# Patient Record
Sex: Male | Born: 1950 | Race: Black or African American | Hispanic: No | Marital: Single | Smoking: Current every day smoker
Health system: Southern US, Community
[De-identification: ages and names within clinical notes are randomized; demographics above are authoritative.]

## PROBLEM LIST (undated history)

## (undated) DIAGNOSIS — I10 Essential (primary) hypertension: Secondary | ICD-10-CM

## (undated) DIAGNOSIS — R195 Other fecal abnormalities: Secondary | ICD-10-CM

## (undated) DIAGNOSIS — F1090 Alcohol use, unspecified, uncomplicated: Secondary | ICD-10-CM

## (undated) DIAGNOSIS — R001 Bradycardia, unspecified: Secondary | ICD-10-CM

## (undated) DIAGNOSIS — K6389 Other specified diseases of intestine: Secondary | ICD-10-CM

## (undated) DIAGNOSIS — I351 Nonrheumatic aortic (valve) insufficiency: Secondary | ICD-10-CM

## (undated) DIAGNOSIS — A159 Respiratory tuberculosis unspecified: Secondary | ICD-10-CM

## (undated) DIAGNOSIS — N189 Chronic kidney disease, unspecified: Secondary | ICD-10-CM

## (undated) DIAGNOSIS — I714 Abdominal aortic aneurysm, without rupture, unspecified: Secondary | ICD-10-CM

## (undated) DIAGNOSIS — Z789 Other specified health status: Secondary | ICD-10-CM

## (undated) HISTORY — DX: Bradycardia, unspecified: R00.1

## (undated) HISTORY — PX: HERNIA REPAIR: SHX51

---

## 2002-03-05 ENCOUNTER — Emergency Department (HOSPITAL_COMMUNITY): Admission: EM | Admit: 2002-03-05 | Discharge: 2002-03-05 | Payer: Self-pay | Admitting: Emergency Medicine

## 2004-02-13 ENCOUNTER — Emergency Department (HOSPITAL_COMMUNITY): Admission: EM | Admit: 2004-02-13 | Discharge: 2004-02-14 | Payer: Self-pay | Admitting: Emergency Medicine

## 2005-11-06 ENCOUNTER — Emergency Department (HOSPITAL_COMMUNITY): Admission: EM | Admit: 2005-11-06 | Discharge: 2005-11-06 | Payer: Self-pay | Admitting: Emergency Medicine

## 2006-10-23 ENCOUNTER — Emergency Department (HOSPITAL_COMMUNITY): Admission: EM | Admit: 2006-10-23 | Discharge: 2006-10-23 | Payer: Self-pay | Admitting: Emergency Medicine

## 2006-10-25 ENCOUNTER — Observation Stay (HOSPITAL_COMMUNITY): Admission: RE | Admit: 2006-10-25 | Discharge: 2006-10-26 | Payer: Self-pay | Admitting: General Surgery

## 2006-10-25 ENCOUNTER — Encounter (INDEPENDENT_AMBULATORY_CARE_PROVIDER_SITE_OTHER): Payer: Self-pay | Admitting: General Surgery

## 2006-10-25 HISTORY — PX: HERNIA REPAIR: SHX51

## 2007-09-11 ENCOUNTER — Emergency Department (HOSPITAL_COMMUNITY): Admission: EM | Admit: 2007-09-11 | Discharge: 2007-09-12 | Payer: Self-pay | Admitting: Emergency Medicine

## 2007-09-17 ENCOUNTER — Emergency Department (HOSPITAL_COMMUNITY): Admission: EM | Admit: 2007-09-17 | Discharge: 2007-09-17 | Payer: Self-pay | Admitting: Emergency Medicine

## 2007-09-20 ENCOUNTER — Ambulatory Visit (HOSPITAL_COMMUNITY): Admission: RE | Admit: 2007-09-20 | Discharge: 2007-09-20 | Payer: Self-pay | Admitting: Family Medicine

## 2007-09-27 ENCOUNTER — Ambulatory Visit (HOSPITAL_COMMUNITY): Admission: RE | Admit: 2007-09-27 | Discharge: 2007-09-27 | Payer: Self-pay | Admitting: Family Medicine

## 2008-05-26 ENCOUNTER — Emergency Department (HOSPITAL_COMMUNITY): Admission: EM | Admit: 2008-05-26 | Discharge: 2008-05-26 | Payer: Self-pay | Admitting: Emergency Medicine

## 2010-06-02 LAB — POCT CARDIAC MARKERS
CKMB, poc: 2.4 ng/mL (ref 1.0–8.0)
Myoglobin, poc: 121 ng/mL (ref 12–200)
Troponin i, poc: <0.05 ng/mL (ref 0.00–0.09)

## 2010-06-02 LAB — HEMOGLOBIN AND HEMATOCRIT, BLOOD
HCT: 44.2 % (ref 39.0–52.0)
Hemoglobin: 15.1 g/dL (ref 13.0–17.0)

## 2010-06-02 LAB — ETHANOL: Alcohol, Ethyl (B): 5 mg/dL (ref 0–10)

## 2010-07-06 NOTE — Op Note (Signed)
NAME:  [name], Fernando Dean NO.:  [id number]   MEDICAL RECORD NO.:  [id number]          PATIENT TYPE:  OBV   LOCATION:  A331                          FACILITY:  [facility]   PHYSICIAN:  Barbaraann Barthel, M.D. DATE OF BIRTH:  January 07, 1951   DATE OF PROCEDURE:  10/25/2006  DATE OF DISCHARGE:                               OPERATIVE REPORT   PREOPERATIVE DIAGNOSIS:  Left inguinal hernia.   POSTOPERATIVE DIAGNOSIS:  Left inguinal hernia.   PROCEDURE:  Left inguinal herniorrhaphy (modified McVay repair).   SPECIMEN:  Left inguinal hernia sac.   NOTE:  This is a 60 year old black male whom I had seen over a year ago  and noted that he had a left inguinal hernia.  I advised surgery.  He  refused at that time and appeared two days ago in the emergency room  with an incarcerated hernia that was reduced by the emergency room  physician.  We then saw him the following day and scheduled surgery for  the following day after that.  We discussed surgery with him in detail.  discussing complications not limited to but including bleeding,  infection, and recurrence, and the possible use of mesh. Informed  consent was obtained.   GROSS OPERATIVE FINDINGS:  The patient had a large left inguinal hernia  sac which was very scarred to the cord structures.  The small bowel was  returned to the abdominal cavity, as he had a previous incarceration.  We noted that this easily slid in and out of his hernia sac.   TECHNIQUE:  The patient was placed in the supine position after the  adequate administration of spinal anesthesia.  Prior to this, a Foley  catheter was aseptically inserted.  Incision was carried out between the  anterior-superior iliac spine and the pubic tubercle, through skin,  subcutaneous tissue, and Scarpa's layer down to the external oblique,  which was opened through the external ring.  The cord structures were  then dissected free through tedious dissection from a very  thickened,  scarred-up hernia sac.  This hernia sac was then doubly ligated with 2-0  Bralon, the redundant portion of which was sent as a specimen.  We then  sutured transversus abdominis and transversalis fascia to Cooper's  ligament and Poupart's ligament with interrupted 2-0 Bralon sutures,  tightening these after relaxing incision was carried out.  We then used  0.5% Sensorcaine to help with postoperative comfort.  We returned the  cord structures to their anatomic position and closed the external  oblique over them with a running 3-0 Polysorb suture.  The subcu was irrigated, and the skin was approximated with a stapling  device.  Prior to closure, all sponge, needle, and instrument counts  were found to be correct.  Estimated blood loss was minimal.  The  patient tolerated the procedure well and was taken to the recovery room  in satisfactory condition.      Barbaraann Barthel, M.D.  Electronically Signed     WB/MEDQ  D:  10/25/2006  T:  10/25/2006  Job:  [id number]

## 2010-12-03 LAB — CBC
HCT: 47.3
Hemoglobin: 15.9
MCHC: 33.6
MCV: 90.7
Platelets: 351
RBC: 5.22
RDW: 13.2
WBC: 8.7

## 2010-12-03 LAB — DIFFERENTIAL
Basophils Absolute: 0
Basophils Relative: 0
Eosinophils Absolute: 0.1
Eosinophils Relative: 1
Lymphocytes Relative: 19
Lymphs Abs: 1.7
Monocytes Absolute: 0.4
Monocytes Relative: 5
Neutro Abs: 6.5
Neutrophils Relative %: 75

## 2010-12-03 LAB — BASIC METABOLIC PANEL
BUN: 12
CO2: 27
Calcium: 10.1
Chloride: 103
Creatinine, Ser: 1.19
GFR calc Af Amer: >60
GFR calc non Af Amer: >60
Glucose, Bld: 130 — ABNORMAL HIGH
Potassium: 4.5
Sodium: 138

## 2011-07-20 ENCOUNTER — Other Ambulatory Visit: Payer: Self-pay | Admitting: Family Medicine

## 2011-07-20 DIAGNOSIS — N200 Calculus of kidney: Secondary | ICD-10-CM

## 2011-07-21 ENCOUNTER — Ambulatory Visit (HOSPITAL_COMMUNITY)
Admission: RE | Admit: 2011-07-21 | Discharge: 2011-07-21 | Disposition: A | Discharge status: Home / Self Care | Payer: Medicare Other | Source: Ambulatory Visit | Attending: Family Medicine | Admitting: Family Medicine

## 2011-07-21 DIAGNOSIS — N200 Calculus of kidney: Secondary | ICD-10-CM

## 2011-07-21 DIAGNOSIS — R1032 Left lower quadrant pain: Secondary | ICD-10-CM | POA: Insufficient documentation

## 2011-07-21 DIAGNOSIS — R3916 Straining to void: Secondary | ICD-10-CM | POA: Insufficient documentation

## 2011-10-11 ENCOUNTER — Ambulatory Visit: Payer: Medicare Other | Admitting: Urology

## 2020-10-14 ENCOUNTER — Ambulatory Visit: Payer: Self-pay | Admitting: Family Medicine

## 2020-12-24 DIAGNOSIS — R001 Bradycardia, unspecified: Secondary | ICD-10-CM | POA: Diagnosis not present

## 2020-12-24 DIAGNOSIS — Z0189 Encounter for other specified special examinations: Secondary | ICD-10-CM | POA: Diagnosis not present

## 2020-12-24 DIAGNOSIS — Z1211 Encounter for screening for malignant neoplasm of colon: Secondary | ICD-10-CM | POA: Diagnosis not present

## 2020-12-28 ENCOUNTER — Encounter (INDEPENDENT_AMBULATORY_CARE_PROVIDER_SITE_OTHER): Payer: Self-pay | Admitting: *Deleted

## 2021-01-11 ENCOUNTER — Encounter (INDEPENDENT_AMBULATORY_CARE_PROVIDER_SITE_OTHER): Payer: Self-pay | Admitting: *Deleted

## 2021-02-01 ENCOUNTER — Ambulatory Visit (INDEPENDENT_AMBULATORY_CARE_PROVIDER_SITE_OTHER): Payer: Medicare Other | Admitting: Gastroenterology

## 2021-02-01 ENCOUNTER — Other Ambulatory Visit: Payer: Self-pay

## 2021-02-01 ENCOUNTER — Encounter (INDEPENDENT_AMBULATORY_CARE_PROVIDER_SITE_OTHER): Payer: Self-pay | Admitting: Gastroenterology

## 2021-02-01 DIAGNOSIS — R195 Other fecal abnormalities: Secondary | ICD-10-CM

## 2021-02-01 DIAGNOSIS — Z136 Encounter for screening for cardiovascular disorders: Secondary | ICD-10-CM | POA: Insufficient documentation

## 2021-02-01 DIAGNOSIS — I10 Essential (primary) hypertension: Secondary | ICD-10-CM | POA: Insufficient documentation

## 2021-02-01 DIAGNOSIS — R001 Bradycardia, unspecified: Secondary | ICD-10-CM

## 2021-02-01 NOTE — Patient Instructions (Signed)
Cardiology referral for colonoscopy clearance given bradycardia, SOB and hypertension Patient will need to reach PCP due to uncontrolled hypertension - may need to be seen in the ED if presenting persistent headache, chest pain, changes in vision, worsening shortness of breath Schedule aortic US Perform blood workup

## 2021-02-01 NOTE — Progress Notes (Signed)
Fernando Dean, M.D. Gastroenterology & Hepatology [facility] [address] [place], [place] [address] Primary Care Physician: [name], FNP [place] [address]  Referring MD: PCP  Chief Complaint:  Positive FOBT  History of Present Illness: Fernando Dean is a 70 y.o. male with past medical history of chronic tobacco and alcohol abuse, who presents for evaluation of positive fecal occult blood test.  Patient reports that around June 2022 he had positive FOBT at his PCP office and was referred for further evaluation.  He denies having any complaints and states feeling well.  Denies having any episodes of melena, hematochezia, abdominal pain, nausea, vomiting, fever, chills, abdominal distention or changes in his weight.   Notably, he has been told his HR is low recently.  Reports that occasionally he has shortness of breath when he is lifting objects. Has presented some intermittent episodes of dizziness.  No chest pain or syncope.  Currently not taking any medications for HTN.  Last EGD: never Last Colonoscopy:never  FHx: neg for any gastrointestinal/liver disease, motherhad gallbladder cancer Social: smokes 1 pack a day, drinks 3-4 shots of vodka every day, smokes marijuana 2-3 times a week Surgical: hernia repair  Past Medical History: Past Medical History:  Diagnosis Date   Bradycardia     Past Surgical History: Past Surgical History:  Procedure Laterality Date   HERNIA REPAIR     2012    Family History: Family History  Problem Relation Age of Onset   Bladder Cancer Mother    Heart disease Father     Social History: Social History   Tobacco Use  Smoking Status Every Day   Packs/day: 1.00   Types: Cigarettes  Smokeless Tobacco Never   Social History   Substance and Sexual Activity  Alcohol Use Yes   Comment: daily liquor moderatly   Social History   Substance and Sexual  Activity  Drug Use Not on file    Allergies: Allergies  Allergen Reactions   Advil [Ibuprofen] Shortness Of Breath and Palpitations   Aleve [Naproxen] Shortness Of Breath   Nsaids Shortness Of Breath    Medications: Current Outpatient Medications  Medication Sig Dispense Refill   Multiple Vitamins-Minerals (MULTIVITAMIN WITH MINERALS) tablet Take 1 tablet by mouth daily.     No current facility-administered medications for this visit.    Review of Systems: GENERAL: negative for malaise, night sweats HEENT: No changes in hearing or vision, no nose bleeds or other nasal problems. NECK: Negative for lumps, goiter, pain and significant neck swelling RESPIRATORY: Negative for cough, wheezing CARDIOVASCULAR: Negative for chest pain, leg swelling, palpitations, orthopnea GI: SEE HPI MUSCULOSKELETAL: Negative for joint pain or swelling, back pain, and muscle pain. SKIN: Negative for lesions, rash PSYCH: Negative for sleep disturbance, mood disorder and recent psychosocial stressors. HEMATOLOGY Negative for prolonged bleeding, bruising easily, and swollen nodes. ENDOCRINE: Negative for cold or heat intolerance, polyuria, polydipsia and goiter. NEURO: negative for tremor, gait imbalance, syncope and seizures. The remainder of the review of systems is noncontributory.   Physical Exam: BP (!) 193/77 (BP Location: Right Arm, Patient Position: Sitting, Cuff Size: Small)   Pulse (!) 37   Temp 97.6 F (36.4 C) (Oral)   Ht 5\' 11"  (1.803 m)   Wt 167 lb 9.6 oz (76 kg)   BMI 23.38 kg/m  GENERAL: The patient is AO x3, in no acute distress. HEENT: Head is normocephalic and atraumatic. EOMI are intact. Mouth is  well hydrated and without lesions. NECK: Supple. No masses LUNGS: Clear to auscultation. No presence of rhonchi/wheezing/rales. Adequate chest expansion HEART: RRR, normal s1 and s2. ABDOMEN: Soft, nontender, no guarding, no peritoneal signs, and nondistended. BS +. Visible  pulsating area in mid abdomen, likely aorta. EXTREMITIES: Without any cyanosis, clubbing, rash, lesions or edema. NEUROLOGIC: AOx3, no focal motor deficit. SKIN: no jaundice, no rashes   Imaging/Labs: as above  I personally reviewed and interpreted the available labs, imaging and endoscopic files.  Impression and Plan: Fernando Dean is a 70 y.o. male with past medical history of chronic tobacco and alcohol abuse, who presents for evaluation of positive fecal occult blood test.  The patient has not presented any gastrointestinal symptoms but was found to have positive fecal occult blood test recently and has never had a colonoscopy in the past.  Even though he has several risk factors for colorectal cancer he has never had a colonoscopy, given his significant bradycardia and uncontrolled hypertension, along with episode of shortness of breath upon exertion, I would like him to be evaluated by a cardiologist first to obtain clearance prior to scheduling a colonoscopy.  He will also need to follow-up with his PCP to control his hypertension but if he presents any alarm symptoms, he should go to the ER to have acute treatment for this.  For now, we will check CBC.  He was incidentally found to have a pulsating mass in his abdomen which is likely his aorta.  Given his history of chronic smoking we will schedule an abdominal ultrasound to evaluate for possible AAA.  -Cardiology referral for colonoscopy clearance given bradycardia, SOB and hypertension -Patient will need to reach PCP due to uncontrolled hypertension - may need to be seen in the ED if presenting persistent headache, chest pain, changes in vision, worsening shortness of breath -Schedule aortic US -Check CBC  All questions were answered.      Fernando Peppers, MD Gastroenterology and Hepatology [facility]

## 2021-02-04 ENCOUNTER — Inpatient Hospital Stay (HOSPITAL_COMMUNITY)
Admission: EM | Admit: 2021-02-04 | Discharge: 2021-02-09 | DRG: 244 | Disposition: A | Discharge status: Home / Self Care | Payer: Medicare Other | Attending: Internal Medicine | Admitting: Internal Medicine

## 2021-02-04 ENCOUNTER — Encounter (HOSPITAL_COMMUNITY): Payer: Self-pay

## 2021-02-04 ENCOUNTER — Emergency Department (HOSPITAL_COMMUNITY): Payer: Medicare Other

## 2021-02-04 ENCOUNTER — Other Ambulatory Visit: Payer: Self-pay

## 2021-02-04 DIAGNOSIS — Z959 Presence of cardiac and vascular implant and graft, unspecified: Secondary | ICD-10-CM

## 2021-02-04 DIAGNOSIS — I7142 Juxtarenal abdominal aortic aneurysm, without rupture: Secondary | ICD-10-CM | POA: Diagnosis not present

## 2021-02-04 DIAGNOSIS — D6489 Other specified anemias: Secondary | ICD-10-CM

## 2021-02-04 DIAGNOSIS — J449 Chronic obstructive pulmonary disease, unspecified: Secondary | ICD-10-CM | POA: Diagnosis not present

## 2021-02-04 DIAGNOSIS — F17220 Nicotine dependence, chewing tobacco, uncomplicated: Secondary | ICD-10-CM

## 2021-02-04 DIAGNOSIS — Z8249 Family history of ischemic heart disease and other diseases of the circulatory system: Secondary | ICD-10-CM | POA: Diagnosis not present

## 2021-02-04 DIAGNOSIS — F17210 Nicotine dependence, cigarettes, uncomplicated: Secondary | ICD-10-CM | POA: Diagnosis not present

## 2021-02-04 DIAGNOSIS — N183 Chronic kidney disease, stage 3 unspecified: Secondary | ICD-10-CM | POA: Diagnosis present

## 2021-02-04 DIAGNOSIS — R195 Other fecal abnormalities: Secondary | ICD-10-CM | POA: Diagnosis not present

## 2021-02-04 DIAGNOSIS — Z886 Allergy status to analgesic agent status: Secondary | ICD-10-CM | POA: Diagnosis not present

## 2021-02-04 DIAGNOSIS — I452 Bifascicular block: Secondary | ICD-10-CM | POA: Diagnosis present

## 2021-02-04 DIAGNOSIS — I7143 Infrarenal abdominal aortic aneurysm, without rupture: Secondary | ICD-10-CM | POA: Diagnosis present

## 2021-02-04 DIAGNOSIS — I442 Atrioventricular block, complete: Principal | ICD-10-CM | POA: Diagnosis present

## 2021-02-04 DIAGNOSIS — Z20822 Contact with and (suspected) exposure to covid-19: Secondary | ICD-10-CM | POA: Diagnosis not present

## 2021-02-04 DIAGNOSIS — Z950 Presence of cardiac pacemaker: Secondary | ICD-10-CM | POA: Diagnosis not present

## 2021-02-04 DIAGNOSIS — R001 Bradycardia, unspecified: Secondary | ICD-10-CM | POA: Diagnosis present

## 2021-02-04 DIAGNOSIS — R531 Weakness: Secondary | ICD-10-CM | POA: Diagnosis not present

## 2021-02-04 DIAGNOSIS — I272 Pulmonary hypertension, unspecified: Secondary | ICD-10-CM | POA: Diagnosis present

## 2021-02-04 DIAGNOSIS — N261 Atrophy of kidney (terminal): Secondary | ICD-10-CM | POA: Diagnosis not present

## 2021-02-04 DIAGNOSIS — N1832 Chronic kidney disease, stage 3b: Secondary | ICD-10-CM | POA: Diagnosis present

## 2021-02-04 DIAGNOSIS — N281 Cyst of kidney, acquired: Secondary | ICD-10-CM | POA: Diagnosis not present

## 2021-02-04 DIAGNOSIS — I129 Hypertensive chronic kidney disease with stage 1 through stage 4 chronic kidney disease, or unspecified chronic kidney disease: Secondary | ICD-10-CM | POA: Diagnosis present

## 2021-02-04 DIAGNOSIS — Z720 Tobacco use: Secondary | ICD-10-CM | POA: Diagnosis present

## 2021-02-04 DIAGNOSIS — F1010 Alcohol abuse, uncomplicated: Secondary | ICD-10-CM | POA: Diagnosis present

## 2021-02-04 DIAGNOSIS — I10 Essential (primary) hypertension: Secondary | ICD-10-CM | POA: Diagnosis not present

## 2021-02-04 DIAGNOSIS — D631 Anemia in chronic kidney disease: Secondary | ICD-10-CM | POA: Diagnosis not present

## 2021-02-04 DIAGNOSIS — I459 Conduction disorder, unspecified: Secondary | ICD-10-CM | POA: Diagnosis present

## 2021-02-04 DIAGNOSIS — R0609 Other forms of dyspnea: Secondary | ICD-10-CM | POA: Diagnosis not present

## 2021-02-04 DIAGNOSIS — I700 Atherosclerosis of aorta: Secondary | ICD-10-CM | POA: Diagnosis not present

## 2021-02-04 HISTORY — DX: Essential (primary) hypertension: I10

## 2021-02-04 HISTORY — DX: Other fecal abnormalities: R19.5

## 2021-02-04 LAB — COMPREHENSIVE METABOLIC PANEL
ALT: 12 U/L (ref 0–44)
AST: 22 U/L (ref 15–41)
Albumin: 3.5 g/dL (ref 3.5–5.0)
Alkaline Phosphatase: 133 U/L — ABNORMAL HIGH (ref 38–126)
Anion gap: 11 (ref 5–15)
BUN: 25 mg/dL — ABNORMAL HIGH (ref 8–23)
CO2: 19 mmol/L — ABNORMAL LOW (ref 22–32)
Calcium: 9.2 mg/dL (ref 8.9–10.3)
Chloride: 107 mmol/L (ref 98–111)
Creatinine, Ser: 1.86 mg/dL — ABNORMAL HIGH (ref 0.61–1.24)
GFR, Estimated: 38 mL/min — ABNORMAL LOW (ref >60)
Glucose, Bld: 106 mg/dL — ABNORMAL HIGH (ref 70–99)
Potassium: 4.6 mmol/L (ref 3.5–5.1)
Sodium: 137 mmol/L (ref 135–145)
Total Bilirubin: 1 mg/dL (ref 0.3–1.2)
Total Protein: 8.4 g/dL — ABNORMAL HIGH (ref 6.5–8.1)

## 2021-02-04 LAB — CBC WITH DIFFERENTIAL/PLATELET
Abs Immature Granulocytes: 0.03 10*3/uL (ref 0.00–0.07)
Basophils Absolute: 0.1 10*3/uL (ref 0.0–0.1)
Basophils Relative: 1 %
Eosinophils Absolute: 0.5 10*3/uL (ref 0.0–0.5)
Eosinophils Relative: 4 %
HCT: 38.4 % — ABNORMAL LOW (ref 39.0–52.0)
Hemoglobin: 11.7 g/dL — ABNORMAL LOW (ref 13.0–17.0)
Immature Granulocytes: 0 %
Lymphocytes Relative: 26 %
Lymphs Abs: 2.9 10*3/uL (ref 0.7–4.0)
MCH: 26.5 pg (ref 26.0–34.0)
MCHC: 30.5 g/dL (ref 30.0–36.0)
MCV: 87.1 fL (ref 80.0–100.0)
Monocytes Absolute: 1.5 10*3/uL — ABNORMAL HIGH (ref 0.1–1.0)
Monocytes Relative: 13 %
Neutro Abs: 6.2 10*3/uL (ref 1.7–7.7)
Neutrophils Relative %: 56 %
Platelets: 228 10*3/uL (ref 150–400)
RBC: 4.41 MIL/uL (ref 4.22–5.81)
RDW: 16.3 % — ABNORMAL HIGH (ref 11.5–15.5)
WBC: 11.2 10*3/uL — ABNORMAL HIGH (ref 4.0–10.5)
nRBC: 0 % (ref 0.0–0.2)

## 2021-02-04 LAB — RESP PANEL BY RT-PCR (FLU A&B, COVID) ARPGX2
Influenza A by PCR: NEGATIVE
Influenza B by PCR: NEGATIVE
SARS Coronavirus 2 by RT PCR: NEGATIVE

## 2021-02-04 LAB — TROPONIN I (HIGH SENSITIVITY): Troponin I (High Sensitivity): 36 ng/L — ABNORMAL HIGH (ref <18)

## 2021-02-04 LAB — MAGNESIUM: Magnesium: 1.8 mg/dL (ref 1.7–2.4)

## 2021-02-04 LAB — TSH: TSH: 1.006 u[IU]/mL (ref 0.350–4.500)

## 2021-02-04 MED ORDER — DIAZEPAM 5 MG PO TABS
2.5000 mg | ORAL_TABLET | Freq: Three times a day (TID) | ORAL | Status: AC
  Administered 2021-02-04 – 2021-02-06 (×5): 2.5 mg via ORAL
  Filled 2021-02-04 (×5): qty 1

## 2021-02-04 MED ORDER — ACETAMINOPHEN 325 MG PO TABS: 650.0000 mg | ORAL_TABLET | Freq: Four times a day (QID) | ORAL | Status: DC | PRN

## 2021-02-04 MED ORDER — SODIUM CHLORIDE 0.9% FLUSH
3.0000 mL | Freq: Two times a day (BID) | INTRAVENOUS | Status: DC
  Administered 2021-02-05 – 2021-02-09 (×3): 3 mL via INTRAVENOUS

## 2021-02-04 MED ORDER — TRAZODONE HCL 50 MG PO TABS
50.0000 mg | ORAL_TABLET | Freq: Every evening | ORAL | Status: DC | PRN
  Administered 2021-02-06 – 2021-02-07 (×2): 50 mg via ORAL
  Filled 2021-02-04 (×2): qty 1

## 2021-02-04 MED ORDER — SODIUM CHLORIDE 0.9 % IV SOLN: 250.0000 mL | INTRAVENOUS | Status: DC | PRN

## 2021-02-04 MED ORDER — ONDANSETRON HCL 4 MG/2ML IJ SOLN: 4.0000 mg | Freq: Four times a day (QID) | INTRAMUSCULAR | Status: DC | PRN

## 2021-02-04 MED ORDER — ADULT MULTIVITAMIN W/MINERALS CH
1.0000 | ORAL_TABLET | Freq: Every day | ORAL | Status: DC
  Administered 2021-02-04 – 2021-02-09 (×6): 1 via ORAL
  Filled 2021-02-04 (×6): qty 1

## 2021-02-04 MED ORDER — LORAZEPAM 2 MG/ML IJ SOLN: 1.0000 mg | INTRAMUSCULAR | Status: AC | PRN

## 2021-02-04 MED ORDER — THIAMINE HCL 100 MG/ML IJ SOLN
100.0000 mg | Freq: Every day | INTRAMUSCULAR | Status: DC
  Administered 2021-02-04: 100 mg via INTRAVENOUS
  Filled 2021-02-04 (×2): qty 2

## 2021-02-04 MED ORDER — ALBUTEROL SULFATE (2.5 MG/3ML) 0.083% IN NEBU: 2.5000 mg | INHALATION_SOLUTION | RESPIRATORY_TRACT | Status: DC | PRN

## 2021-02-04 MED ORDER — ONDANSETRON HCL 4 MG PO TABS: 4.0000 mg | ORAL_TABLET | Freq: Four times a day (QID) | ORAL | Status: DC | PRN

## 2021-02-04 MED ORDER — THIAMINE HCL 100 MG PO TABS
100.0000 mg | ORAL_TABLET | Freq: Every day | ORAL | Status: DC
  Administered 2021-02-05 – 2021-02-09 (×5): 100 mg via ORAL
  Filled 2021-02-04 (×6): qty 1

## 2021-02-04 MED ORDER — HEPARIN SODIUM (PORCINE) 5000 UNIT/ML IJ SOLN
5000.0000 [IU] | Freq: Three times a day (TID) | INTRAMUSCULAR | Status: DC
  Administered 2021-02-04 – 2021-02-07 (×10): 5000 [IU] via SUBCUTANEOUS
  Filled 2021-02-04 (×11): qty 1

## 2021-02-04 MED ORDER — SODIUM CHLORIDE 0.9% FLUSH
3.0000 mL | Freq: Two times a day (BID) | INTRAVENOUS | Status: DC
  Administered 2021-02-05 – 2021-02-09 (×8): 3 mL via INTRAVENOUS

## 2021-02-04 MED ORDER — POLYETHYLENE GLYCOL 3350 17 G PO PACK: 17.0000 g | PACK | Freq: Every day | ORAL | Status: DC | PRN

## 2021-02-04 MED ORDER — SODIUM CHLORIDE 0.9% FLUSH: 3.0000 mL | INTRAVENOUS | Status: DC | PRN

## 2021-02-04 MED ORDER — NICOTINE 21 MG/24HR TD PT24
21.0000 mg | MEDICATED_PATCH | Freq: Every day | TRANSDERMAL | Status: DC
  Administered 2021-02-04 – 2021-02-09 (×6): 21 mg via TRANSDERMAL
  Filled 2021-02-04 (×6): qty 1

## 2021-02-04 MED ORDER — AMLODIPINE BESYLATE 10 MG PO TABS
10.0000 mg | ORAL_TABLET | Freq: Every day | ORAL | Status: DC
  Administered 2021-02-04 – 2021-02-09 (×6): 10 mg via ORAL
  Filled 2021-02-04: qty 2
  Filled 2021-02-04 (×5): qty 1

## 2021-02-04 MED ORDER — ACETAMINOPHEN 650 MG RE SUPP: 650.0000 mg | Freq: Four times a day (QID) | RECTAL | Status: DC | PRN

## 2021-02-04 MED ORDER — IOHEXOL 350 MG/ML SOLN
80.0000 mL | Freq: Once | INTRAVENOUS | Status: AC | PRN
  Administered 2021-02-04: 80 mL via INTRAVENOUS

## 2021-02-04 MED ORDER — LORAZEPAM 1 MG PO TABS: 1.0000 mg | ORAL_TABLET | ORAL | Status: AC | PRN

## 2021-02-04 MED ORDER — HYDRALAZINE HCL 20 MG/ML IJ SOLN
5.0000 mg | Freq: Once | INTRAMUSCULAR | Status: AC
  Administered 2021-02-04: 5 mg via INTRAVENOUS
  Filled 2021-02-04: qty 1

## 2021-02-04 MED ORDER — UMECLIDINIUM-VILANTEROL 62.5-25 MCG/ACT IN AEPB
1.0000 | INHALATION_SPRAY | Freq: Every day | RESPIRATORY_TRACT | Status: DC
  Administered 2021-02-05 – 2021-02-09 (×4): 1 via RESPIRATORY_TRACT
  Filled 2021-02-04: qty 14

## 2021-02-04 MED ORDER — HYDRALAZINE HCL 20 MG/ML IJ SOLN: 10.0000 mg | Freq: Four times a day (QID) | INTRAMUSCULAR | Status: DC | PRN

## 2021-02-04 MED ORDER — BISACODYL 10 MG RE SUPP: 10.0000 mg | Freq: Every day | RECTAL | Status: DC | PRN

## 2021-02-04 MED ORDER — LORAZEPAM 2 MG/ML IJ SOLN
1.0000 mg | Freq: Once | INTRAMUSCULAR | Status: AC
  Administered 2021-02-04: 1 mg via INTRAVENOUS
  Filled 2021-02-04: qty 1

## 2021-02-04 MED ORDER — FOLIC ACID 1 MG PO TABS
1.0000 mg | ORAL_TABLET | Freq: Every day | ORAL | Status: DC
  Administered 2021-02-04 – 2021-02-09 (×6): 1 mg via ORAL
  Filled 2021-02-04 (×6): qty 1

## 2021-02-04 NOTE — H&P (Addendum)
Patient Demographics:    Fernando Dean, is a 70 y.o. male  MRN: [id number]   DOB - Sep 07, 1950  Admit Date - 02/04/2021  Outpatient Primary MD for the patient is [name], FNP   Assessment & Plan:    Principal Problem:   Bradycardia/Complete Heart Block Active Problems:   Alcohol abuse   Tobacco abuse   CKD (chronic kidney disease), stage IIIB (HCC)   Essential hypertension   Heart block    1)Bradycardia/Complete Heart Block -General cardiology consult appreciated HR  high 30s and low 40s --EKG shows complete heart block as well as RBBB and block and LAFB TSH 1.0, potassium 4.6, magnesium pending -Troponin pending -patient needs EP consultation at [facility] Provider to consult EP cardiology when patient arrives at [facility] on 02/05/2021 -Obviously avoid negative chronotropic agents -May use atropine or glucagon if becomes more symptomatic -Patient denies syncope, admits to some dizziness, no chest pains ,has some dyspnea on exertion but no dyspnea at rest -Echo pending  2) alcohol abuse----patient drinks at least 1 bottle of vodka each day along with beer -High risk for DTs -Scheduled Valium as prescribed -Lorazepam per CIWA protocol along with multivitamin thiamine and folic acid  3) tobacco abuse--smoking cessation advised nicotine patch as prescribed  4) CKD stage -3B -No recent creatinine or renal labs available -Suspect patient probably have stage IIIb CKD at baseline given underlying longstanding hypertension - renally adjust medications, avoid nephrotoxic agents / dehydration  / hypotension  5)HTN--stage II, okay to start amlodipine 10 mg daily  may use IV Hydralazine 10 mg  Every 4 hours Prn for systolic blood pressure over 160 mmhg -Obviously avoid negative  chronotropic agents avoid ACEI/ARB/ARNI due to renal concerns   6)Heme +ve Stool--with mild anemia-  hemoglobin 11.7, currently evaluated by Dr. Jenetta Downer from GI service plans for GI evaluation once bradycardia has been addressed  7) COPD with pulmonary hypertension suspect some degree of cor pulmonale-- -- Bronchodilators as advised hold off on steroid  8)AAA-CT abdomen and pelvis showed  infrarenal abdominal aortic aneurysm with significant mural thrombus measuring 5.8 x 5.4 cm, no evidence of rupture or dissection-- --EDP spoke with on-call vascular surgeon who advised outpatient vascular surgery follow-up   Disposition/Need for in-Hospital Stay- patient unable to be discharged at this time due to persistent severe bradycardia requiring further EP evaluation,   Status is: Inpatient  Remains inpatient appropriate because: See disposition above  Dispo: The patient is from: Home              Anticipated d/c is to: Home              Anticipated d/c date is: 2 days              Patient currently is not medically stable to d/c. Barriers: Not Clinically Stable-    With History of - Reviewed by me  Past Medical History:  Diagnosis Date  Heme positive stool    Hypertension       Past Surgical History:  Procedure Laterality Date   HERNIA REPAIR     2012      Chief Complaint  Patient presents with   Hypertension      HPI:    Fernando Dean  is a 70 y.o. male   with a history of hypertension, tobacco abuse, and aortic atherosclerosis with borderline aneurysmal dilatation of the infrarenal abdominal aorta at 3.2 cm as of 2013 , ongoing alcohol abuse, recent heme positive stool sent over from GI clinic  to the ED for further evaluation of elevated BP and bradycardia  -In ED patient is found to have heart rate in the high 30s and low 40s -EKG shows complete heart block as well as RBBB and block and LAFB - Additional history obtained from patient's sister at  bedside----Patient denies syncope, admits to some dizziness, no chest pains ,has some dyspnea on exertion but no dyspnea at rest   CTA of the chest abdomen and pelvis obtained per ER staff shows infrarenal abdominal aortic aneurysm with significant mural thrombus measuring 5.8 x 5.4 cm, no evidence of rupture or dissection.  Thoracic aorta described as normal in caliber and without dissection.  Pulmonary artery is enlarged suggestive of pulmonary hypertension and he also has emphysematous changes with bronchial thickening, cardiomegaly, and coronary artery calcifications. -Creatinine is 1.86, no recent baseline -WBCs 11.2 , hemoglobin 11.7, platelets 228 -TSH 1.0, potassium 4.6, magnesium pending   Review of systems:    In addition to the HPI above,   A full Review of  Systems was done, all other systems reviewed are negative except as noted above in HPI , .    Social History:  Reviewed by me    Social History   Tobacco Use   Smoking status: Every Day    Packs/day: 1.00    Types: Cigarettes   Smokeless tobacco: Never  Substance Use Topics   Alcohol use: Yes    Comment: daily liquor moderatly       Family History :  Reviewed by me    Family History  Problem Relation Age of Onset   Bladder Cancer Mother    Heart disease Father     Home Medications:   Prior to Admission medications   Medication Sig Start Date End Date Taking? Authorizing Provider  Multiple Vitamins-Minerals (MULTIVITAMIN WITH MINERALS) tablet Take 1 tablet by mouth daily.   Yes [provider]     Allergies:     Allergies  Allergen Reactions   Advil [Ibuprofen] Shortness Of Breath and Palpitations   Aleve [Naproxen] Shortness Of Breath   Nsaids Shortness Of Breath     Physical Exam:   Vitals  Blood pressure (!) 174/72, pulse 64, temperature 97.7 F (36.5 C), temperature source Oral, resp. rate 20, height 5\' 11"  (1.803 m), weight 75.8 kg, SpO2 92 %.  Physical Examination: General  appearance - alert,  and in no distress  Mental status - alert, oriented to person, place, and time,  Eyes - sclera anicteric Neck - supple, no JVD elevation , Chest - clear  to auscultation bilaterally, symmetrical air movement,  Heart - S1 and S2 normal, regular, HR around 40 Abdomen - soft, nontender, nondistended, no masses or organomegaly Neurological - screening mental status exam normal, neck supple without rigidity, cranial nerves II through XII intact, DTR's normal and symmetric Extremities - no pedal edema noted, intact peripheral pulses  Skin - warm,  dry   Data Review:    CBC Recent Labs  Lab 02/04/21 1228  WBC 11.2*  HGB 11.7*  HCT 38.4*  PLT 228  MCV 87.1  MCH 26.5  MCHC 30.5  RDW 16.3*  LYMPHSABS 2.9  MONOABS 1.5*  EOSABS 0.5  BASOSABS 0.1   ------------------------------------------------------------------------------------------------------------------  Chemistries  Recent Labs  Lab 02/04/21 1228  NA 137  K 4.6  CL 107  CO2 19*  GLUCOSE 106*  BUN 25*  CREATININE 1.86*  CALCIUM 9.2  AST 22  ALT 12  ALKPHOS 133*  BILITOT 1.0   ------------------------------------------------------------------------------------------------------------------ estimated creatinine clearance is 39.4 mL/min (A) (by C-G formula based on SCr of 1.86 mg/dL (H)). ------------------------------------------------------------------------------------------------------------------ Recent Labs    02/04/21 1228  TSH 1.006     Coagulation profile No results for input(s): INR, PROTIME in the last 168 hours. ------------------------------------------------------------------------------------------------------------------- No results for input(s): DDIMER in the last 72 hours. -------------------------------------------------------------------------------------------------------------------  Cardiac Enzymes No results for input(s): CKMB, TROPONINI, MYOGLOBIN in the last 168  hours.  Invalid input(s): CK ------------------------------------------------------------------------------------------------------------------ No results found for: BNP   Urinalysis No results found for: COLORURINE, APPEARANCEUR, LABSPEC, PHURINE, GLUCOSEU, HGBUR, BILIRUBINUR, KETONESUR, PROTEINUR, UROBILINOGEN, NITRITE, LEUKOCYTESUR  ----------------------------------------------------------------------------------------------------------------   Imaging Results:    DG Chest Port 1 View  Result Date: 02/04/2021 CLINICAL DATA:  Weak EXAM: PORTABLE CHEST 1 VIEW COMPARISON:  02/14/2004 FINDINGS: The heart size and mediastinal contours are within normal limits. Both lungs are clear. The visualized skeletal structures are unremarkable. IMPRESSION: No acute abnormality of the lungs in AP portable projection. Electronically Signed   By: Delanna Ahmadi M.D.   On: 02/04/2021 13:18   CT Angio Chest/Abd/Pel for Dissection W and/or Wo Contrast  Result Date: 02/04/2021 CLINICAL DATA:  Aortic aneurysm EXAM: CT ANGIOGRAPHY CHEST, ABDOMEN AND PELVIS TECHNIQUE: Non-contrast CT of the chest was initially obtained. Multidetector CT imaging through the chest, abdomen and pelvis was performed using the standard protocol during bolus administration of intravenous contrast. Multiplanar reconstructed images and MIPs were obtained and reviewed to evaluate the vascular anatomy. CONTRAST:  94mL OMNIPAQUE IOHEXOL 350 MG/ML SOLN COMPARISON:  07/21/2011 FINDINGS: CTA CHEST FINDINGS Cardiovascular: Preferential opacification of the thoracic aorta. Normal contour and caliber of the thoracic aorta without evidence of aneurysm, dissection, or other acute aortic pathology. Mild cardiomegaly. Left and right coronary artery calcifications. No pericardial effusion. Enlargement of the main pulmonary artery measuring up to 4.2 cm. Moderate mixed aortic atherosclerosis. Mediastinum/Nodes: No enlarged mediastinal, hilar, or axillary  lymph nodes. Thyroid gland, trachea, and esophagus demonstrate no significant findings. Lungs/Pleura: Moderate to severe centrilobular emphysema. Diffuse bilateral bronchial wall thickening. No pleural effusion or pneumothorax. Musculoskeletal: No chest wall abnormality. No acute or significant osseous findings. Review of the MIP images confirms the above findings. 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 No CT evidence of acute rupture or other acute aortic pathology. Standard branching pattern of the abdominal aorta with solitary bilateral renal arteries. The branch vessel origins are patent and opacified. Review of the MIP images confirms the above findings. NON-VASCULAR Hepatobiliary: No solid liver abnormality is seen. No gallstones, gallbladder wall thickening, or biliary dilatation. Pancreas: Unremarkable. No pancreatic ductal dilatation or surrounding inflammatory changes. Spleen: Normal in size without significant abnormality. Adrenals/Urinary Tract: Adrenal glands are unremarkable. Severely atrophic left kidney with diminished nephrogram. Multifocal cortical scarring of the right kidney. Multiple  small right renal cysts. No hydronephrosis. Bladder is unremarkable. Stomach/Bowel: Stomach is within normal limits. Appendix appears normal. No evidence of bowel wall thickening, distention, or inflammatory changes. Colonic diverticula. Lymphatic: No enlarged abdominal or pelvic lymph nodes. Reproductive: No mass or other significant abnormality. Other: No abdominal wall hernia or abnormality. No abdominopelvic ascites. Musculoskeletal: No acute or significant osseous findings. Review of the MIP images confirms the above findings. IMPRESSION: 1. There is a fusiform aneurysm of the infrarenal abdominal aorta with a large burden of mural thrombus,  largest component measuring 5.8 x 5.4 cm and substantially enlarged in comparison to remote prior examination dated 07/21/2011. No CT evidence of acute rupture or other acute aortic pathology. Recommend referral to a vascular specialist. This recommendation follows ACR consensus guidelines: White Paper of the ACR Incidental Findings Committee II on Vascular Findings. J Am Coll Radiol 2013; 10:789-794. 2. Normal contour and caliber of the thoracic aorta. No evidence of thoracic aortic aneurysm, dissection, or other acute aortic pathology. 3. Enlargement of the main pulmonary artery, as can be seen in pulmonary hypertension. 4. Emphysema and diffuse bilateral bronchial wall thickening. 5. Cardiomegaly and coronary artery disease. Aortic Atherosclerosis (ICD10-I70.0) and Emphysema (ICD10-J43.9). Electronically Signed   By: Delanna Ahmadi M.D.   On: 02/04/2021 14:29    Radiological Exams on Admission: DG Chest Port 1 View  Result Date: 02/04/2021 CLINICAL DATA:  Weak EXAM: PORTABLE CHEST 1 VIEW COMPARISON:  02/14/2004 FINDINGS: The heart size and mediastinal contours are within normal limits. Both lungs are clear. The visualized skeletal structures are unremarkable. IMPRESSION: No acute abnormality of the lungs in AP portable projection. Electronically Signed   By: Delanna Ahmadi M.D.   On: 02/04/2021 13:18   CT Angio Chest/Abd/Pel for Dissection W and/or Wo Contrast  Result Date: 02/04/2021 CLINICAL DATA:  Aortic aneurysm EXAM: CT ANGIOGRAPHY CHEST, ABDOMEN AND PELVIS TECHNIQUE: Non-contrast CT of the chest was initially obtained. Multidetector CT imaging through the chest, abdomen and pelvis was performed using the standard protocol during bolus administration of intravenous contrast. Multiplanar reconstructed images and MIPs were obtained and reviewed to evaluate the vascular anatomy. CONTRAST:  84mL OMNIPAQUE IOHEXOL 350 MG/ML SOLN COMPARISON:  07/21/2011 FINDINGS: CTA CHEST FINDINGS Cardiovascular:  Preferential opacification of the thoracic aorta. Normal contour and caliber of the thoracic aorta without evidence of aneurysm, dissection, or other acute aortic pathology. Mild cardiomegaly. Left and right coronary artery calcifications. No pericardial effusion. Enlargement of the main pulmonary artery measuring up to 4.2 cm. Moderate mixed aortic atherosclerosis. Mediastinum/Nodes: No enlarged mediastinal, hilar, or axillary lymph nodes. Thyroid gland, trachea, and esophagus demonstrate no significant findings. Lungs/Pleura: Moderate to severe centrilobular emphysema. Diffuse bilateral bronchial wall thickening. No pleural effusion or pneumothorax. Musculoskeletal: No chest wall abnormality. No acute or significant osseous findings. Review of the MIP images confirms the above findings. 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 No CT evidence of acute rupture or other acute aortic pathology. Standard branching pattern of the abdominal aorta with solitary bilateral renal arteries. The branch vessel origins are patent and opacified. Review of the MIP images confirms the above findings. NON-VASCULAR Hepatobiliary: No solid liver abnormality is seen. No gallstones, gallbladder wall thickening, or biliary dilatation. Pancreas: Unremarkable. No pancreatic ductal dilatation or surrounding inflammatory changes. Spleen: Normal in size without significant abnormality. Adrenals/Urinary Tract: Adrenal glands are unremarkable. Severely atrophic  left kidney with diminished nephrogram. Multifocal cortical scarring of the right kidney. Multiple small right renal cysts. No hydronephrosis. Bladder is unremarkable. Stomach/Bowel: Stomach is within normal limits. Appendix appears normal. No evidence of bowel wall thickening, distention, or inflammatory changes. Colonic  diverticula. Lymphatic: No enlarged abdominal or pelvic lymph nodes. Reproductive: No mass or other significant abnormality. Other: No abdominal wall hernia or abnormality. No abdominopelvic ascites. Musculoskeletal: No acute or significant osseous findings. Review of the MIP images confirms the above findings. IMPRESSION: 1. 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 No CT evidence of acute rupture or other acute aortic pathology. Recommend referral to a vascular specialist. This recommendation follows ACR consensus guidelines: White Paper of the ACR Incidental Findings Committee II on Vascular Findings. J Am Coll Radiol 2013; 10:789-794. 2. Normal contour and caliber of the thoracic aorta. No evidence of thoracic aortic aneurysm, dissection, or other acute aortic pathology. 3. Enlargement of the main pulmonary artery, as can be seen in pulmonary hypertension. 4. Emphysema and diffuse bilateral bronchial wall thickening. 5. Cardiomegaly and coronary artery disease. Aortic Atherosclerosis (ICD10-I70.0) and Emphysema (ICD10-J43.9). Electronically Signed   By: Delanna Ahmadi M.D.   On: 02/04/2021 14:29    DVT Prophylaxis -SCD/Heparin AM Labs Ordered, also please review Full Orders  Family Communication: Admission, patients condition and plan of care including tests being ordered have been discussed with the patient and sister at bedside who indicate understanding and agree with the plan   Code Status - Full Code  Likely DC to home  Condition  stable  Roxan Hockey M.D on 02/04/2021 at 7:57 PM Go to [email or web address] -  for contact info  [facility] - Office  [phone]

## 2021-02-04 NOTE — ED Provider Notes (Signed)
[facility] EMERGENCY DEPARTMENT Provider Note   CSN: [id number] Arrival date & time: 02/04/21  1210     History Chief Complaint  Patient presents with   Hypertension    Fernando Dean is a 70 y.o. male.  Patient presents the emergency department with bradycardia.  He saw his family doctor last week and had some abdominal discomfort and a heme positive stool and was sent over to the gastroenterologist  The history is provided by the patient and medical records. No language interpreter was used.  Hypertension This is a new problem. The current episode started more than 1 week ago. The problem occurs constantly. The problem has not changed since onset.Pertinent negatives include no chest pain, no abdominal pain and no headaches. Nothing aggravates the symptoms. Nothing relieves the symptoms. The treatment provided no relief.      Past Medical History:  Diagnosis Date   Heme positive stool    Hypertension     Patient Active Problem List   Diagnosis Date Noted   Heart block 02/04/2021   Positive fecal occult blood test 02/01/2021   Bradycardia 02/01/2021   Essential hypertension 02/01/2021   Encounter for abdominal aortic aneurysm (AAA) screening 02/01/2021    Past Surgical History:  Procedure Laterality Date   HERNIA REPAIR     2012       Family History  Problem Relation Age of Onset   Bladder Cancer Mother    Heart disease Father     Social History   Tobacco Use   Smoking status: Every Day    Packs/day: 1.00    Types: Cigarettes   Smokeless tobacco: Never  Vaping Use   Vaping Use: Never used  Substance Use Topics   Alcohol use: Yes    Comment: daily liquor moderatly    Home Medications Prior to Admission medications   Medication Sig Start Date End Date Taking? Authorizing Provider  Multiple Vitamins-Minerals (MULTIVITAMIN WITH MINERALS) tablet Take 1 tablet by mouth daily.   Yes [provider]    Allergies    Advil [ibuprofen],  Aleve [naproxen], and Nsaids  Review of Systems   Review of Systems  Constitutional:  Negative for appetite change and fatigue.  HENT:  Negative for congestion, ear discharge and sinus pressure.   Eyes:  Negative for discharge.  Respiratory:  Negative for cough.   Cardiovascular:  Negative for chest pain.  Gastrointestinal:  Negative for abdominal pain and diarrhea.  Genitourinary:  Negative for frequency and hematuria.  Musculoskeletal:  Negative for back pain.  Skin:  Negative for rash.  Neurological:  Negative for seizures and headaches.  Psychiatric/Behavioral:  Negative for hallucinations.    Physical Exam Updated Vital Signs BP (!) 194/71    Pulse (!) 39    Temp 97.7 F (36.5 C) (Oral)    Resp (!) 22    Ht 5\' 11"  (1.803 m)    Wt 75.8 kg    SpO2 100%    BMI 23.29 kg/m   Physical Exam Vitals and nursing note reviewed.  Constitutional:      Appearance: He is well-developed.  HENT:     Head: Normocephalic.     Nose: Nose normal.  Eyes:     General: No scleral icterus.    Conjunctiva/sclera: Conjunctivae normal.  Neck:     Thyroid: No thyromegaly.  Cardiovascular:     Rate and Rhythm: Normal rate and regular rhythm.     Heart sounds: No murmur heard.   No friction rub. No  gallop.  Pulmonary:     Breath sounds: No stridor. No wheezing or rales.  Chest:     Chest wall: No tenderness.  Abdominal:     General: There is no distension.     Tenderness: There is no abdominal tenderness. There is no rebound.  Musculoskeletal:        General: Normal range of motion.     Cervical back: Neck supple.  Lymphadenopathy:     Cervical: No cervical adenopathy.  Skin:    Findings: No erythema or rash.  Neurological:     Mental Status: He is alert and oriented to person, place, and time.     Motor: No abnormal muscle tone.     Coordination: Coordination normal.  Psychiatric:        Behavior: Behavior normal.    ED Results / Procedures / Treatments   Labs (all labs ordered  are listed, but only abnormal results are displayed) Labs Reviewed  CBC WITH DIFFERENTIAL/PLATELET - Abnormal; Notable for the following components:      Result Value   WBC 11.2 (*)    Hemoglobin 11.7 (*)    HCT 38.4 (*)    RDW 16.3 (*)    Monocytes Absolute 1.5 (*)    All other components within normal limits  COMPREHENSIVE METABOLIC PANEL - Abnormal; Notable for the following components:   CO2 19 (*)    Glucose, Bld 106 (*)    BUN 25 (*)    Creatinine, Ser 1.86 (*)    Total Protein 8.4 (*)    Alkaline Phosphatase 133 (*)    GFR, Estimated 38 (*)    All other components within normal limits  RESP PANEL BY RT-PCR (FLU A&B, COVID) [id number]  TSH    EKG EKG Interpretation  Date/Time:  Thursday February 04 2021 12:27:34 EST Ventricular Rate:  41 PR Interval:    QRS Duration: 145 QT Interval:  491 QTC Calculation: 406 R Axis:   -77 Text Interpretation: Complete AV block with wide QRS complex RBBB and LAFB Confirmed by Milton Ferguson [phone]) on 02/04/2021 12:38:47 PM  Radiology DG Chest Port 1 View  Result Date: 02/04/2021 CLINICAL DATA:  Weak EXAM: PORTABLE CHEST 1 VIEW COMPARISON:  02/14/2004 FINDINGS: The heart size and mediastinal contours are within normal limits. Both lungs are clear. The visualized skeletal structures are unremarkable. IMPRESSION: No acute abnormality of the lungs in AP portable projection. Electronically Signed   By: Delanna Ahmadi M.D.   On: 02/04/2021 13:18   CT Angio Chest/Abd/Pel for Dissection W and/or Wo Contrast  Result Date: 02/04/2021 CLINICAL DATA:  Aortic aneurysm EXAM: CT ANGIOGRAPHY CHEST, ABDOMEN AND PELVIS TECHNIQUE: Non-contrast CT of the chest was initially obtained. Multidetector CT imaging through the chest, abdomen and pelvis was performed using the standard protocol during bolus administration of intravenous contrast. Multiplanar reconstructed images and MIPs were obtained and reviewed to evaluate the vascular anatomy. CONTRAST:  24mL  OMNIPAQUE IOHEXOL 350 MG/ML SOLN COMPARISON:  07/21/2011 FINDINGS: CTA CHEST FINDINGS Cardiovascular: Preferential opacification of the thoracic aorta. Normal contour and caliber of the thoracic aorta without evidence of aneurysm, dissection, or other acute aortic pathology. Mild cardiomegaly. Left and right coronary artery calcifications. No pericardial effusion. Enlargement of the main pulmonary artery measuring up to 4.2 cm. Moderate mixed aortic atherosclerosis. Mediastinum/Nodes: No enlarged mediastinal, hilar, or axillary lymph nodes. Thyroid gland, trachea, and esophagus demonstrate no significant findings. Lungs/Pleura: Moderate to severe centrilobular emphysema. Diffuse bilateral bronchial wall thickening. No pleural effusion or pneumothorax. Musculoskeletal:  No chest wall abnormality. No acute or significant osseous findings. Review of the MIP images confirms the above findings. 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 No CT evidence of acute rupture or other acute aortic pathology. Standard branching pattern of the abdominal aorta with solitary bilateral renal arteries. The branch vessel origins are patent and opacified. Review of the MIP images confirms the above findings. NON-VASCULAR Hepatobiliary: No solid liver abnormality is seen. No gallstones, gallbladder wall thickening, or biliary dilatation. Pancreas: Unremarkable. No pancreatic ductal dilatation or surrounding inflammatory changes. Spleen: Normal in size without significant abnormality. Adrenals/Urinary Tract: Adrenal glands are unremarkable. Severely atrophic left kidney with diminished nephrogram. Multifocal cortical scarring of the right kidney. Multiple small right renal cysts. No hydronephrosis. Bladder is unremarkable. Stomach/Bowel: Stomach is within normal limits. Appendix  appears normal. No evidence of bowel wall thickening, distention, or inflammatory changes. Colonic diverticula. Lymphatic: No enlarged abdominal or pelvic lymph nodes. Reproductive: No mass or other significant abnormality. Other: No abdominal wall hernia or abnormality. No abdominopelvic ascites. Musculoskeletal: No acute or significant osseous findings. Review of the MIP images confirms the above findings. IMPRESSION: 1. 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 No CT evidence of acute rupture or other acute aortic pathology. Recommend referral to a vascular specialist. This recommendation follows ACR consensus guidelines: White Paper of the ACR Incidental Findings Committee II on Vascular Findings. J Am Coll Radiol 2013; 10:789-794. 2. Normal contour and caliber of the thoracic aorta. No evidence of thoracic aortic aneurysm, dissection, or other acute aortic pathology. 3. Enlargement of the main pulmonary artery, as can be seen in pulmonary hypertension. 4. Emphysema and diffuse bilateral bronchial wall thickening. 5. Cardiomegaly and coronary artery disease. Aortic Atherosclerosis (ICD10-I70.0) and Emphysema (ICD10-J43.9). Electronically Signed   By: Delanna Ahmadi M.D.   On: 02/04/2021 14:29    Procedures Procedures   Medications Ordered in ED Medications  iohexol (OMNIPAQUE) 350 MG/ML injection 80 mL (80 mLs Intravenous Contrast Given 02/04/21 1403)  hydrALAZINE (APRESOLINE) injection 5 mg (5 mg Intravenous Given 02/04/21 1456)   CRITICAL CARE Performed by: Milton Ferguson Total critical care time: 40 minutes Critical care time was exclusive of separately billable procedures and treating other patients. Critical care was necessary to treat or prevent imminent or life-threatening deterioration. Critical care was  time spent personally by me on the following activities: development of treatment plan with patient and/or surrogate as well as nursing, discussions with consultants, evaluation of patient's response to treatment, examination of patient, obtaining history from patient or surrogate, ordering and performing treatments and interventions, ordering and review of laboratory studies, ordering and review of radiographic studies, pulse oximetry and re-evaluation of patient's condition.  ED Course  I have reviewed the triage vital signs and the nursing notes.  Pertinent labs & imaging results that were available during my care of the patient were reviewed by me and considered in my medical decision making (see chart for details).  Patient had a CT scan that shows abdominal aneurysm.  I spoke to the vascular surgeon Dr. Carlis Abbott and he stated that unless the patient started to have an abdominal pain that could be coming from his aneurysm that he can be  followed up in the office and scheduled for surgery.  Patient was seen by cardiology and is the recommendation to admit the patient to [facility] for further studies. MDM Rules/Calculators/A&P                         Bradycardia from third-degree heart block    Final Clinical Impression(s) / ED Diagnoses Final diagnoses:  Third degree heart block [facility])    Rx / DC Orders ED Discharge Orders     None        Milton Ferguson, MD 02/04/21 1517

## 2021-02-04 NOTE — ED Triage Notes (Signed)
Pt is here for his blood pressure being elevated and his HR being low.  Was sent here from [facility].  Pt alert oriented, no chest pain.  Skin warm and dry

## 2021-02-04 NOTE — ED Notes (Signed)
Pacer pads placed on pt.

## 2021-02-04 NOTE — Consult Note (Signed)
Cardiology Consultation:   Patient ID: KAIYDEN SIMKIN; [id number]; Jan 16, 1951   Admit date: 02/04/2021 Date of Consult: 02/04/2021  Primary Care Provider: [name], FNP Primary Cardiologist: New to [facility] Primary Electrophysiologist: None   Patient Profile:   PEDRAM GOODCHILD is a 70 y.o. male with a history of hypertension, tobacco abuse, and aortic atherosclerosis with borderline aneurysmal dilatation of the infrarenal abdominal aorta at 3.2 cm as of 2013 who is being seen today for the evaluation of bradycardia at the request of Dr. Roderic Palau.  History of Present Illness:   Mr. Quebedeaux presents to the [facility] ER after recent assessments by his new PCP Dr. Nevada Crane and also GI consultation with Dr. Jenetta Downer.  He states that he has not had any regular medical follow-up for 10 years.  Recently established with Dr. Nevada Crane and was apparently told that he had elevated blood pressure and was bradycardic.  He has not been on any antihypertensive therapy since he was in his 6s, stating blood pressure came down eventually when he lost weight.  He also was found to have heme positive stools on screening examination within the last 6 months and was recently evaluated by Dr. Jenetta Downer on December 12 for discussion of colonoscopy. His heart rate was recorded at 37 bpm with blood pressure of 193/77 at that visit, also prominent aortic pulsation on palpation of the abdomen.  As far as symptoms are concerned, he does not report any chest pain, does state that he has been fatigued and short of breath with activity, no dizziness or syncope however.  This has been going on for several weeks.  He has also had intermittent abdominal discomfort, episode this morning that apparently improved after having a bowel movement.  On evaluation in the ER he is noted to be in complete heart block by ECG with right bundle branch block and left anterior fascicular block consistent with conduction system disease.  Blood  pressure is 190-200 range.  CTA of the chest abdomen and pelvis obtained per ER staff shows infrarenal abdominal aortic aneurysm with significant mural thrombus measuring 5.8 x 5.4 cm, no evidence of rupture or dissection.  Thoracic aorta described as normal in caliber and without dissection.  Pulmonary artery is enlarged suggestive of pulmonary hypertension and he also has emphysematous changes with bronchial thickening, cardiomegaly, and coronary artery calcifications.  Creatinine is 1.86, also mildly anemic at 11.7 with MCV 87, TSH 1.006, and glucose 106.  Past Medical History:  Diagnosis Date   Heme positive stool    Hypertension     Past Surgical History:  Procedure Laterality Date   HERNIA REPAIR     2012     Inpatient Medications: Scheduled Meds:  hydrALAZINE  5 mg Intravenous Once    Allergies:    Allergies  Allergen Reactions   Advil [Ibuprofen] Shortness Of Breath and Palpitations   Aleve [Naproxen] Shortness Of Breath   Nsaids Shortness Of Breath    Social History:   Social History   Tobacco Use   Smoking status: Every Day    Packs/day: 1.00    Types: Cigarettes   Smokeless tobacco: Never  Substance Use Topics   Alcohol use: Yes    Comment: daily liquor moderatly     Family History:   The patient's family history includes Bladder Cancer in his mother; Heart disease in his father.  ROS:  Please see the history of present illness.  All other ROS reviewed and negative.     Physical Exam/Data:  Vitals:   02/04/21 1227 02/04/21 1300 02/04/21 1330 02/04/21 1430  BP: (!) 213/69 (!) 194/70 (!) 192/70 (!) 201/63  Pulse:  (!) 40 (!) 40 (!) 39  Resp:  20 16 12   Temp:      TempSrc:      SpO2:  100% 100% 100%  Weight:      Height:       No intake or output data in the 24 hours ending 02/04/21 1458 Filed Weights   02/04/21 1223  Weight: 75.8 kg   Body mass index is 23.29 kg/m.   Gen: Patient appears comfortable at rest. HEENT: Conjunctiva and  lids normal, oropharynx clear. Neck: Supple, no elevated JVP or carotid bruits, no thyromegaly. Lungs: Decreased breath sounds without wheezing, nonlabored breathing at rest. Cardiac: Regular rate and rhythm, no S3, 1/6 systolic murmur, no pericardial rub. Abdomen: Soft, nontender, bowel sounds present, no guarding or rebound.  No bruit.  Abdominal aorta pulsations are palpable. Extremities: No pitting edema, distal pulses 2+. Skin: Warm and dry. Musculoskeletal: No kyphosis. Neuropsychiatric: Alert and oriented x3, affect grossly appropriate.  EKG:  An ECG dated 02/04/2021 was personally reviewed today and demonstrated:  Sinus rhythm with complete heart block, right bundle branch block, and left anterior fascicular block.  No old tracing for comparison.  Telemetry:  I personally reviewed telemetry which shows complete heart block.  Relevant CV Studies:  No previous cardiac testing for review.  Laboratory Data:  Chemistry Recent Labs  Lab 02/04/21 1228  NA 137  K 4.6  CL 107  CO2 19*  GLUCOSE 106*  BUN 25*  CREATININE 1.86*  CALCIUM 9.2  GFRNONAA 38*  ANIONGAP 11    Recent Labs  Lab 02/04/21 1228  PROT 8.4*  ALBUMIN 3.5  AST 22  ALT 12  ALKPHOS 133*  BILITOT 1.0   Hematology Recent Labs  Lab 02/04/21 1228  WBC 11.2*  RBC 4.41  HGB 11.7*  HCT 38.4*  MCV 87.1  MCH 26.5  MCHC 30.5  RDW 16.3*  PLT 228    Radiology/Studies:  DG Chest Port 1 View  Result Date: 02/04/2021 CLINICAL DATA:  Weak EXAM: PORTABLE CHEST 1 VIEW COMPARISON:  02/14/2004 FINDINGS: The heart size and mediastinal contours are within normal limits. Both lungs are clear. The visualized skeletal structures are unremarkable. IMPRESSION: No acute abnormality of the lungs in AP portable projection. Electronically Signed   By: Delanna Ahmadi M.D.   On: 02/04/2021 13:18   CT Angio Chest/Abd/Pel for Dissection W and/or Wo Contrast  Result Date: 02/04/2021 CLINICAL DATA:  Aortic aneurysm EXAM:  CT ANGIOGRAPHY CHEST, ABDOMEN AND PELVIS TECHNIQUE: Non-contrast CT of the chest was initially obtained. Multidetector CT imaging through the chest, abdomen and pelvis was performed using the standard protocol during bolus administration of intravenous contrast. Multiplanar reconstructed images and MIPs were obtained and reviewed to evaluate the vascular anatomy. CONTRAST:  12mL OMNIPAQUE IOHEXOL 350 MG/ML SOLN COMPARISON:  07/21/2011 FINDINGS: CTA CHEST FINDINGS Cardiovascular: Preferential opacification of the thoracic aorta. Normal contour and caliber of the thoracic aorta without evidence of aneurysm, dissection, or other acute aortic pathology. Mild cardiomegaly. Left and right coronary artery calcifications. No pericardial effusion. Enlargement of the main pulmonary artery measuring up to 4.2 cm. Moderate mixed aortic atherosclerosis. Mediastinum/Nodes: No enlarged mediastinal, hilar, or axillary lymph nodes. Thyroid gland, trachea, and esophagus demonstrate no significant findings. Lungs/Pleura: Moderate to severe centrilobular emphysema. Diffuse bilateral bronchial wall thickening. No pleural effusion or pneumothorax. Musculoskeletal: No chest wall abnormality. No  acute or significant osseous findings. Review of the MIP images confirms the above findings. 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 No CT evidence of acute rupture or other acute aortic pathology. Standard branching pattern of the abdominal aorta with solitary bilateral renal arteries. The branch vessel origins are patent and opacified. Review of the MIP images confirms the above findings. NON-VASCULAR Hepatobiliary: No solid liver abnormality is seen. No gallstones, gallbladder wall thickening, or biliary dilatation. Pancreas: Unremarkable. No pancreatic ductal dilatation or  surrounding inflammatory changes. Spleen: Normal in size without significant abnormality. Adrenals/Urinary Tract: Adrenal glands are unremarkable. Severely atrophic left kidney with diminished nephrogram. Multifocal cortical scarring of the right kidney. Multiple small right renal cysts. No hydronephrosis. Bladder is unremarkable. Stomach/Bowel: Stomach is within normal limits. Appendix appears normal. No evidence of bowel wall thickening, distention, or inflammatory changes. Colonic diverticula. Lymphatic: No enlarged abdominal or pelvic lymph nodes. Reproductive: No mass or other significant abnormality. Other: No abdominal wall hernia or abnormality. No abdominopelvic ascites. Musculoskeletal: No acute or significant osseous findings. Review of the MIP images confirms the above findings. IMPRESSION: 1. 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 No CT evidence of acute rupture or other acute aortic pathology. Recommend referral to a vascular specialist. This recommendation follows ACR consensus guidelines: White Paper of the ACR Incidental Findings Committee II on Vascular Findings. J Am Coll Radiol 2013; 10:789-794. 2. Normal contour and caliber of the thoracic aorta. No evidence of thoracic aortic aneurysm, dissection, or other acute aortic pathology. 3. Enlargement of the main pulmonary artery, as can be seen in pulmonary hypertension. 4. Emphysema and diffuse bilateral bronchial wall thickening. 5. Cardiomegaly and coronary artery disease. Aortic Atherosclerosis (ICD10-I70.0) and Emphysema (ICD10-J43.9). Electronically Signed   By: Delanna Ahmadi M.D.   On: 02/04/2021 14:29    Assessment and Plan:   1.  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  He has had no chest pain or frank syncope.  He has a longstanding history of hypertension, presently not on any medications.  Hemodynamically stable at this time, in fact significantly hypertensive.  He is mentating normally and in no acute distress.  TSH normal.  2.  Mild anemia with reportedly heme positive stools based on screening examination, was seen by Dr. Jenetta Downer for discussion of colonoscopy, this is presently on hold.  He does not report any frank melena or hematochezia.  Hemoglobin is 11.7.  3.  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  Vascular surgery contacted by ER staff regarding appropriate follow-up.  No evidence of acute dissection.  4.  Apparent essential hypertension, untreated.  5.  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  6.  Longstanding tobacco abuse.  Emphysematous changes with bronchial wall thickening by CT imaging.  Also enlarged main pulmonary artery raising possibility of pulmonary hypertension.  Patient is being admitted to the hospitalist service, recommend admission at [facility] ultimately when bed  available so that EP consultation can be obtained.  There is no acute need for an emergent pacemaker at this time.  We will obtain an echocardiogram to assess cardiac structure and function and also pulmonary pressures.  Needs vascular surgery consultation and follow-up to address AAA.  Being given hydralazine in the ER for reduction in blood pressure, would start therapy possibly with Norvasc, otherwise no AV nodal blockers and would be careful with ARB/ACE inhibitor in light of his renal insufficiency.  Signed, Rozann Lesches, MD  02/04/2021 2:58 PM

## 2021-02-04 NOTE — ED Notes (Signed)
Called [facility] to transport pt. 

## 2021-02-05 ENCOUNTER — Inpatient Hospital Stay (HOSPITAL_COMMUNITY): Payer: Medicare Other

## 2021-02-05 DIAGNOSIS — R0609 Other forms of dyspnea: Secondary | ICD-10-CM

## 2021-02-05 LAB — CBC
HCT: 34.2 % — ABNORMAL LOW (ref 39.0–52.0)
Hemoglobin: 10.5 g/dL — ABNORMAL LOW (ref 13.0–17.0)
MCH: 25.5 pg — ABNORMAL LOW (ref 26.0–34.0)
MCHC: 30.7 g/dL (ref 30.0–36.0)
MCV: 83 fL (ref 80.0–100.0)
Platelets: 179 10*3/uL (ref 150–400)
RBC: 4.12 MIL/uL — ABNORMAL LOW (ref 4.22–5.81)
RDW: 15.9 % — ABNORMAL HIGH (ref 11.5–15.5)
WBC: 8.2 10*3/uL (ref 4.0–10.5)
nRBC: 0 % (ref 0.0–0.2)

## 2021-02-05 LAB — ECHOCARDIOGRAM COMPLETE
AR max vel: 2.99 cm2
AV Area VTI: 3.2 cm2
AV Area mean vel: 2.91 cm2
AV Mean grad: 7 mmHg
AV Peak grad: 16.8 mmHg
AV Vena cont: 0.4 cm
Ao pk vel: 2.05 m/s
Calc EF: 57 %
Height: 71 in
P 1/2 time: 893 msec
S' Lateral: 3.8 cm
Single Plane A2C EF: 56 %
Single Plane A4C EF: 58.2 %
Weight: 2620.8 oz

## 2021-02-05 LAB — RENAL FUNCTION PANEL
Albumin: 2.7 g/dL — ABNORMAL LOW (ref 3.5–5.0)
Anion gap: 8 (ref 5–15)
BUN: 20 mg/dL (ref 8–23)
CO2: 20 mmol/L — ABNORMAL LOW (ref 22–32)
Calcium: 9 mg/dL (ref 8.9–10.3)
Chloride: 108 mmol/L (ref 98–111)
Creatinine, Ser: 1.82 mg/dL — ABNORMAL HIGH (ref 0.61–1.24)
GFR, Estimated: 39 mL/min — ABNORMAL LOW (ref >60)
Glucose, Bld: 85 mg/dL (ref 70–99)
Phosphorus: 3.1 mg/dL (ref 2.5–4.6)
Potassium: 4.1 mmol/L (ref 3.5–5.1)
Sodium: 136 mmol/L (ref 135–145)

## 2021-02-05 NOTE — Consult Note (Signed)
ELECTROPHYSIOLOGY CONSULT NOTE    Patient ID: MACHAI Dean MRN: [id number], DOB/AGE: 1950-04-20 70 y.o.  Admit date: 02/04/2021 Date of Consult: 02/05/2021  Primary Physician: [name], FNP Primary Cardiologist: None  Electrophysiologist:  New  Referring Provider: Dr. Benny Lennert  Patient Profile: Fernando Dean is a 70 y.o. male with a history of HTN, tobacco abuse, Alcohol abuse, aortic atherosclerosis with "fusiform infrarenal abdominal aortic aneurysm with significant mural thrombus"  who is being seen today for the evaluation of CHB at the request of Dr. Benny Lennert.  HPI:  [name] is a 70 y.o. male with medical history as above.   Mr. Follett presented to [facility] after recent assessments by his new PCP Dr. Nevada Crane and GI consultation with Dr. Jenetta Downer. Prior to these visits, hadn't followed up regularly in about 10 years. Recently established with Dr. Nevada Crane and was apparently told diagnosed with HTN and bradycardia. He denies having taken any BP meds since in his 63s, he lost weight around that time and his BP improved. He also was found to have heme positive stools on screening examination within the last 6 months and was recently evaluated by Dr. Jenetta Downer on December 12 for discussion of colonoscopy. His heart rate was recorded at 37 bpm with blood pressure of 193/77 at that visit, also prominent aortic pulsation on palpation of the abdomen.   On evaluation in the ER he is noted to be in complete heart block by ECG with right bundle branch block and left anterior fascicular block consistent with conduction system disease.  Blood pressure is 190-200 range.   CTA of the chest abdomen and pelvis showed infrarenal abdominal aortic aneurysm with significant mural thrombus measuring 5.8 x 5.4 cm, no evidence of rupture or dissection.  Thoracic aorta described as normal in caliber and without dissection.  Pulmonary artery is enlarged suggestive of pulmonary hypertension and he also  has emphysematous changes with bronchial thickening, cardiomegaly, and coronary artery calcifications.   Pertinent labs on admission include Cr 1.86, Hgb 11.7 with MCV 87, TSH 1.006, and glucose 106.  Pt denies CP or syncope. No lightheadedness or dizziness. He does report fatigue and SOB with moderate activity for "at least several weeks". He has also had intermittent abdominal discomfort, At times improved after having a bowel movement.  Past Medical History:  Diagnosis Date   Heme positive stool    Hypertension      Surgical History:  Past Surgical History:  Procedure Laterality Date   HERNIA REPAIR     2012     Medications Prior to Admission  Medication Sig Dispense Refill Last Dose   Multiple Vitamins-Minerals (MULTIVITAMIN WITH MINERALS) tablet Take 1 tablet by mouth daily.   02/04/2021    Inpatient Medications:   amLODipine  10 mg Oral Daily   diazepam  2.5 mg Oral TID   folic acid  1 mg Oral Daily   heparin  5,000 Units Subcutaneous Q8H   multivitamin with minerals  1 tablet Oral Daily   nicotine  21 mg Transdermal Daily   sodium chloride flush  3 mL Intravenous Q12H   sodium chloride flush  3 mL Intravenous Q12H   thiamine  100 mg Oral Daily   Or   thiamine  100 mg Intravenous Daily   umeclidinium-vilanterol  1 puff Inhalation Daily    Allergies:  Allergies  Allergen Reactions   Advil [Ibuprofen] Shortness Of Breath and Palpitations   Aleve [Naproxen] Shortness Of Breath   Nsaids Shortness Of  Breath    Social History   Socioeconomic History   Marital status: Single    Spouse name: Not on file   Number of children: Not on file   Years of education: Not on file   Highest education level: Not on file  Occupational History   Not on file  Tobacco Use   Smoking status: Every Day    Packs/day: 1.00    Types: Cigarettes   Smokeless tobacco: Never  Vaping Use   Vaping Use: Never used  Substance and Sexual Activity   Alcohol use: Yes    Comment: daily  liquor moderatly   Drug use: Not on file   Sexual activity: Not on file  Other Topics Concern   Not on file  Social History Narrative   Not on file   Social Determinants of Health   Financial Resource Strain: Not on file  Food Insecurity: Not on file  Transportation Needs: Not on file  Physical Activity: Not on file  Stress: Not on file  Social Connections: Not on file  Intimate Partner Violence: Not on file     Family History  Problem Relation Age of Onset   Bladder Cancer Mother    Heart disease Father      Review of Systems: All other systems reviewed and are otherwise negative except as noted above.  Physical Exam: Vitals:   02/05/21 0100 02/05/21 0434 02/05/21 0848 02/05/21 0938  BP: (!) 160/91 (!) 162/51  (!) 161/57  Pulse:  (!) 38    Resp:      Temp: 97.9 F (36.6 C) 98.5 F (36.9 C)    TempSrc: Oral Oral    SpO2:   98%   Weight:      Height:        GEN- The patient is well appearing, alert and oriented x 3 today.   HEENT: normocephalic, atraumatic; sclera clear, conjunctiva pink; hearing intact; oropharynx clear; neck supple Lungs- Clear to ausculation bilaterally, normal work of breathing.  No wheezes, rales, rhonchi Heart- Regular rate and rhythm, no murmurs, rubs or gallops GI- soft, non-tender, non-distended, bowel sounds present Extremities- no clubbing, cyanosis, or edema; DP/PT/radial pulses 2+ bilaterally MS- no significant deformity or atrophy Skin- warm and dry, no rash or lesion Psych- euthymic mood, full affect Neuro- strength and sensation are intact  Labs:   Lab Results  Component Value Date   WBC 8.2 02/05/2021   HGB 10.5 (L) 02/05/2021   HCT 34.2 (L) 02/05/2021   MCV 83.0 02/05/2021   PLT 179 02/05/2021    Recent Labs  Lab 02/04/21 1228 02/05/21 0315  NA 137 136  K 4.6 4.1  CL 107 108  CO2 19* 20*  BUN 25* 20  CREATININE 1.86* 1.82*  CALCIUM 9.2 9.0  PROT 8.4*  --   BILITOT 1.0  --   ALKPHOS 133*  --   ALT 12  --    AST 22  --   GLUCOSE 106* 85      Radiology/Studies: DG Chest Port 1 View  Result Date: 02/04/2021 CLINICAL DATA:  Weak EXAM: PORTABLE CHEST 1 VIEW COMPARISON:  02/14/2004 FINDINGS: The heart size and mediastinal contours are within normal limits. Both lungs are clear. The visualized skeletal structures are unremarkable. IMPRESSION: No acute abnormality of the lungs in AP portable projection. Electronically Signed   By: Delanna Ahmadi M.D.   On: 02/04/2021 13:18   CT Angio Chest/Abd/Pel for Dissection W and/or Wo Contrast  Result Date: 02/04/2021 CLINICAL DATA:  Aortic aneurysm EXAM: CT ANGIOGRAPHY CHEST, ABDOMEN AND PELVIS TECHNIQUE: Non-contrast CT of the chest was initially obtained. Multidetector CT imaging through the chest, abdomen and pelvis was performed using the standard protocol during bolus administration of intravenous contrast. Multiplanar reconstructed images and MIPs were obtained and reviewed to evaluate the vascular anatomy. CONTRAST:  43mL OMNIPAQUE IOHEXOL 350 MG/ML SOLN COMPARISON:  07/21/2011 FINDINGS: CTA CHEST FINDINGS Cardiovascular: Preferential opacification of the thoracic aorta. Normal contour and caliber of the thoracic aorta without evidence of aneurysm, dissection, or other acute aortic pathology. Mild cardiomegaly. Left and right coronary artery calcifications. No pericardial effusion. Enlargement of the main pulmonary artery measuring up to 4.2 cm. Moderate mixed aortic atherosclerosis. Mediastinum/Nodes: No enlarged mediastinal, hilar, or axillary lymph nodes. Thyroid gland, trachea, and esophagus demonstrate no significant findings. Lungs/Pleura: Moderate to severe centrilobular emphysema. Diffuse bilateral bronchial wall thickening. No pleural effusion or pneumothorax. Musculoskeletal: No chest wall abnormality. No acute or significant osseous findings. Review of the MIP images confirms the above findings. CTA ABDOMEN AND PELVIS FINDINGS VASCULAR There is a fusiform  aneurysm of the infrarenal abdominal aorta with a large burden of mural thrombus, largest component measuring 5.8 x 5.4 cm (series 5, image 135). No CT evidence of acute rupture or other acute aortic pathology. Standard branching pattern of the abdominal aorta with solitary bilateral renal arteries. The branch vessel origins are patent and opacified. Review of the MIP images confirms the above findings. NON-VASCULAR Hepatobiliary: No solid liver abnormality is seen. No gallstones, gallbladder wall thickening, or biliary dilatation. Pancreas: Unremarkable. No pancreatic ductal dilatation or surrounding inflammatory changes. Spleen: Normal in size without significant abnormality. Adrenals/Urinary Tract: Adrenal glands are unremarkable. Severely atrophic left kidney with diminished nephrogram. Multifocal cortical scarring of the right kidney. Multiple small right renal cysts. No hydronephrosis. Bladder is unremarkable. Stomach/Bowel: Stomach is within normal limits. Appendix appears normal. No evidence of bowel wall thickening, distention, or inflammatory changes. Colonic diverticula. Lymphatic: No enlarged abdominal or pelvic lymph nodes. Reproductive: No mass or other significant abnormality. Other: No abdominal wall hernia or abnormality. No abdominopelvic ascites. Musculoskeletal: No acute or significant osseous findings. Review of the MIP images confirms the above findings. IMPRESSION: 1. 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 No CT evidence of acute rupture or other acute aortic pathology. Recommend referral to a vascular specialist. This recommendation follows ACR consensus guidelines: White Paper of the ACR Incidental Findings Committee II on Vascular Findings. J Am Coll Radiol 2013; 10:789-794. 2. Normal contour and  caliber of the thoracic aorta. No evidence of thoracic aortic aneurysm, dissection, or other acute aortic pathology. 3. Enlargement of the main pulmonary artery, as can be seen in pulmonary hypertension. 4. Emphysema and diffuse bilateral bronchial wall thickening. 5. Cardiomegaly and coronary artery disease. Aortic Atherosclerosis (ICD10-I70.0) and Emphysema (ICD10-J43.9). Electronically Signed   By: Delanna Ahmadi M.D.   On: 02/04/2021 14:29    EKG: on arrival appears to show CHB with escape of 39 bpm  (personally reviewed)  TELEMETRY: CHB 30-40s (personally reviewed)   Assessment/Plan: 1.  Complete heart block Underlying conduction disease with RBBB and LAFB By symptoms possibly for several weeks   No chest pain or frank syncope. Longstanding history of un-managed HTN.  NAD Echo is pending (being performed on our exam)   2.  Fusiform infrarenal abdominal aortic aneurysm With significant  mural thrombus, 5.8 x 5.4 cm and substantially enlarged in comparison to previous CT imaging from 2013.  Vascular surgery contacted by ER staff regarding appropriate follow-up.  No evidence of acute dissection.   3.  Apparent essential hypertension, untreated Likely exacerbated in setting of AAA and CHB   5.  Renal insufficiency Cr 1.86 on admission. Unclear baseline.    6.  Longstanding tobacco abuse Emphysematous changes with bronchial wall thickening by CT imaging.   Pt has multiple significant co-morbidities with a fairly high risk of mortality. His work up is on-going and we will follow along. I suspect there are too many unanswered questions at this juncture to proceed immediately with pacing. More likely next week, pending course.   For questions or updates, please contact [place] Please consult [email or web address] for contact info under Cardiology/STEMI.  Jacalyn Lefevre, PA-C  02/05/2021 9:59 AM

## 2021-02-05 NOTE — Progress Notes (Signed)
CSW received consult for substance resources for patient. CSW spoke with patient at bedside. CSW offered patient outpatient substance use treatment services resources. Patient accepted.All questions answered. No further questions reported at this time.

## 2021-02-05 NOTE — Progress Notes (Signed)
°  Echocardiogram 2D Echocardiogram has been performed.  Fidel Levy 02/05/2021, 10:51 AM

## 2021-02-05 NOTE — Progress Notes (Signed)
PROGRESS NOTE  Fernando Dean NOM:[id number] DOB: 10-24-1950 DOA: 02/04/2021 PCP: [name], FNP  Brief History   Fernando Dean  is a 70 y.o. 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  Thoracic aorta described as normal in caliber and without dissection.  Pulmonary artery is enlarged suggestive of pulmonary hypertension and he also has emphysematous changes with bronchial thickening, cardiomegaly, and coronary artery calcifications. -Creatinine is 1.86, no recent baseline -WBCs 11.2 , hemoglobin 11.7, platelets 228 -TSH 1.0, potassium 4.6, magnesium pending  The patient was transferred from [facility] ED to [name] for evaluation of his complete heart block by EP Cardiology.  Consultants  EP Cardiology  Procedures  None  Antibiotics   Anti-infectives (From admission, onward)    None      Subjective  The patient is resting comfortably. No new complaints.  Objective   Vitals:  Vitals:   02/05/21 1201 02/05/21 1602  BP: (!) 153/55 (!) 167/74  Pulse: (!) 41 (!) 38  Resp: 18 20  Temp: 98.6 F (37 C) 98.6 F (37 C)  SpO2:      Exam:  Constitutional:  The patient is awake, alert, and  oriented x 3. No acute distress. Respiratory:  No increased work of breathing. No wheezes, rales, or rhonchi No tactile fremitus Cardiovascular:  Regular rate and rhythm - Slow No murmurs, ectopy, or gallups. No lateral PMI. No thrills. Abdomen:  Abdomen is soft, non-tender, non-distended No hernias, masses, or organomegaly Normoactive bowel sounds.  Musculoskeletal:  No cyanosis, clubbing, or edema Cachectic Skin:  No rashes, lesions, ulcers palpation of skin: no induration or nodules Neurologic:  CN 2-12 intact Sensation all 4 extremities intact Psychiatric:  Mental status Mood, affect appropriate Orientation to person, place, time  judgment and insight appear intact I have personally reviewed the following:   Today's Data  Vitals  Lab Data  CBC BMP  Micro Data    Imaging  CTA chest/abdomen/pelvis   Cardiology Data  EKG Echocardiogram  Scheduled Meds:  amLODipine  10 mg Oral Daily   diazepam  2.5 mg Oral TID   folic acid  1 mg Oral Daily   heparin  5,000 Units Subcutaneous Q8H   multivitamin with minerals  1 tablet Oral Daily   nicotine  21 mg Transdermal Daily   sodium chloride flush  3 mL Intravenous Q12H   sodium chloride flush  3 mL Intravenous Q12H   thiamine  100 mg Oral Daily   Or   thiamine  100 mg Intravenous Daily   umeclidinium-vilanterol  1 puff Inhalation Daily   Continuous Infusions:  sodium chloride      Principal Problem:   Bradycardia/Complete Heart Block Active Problems:  Essential hypertension   Heart block   Alcohol abuse   Tobacco abuse   CKD (chronic kidney disease), stage IIIB (HCC)   LOS: 1 day  1)Bradycardia/Complete Heart Block -General cardiology consult appreciated HR  high 30s and low 40s --EKG shows complete heart block as well as RBBB and block and LAFB TSH 1.0, potassium 4.6, magnesium 1.8 -Troponin pending -patient needs EP consultation at [facility] -Dr. Chalmers Cater has evaluated the patient for EP  Cardiology. I appreciate his assistance. -Avoid negative chronotropic agents -Pacer pads in place -Patient denies syncope, admits to some dizziness, no chest pains ,has some dyspnea on exertion but no dyspnea at rest -Echo demonstrates EF 60-65%. Mild LVH. RV systolic function is normal. There is mildly elevated pulmonary artery systolic pressure. Left atrium is severely dilated. Right atrium is moderately dilated. There is moderate aortic valve regurgitation.   2) alcohol abuse----patient drinks at least 1 bottle of vodka each day along with beer -High risk for DTs -Scheduled Valium as prescribed -Lorazepam per CIWA protocol along with multivitamin thiamine and folic acid   3) tobacco abuse--smoking cessation advised nicotine patch as prescribed   4) CKD stage -3B -No recent creatinine or renal labs available -Suspect patient probably have stage IIIb CKD at baseline given underlying longstanding hypertension - renally adjust medications, avoid nephrotoxic agents / dehydration  / hypotension -Severely atrophic left kidney. -Cortical scarring of the right kidney.   5)HTN--stage II, okay to start amlodipine 10 mg daily  may use IV Hydralazine 10 mg  Every 4 hours Prn for systolic blood pressure over 160 mmhg -Obviously avoid negative chronotropic agents avoid ACEI/ARB/ARNI due to renal concerns   6)Heme +ve Stool--with mild anemia-  hemoglobin 11.7, currently evaluated by Dr. 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-- -- Bronchodilators as advised hold off on steroid   8)AAA-CT abdomen and pelvis showed  infrarenal abdominal aortic aneurysm with significant mural thrombus measuring 5.8 x 5.4 cm, no evidence of rupture or dissection-- --EDP spoke with on-call vascular surgeon who advised outpatient vascular surgery follow-up    Disposition/Need for in-Hospital Stay-  patient unable to be discharged at this time due to persistent severe bradycardia requiring further EP evaluation,    Status is: Inpatient   Remains inpatient appropriate because: See disposition above   Dispo: The patient is from: Home              Anticipated d/c is to: Home              Anticipated d/c date is: 2 days              Patient currently is not medically stable to d/c.

## 2021-02-06 DIAGNOSIS — R195 Other fecal abnormalities: Secondary | ICD-10-CM

## 2021-02-06 LAB — CBC WITH DIFFERENTIAL/PLATELET
Abs Immature Granulocytes: 0.02 10*3/uL (ref 0.00–0.07)
Basophils Absolute: 0.1 10*3/uL (ref 0.0–0.1)
Basophils Relative: 1 %
Eosinophils Absolute: 0.5 10*3/uL (ref 0.0–0.5)
Eosinophils Relative: 8 %
HCT: 36.8 % — ABNORMAL LOW (ref 39.0–52.0)
Hemoglobin: 11.2 g/dL — ABNORMAL LOW (ref 13.0–17.0)
Immature Granulocytes: 0 %
Lymphocytes Relative: 37 %
Lymphs Abs: 2.5 10*3/uL (ref 0.7–4.0)
MCH: 25.2 pg — ABNORMAL LOW (ref 26.0–34.0)
MCHC: 30.4 g/dL (ref 30.0–36.0)
MCV: 82.9 fL (ref 80.0–100.0)
Monocytes Absolute: 0.8 10*3/uL (ref 0.1–1.0)
Monocytes Relative: 13 %
Neutro Abs: 2.8 10*3/uL (ref 1.7–7.7)
Neutrophils Relative %: 41 %
Platelets: 193 10*3/uL (ref 150–400)
RBC: 4.44 MIL/uL (ref 4.22–5.81)
RDW: 16 % — ABNORMAL HIGH (ref 11.5–15.5)
WBC: 6.7 10*3/uL (ref 4.0–10.5)
nRBC: 0 % (ref 0.0–0.2)

## 2021-02-06 LAB — COMPREHENSIVE METABOLIC PANEL
ALT: 12 U/L (ref 0–44)
AST: 20 U/L (ref 15–41)
Albumin: 2.7 g/dL — ABNORMAL LOW (ref 3.5–5.0)
Alkaline Phosphatase: 117 U/L (ref 38–126)
Anion gap: 9 (ref 5–15)
BUN: 19 mg/dL (ref 8–23)
CO2: 21 mmol/L — ABNORMAL LOW (ref 22–32)
Calcium: 9.1 mg/dL (ref 8.9–10.3)
Chloride: 106 mmol/L (ref 98–111)
Creatinine, Ser: 1.82 mg/dL — ABNORMAL HIGH (ref 0.61–1.24)
GFR, Estimated: 39 mL/min — ABNORMAL LOW (ref >60)
Glucose, Bld: 96 mg/dL (ref 70–99)
Potassium: 3.9 mmol/L (ref 3.5–5.1)
Sodium: 136 mmol/L (ref 135–145)
Total Bilirubin: 0.6 mg/dL (ref 0.3–1.2)
Total Protein: 6.8 g/dL (ref 6.5–8.1)

## 2021-02-06 NOTE — Progress Notes (Signed)
Progress Note  Patient Name: Fernando Dean Date of Encounter: 02/06/2021  Primary Cardiologist: None   Subjective   No chest pain or sob.   Inpatient Medications    Scheduled Meds:  amLODipine  10 mg Oral Daily   diazepam  2.5 mg Oral TID   folic acid  1 mg Oral Daily   heparin  5,000 Units Subcutaneous Q8H   multivitamin with minerals  1 tablet Oral Daily   nicotine  21 mg Transdermal Daily   sodium chloride flush  3 mL Intravenous Q12H   sodium chloride flush  3 mL Intravenous Q12H   thiamine  100 mg Oral Daily   Or   thiamine  100 mg Intravenous Daily   umeclidinium-vilanterol  1 puff Inhalation Daily   Continuous Infusions:  sodium chloride     PRN Meds: sodium chloride, acetaminophen **OR** acetaminophen, albuterol, bisacodyl, hydrALAZINE, LORazepam **OR** LORazepam, ondansetron **OR** ondansetron (ZOFRAN) IV, polyethylene glycol, sodium chloride flush, traZODone   Vital Signs    Vitals:   02/05/21 1602 02/05/21 1950 02/06/21 0559 02/06/21 0733  BP: (!) 167/74 (!) 168/73 (!) 151/51   Pulse: (!) 38 (!) 42 (!) 35   Resp: 20 18 16    Temp: 98.6 F (37 C) 98.4 F (36.9 C) 98.3 F (36.8 C)   TempSrc: Oral Oral Oral   SpO2:  99% 100% 100%  Weight:      Height:        Intake/Output Summary (Last 24 hours) at 02/06/2021 0944 Last data filed at 02/06/2021 0604 Gross per 24 hour  Intake 240 ml  Output 300 ml  Net -60 ml   Filed Weights   02/04/21 1223 02/04/21 2111  Weight: 75.8 kg 74.3 kg    Telemetry    Nsr with CHB - Personally Reviewed  ECG    NSR with CHB - Personally Reviewed  Physical Exam   GEN: No acute distress.   Neck: No JVD Cardiac: RRR, no murmurs, rubs, or gallops.  Respiratory: Clear to auscultation bilaterally. GI: Soft, nontender, non-distended  MS: No edema; No deformity. Neuro:  Nonfocal  Psych: Normal affect   Labs    Chemistry Recent Labs  Lab 02/04/21 1228 02/05/21 0315 02/06/21 0159  NA 137 136 136  K  4.6 4.1 3.9  CL 107 108 106  CO2 19* 20* 21*  GLUCOSE 106* 85 96  BUN 25* 20 19  CREATININE 1.86* 1.82* 1.82*  CALCIUM 9.2 9.0 9.1  PROT 8.4*  --  6.8  ALBUMIN 3.5 2.7* 2.7*  AST 22  --  20  ALT 12  --  12  ALKPHOS 133*  --  117  BILITOT 1.0  --  0.6  GFRNONAA 38* 39* 39*  ANIONGAP 11 8 9      Hematology Recent Labs  Lab 02/04/21 1228 02/05/21 0315 02/06/21 0159  WBC 11.2* 8.2 6.7  RBC 4.41 4.12* 4.44  HGB 11.7* 10.5* 11.2*  HCT 38.4* 34.2* 36.8*  MCV 87.1 83.0 82.9  MCH 26.5 25.5* 25.2*  MCHC 30.5 30.7 30.4  RDW 16.3* 15.9* 16.0*  PLT 228 179 193    Cardiac EnzymesNo results for input(s): TROPONINI in the last 168 hours. No results for input(s): TROPIPOC in the last 168 hours.   BNPNo results for input(s): BNP, PROBNP in the last 168 hours.   DDimer No results for input(s): DDIMER in the last 168 hours.   Radiology    DG Chest Port 1 View  Result Date: 02/04/2021 CLINICAL  DATA:  Weak EXAM: PORTABLE CHEST 1 VIEW COMPARISON:  02/14/2004 FINDINGS: The heart size and mediastinal contours are within normal limits. Both lungs are clear. The visualized skeletal structures are unremarkable. IMPRESSION: No acute abnormality of the lungs in AP portable projection. Electronically Signed   By: Delanna Ahmadi M.D.   On: 02/04/2021 13:18   ECHOCARDIOGRAM COMPLETE  Result Date: 02/05/2021    ECHOCARDIOGRAM REPORT   Patient Name:   Fernando Dean Date of Exam: 02/05/2021 Medical Rec #:  [id number]        Height:       71.0 in Accession #:    [id number]       Weight:       163.8 lb Date of Birth:  May 18, 1950        BSA:          1.937 m Patient Age:    33 years         BP:           162/57 mmHg Patient Gender: M                HR:           37 bpm. Exam Location:  Inpatient Procedure: 2D Echo, 3D Echo, Cardiac Doppler and Color Doppler Indications:    Dyspnea  History:        Patient has no prior history of Echocardiogram examinations.                 Risk Factors:Hypertension.   Sonographer:    Bernadene Person RDCS Referring Phys: Axis  1. Left ventricular ejection fraction, by estimation, is 60 to 65%. The left ventricle has normal function. The left ventricle has no regional wall motion abnormalities. There is mild left ventricular hypertrophy. Left ventricular diastolic parameters are indeterminate.  2. Right ventricular systolic function is normal. The right ventricular size is not well visualized. There is mildly elevated pulmonary artery systolic pressure.  3. Left atrial size was severely dilated.  4. Right atrial size was moderately dilated.  5. The mitral valve is grossly normal. Trivial mitral valve regurgitation.  6. AI jet against the anterior mitral valve leaflet. Jet eccentric P1/2T may underestimate. The aortic valve is calcified. Aortic valve regurgitation is moderate.  7. The inferior vena cava is normal in size with greater than 50% respiratory variability, suggesting right atrial pressure of 3 mmHg. FINDINGS  Left Ventricle: Left ventricular ejection fraction, by estimation, is 60 to 65%. The left ventricle has normal function. The left ventricle has no regional wall motion abnormalities. The left ventricular internal cavity size was normal in size. There is  mild left ventricular hypertrophy. Left ventricular diastolic parameters are indeterminate. Right Ventricle: The right ventricular size is not well visualized. No increase in right ventricular wall thickness. Right ventricular systolic function is normal. There is mildly elevated pulmonary artery systolic pressure. The tricuspid regurgitant velocity is 2.93 m/s, and with an assumed right atrial pressure of 3 mmHg, the estimated right ventricular systolic pressure is 62.6 mmHg. Left Atrium: Left atrial size was severely dilated. Right Atrium: Right atrial size was moderately dilated. Pericardium: There is no evidence of pericardial effusion. Mitral Valve: The mitral valve is grossly  normal. Trivial mitral valve regurgitation. Tricuspid Valve: The tricuspid valve is grossly normal. Tricuspid valve regurgitation is mild. Aortic Valve: AI jet against the anterior mitral valve leaflet. Jet eccentric P1/2T may underestimate. The aortic valve is calcified. Aortic valve regurgitation is moderate.  Aortic regurgitation PHT measures 893 msec. Aortic valve mean gradient measures 7.0 mmHg. Aortic valve peak gradient measures 16.8 mmHg. Aortic valve area, by VTI measures 3.20 cm. Pulmonic Valve: The pulmonic valve was not well visualized. Pulmonic valve regurgitation is trivial. Aorta: The aortic root and ascending aorta are structurally normal, with no evidence of dilitation. Venous: The inferior vena cava is normal in size with greater than 50% respiratory variability, suggesting right atrial pressure of 3 mmHg. IAS/Shunts: No atrial level shunt detected by color flow Doppler.  LEFT VENTRICLE PLAX 2D LVIDd:         5.80 cm LVIDs:         3.80 cm LV PW:         1.30 cm LV IVS:        1.00 cm LVOT diam:     2.10 cm      3D Volume EF: LV SV:         130          3D EF:        58 % LV SV Index:   67           LV EDV:       212 ml LVOT Area:     3.46 cm     LV ESV:       89 ml                             LV SV:        123 ml  LV Volumes (MOD) LV vol d, MOD A2C: 174.0 ml LV vol d, MOD A4C: 205.0 ml LV vol s, MOD A2C: 76.5 ml LV vol s, MOD A4C: 85.7 ml LV SV MOD A2C:     97.5 ml LV SV MOD A4C:     205.0 ml LV SV MOD BP:      107.7 ml RIGHT VENTRICLE TAPSE (M-mode): 1.8 cm LEFT ATRIUM              Index        RIGHT ATRIUM           Index LA diam:        4.30 cm  2.22 cm/m   RA Area:     19.00 cm LA Vol (A2C):   74.4 ml  38.41 ml/m  RA Volume:   58.00 ml  29.94 ml/m LA Vol (A4C):   118.0 ml 60.92 ml/m LA Biplane Vol: 97.7 ml  50.44 ml/m  AORTIC VALVE AV Area (Vmax):    2.99 cm AV Area (Vmean):   2.91 cm AV Area (VTI):     3.20 cm AV Vmax:           205.00 cm/s AV Vmean:          115.000 cm/s AV VTI:             0.405 m AV Peak Grad:      16.8 mmHg AV Mean Grad:      7.0 mmHg LVOT Vmax:         177.00 cm/s LVOT Vmean:        96.600 cm/s LVOT VTI:          0.374 m LVOT/AV VTI ratio: 0.92 AI PHT:            893 msec AR Vena Contracta: 0.40 cm  AORTA Ao Root diam: 3.50 cm Ao Asc diam:  3.80 cm TRICUSPID VALVE TR Peak grad:   34.3 mmHg TR Vmax:        293.00 cm/s  SHUNTS Systemic VTI:  0.37 m Systemic Diam: 2.10 cm Phineas Inches Electronically signed by Phineas Inches Signature Date/Time: 02/05/2021/12:35:37 PM    Final    CT Angio Chest/Abd/Pel for Dissection W and/or Wo Contrast  Result Date: 02/04/2021 CLINICAL DATA:  Aortic aneurysm EXAM: CT ANGIOGRAPHY CHEST, ABDOMEN AND PELVIS TECHNIQUE: Non-contrast CT of the chest was initially obtained. Multidetector CT imaging through the chest, abdomen and pelvis was performed using the standard protocol during bolus administration of intravenous contrast. Multiplanar reconstructed images and MIPs were obtained and reviewed to evaluate the vascular anatomy. CONTRAST:  35mL OMNIPAQUE IOHEXOL 350 MG/ML SOLN COMPARISON:  07/21/2011 FINDINGS: CTA CHEST FINDINGS Cardiovascular: Preferential opacification of the thoracic aorta. Normal contour and caliber of the thoracic aorta without evidence of aneurysm, dissection, or other acute aortic pathology. Mild cardiomegaly. Left and right coronary artery calcifications. No pericardial effusion. Enlargement of the main pulmonary artery measuring up to 4.2 cm. Moderate mixed aortic atherosclerosis. Mediastinum/Nodes: No enlarged mediastinal, hilar, or axillary lymph nodes. Thyroid gland, trachea, and esophagus demonstrate no significant findings. Lungs/Pleura: Moderate to severe centrilobular emphysema. Diffuse bilateral bronchial wall thickening. No pleural effusion or pneumothorax. Musculoskeletal: No chest wall abnormality. No acute or significant osseous findings. Review of the MIP images confirms the above findings. 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 No CT evidence of acute rupture or other acute aortic pathology. Standard branching pattern of the abdominal aorta with solitary bilateral renal arteries. The branch vessel origins are patent and opacified. Review of the MIP images confirms the above findings. NON-VASCULAR Hepatobiliary: No solid liver abnormality is seen. No gallstones, gallbladder wall thickening, or biliary dilatation. Pancreas: Unremarkable. No pancreatic ductal dilatation or surrounding inflammatory changes. Spleen: Normal in size without significant abnormality. Adrenals/Urinary Tract: Adrenal glands are unremarkable. Severely atrophic left kidney with diminished nephrogram. Multifocal cortical scarring of the right kidney. Multiple small right renal cysts. No hydronephrosis. Bladder is unremarkable. Stomach/Bowel: Stomach is within normal limits. Appendix appears normal. No evidence of bowel wall thickening, distention, or inflammatory changes. Colonic diverticula. Lymphatic: No enlarged abdominal or pelvic lymph nodes. Reproductive: No mass or other significant abnormality. Other: No abdominal wall hernia or abnormality. No abdominopelvic ascites. Musculoskeletal: No acute or significant osseous findings. Review of the MIP images confirms the above findings. IMPRESSION: 1. 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 No CT evidence of acute rupture or other acute aortic pathology. Recommend referral to a vascular specialist. This recommendation follows ACR consensus guidelines: White Paper of the ACR Incidental Findings Committee II on Vascular Findings. J Am Coll Radiol  2013; 10:789-794. 2. Normal contour and caliber of the thoracic aorta. No evidence of thoracic aortic aneurysm, dissection, or other acute aortic pathology. 3. Enlargement of the main pulmonary artery, as can be seen in pulmonary hypertension. 4. Emphysema and diffuse bilateral bronchial wall thickening. 5. Cardiomegaly and coronary artery disease. Aortic Atherosclerosis (ICD10-I70.0) and Emphysema (ICD10-J43.9). Electronically Signed   By: Delanna Ahmadi M.D.   On: 02/04/2021 14:29    Cardiac Studies   See above  Patient Profile  70 y.o. male admitted with symptomatic CHB  Assessment & Plan    CHB - he is stable with an escape of 35/min. He will undergo PPM on Monday, sooner if he becomes unstable. HTN - once his PPM is in place consider addition of a beta blocker Aneurysm - He has a 5.8 x5.4 cm infrarenal aneurysm whic will need to be worked on after the PPM.     For questions or updates, please contact [place] Please consult [email or web address] for contact info under Cardiology/STEMI.      Signed, Cristopher Peru, MD  02/06/2021, 9:44 AM

## 2021-02-06 NOTE — Progress Notes (Signed)
PROGRESS NOTE  Fernando Dean YHC:[id number] DOB: [birth year] DOA: 02/04/2021 PCP: [name], FNP  Brief History   Fernando Dean  is a 70 y.o. 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  Thoracic aorta described as normal in caliber and without dissection.  Pulmonary artery is enlarged suggestive of pulmonary hypertension and he also has emphysematous changes with bronchial thickening, cardiomegaly, and coronary artery calcifications. -Creatinine is 1.86, no recent baseline -WBCs 11.2 , hemoglobin 11.7, platelets 228 -TSH 1.0, potassium 4.6, magnesium pending  The patient was transferred from [facility] ED to [facility] for evaluation of his complete heart block by EP Cardiology.  GI has been consulted. They have recommended pursuing issue of positive FOBTas outpatient as anticoagulation is not an acute issue during this admission.  PPM is planned for Monday or sooner should he become unstable.  Consultants  EP Cardiology  Procedures  None  Antibiotics   Anti-infectives (From admission, onward)    None      Subjective  The patient is resting comfortably. No new complaints.  Objective   Vitals:   Vitals:   02/06/21 0733 02/06/21 1128  BP:  (!) 185/71  Pulse:  (!) 57  Resp:  16  Temp:  (!) 96.2 F (35.7 C)  SpO2: 100% 93%    Exam:  Constitutional:  The patient is awake, alert, and oriented x 3. No acute distress. Respiratory:  No increased work of breathing. No wheezes, rales, or rhonchi No tactile fremitus Cardiovascular:  Regular rate and rhythm - Slow No murmurs, ectopy, or gallups. No lateral PMI. No thrills. Abdomen:  Abdomen is soft, non-tender, non-distended No hernias, masses, or organomegaly Normoactive bowel sounds.  Musculoskeletal:  No cyanosis, clubbing, or edema Cachectic Skin:  No rashes, lesions, ulcers palpation of skin: no induration or nodules Neurologic:  CN 2-12 intact Sensation all 4 extremities intact Psychiatric:  Mental status Mood, affect appropriate Orientation to person, place, time  judgment and insight appear intact I have personally reviewed the following:   Today's Data  Vitals  Lab Data  CBC BMP  Micro Data    Imaging  CTA chest/abdomen/pelvis   Cardiology Data  EKG Echocardiogram  Scheduled Meds:  amLODipine  10 mg Oral Daily   folic acid  1 mg Oral Daily   heparin  5,000 Units Subcutaneous Q8H   multivitamin with minerals  1 tablet Oral Daily   nicotine  21 mg Transdermal Daily   sodium chloride flush  3 mL Intravenous Q12H   sodium chloride flush  3 mL Intravenous Q12H   thiamine  100 mg Oral Daily   Or   thiamine  100 mg Intravenous Daily   umeclidinium-vilanterol  1 puff Inhalation  Daily   Continuous Infusions:  sodium chloride      Principal Problem:   Bradycardia/Complete Heart Block Active Problems:   Heme positive stool   Essential hypertension   Heart block   Alcohol abuse   Tobacco abuse   CKD (chronic kidney disease), stage IIIB (HCC)   LOS: 2 days  1)Bradycardia/Complete Heart Block -General cardiology consult appreciated HR  high 30s and low 40s --EKG shows complete heart  block as well as RBBB and block and LAFB TSH 1.0, potassium 4.6, magnesium 1.8 -Troponin pending -patient needs EP consultation at [facility] -Dr. Chalmers Cater has evaluated the patient for EP Cardiology. I appreciate his assistance. -Avoid negative chronotropic agents -Pacer pads in place -Patient denies syncope, admits to some dizziness, no chest pains ,has some dyspnea on exertion but no dyspnea at rest -Echo demonstrates EF 60-65%. Mild LVH. RV systolic function is normal. There is mildly elevated pulmonary artery systolic pressure. Left atrium is severely dilated. Right atrium is moderately dilated. There is moderate aortic valve regurgitation. -Plan is for PPM on Monday or sooner should the patient become unstable   2) alcohol abuse----patient drinks at least 1 bottle of vodka each day along with beer -High risk for DTs -Scheduled Valium as prescribed -Lorazepam per CIWA protocol along with multivitamin thiamine and folic acid   3) tobacco abuse--smoking cessation advised nicotine patch as prescribed   4) CKD stage -3B -No recent creatinine or renal labs available -Suspect patient probably have stage IIIb CKD at baseline given underlying longstanding hypertension - renally adjust medications, avoid nephrotoxic agents / dehydration  / hypotension -Severely atrophic left kidney. -Cortical scarring of the right kidney.   5)HTN--stage II, okay to start amlodipine 10 mg daily  may use IV Hydralazine 10 mg  Every 4 hours Prn for systolic blood pressure over 160 mmhg -Obviously avoid negative chronotropic agents avoid ACEI/ARB/ARNI due to renal concerns   6)Heme +ve Stool--with mild anemia-  hemoglobin 11.7, currently evaluated by Dr. Jenetta Downer from GI service plans for GI evaluation once bradycardia has been addressed. GI has been consulted. They have recommended pursuing issue of positive FOBTas outpatient as anticoagulation is not an acute issue during this admission.   7) COPD with  pulmonary hypertension suspect some degree of cor pulmonale-- -- Bronchodilators as advised hold off on steroid   8)AAA-CT abdomen and pelvis showed  infrarenal abdominal aortic aneurysm with significant mural thrombus measuring 5.8 x 5.4 cm, no evidence of rupture or dissection-- --EDP spoke with on-call vascular surgeon who advised outpatient vascular surgery follow-up    Disposition/Need for in-Hospital Stay- patient unable to be discharged at this time due to persistent severe bradycardia requiring further EP evaluation,    Status is: Inpatient   Remains inpatient appropriate because: See disposition above   Dispo: The patient is from: Home              Anticipated d/c is to: Home              Anticipated d/c date is: 2 days              Patient currently is not medically stable to d/c.  Fernando Kirks, DO [facility] February 06, 2021  5:00 PM

## 2021-02-06 NOTE — Progress Notes (Signed)
HOSPITAL MEDICINE OVERNIGHT EVENT NOTE    Notified by nursing that patient is exhibiting a yellow mews score.  Patient is currently hospitalized for complete heart block and is due to undergo pacemaker placement prior to discharge.  Of note, patient has been intermittently exhibiting a yellow mews score throughout the hospitalization.  Nursing reports the patient has not experienced any new symptoms and is not experiencing any fever.  No antibiotics or change in care plan indicated at this time.  Continue to monitor.  Vernelle Emerald  MD [facility]

## 2021-02-06 NOTE — Consult Note (Signed)
[facility] Consult: 12:03 PM 02/06/2021  LOS: 2 days    Referring Provider: Dr. Benny Lennert Primary Care Physician:  [name], FNP Dr. Nevada Crane. Primary Gastroenterologist:  Dr. Jenetta Downer    Reason for Consultation: FOBT positive anemia.   HPI: Fernando Dean is a 70 y.o. male.  PMH hypertension.  Tobacco abuse.  2012 hernioraphy.   02/01/2021 office visit with Dr. Jenetta Downer for evaluation FOBT positive stool.  This had been initially reported in 07/2020 on testing at his PCP office no previous EGD or colonoscopy.  Patient denies reflux symptoms, dysphagia, abdominal pain, nausea, vomiting.  Several months ago he was advised to lose weight and decreased his p.o. intake with resulting 40 pound weight loss.  No abdominal swelling, no scrotal edema.  Does not use NSAIDs as they caused tachycardia in the past and he has avoided them for years.  Dr. Loletha Grayer wanted cardiac clearance/evaluation before he would pursue colonoscopy/sedation.  Ordered a CT to evaluate pulsatile mass of the abdomen.  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  Measurement 5.8 x 5.4 cm.  Standard branching pattern of the branch vessels and solitary bilateral renal arteries.  Branch vessels are patent.  Liver, pancreas, gallbladder, biliary tree, stomach normal.  Enlarged pulmonary artery.  Emphysema, bronchial wall thickening.  Cardiomegaly, CAD.  Sent to the [facility] ED for hypertension and bradycardia on 12/15.  Ruled in for complete heart block, PPM planned for Monday.  Plan work-up of the aneurysm after the PPM.  2D echo with LVEF 60 to 65%.  No RV dysfunction mild elevation pulmonary artery pressures.  Dilated left and right atrium.  Calcified aortic valve with moderate regurgitation.  Hb  11.7, MCV 87.  No priors for comparison.  GFR 39.  Troponins 36  Smokes a pack per day.  Consumes at half gallon of vodka every 4 to 5 days.  Marijuana smoking 2-3 times a week. Patient has no recall of any family gastrointestinal issues or cancers.  Past Medical History:  Diagnosis Date   Heme positive stool    Hypertension     Past Surgical History:  Procedure Laterality Date   HERNIA REPAIR     2012    Prior to Admission medications   Medication Sig Start Date End Date Taking? Authorizing Provider  Multiple Vitamins-Minerals (MULTIVITAMIN WITH MINERALS) tablet Take 1 tablet by mouth daily.   Yes [provider]    Scheduled Meds:  amLODipine  10 mg Oral Daily   diazepam  2.5 mg Oral TID   folic acid  1 mg Oral Daily   heparin  5,000 Units Subcutaneous Q8H   multivitamin with minerals  1 tablet Oral Daily   nicotine  21 mg Transdermal Daily   sodium chloride flush  3 mL Intravenous Q12H   sodium chloride flush  3 mL Intravenous Q12H   thiamine  100 mg Oral Daily   Or   thiamine  100 mg Intravenous Daily   umeclidinium-vilanterol  1 puff Inhalation Daily   Infusions:  sodium chloride  PRN Meds: sodium chloride, acetaminophen **OR** acetaminophen, albuterol, bisacodyl, hydrALAZINE, LORazepam **OR** LORazepam, ondansetron **OR** ondansetron (ZOFRAN) IV, polyethylene glycol, sodium chloride flush, traZODone   Allergies as of 02/04/2021 - Review Complete 02/04/2021  Allergen Reaction Noted   Advil [ibuprofen] Shortness Of Breath and Palpitations 02/01/2021   Aleve [naproxen] Shortness Of Breath 02/01/2021   Nsaids Shortness Of Breath 02/01/2021    Family History  Problem Relation Age of Onset   Bladder Cancer Mother    Heart disease Father     Social History   Socioeconomic History   Marital status: Single    Spouse name: Not on file   Number of children: Not on file   Years of education: Not on file   Highest education level: Not on file   Occupational History   Not on file  Tobacco Use   Smoking status: Every Day    Packs/day: 1.00    Types: Cigarettes   Smokeless tobacco: Never  Vaping Use   Vaping Use: Never used  Substance and Sexual Activity   Alcohol use: Yes    Comment: daily liquor moderatly   Drug use: Not on file   Sexual activity: Not on file  Other Topics Concern   Not on file  Social History Narrative   Not on file   Social Determinants of Health   Financial Resource Strain: Not on file  Food Insecurity: Not on file  Transportation Needs: Not on file  Physical Activity: Not on file  Stress: Not on file  Social Connections: Not on file  Intimate Partner Violence: Not on file    REVIEW OF SYSTEMS: Constitutional: No weakness, no fatigue. ENT:  No nose bleeds Pulm: No profound shortness of breath or cough. CV:  No palpitations, no LE edema.  No angina.  No sense of irregular heartbeat. GU:  No hematuria, no frequency GI: See HPI. Heme: Usual or excessive bleeding or bruising. Transfusions: No blood product transfusions in past. Neuro:  No headaches, no peripheral tingling or numbness Derm:  No itching, no rash or sores.  Endocrine:  No sweats or chills.  No polyuria or dysuria Immunization: Reviewed. Travel:  None beyond local counties in last few months.    PHYSICAL EXAM: Vital signs in last 24 hours: Vitals:   02/06/21 0733 02/06/21 1128  BP:  (!) 185/71  Pulse:  (!) 57  Resp:  16  Temp:  (!) 96.2 F (35.7 C)  SpO2: 100% 93%   Wt Readings from Last 3 Encounters:  02/04/21 74.3 kg  02/01/21 76 kg    General: Pleasant, well-appearing, comfortable, alert Head: No facial asymmetry or swelling.  No signs of head trauma. Eyes: No conjunctival pallor.  No scleral icterus. Ears: Not hard of hearing Nose: No congestion or discharge Mouth: Oropharynx moist, pink, clear.  Tongue midline. Neck: No JVD, no masses, no thyromegaly Lungs: Clear bilaterally with good breath sounds.  No  labored breathing or cough Heart: Only monitor with heart rate of 38 ventricular rhythm. Abdomen: Soft.  Not tender, not distended.  No HSM, masses, bruits, hernias.  Prominent abdominal pulsation. Rectal: Deferred Musc/Skeltl: No joint redness, swelling or gross deformities. Extremities: No CCE. Neurologic: Fully oriented x3.  No gross deficits or weakness.  Moves all 4 limbs.  No tremors. Skin: No rash, no sores, no suspicious lesions. Nodes: No cervical adenopathy. Psych: Calm, pleasant, cooperative.  Intake/Output from previous day: 12/16 0701 - 12/17 0700 In: 240 [P.O.:240] Out: 525 [Urine:525] Intake/Output this shift: Total I/O In:  240 [P.O.:240] Out: 200 [Urine:200]  LAB RESULTS: Recent Labs    02/04/21 1228 02/05/21 0315 02/06/21 0159  WBC 11.2* 8.2 6.7  HGB 11.7* 10.5* 11.2*  HCT 38.4* 34.2* 36.8*  PLT 228 179 193   BMET Lab Results  Component Value Date   NA 136 02/06/2021   NA 136 02/05/2021   NA 137 02/04/2021   K 3.9 02/06/2021   K 4.1 02/05/2021   K 4.6 02/04/2021   CL 106 02/06/2021   CL 108 02/05/2021   CL 107 02/04/2021   CO2 21 (L) 02/06/2021   CO2 20 (L) 02/05/2021   CO2 19 (L) 02/04/2021   GLUCOSE 96 02/06/2021   GLUCOSE 85 02/05/2021   GLUCOSE 106 (H) 02/04/2021   BUN 19 02/06/2021   BUN 20 02/05/2021   BUN 25 (H) 02/04/2021   CREATININE 1.82 (H) 02/06/2021   CREATININE 1.82 (H) 02/05/2021   CREATININE 1.86 (H) 02/04/2021   CALCIUM 9.1 02/06/2021   CALCIUM 9.0 02/05/2021   CALCIUM 9.2 02/04/2021   LFT Recent Labs    02/04/21 1228 02/05/21 0315 02/06/21 0159  PROT 8.4*  --  6.8  ALBUMIN 3.5 2.7* 2.7*  AST 22  --  20  ALT 12  --  12  ALKPHOS 133*  --  117  BILITOT 1.0  --  0.6   PT/INR No results found for: INR, PROTIME Hepatitis Panel No results for input(s): HEPBSAG, HCVAB, HEPAIGM, HEPBIGM in the last 72 hours. C-Diff No components found for: CDIFF Lipase  No results found for: LIPASE  Drugs of Abuse  No results  found for: LABOPIA, COCAINSCRNUR, LABBENZ, AMPHETMU, THCU, LABBARB   RADIOLOGY STUDIES: DG Chest Port 1 View  Result Date: 02/04/2021 CLINICAL DATA:  Weak EXAM: PORTABLE CHEST 1 VIEW COMPARISON:  02/14/2004 FINDINGS: The heart size and mediastinal contours are within normal limits. Both lungs are clear. The visualized skeletal structures are unremarkable. IMPRESSION: No acute abnormality of the lungs in AP portable projection. Electronically Signed   By: Delanna Ahmadi M.D.   On: 02/04/2021 13:18   ECHOCARDIOGRAM COMPLETE  Result Date: 02/05/2021    ECHOCARDIOGRAM REPORT   Patient Name:   Fernando Dean Date of Exam: 02/05/2021 Medical Rec #:  [id number]        Height:       71.0 in Accession #:    [id number]       Weight:       163.8 lb Date of Birth:  Jul 05, 1950        BSA:          1.937 m Patient Age:    15 years         BP:           162/57 mmHg Patient Gender: M                HR:           37 bpm. Exam Location:  Inpatient Procedure: 2D Echo, 3D Echo, Cardiac Doppler and Color Doppler Indications:    Dyspnea  History:        Patient has no prior history of Echocardiogram examinations.                 Risk Factors:Hypertension.  Sonographer:    Bernadene Person RDCS Referring Phys: [place]  1. Left ventricular ejection fraction, by estimation, is 60 to 65%. The left ventricle has normal function. The left ventricle has no regional wall motion abnormalities.  There is mild left ventricular hypertrophy. Left ventricular diastolic parameters are indeterminate.  2. Right ventricular systolic function is normal. The right ventricular size is not well visualized. There is mildly elevated pulmonary artery systolic pressure.  3. Left atrial size was severely dilated.  4. Right atrial size was moderately dilated.  5. The mitral valve is grossly normal. Trivial mitral valve regurgitation.  6. AI jet against the anterior mitral valve leaflet. Jet eccentric P1/2T may underestimate. The  aortic valve is calcified. Aortic valve regurgitation is moderate.  7. The inferior vena cava is normal in size with greater than 50% respiratory variability, suggesting right atrial pressure of 3 mmHg. FINDINGS  Left Ventricle: Left ventricular ejection fraction, by estimation, is 60 to 65%. The left ventricle has normal function. The left ventricle has no regional wall motion abnormalities. The left ventricular internal cavity size was normal in size. There is  mild left ventricular hypertrophy. Left ventricular diastolic parameters are indeterminate. Right Ventricle: The right ventricular size is not well visualized. No increase in right ventricular wall thickness. Right ventricular systolic function is normal. There is mildly elevated pulmonary artery systolic pressure. The tricuspid regurgitant velocity is 2.93 m/s, and with an assumed right atrial pressure of 3 mmHg, the estimated right ventricular systolic pressure is 82.8 mmHg. Left Atrium: Left atrial size was severely dilated. Right Atrium: Right atrial size was moderately dilated. Pericardium: There is no evidence of pericardial effusion. Mitral Valve: The mitral valve is grossly normal. Trivial mitral valve regurgitation. Tricuspid Valve: The tricuspid valve is grossly normal. Tricuspid valve regurgitation is mild. Aortic Valve: AI jet against the anterior mitral valve leaflet. Jet eccentric P1/2T may underestimate. The aortic valve is calcified. Aortic valve regurgitation is moderate. Aortic regurgitation PHT measures 893 msec. Aortic valve mean gradient measures 7.0 mmHg. Aortic valve peak gradient measures 16.8 mmHg. Aortic valve area, by VTI measures 3.20 cm. Pulmonic Valve: The pulmonic valve was not well visualized. Pulmonic valve regurgitation is trivial. Aorta: The aortic root and ascending aorta are structurally normal, with no evidence of dilitation. Venous: The inferior vena cava is normal in size with greater than 50% respiratory  variability, suggesting right atrial pressure of 3 mmHg. IAS/Shunts: No atrial level shunt detected by color flow Doppler.  LEFT VENTRICLE PLAX 2D LVIDd:         5.80 cm LVIDs:         3.80 cm LV PW:         1.30 cm LV IVS:        1.00 cm LVOT diam:     2.10 cm      3D Volume EF: LV SV:         130          3D EF:        58 % LV SV Index:   67           LV EDV:       212 ml LVOT Area:     3.46 cm     LV ESV:       89 ml                             LV SV:        123 ml  LV Volumes (MOD) LV vol d, MOD A2C: 174.0 ml LV vol d, MOD A4C: 205.0 ml LV vol s, MOD A2C: 76.5 ml LV vol s, MOD A4C:  85.7 ml LV SV MOD A2C:     97.5 ml LV SV MOD A4C:     205.0 ml LV SV MOD BP:      107.7 ml RIGHT VENTRICLE TAPSE (M-mode): 1.8 cm LEFT ATRIUM              Index        RIGHT ATRIUM           Index LA diam:        4.30 cm  2.22 cm/m   RA Area:     19.00 cm LA Vol (A2C):   74.4 ml  38.41 ml/m  RA Volume:   58.00 ml  29.94 ml/m LA Vol (A4C):   118.0 ml 60.92 ml/m LA Biplane Vol: 97.7 ml  50.44 ml/m  AORTIC VALVE AV Area (Vmax):    2.99 cm AV Area (Vmean):   2.91 cm AV Area (VTI):     3.20 cm AV Vmax:           205.00 cm/s AV Vmean:          115.000 cm/s AV VTI:            0.405 m AV Peak Grad:      16.8 mmHg AV Mean Grad:      7.0 mmHg LVOT Vmax:         177.00 cm/s LVOT Vmean:        96.600 cm/s LVOT VTI:          0.374 m LVOT/AV VTI ratio: 0.92 AI PHT:            893 msec AR Vena Contracta: 0.40 cm  AORTA Ao Root diam: 3.50 cm Ao Asc diam:  3.80 cm TRICUSPID VALVE TR Peak grad:   34.3 mmHg TR Vmax:        293.00 cm/s  SHUNTS Systemic VTI:  0.37 m Systemic Diam: 2.10 cm Phineas Inches Electronically signed by Phineas Inches Signature Date/Time: 02/05/2021/12:35:37 PM    Final    CT Angio Chest/Abd/Pel for Dissection W and/or Wo Contrast  Result Date: 02/04/2021 CLINICAL DATA:  Aortic aneurysm EXAM: CT ANGIOGRAPHY CHEST, ABDOMEN AND PELVIS TECHNIQUE: Non-contrast CT of the chest was initially obtained. Multidetector CT imaging  through the chest, abdomen and pelvis was performed using the standard protocol during bolus administration of intravenous contrast. Multiplanar reconstructed images and MIPs were obtained and reviewed to evaluate the vascular anatomy. CONTRAST:  1mL OMNIPAQUE IOHEXOL 350 MG/ML SOLN COMPARISON:  07/21/2011 FINDINGS: CTA CHEST FINDINGS Cardiovascular: Preferential opacification of the thoracic aorta. Normal contour and caliber of the thoracic aorta without evidence of aneurysm, dissection, or other acute aortic pathology. Mild cardiomegaly. Left and right coronary artery calcifications. No pericardial effusion. Enlargement of the main pulmonary artery measuring up to 4.2 cm. Moderate mixed aortic atherosclerosis. Mediastinum/Nodes: No enlarged mediastinal, hilar, or axillary lymph nodes. Thyroid gland, trachea, and esophagus demonstrate no significant findings. Lungs/Pleura: Moderate to severe centrilobular emphysema. Diffuse bilateral bronchial wall thickening. No pleural effusion or pneumothorax. Musculoskeletal: No chest wall abnormality. No acute or significant osseous findings. Review of the MIP images confirms the above findings. 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 No CT evidence of acute rupture or other acute aortic pathology. Standard branching pattern of the abdominal aorta with solitary bilateral renal arteries. The branch vessel origins are patent  and opacified. Review of the MIP images confirms the above findings. NON-VASCULAR Hepatobiliary: No solid liver abnormality is seen. No gallstones, gallbladder wall thickening, or biliary dilatation. Pancreas: Unremarkable. No pancreatic ductal dilatation or surrounding inflammatory changes. Spleen: Normal in size without significant abnormality. Adrenals/Urinary Tract: Adrenal glands are  unremarkable. Severely atrophic left kidney with diminished nephrogram. Multifocal cortical scarring of the right kidney. Multiple small right renal cysts. No hydronephrosis. Bladder is unremarkable. Stomach/Bowel: Stomach is within normal limits. Appendix appears normal. No evidence of bowel wall thickening, distention, or inflammatory changes. Colonic diverticula. Lymphatic: No enlarged abdominal or pelvic lymph nodes. Reproductive: No mass or other significant abnormality. Other: No abdominal wall hernia or abnormality. No abdominopelvic ascites. Musculoskeletal: No acute or significant osseous findings. Review of the MIP images confirms the above findings. IMPRESSION: 1. 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 No CT evidence of acute rupture or other acute aortic pathology. Recommend referral to a vascular specialist. This recommendation follows ACR consensus guidelines: White Paper of the ACR Incidental Findings Committee II on Vascular Findings. J Am Coll Radiol 2013; 10:789-794. 2. Normal contour and caliber of the thoracic aorta. No evidence of thoracic aortic aneurysm, dissection, or other acute aortic pathology. 3. Enlargement of the main pulmonary artery, as can be seen in pulmonary hypertension. 4. Emphysema and diffuse bilateral bronchial wall thickening. 5. Cardiomegaly and coronary artery disease. Aortic Atherosclerosis (ICD10-I70.0) and Emphysema (ICD10-J43.9). Electronically Signed   By: Delanna Ahmadi M.D.   On: 02/04/2021 14:29     IMPRESSION:   [place] anemia.  FOBT positive, had been FOBT positive in summer 2022, not rechecked this admission.  Dr. Jenetta Downer planned at least colonoscopy once patient underwent cardiac clearance.  Complete heart block.  Pacemaker planned for 12/19     Infrarenal AAA.      Hypertension.  Alcohol abuse.  Liver, pancreas, biliary tree and normal on CT scan.  LFTs normal.  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    PLAN:     Patient has "a lot on his plate".  I do not think now is the time to pursue colonoscopy and/or EGD though could be done if necessary.  Needs the pacemaker placed and needs plan regarding his large aneurysm     Add PPI??    Azucena Freed  02/06/2021, 12:03 PM Phone [phone]

## 2021-02-06 NOTE — Plan of Care (Signed)
°  Problem: Clinical Measurements: °Goal: Ability to maintain clinical measurements within normal limits will improve °Outcome: Progressing °Goal: Cardiovascular complication will be avoided °Outcome: Progressing °  °Problem: Activity: °Goal: Risk for activity intolerance will decrease °Outcome: Progressing °  °Problem: Nutrition: °Goal: Adequate nutrition will be maintained °Outcome: Progressing °  °

## 2021-02-07 LAB — BASIC METABOLIC PANEL
Anion gap: 7 (ref 5–15)
BUN: 21 mg/dL (ref 8–23)
CO2: 18 mmol/L — ABNORMAL LOW (ref 22–32)
Calcium: 8.8 mg/dL — ABNORMAL LOW (ref 8.9–10.3)
Chloride: 109 mmol/L (ref 98–111)
Creatinine, Ser: 1.74 mg/dL — ABNORMAL HIGH (ref 0.61–1.24)
GFR, Estimated: 42 mL/min — ABNORMAL LOW (ref >60)
Glucose, Bld: 94 mg/dL (ref 70–99)
Potassium: 4 mmol/L (ref 3.5–5.1)
Sodium: 134 mmol/L — ABNORMAL LOW (ref 135–145)

## 2021-02-07 LAB — SURGICAL PCR SCREEN
MRSA, PCR: NEGATIVE
Staphylococcus aureus: NEGATIVE

## 2021-02-07 LAB — HEMOGLOBIN AND HEMATOCRIT, BLOOD
HCT: 35.1 % — ABNORMAL LOW (ref 39.0–52.0)
Hemoglobin: 11.3 g/dL — ABNORMAL LOW (ref 13.0–17.0)

## 2021-02-07 MED ORDER — SODIUM CHLORIDE 0.9 % IV SOLN
80.0000 mg | INTRAVENOUS | Status: AC
  Administered 2021-02-08: 09:00:00 80 mg
  Filled 2021-02-07: qty 2

## 2021-02-07 MED ORDER — SODIUM CHLORIDE 0.9 % IV SOLN: INTRAVENOUS | Status: DC

## 2021-02-07 MED ORDER — CEFAZOLIN SODIUM-DEXTROSE 2-4 GM/100ML-% IV SOLN
2.0000 g | INTRAVENOUS | Status: AC
  Administered 2021-02-08: 08:00:00 2 g via INTRAVENOUS
  Filled 2021-02-07: qty 100

## 2021-02-07 NOTE — Progress Notes (Signed)
PROGRESS NOTE  [name] CXK:[id number] DOB: April 28, 1950 DOA: 02/04/2021 PCP: [name], FNP  Brief History   Fernando Dean  is a 70 y.o. 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  Thoracic aorta described as normal in caliber and without dissection.  Pulmonary artery is enlarged suggestive of pulmonary hypertension and he also has emphysematous changes with bronchial thickening, cardiomegaly, and coronary artery calcifications. -Creatinine is 1.86, no recent baseline -WBCs 11.2 , hemoglobin 11.7, platelets 228 -TSH 1.0, potassium 4.6, magnesium pending  The patient was transferred from [facility] ED to [facility] for evaluation of his complete heart block by EP Cardiology.  GI has been consulted. They have recommended pursuing issue of positive FOBTas outpatient as anticoagulation is not an acute issue during this admission.  PPM is planned for Monday or sooner should he become unstable.  Consultants  EP Cardiology  Procedures  None  Antibiotics   Anti-infectives (From admission, onward)    None      Subjective  The patient is resting comfortably. No new complaints.  Objective   Vitals:   Vitals:   02/07/21 0350 02/07/21 1127  BP:  (!) 167/73  Pulse:  (!) 44  Resp: 17 18  Temp: 98.9 F (37.2 C) 98.7 F (37.1 C)  SpO2: 99% 100%    Exam:  Constitutional:  The patient is awake, alert, and oriented x 3. No acute distress. Respiratory:  No increased work of breathing. No wheezes, rales, or rhonchi No tactile fremitus Cardiovascular:  Regular rate and rhythm - Slow No murmurs, ectopy, or gallups. No lateral PMI. No thrills. Abdomen:  Abdomen is soft, non-tender, non-distended No hernias, masses, or organomegaly Normoactive bowel sounds.  Musculoskeletal:  No cyanosis, clubbing, or edema Cachectic Skin:  No rashes, lesions, ulcers palpation of skin: no induration or nodules Neurologic:  CN 2-12 intact Sensation all 4 extremities intact Psychiatric:  Mental status Mood, affect appropriate Orientation to person, place, time  judgment and insight appear intact I have personally reviewed the following:   Today's Data  Vitals  Lab Data  CBC BMP  Micro Data    Imaging  CTA chest/abdomen/pelvis   Cardiology Data  EKG Echocardiogram  Scheduled Meds:  amLODipine  10 mg Oral Daily   folic acid  1 mg Oral Daily   heparin  5,000 Units Subcutaneous Q8H   multivitamin with minerals  1 tablet Oral Daily   nicotine  21 mg Transdermal Daily   sodium chloride flush  3 mL Intravenous Q12H   sodium chloride flush  3 mL Intravenous Q12H   thiamine  100 mg Oral Daily   Or   thiamine  100 mg Intravenous Daily   umeclidinium-vilanterol  1  puff Inhalation Daily   Continuous Infusions:  sodium chloride     [START ON 02/08/2021] sodium chloride      Principal Problem:   Bradycardia/Complete Heart Block Active Problems:   Heme positive stool   Essential hypertension   Heart block   Alcohol abuse   Tobacco abuse   CKD (chronic kidney disease), stage IIIB (HCC)   LOS: 3 days  1)Bradycardia/Complete Heart Block -General cardiology consult  appreciated HR  high 30s and low 40s --EKG shows complete heart block as well as RBBB and block and LAFB TSH 1.0, potassium 4.6, magnesium 1.8 -Troponin pending -patient needs EP consultation at [facility] -Dr. Chalmers Cater has evaluated the patient for EP Cardiology. I appreciate his assistance. -Avoid negative chronotropic agents -Pacer pads in place -Patient denies syncope, admits to some dizziness, no chest pains ,has some dyspnea on exertion but no dyspnea at rest -Echo demonstrates EF 60-65%. Mild LVH. RV systolic function is normal. There is mildly elevated pulmonary artery systolic pressure. Left atrium is severely dilated. Right atrium is moderately dilated. There is moderate aortic valve regurgitation. -Plan is for PPM on Monday or sooner should the patient become unstable   2) alcohol abuse----patient drinks at least 1 bottle of vodka each day along with beer -High risk for DTs -Scheduled Valium as prescribed -Lorazepam per CIWA protocol along with multivitamin thiamine and folic acid   3) tobacco abuse--smoking cessation advised nicotine patch as prescribed   4) CKD stage -3B -No recent creatinine or renal labs available -Suspect patient probably have stage IIIb CKD at baseline given underlying longstanding hypertension - renally adjust medications, avoid nephrotoxic agents / dehydration  / hypotension -Severely atrophic left kidney. -Cortical scarring of the right kidney. -Creatinine 1.74 today.   5)HTN--stage II, okay to start amlodipine 10 mg daily  may use IV Hydralazine 10 mg  Every 4 hours Prn for systolic blood pressure over 160 mmhg -Obviously avoid negative chronotropic agents avoid ACEI/ARB/ARNI due to renal concerns   6)Heme +ve Stool--with mild anemia-  hemoglobin 11.7, currently evaluated by Dr. Jenetta Downer from GI service plans for GI evaluation once bradycardia has been addressed. GI has been consulted. They have recommended pursuing issue of positive FOBTas  outpatient as anticoagulation is not an acute issue during this admission.   7) COPD with pulmonary hypertension suspect some degree of cor pulmonale-- -- Bronchodilators as advised hold off on steroid   8)AAA-CT abdomen and pelvis showed  infrarenal abdominal aortic aneurysm with significant mural thrombus measuring 5.8 x 5.4 cm, no evidence of rupture or dissection-- --EDP spoke with on-call vascular surgeon who advised outpatient vascular surgery follow-up    Disposition/Need for in-Hospital Stay- patient unable to be discharged at this time due to persistent severe bradycardia requiring further EP evaluation,    Status is: Inpatient   Remains inpatient appropriate because: See disposition above   Dispo: The patient is from: Home              Anticipated d/c is to: Home              Anticipated d/c date is: 2 days              Patient currently is not medically stable to d/c.  Karie Kirks, DO [facility] February 07, 2021  4:10 PM

## 2021-02-07 NOTE — Progress Notes (Signed)
Progress Note  Patient Name: Fernando Dean Date of Encounter: 02/07/2021  Primary Cardiologist: None   Subjective   No chest pain or sob.   Inpatient Medications    Scheduled Meds:  amLODipine  10 mg Oral Daily   folic acid  1 mg Oral Daily   heparin  5,000 Units Subcutaneous Q8H   multivitamin with minerals  1 tablet Oral Daily   nicotine  21 mg Transdermal Daily   sodium chloride flush  3 mL Intravenous Q12H   sodium chloride flush  3 mL Intravenous Q12H   thiamine  100 mg Oral Daily   Or   thiamine  100 mg Intravenous Daily   umeclidinium-vilanterol  1 puff Inhalation Daily   Continuous Infusions:  sodium chloride     PRN Meds: sodium chloride, acetaminophen **OR** acetaminophen, albuterol, bisacodyl, hydrALAZINE, LORazepam **OR** LORazepam, ondansetron **OR** ondansetron (ZOFRAN) IV, polyethylene glycol, sodium chloride flush, traZODone   Vital Signs    Vitals:   02/06/21 1128 02/06/21 1913 02/07/21 0043 02/07/21 0350  BP: (!) 185/71 (!) 186/67 (!) 168/64   Pulse: (!) 57  (!) 40   Resp: 16 17 16 17   Temp: (!) 96.2 F (35.7 C) 97.7 F (36.5 C) 98 F (36.7 C) 98.9 F (37.2 C)  TempSrc:  Oral Oral Oral  SpO2: 93% 100% 99% 99%  Weight:      Height:        Intake/Output Summary (Last 24 hours) at 02/07/2021 1059 Last data filed at 02/07/2021 0040 Gross per 24 hour  Intake 723 ml  Output 675 ml  Net 48 ml   Filed Weights   02/04/21 1223 02/04/21 2111  Weight: 75.8 kg 74.3 kg    Telemetry    Nsr with CHB - Personally Reviewed  ECG    none - Personally Reviewed  Physical Exam   GEN: No acute distress.   Neck: No JVD Cardiac: Reg brady, no murmurs, rubs, or gallops.  Respiratory: Clear to auscultation bilaterally. GI: Soft, nontender, non-distended  MS: No edema; No deformity. Neuro:  Nonfocal  Psych: Normal affect   Labs    Chemistry Recent Labs  Lab 02/04/21 1228 02/05/21 0315 02/06/21 0159 02/07/21 0347  NA 137 136 136  134*  K 4.6 4.1 3.9 4.0  CL 107 108 106 109  CO2 19* 20* 21* 18*  GLUCOSE 106* 85 96 94  BUN 25* 20 19 21   CREATININE 1.86* 1.82* 1.82* 1.74*  CALCIUM 9.2 9.0 9.1 8.8*  PROT 8.4*  --  6.8  --   ALBUMIN 3.5 2.7* 2.7*  --   AST 22  --  20  --   ALT 12  --  12  --   ALKPHOS 133*  --  117  --   BILITOT 1.0  --  0.6  --   GFRNONAA 38* 39* 39* 42*  ANIONGAP 11 8 9 7      Hematology Recent Labs  Lab 02/04/21 1228 02/05/21 0315 02/06/21 0159 02/07/21 0347  WBC 11.2* 8.2 6.7  --   RBC 4.41 4.12* 4.44  --   HGB 11.7* 10.5* 11.2* 11.3*  HCT 38.4* 34.2* 36.8* 35.1*  MCV 87.1 83.0 82.9  --   MCH 26.5 25.5* 25.2*  --   MCHC 30.5 30.7 30.4  --   RDW 16.3* 15.9* 16.0*  --   PLT 228 179 193  --     Cardiac EnzymesNo results for input(s): TROPONINI in the last 168 hours. No results for  input(s): TROPIPOC in the last 168 hours.   BNPNo results for input(s): BNP, PROBNP in the last 168 hours.   DDimer No results for input(s): DDIMER in the last 168 hours.   Radiology    No results found.  Cardiac Studies   2D echo with normal LV functin  Patient Profile     70 y.o. male admitted with CHB also found to have a large infrarenal aortic aneurysm.  Assessment & Plan    CHB - he will undergo PPM insertion tomorrow. NPO after midnight.  Infrarenal aortic aneurysm - this will need to be treated in the coming weeks.  HTN -his bp is elevated. We will add coreg after the PM. Will give some hydralazine today.     For questions or updates, please contact [place] Please consult [email or web address] for contact info under Cardiology/STEMI.      Signed, Cristopher Peru, MD  02/07/2021, 10:59 AM

## 2021-02-08 ENCOUNTER — Inpatient Hospital Stay (HOSPITAL_COMMUNITY)
Admission: EM | Disposition: A | Discharge status: Home / Self Care | Payer: Self-pay | Source: Home / Self Care | Attending: Internal Medicine

## 2021-02-08 ENCOUNTER — Encounter (HOSPITAL_COMMUNITY): Payer: Self-pay | Admitting: Internal Medicine

## 2021-02-08 DIAGNOSIS — I442 Atrioventricular block, complete: Secondary | ICD-10-CM

## 2021-02-08 DIAGNOSIS — Z950 Presence of cardiac pacemaker: Secondary | ICD-10-CM

## 2021-02-08 HISTORY — DX: Presence of cardiac pacemaker: Z95.0

## 2021-02-08 HISTORY — PX: PACEMAKER IMPLANT: EP1218

## 2021-02-08 LAB — BASIC METABOLIC PANEL
Anion gap: 7 (ref 5–15)
BUN: 18 mg/dL (ref 8–23)
CO2: 19 mmol/L — ABNORMAL LOW (ref 22–32)
Calcium: 8.8 mg/dL — ABNORMAL LOW (ref 8.9–10.3)
Chloride: 107 mmol/L (ref 98–111)
Creatinine, Ser: 1.7 mg/dL — ABNORMAL HIGH (ref 0.61–1.24)
GFR, Estimated: 43 mL/min — ABNORMAL LOW (ref >60)
Glucose, Bld: 95 mg/dL (ref 70–99)
Potassium: 4 mmol/L (ref 3.5–5.1)
Sodium: 133 mmol/L — ABNORMAL LOW (ref 135–145)

## 2021-02-08 LAB — CBC WITH DIFFERENTIAL/PLATELET
Abs Immature Granulocytes: 0.02 10*3/uL (ref 0.00–0.07)
Basophils Absolute: 0.1 10*3/uL (ref 0.0–0.1)
Basophils Relative: 1 %
Eosinophils Absolute: 0.6 10*3/uL — ABNORMAL HIGH (ref 0.0–0.5)
Eosinophils Relative: 10 %
HCT: 36.2 % — ABNORMAL LOW (ref 39.0–52.0)
Hemoglobin: 11.1 g/dL — ABNORMAL LOW (ref 13.0–17.0)
Immature Granulocytes: 0 %
Lymphocytes Relative: 35 %
Lymphs Abs: 2.1 10*3/uL (ref 0.7–4.0)
MCH: 25.4 pg — ABNORMAL LOW (ref 26.0–34.0)
MCHC: 30.7 g/dL (ref 30.0–36.0)
MCV: 82.8 fL (ref 80.0–100.0)
Monocytes Absolute: 0.8 10*3/uL (ref 0.1–1.0)
Monocytes Relative: 13 %
Neutro Abs: 2.5 10*3/uL (ref 1.7–7.7)
Neutrophils Relative %: 41 %
Platelets: 188 10*3/uL (ref 150–400)
RBC: 4.37 MIL/uL (ref 4.22–5.81)
RDW: 16.4 % — ABNORMAL HIGH (ref 11.5–15.5)
WBC: 6.1 10*3/uL (ref 4.0–10.5)
nRBC: 0 % (ref 0.0–0.2)

## 2021-02-08 SURGERY — PACEMAKER IMPLANT

## 2021-02-08 MED ORDER — MIDAZOLAM HCL 5 MG/5ML IJ SOLN
INTRAMUSCULAR | Status: AC
  Filled 2021-02-08: qty 5

## 2021-02-08 MED ORDER — ONDANSETRON HCL 4 MG/2ML IJ SOLN: 4.0000 mg | Freq: Four times a day (QID) | INTRAMUSCULAR | Status: DC | PRN

## 2021-02-08 MED ORDER — SODIUM CHLORIDE 0.9 % IV SOLN: INTRAVENOUS | Status: DC

## 2021-02-08 MED ORDER — CEFAZOLIN SODIUM-DEXTROSE 2-4 GM/100ML-% IV SOLN
INTRAVENOUS | Status: AC
  Filled 2021-02-08: qty 100

## 2021-02-08 MED ORDER — FENTANYL CITRATE (PF) 100 MCG/2ML IJ SOLN
INTRAMUSCULAR | Status: AC
  Filled 2021-02-08: qty 2

## 2021-02-08 MED ORDER — HEPARIN (PORCINE) IN NACL 1000-0.9 UT/500ML-% IV SOLN
INTRAVENOUS | Status: AC
  Filled 2021-02-08: qty 1000

## 2021-02-08 MED ORDER — CEFAZOLIN SODIUM-DEXTROSE 1-4 GM/50ML-% IV SOLN
1.0000 g | Freq: Four times a day (QID) | INTRAVENOUS | Status: AC
  Administered 2021-02-08 – 2021-02-09 (×3): 1 g via INTRAVENOUS
  Filled 2021-02-08 (×3): qty 50

## 2021-02-08 MED ORDER — MIDAZOLAM HCL 5 MG/5ML IJ SOLN
INTRAMUSCULAR | Status: DC | PRN
  Administered 2021-02-08: 1 mg via INTRAVENOUS

## 2021-02-08 MED ORDER — LIDOCAINE HCL 1 % IJ SOLN
INTRAMUSCULAR | Status: AC
  Filled 2021-02-08: qty 60

## 2021-02-08 MED ORDER — ACETAMINOPHEN 325 MG PO TABS: 325.0000 mg | ORAL_TABLET | ORAL | Status: DC | PRN

## 2021-02-08 MED ORDER — SODIUM CHLORIDE 0.9 % IV SOLN
INTRAVENOUS | Status: AC
  Filled 2021-02-08: qty 2

## 2021-02-08 MED ORDER — LIDOCAINE HCL (PF) 1 % IJ SOLN
INTRAMUSCULAR | Status: DC | PRN
  Administered 2021-02-08: 60 mL

## 2021-02-08 MED ORDER — HEPARIN (PORCINE) IN NACL 1000-0.9 UT/500ML-% IV SOLN
INTRAVENOUS | Status: DC | PRN
  Administered 2021-02-08: 500 mL

## 2021-02-08 MED ORDER — FENTANYL CITRATE (PF) 100 MCG/2ML IJ SOLN
INTRAMUSCULAR | Status: DC | PRN
  Administered 2021-02-08: 25 ug via INTRAVENOUS

## 2021-02-08 SURGICAL SUPPLY — 7 items
CABLE SURGICAL S-101-97-12 (CABLE) ×4 IMPLANT
LEAD TENDRIL MRI 52CM LPA1200M (Lead) ×2 IMPLANT
LEAD TENDRIL MRI 58CM LPA1200M (Lead) ×2 IMPLANT
PACEMAKER ASSURITY DR-RF (Pacemaker) ×2 IMPLANT
PAD DEFIB RADIO PHYSIO CONN (PAD) ×4 IMPLANT
SHEATH 8FR PRELUDE SNAP 13 (SHEATH) ×4 IMPLANT
TRAY PACEMAKER INSERTION (PACKS) ×4 IMPLANT

## 2021-02-08 NOTE — Progress Notes (Signed)
Progress Note  Patient Name: Fernando Dean Date of Encounter: 02/08/2021  Primary Cardiologist: None   Patient Profile     70 y.o. male admitted with symptomatic CHB; also found to have aneurysm   Subjective   No chest pain or shortness of breath   Inpatient Medications    Scheduled Meds:  amLODipine  10 mg Oral Daily   folic acid  1 mg Oral Daily   gentamicin irrigation  80 mg Irrigation On Call   multivitamin with minerals  1 tablet Oral Daily   nicotine  21 mg Transdermal Daily   sodium chloride flush  3 mL Intravenous Q12H   sodium chloride flush  3 mL Intravenous Q12H   thiamine  100 mg Oral Daily   Or   thiamine  100 mg Intravenous Daily   umeclidinium-vilanterol  1 puff Inhalation Daily   Continuous Infusions:  sodium chloride     sodium chloride     sodium chloride      ceFAZolin (ANCEF) IV     PRN Meds: sodium chloride, acetaminophen **OR** acetaminophen, albuterol, bisacodyl, hydrALAZINE, ondansetron **OR** ondansetron (ZOFRAN) IV, polyethylene glycol, sodium chloride flush, traZODone   Vital Signs    Vitals:   02/07/21 1127 02/07/21 2127 02/08/21 0040 02/08/21 0618  BP: (!) 167/73 (!) 171/78 (!) 142/59 138/64  Pulse: (!) 44 (!) 43 (!) 45 (!) 44  Resp: 18 18 18 18   Temp: 98.7 F (37.1 C) 98.5 F (36.9 C) 98.5 F (36.9 C) 98.4 F (36.9 C)  TempSrc: Oral Oral Oral Oral  SpO2: 100% 99% 100% 99%  Weight:      Height:        Intake/Output Summary (Last 24 hours) at 02/08/2021 0721 Last data filed at 02/07/2021 2153 Gross per 24 hour  Intake 243 ml  Output 175 ml  Net 68 ml    Filed Weights   02/04/21 1223 02/04/21 2111  Weight: 75.8 kg 74.3 kg    Telemetry    NSR with CHB  ECG    NSR with CHB - Personally Reviewed  Physical Exam  Well developed and nourished in no acute distress HENT normal Neck supple Clear Slow Regular rate and rhythm, no murmurs or gallops Abd-soft with active BS No Clubbing cyanosis edema Skin-warm  and dry A & Oriented  Grossly normal sensory and motor function    Labs    Chemistry Recent Labs  Lab 02/04/21 1228 02/05/21 0315 02/06/21 0159 02/07/21 0347 02/08/21 0236  NA 137 136 136 134* 133*  K 4.6 4.1 3.9 4.0 4.0  CL 107 108 106 109 107  CO2 19* 20* 21* 18* 19*  GLUCOSE 106* 85 96 94 95  BUN 25* 20 19 21 18   CREATININE 1.86* 1.82* 1.82* 1.74* 1.70*  CALCIUM 9.2 9.0 9.1 8.8* 8.8*  PROT 8.4*  --  6.8  --   --   ALBUMIN 3.5 2.7* 2.7*  --   --   AST 22  --  20  --   --   ALT 12  --  12  --   --   ALKPHOS 133*  --  117  --   --   BILITOT 1.0  --  0.6  --   --   GFRNONAA 38* 39* 39* 42* 43*  ANIONGAP 11 8 9 7 7       Hematology Recent Labs  Lab 02/05/21 0315 02/06/21 0159 02/07/21 0347 02/08/21 0236  WBC 8.2 6.7  --  6.1  RBC 4.12* 4.44  --  4.37  HGB 10.5* 11.2* 11.3* 11.1*  HCT 34.2* 36.8* 35.1* 36.2*  MCV 83.0 82.9  --  82.8  MCH 25.5* 25.2*  --  25.4*  MCHC 30.7 30.4  --  30.7  RDW 15.9* 16.0*  --  16.4*  PLT 179 193  --  188     Cardiac EnzymesNo results for input(s): TROPONINI in the last 168 hours. No results for input(s): TROPIPOC in the last 168 hours.   BNPNo results for input(s): BNP, PROBNP in the last 168 hours.   DDimer No results for input(s): DDIMER in the last 168 hours.   Radiology    No results found.  Cardiac Studies   See above    Assessment & Plan    Complete heart block Hypertension Renal insufficiency AAA Anemia  Pt with symptomatic complete heart block--for pacing The benefits and risks were reviewed including but not limited to death,  perforation, infection, lead dislodgement and device malfunction.  The patient understands agrees and is willing to proceed.  Will need eval of the above as outpt     For questions or updates, please contact [place] Please consult [email or web address] for contact info under Cardiology/STEMI.      Signed, Virl Axe, MD  02/08/2021, 7:21 AM

## 2021-02-08 NOTE — Progress Notes (Signed)
PROGRESS NOTE  Fernando Dean:[id number] DOB: October 28, 1950 DOA: 02/04/2021 PCP: [name], FNP  Brief History   Fernando Dean  is a 70 y.o. 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  Thoracic aorta described as normal in caliber and without dissection.  Pulmonary artery is enlarged suggestive of pulmonary hypertension and he also has emphysematous changes with bronchial thickening, cardiomegaly, and coronary artery calcifications. -Creatinine is 1.86, no recent baseline -WBCs 11.2 , hemoglobin 11.7, platelets 228 -TSH 1.0, potassium 4.6, magnesium pending  The patient was transferred from [facility] ED to [facility] for evaluation of his complete heart block by EP Cardiology.  GI has been consulted. They have recommended pursuing issue of positive FOBTas outpatient as anticoagulation is not an acute issue during this admission.  Pt is to go for PPM today.   Consultants  EP Cardiology  Procedures  None  Antibiotics   Anti-infectives (From admission, onward)    Start     Dose/Rate Route Frequency Ordered Stop   02/08/21 1245  ceFAZolin (ANCEF) IVPB 1 g/50 mL premix        1 g 100 mL/hr over 30 Minutes  Intravenous Every 6 hours 02/08/21 0945 02/09/21 0659   02/08/21 0730  ceFAZolin (ANCEF) IVPB 2g/100 mL premix        2 g 200 mL/hr over 30 Minutes Intravenous On call 02/07/21 1800 02/08/21 1016   02/07/21 1900  gentamicin (GARAMYCIN) 80 mg in sodium chloride 0.9 % 500 mL irrigation        80 mg Irrigation On call 02/07/21 1800 02/08/21 0913      Subjective  The patient is resting comfortably. No new complaints.  Objective   Vitals:  Vitals:   02/08/21 0943 02/08/21 1200  BP: (!) 155/81 (!) 144/82  Pulse: 66 71  Resp:  15  Temp:  97.7 F (36.5 C)  SpO2:  100%    Exam:  Constitutional:  The patient is awake, alert, and oriented x 3. No acute distress. Respiratory:  No increased work of breathing. No wheezes, rales, or rhonchi No tactile fremitus Cardiovascular:  Regular rate and rhythm - Slow No murmurs, ectopy, or gallups. No lateral PMI. No thrills. Abdomen:  Abdomen is soft, non-tender, non-distended No hernias, masses, or organomegaly Normoactive bowel sounds.  Musculoskeletal:  No cyanosis, clubbing, or edema Cachectic Skin:  No rashes, lesions, ulcers palpation of skin: no induration or nodules Neurologic:  CN 2-12 intact Sensation all 4 extremities intact Psychiatric:  Mental status Mood, affect appropriate Orientation to person, place, time  judgment and insight appear intact I have personally reviewed the following:   Today's Data  Vitals  Lab Data  CBC BMP  Micro Data  Imaging  CTA chest/abdomen/pelvis   Cardiology Data  EKG Echocardiogram  Scheduled Meds:  amLODipine  10 mg Oral Daily   folic acid  1 mg Oral Daily   multivitamin with minerals  1 tablet Oral Daily   nicotine  21 mg Transdermal Daily   sodium chloride flush  3 mL Intravenous Q12H   sodium chloride flush  3 mL Intravenous Q12H   thiamine  100 mg Oral Daily   Or   thiamine  100 mg Intravenous Daily   umeclidinium-vilanterol  1 puff Inhalation Daily    Continuous Infusions:  sodium chloride      ceFAZolin (ANCEF) IV 1 g (02/08/21 1332)    Principal Problem:   Bradycardia/Complete Heart Block Active Problems:   Heme positive stool   Essential hypertension   Heart block   Alcohol abuse   Tobacco abuse   CKD (chronic kidney disease), stage IIIB (HCC)   LOS: 4 days  1)Bradycardia/Complete Heart Block -General cardiology consult appreciated HR  high 30s and low 40s --EKG shows complete heart block as well as RBBB and block and LAFB TSH 1.0, potassium 4.6, magnesium 1.8 -Troponin pending -patient needs EP consultation at [facility] -Dr. Chalmers Cater has evaluated the patient for EP Cardiology. I appreciate his assistance. -Avoid negative chronotropic agents -Pacer pads in place -Patient denies syncope, admits to some dizziness, no chest pains ,has some dyspnea on exertion but no dyspnea at rest -Echo demonstrates EF 60-65%. Mild LVH. RV systolic function is normal. There is mildly elevated pulmonary artery systolic pressure. Left atrium is severely dilated. Right atrium is moderately dilated. There is moderate aortic valve regurgitation. -Plan is for PPM later today.   2) alcohol abuse----patient drinks at least 1 bottle of vodka each day along with beer -High risk for DTs -Scheduled Valium as prescribed -Lorazepam per CIWA protocol along with multivitamin thiamine and folic acid   3) tobacco abuse--smoking cessation advised nicotine patch as prescribed   4) CKD stage -3B -No recent creatinine or renal labs available -Suspect patient probably have stage IIIb CKD at baseline given underlying longstanding hypertension - renally adjust medications, avoid nephrotoxic agents / dehydration  / hypotension -Severely atrophic left kidney. -Cortical scarring of the right kidney. -Creatinine 1.70 today.   5)HTN--stage II, okay to start amlodipine 10 mg daily  may use IV Hydralazine 10 mg  Every 4 hours Prn for systolic blood pressure  over 160 mmhg -Obviously avoid negative chronotropic agents avoid ACEI/ARB/ARNI due to renal concerns   6)Heme +ve Stool--with mild anemia-  hemoglobin 11.7, currently evaluated by Dr. Jenetta Downer from GI service plans for GI evaluation once bradycardia has been addressed. GI has been consulted. They have recommended pursuing issue of positive FOBTas outpatient as anticoagulation is not an acute issue during this admission.   7) COPD with pulmonary hypertension suspect some degree of cor pulmonale-- -- Bronchodilators as advised hold off on steroid   8)AAA-CT abdomen and pelvis showed  infrarenal abdominal aortic aneurysm with significant mural thrombus measuring 5.8 x 5.4 cm, no evidence of rupture or dissection-- --EDP spoke with on-call vascular surgeon who advised outpatient vascular surgery follow-up    Disposition/Need for in-Hospital Stay- patient unable to be discharged at this time due to persistent severe bradycardia requiring further EP evaluation,    Status is: Inpatient   Remains inpatient appropriate because: See disposition above   Dispo: The patient is from: Home              Anticipated d/c is  to: Home              Anticipated d/c date is: 2 days              Patient currently is not medically stable to d/c.  Karie Kirks, DO [facility] February 08, 2021  5:45 PM

## 2021-02-08 NOTE — Discharge Instructions (Addendum)
You were hospitalized 02/04/21 - 02/09/21 and underwent pacemaker implant.  Please follow instructions below.        Supplemental Discharge Instructions for  Pacemaker/Defibrillator Patients   Activity No heavy lifting or vigorous activity with your left/right arm for 6 to 8 weeks.  Do not raise your left/right arm above your head for one week.  Gradually raise your affected arm as drawn below.             02/13/21                   02/14/21                  02/15/21                02/16/21 __  NO DRIVING for   1 week  ; you may begin driving on   35/46/56  .  WOUND CARE Keep the wound area clean and dry.  Do not get this area wet , no showers for 24 hours; you may shower on  02/09/21 evening   . Dr. Caryl Comes used DERMABOND to close your wound.  DO NOT peel this off.  Do not rub/scrub the area, pat dry. No bandage is needed on the site.  DO  NOT apply any creams, oils, or ointments to the wound area. If you notice any drainage or discharge from the wound, any swelling or bruising at the site, or you develop a fever > 101? F after you are discharged home, call the office at once.  Special Instructions You are still able to use cellular telephones; use the ear opposite the side where you have your pacemaker/defibrillator.  Avoid carrying your cellular phone near your device. When traveling through airports, show security personnel your identification card to avoid being screened in the metal detectors.  Ask the security personnel to use the hand wand. Avoid arc welding equipment, MRI testing (magnetic resonance imaging), TENS units (transcutaneous nerve stimulators).  Call the office for questions about other devices. Avoid electrical appliances that are in poor condition or are not properly grounded. Microwave ovens are safe to be near or to operate.

## 2021-02-09 ENCOUNTER — Ambulatory Visit (HOSPITAL_COMMUNITY): Admission: RE | Admit: 2021-02-09 | Payer: Medicare Other | Source: Ambulatory Visit

## 2021-02-09 ENCOUNTER — Inpatient Hospital Stay (HOSPITAL_COMMUNITY): Payer: Medicare Other

## 2021-02-09 DIAGNOSIS — I10 Essential (primary) hypertension: Secondary | ICD-10-CM

## 2021-02-09 DIAGNOSIS — F1010 Alcohol abuse, uncomplicated: Secondary | ICD-10-CM

## 2021-02-09 MED ORDER — CARVEDILOL 6.25 MG PO TABS
6.2500 mg | ORAL_TABLET | Freq: Two times a day (BID) | ORAL | Status: DC
  Administered 2021-02-09: 13:00:00 6.25 mg via ORAL
  Filled 2021-02-09: qty 1

## 2021-02-09 MED ORDER — AMLODIPINE BESYLATE 10 MG PO TABS: 10.0000 mg | ORAL_TABLET | Freq: Every day | ORAL | 3 refills | Status: DC

## 2021-02-09 MED ORDER — CARVEDILOL 6.25 MG PO TABS: 6.2500 mg | ORAL_TABLET | Freq: Two times a day (BID) | ORAL | 3 refills | Status: DC

## 2021-02-09 MED ORDER — METOPROLOL SUCCINATE ER 50 MG PO TB24: 50.0000 mg | ORAL_TABLET | Freq: Every day | ORAL | Status: DC

## 2021-02-09 MED FILL — Lidocaine HCl Local Preservative Free (PF) Inj 1%: INTRAMUSCULAR | Qty: 30 | Status: AC

## 2021-02-09 NOTE — Discharge Summary (Signed)
Physician Discharge Summary   Fernando Dean CZY:[id number] DOB: 06-17-1950 DOA: 02/04/2021  PCP: Fernando Nose, FNP  Admit date: 02/04/2021 Discharge date: 02/09/2021   Admitted From: home Disposition:  home Discharging physician: Dwyane Dee, MD  Recommendations for Outpatient Follow-up:  Follow-up with pacemaker device clinic  Home Health:  Equipment/Devices:   Discharge Condition: stable CODE STATUS: Full Diet recommendation:  Diet Orders (From admission, onward)     Start     Ordered   02/09/21 0000  Diet - low sodium heart healthy        02/09/21 1034   02/08/21 0947  Diet Heart Room service appropriate? Yes; Fluid consistency: Thin  Diet effective now       Question Answer Comment  Room service appropriate? 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  Patient will follow up outpatient with device clinic   Alcohol abuse -Counseled on cessation or cutting back at least   Tobacco abuse--smoking cessation advised nicotine patch as prescribed   CKD stage -3B -No recent creatinine or renal labs available -Suspect patient probably have stage IIIb CKD at baseline given underlying longstanding hypertension - renally adjust medications, avoid nephrotoxic agents / dehydration  / hypotension -Severely atrophic left kidney. -Cortical scarring of the right kidney.   HTN - okay to start amlodipine 10 mg daily -Coreg added per EP - Follow-up blood pressure response   Heme +ve Stool--with mild anemia-  hemoglobin 11.7, -Outpatient follow-up with GI - Hemoglobin stable   COPD with pulmonary hypertension Bronchodilators as advised hold off on steroid   AAA-CT abdomen and pelvis showed  infrarenal abdominal aortic aneurysm with  significant mural thrombus measuring 5.8 x 5.4 cm, no evidence of rupture or dissection-- EDP spoke with on-call vascular surgeon who advised outpatient vascular surgery follow-up  - outpatient follow up with vascular surgery      The patient's chronic medical conditions were treated accordingly per the patient's home medication regimen except as noted.  On day of discharge, patient was felt deemed stable for discharge. Patient/family member advised to call PCP or come back to ER if needed.   Principal Diagnosis: Bradycardia  Discharge Diagnoses: Principal Problem:   Bradycardia/Complete Heart Block Active Problems:   Heme positive stool   Essential hypertension   Heart block   Alcohol abuse   Tobacco abuse   CKD (chronic kidney disease), stage IIIB (HCC)   Discharge Instructions     Diet - low sodium heart healthy   Complete by: As directed    Increase activity slowly   Complete by: As directed       Allergies as of 02/09/2021       Reactions   Advil [ibuprofen] Shortness Of Breath, Palpitations   Aleve [naproxen] Shortness Of Breath   Nsaids Shortness Of Breath        Medication List     TAKE these medications    amLODipine 10 MG tablet Commonly known as: NORVASC Take 1 tablet (10 mg total) by mouth daily. Start taking on: February 10, 2021   carvedilol 6.25 MG tablet Commonly known as: COREG Take 1 tablet (6.25 mg total) by mouth 2 (two) times daily with a meal.   multivitamin with minerals tablet Take 1  tablet by mouth daily.        Follow-up Information     Fernando Nose, FNP. Schedule an appointment as soon as possible for a visit in 1 week(s).   Specialty: Family Medicine Contact information: [place] [facility] [address] [phone]         Fernando Dean, Fernando Quince, MD. Schedule an appointment as soon as possible for a visit in 2 week(s).   Specialty: Gastroenterology Contact information: [address] [address] [phone]         Fernando Heck, MD. Schedule an appointment as soon as possible for a visit in 2 week(s).   Specialty: Vascular Surgery Contact information: [place] [address] [phone]                Allergies  Allergen Reactions   Advil [Ibuprofen] Shortness Of Breath and Palpitations   Aleve [Naproxen] Shortness Of Breath   Nsaids Shortness Of Breath    Consultations: EP  Discharge Exam: BP (!) 166/87 (BP Location: Right Arm)    Pulse 70    Temp 98.1 F (36.7 C) (Oral)    Resp 17    Ht 5\' 11"  (1.803 m)    Wt 74.3 kg    SpO2 98%    BMI 22.85 kg/m  Physical Exam Constitutional:      General: He is not in acute distress.    Appearance: Normal appearance.  HENT:     Head: Normocephalic and atraumatic.     Mouth/Throat:     Mouth: Mucous membranes are moist.  Eyes:     Extraocular Movements: Extraocular movements intact.  Cardiovascular:     Rate and Rhythm: Normal rate and regular rhythm.     Heart sounds: Normal heart sounds.  Pulmonary:     Effort: Pulmonary effort is normal. No respiratory distress.     Breath sounds: Normal breath sounds. No wheezing.  Abdominal:     General: Bowel sounds are normal. There is no distension.     Palpations: Abdomen is soft.     Tenderness: There is no abdominal tenderness.  Musculoskeletal:        General: Normal range of motion.     Cervical back: Normal range of motion and neck supple.  Skin:    General: Skin is warm and dry.  Neurological:     General: No focal deficit present.     Mental Status: He is alert.  Psychiatric:        Mood and Affect: Mood normal.        Behavior: Behavior normal.     The results of significant diagnostics from this hospitalization (including imaging, microbiology, ancillary and laboratory) are listed below for reference.   Microbiology: Recent Results (from the past 240 hour(s))  Resp Panel by RT-PCR (Flu A&B, Covid)  Nasopharyngeal Swab     Status: None   Collection Time: 02/04/21  3:25 PM   Specimen: Nasopharyngeal Swab; Nasopharyngeal(NP) swabs in vial transport medium  Result Value Ref Range Status   SARS Coronavirus 2 by RT PCR NEGATIVE NEGATIVE Final    Comment: (NOTE) SARS-CoV-2 target nucleic acids are NOT DETECTED.  The SARS-CoV-2 RNA is generally detectable in upper respiratory specimens during the acute phase of infection. The lowest concentration of SARS-CoV-2 viral copies this assay can detect is 138 copies/mL. A negative result does not preclude SARS-Cov-2 infection and should not be used as the sole basis for treatment or other  patient management decisions. A negative result may occur with  improper specimen collection/handling, submission of specimen other than nasopharyngeal swab, presence of viral mutation(s) within the areas targeted by this assay, and inadequate number of viral copies(<138 copies/mL). A negative result must be combined with clinical observations, patient history, and epidemiological information. The expected result is Negative.  Fact Sheet for Patients:  EntrepreneurPulse.com.au  Fact Sheet for Healthcare Providers:  IncredibleEmployment.be  This test is no t yet approved or cleared by the [place] FDA and  has been authorized for detection and/or diagnosis of SARS-CoV-2 by FDA under an Emergency Use Authorization (EUA). This EUA will remain  in effect (meaning this test can be used) for the duration of the COVID-19 declaration under Section 564(b)(1) of the Act, 21 U.S.C.section 360bbb-3(b)(1), unless the authorization is terminated  or revoked sooner.       Influenza A by PCR NEGATIVE NEGATIVE Final   Influenza B by PCR NEGATIVE NEGATIVE Final    Comment: (NOTE) The Xpert Xpress SARS-CoV-2/FLU/RSV plus assay is intended as an aid in the diagnosis of influenza from Nasopharyngeal swab specimens and should not be  used as a sole basis for treatment. Nasal washings and aspirates are unacceptable for Xpert Xpress SARS-CoV-2/FLU/RSV testing.  Fact Sheet for Patients: EntrepreneurPulse.com.au  Fact Sheet for Healthcare Providers: IncredibleEmployment.be  This test is not yet approved or cleared by the [place] FDA and has been authorized for detection and/or diagnosis of SARS-CoV-2 by FDA under an Emergency Use Authorization (EUA). This EUA will remain in effect (meaning this test can be used) for the duration of the COVID-19 declaration under Section 564(b)(1) of the Act, 21 U.S.C. section 360bbb-3(b)(1), unless the authorization is terminated or revoked.  Performed at [facility], [address], [place], [place] [address]   Surgical PCR screen     Status: None   Collection Time: 02/07/21  6:36 PM   Specimen: Nasal Mucosa; Nasal Swab  Result Value Ref Range Status   MRSA, PCR NEGATIVE NEGATIVE Final   Staphylococcus aureus NEGATIVE NEGATIVE Final    Comment: (NOTE) The Xpert SA Assay (FDA approved for NASAL specimens in patients 61 years of age and older), is one component of a comprehensive surveillance program. It is not intended to diagnose infection nor to guide or monitor treatment. Performed at [facility], [place] [address] [place], [place] [address]      Labs: BNP (last 3 results) No results for input(s): BNP in the last 8760 hours. Basic Metabolic Panel: Recent Labs  Lab 02/04/21 1228 02/05/21 0315 02/06/21 0159 02/07/21 0347 02/08/21 0236  NA 137 136 136 134* 133*  K 4.6 4.1 3.9 4.0 4.0  CL 107 108 106 109 107  CO2 19* 20* 21* 18* 19*  GLUCOSE 106* 85 96 94 95  BUN 25* 20 19 21 18   CREATININE 1.86* 1.82* 1.82* 1.74* 1.70*  CALCIUM 9.2 9.0 9.1 8.8* 8.8*  MG 1.8  --   --   --   --   PHOS  --  3.1  --   --   --    Liver Function Tests: Recent Labs  Lab 02/04/21 1228 02/05/21 0315 02/06/21 0159  AST 22  --  20   ALT 12  --  12  ALKPHOS 133*  --  117  BILITOT 1.0  --  0.6  PROT 8.4*  --  6.8  ALBUMIN 3.5 2.7* 2.7*   No results for input(s): LIPASE, AMYLASE in the last 168  hours. No results for input(s): AMMONIA in the last 168 hours. CBC: Recent Labs  Lab 02/04/21 1228 02/05/21 0315 02/06/21 0159 02/07/21 0347 02/08/21 0236  WBC 11.2* 8.2 6.7  --  6.1  NEUTROABS 6.2  --  2.8  --  2.5  HGB 11.7* 10.5* 11.2* 11.3* 11.1*  HCT 38.4* 34.2* 36.8* 35.1* 36.2*  MCV 87.1 83.0 82.9  --  82.8  PLT 228 179 193  --  188   Cardiac Enzymes: No results for input(s): CKTOTAL, CKMB, CKMBINDEX, TROPONINI in the last 168 hours. BNP: Invalid input(s): POCBNP CBG: No results for input(s): GLUCAP in the last 168 hours. D-Dimer No results for input(s): DDIMER in the last 72 hours. Hgb A1c No results for input(s): HGBA1C in the last 72 hours. Lipid Profile No results for input(s): CHOL, HDL, LDLCALC, TRIG, CHOLHDL, LDLDIRECT in the last 72 hours. Thyroid function studies No results for input(s): TSH, T4TOTAL, T3FREE, THYROIDAB in the last 72 hours.  Invalid input(s): FREET3 Anemia work up No results for input(s): VITAMINB12, FOLATE, FERRITIN, TIBC, IRON, RETICCTPCT in the last 72 hours. Urinalysis No results found for: COLORURINE, APPEARANCEUR, [place], [place], GLUCOSEU, [place], [place], [place], PROTEINUR, UROBILINOGEN, NITRITE, LEUKOCYTESUR Sepsis Labs Invalid input(s): PROCALCITONIN,  WBC,  LACTICIDVEN Microbiology Recent Results (from the past 240 hour(s))  Resp Panel by RT-PCR (Flu A&B, Covid) Nasopharyngeal Swab     Status: None   Collection Time: 02/04/21  3:25 PM   Specimen: Nasopharyngeal Swab; Nasopharyngeal(NP) swabs in vial transport medium  Result Value Ref Range Status   SARS Coronavirus 2 by RT PCR NEGATIVE NEGATIVE Final    Comment: (NOTE) SARS-CoV-2 target nucleic acids are NOT DETECTED.  The SARS-CoV-2 RNA is generally detectable in upper respiratory specimens during  the acute phase of infection. The lowest concentration of SARS-CoV-2 viral copies this assay can detect is 138 copies/mL. A negative result does not preclude SARS-Cov-2 infection and should not be used as the sole basis for treatment or other patient management decisions. A negative result may occur with  improper specimen collection/handling, submission of specimen other than nasopharyngeal swab, presence of viral mutation(s) within the areas targeted by this assay, and inadequate number of viral copies(<138 copies/mL). A negative result must be combined with clinical observations, patient history, and epidemiological information. The expected result is Negative.  Fact Sheet for Patients:  EntrepreneurPulse.com.au  Fact Sheet for Healthcare Providers:  IncredibleEmployment.be  This test is no t yet approved or cleared by the [place] FDA and  has been authorized for detection and/or diagnosis of SARS-CoV-2 by FDA under an Emergency Use Authorization (EUA). This EUA will remain  in effect (meaning this test can be used) for the duration of the COVID-19 declaration under Section 564(b)(1) of the Act, 21 U.S.C.section 360bbb-3(b)(1), unless the authorization is terminated  or revoked sooner.       Influenza A by PCR NEGATIVE NEGATIVE Final   Influenza B by PCR NEGATIVE NEGATIVE Final    Comment: (NOTE) The Xpert Xpress SARS-CoV-2/FLU/RSV plus assay is intended as an aid in the diagnosis of influenza from Nasopharyngeal swab specimens and should not be used as a sole basis for treatment. Nasal washings and aspirates are unacceptable for Xpert Xpress SARS-CoV-2/FLU/RSV testing.  Fact Sheet for Patients: EntrepreneurPulse.com.au  Fact Sheet for Healthcare Providers: IncredibleEmployment.be  This test is not yet approved or cleared by the [place] FDA and has been authorized for detection and/or  diagnosis of SARS-CoV-2 by FDA under an Emergency Use Authorization (EUA). This EUA  will remain in effect (meaning this test can be used) for the duration of the COVID-19 declaration under Section 564(b)(1) of the Act, 21 U.S.C. section 360bbb-3(b)(1), unless the authorization is terminated or revoked.  Performed at [facility] [address] [place], [place] [address]   Surgical PCR screen     Status: None   Collection Time: 02/07/21  6:36 PM   Specimen: Nasal Mucosa; Nasal Swab  Result Value Ref Range Status   MRSA, PCR NEGATIVE NEGATIVE Final   Staphylococcus aureus NEGATIVE NEGATIVE Final    Comment: (NOTE) The Xpert SA Assay (FDA approved for NASAL specimens in patients 15 years of age and older), is one component of a comprehensive surveillance program. It is not intended to diagnose infection nor to guide or monitor treatment. Performed at [facility], [place] [address] [place], [place] [address]     Procedures/Studies: DG Chest 2 View  Result Date: 02/09/2021 CLINICAL DATA:  Insertion of cardiac device EXAM: CHEST - 2 VIEW COMPARISON:  02/04/2021 FINDINGS: Left pacer in place with leads in the right atrium and right ventricle. No pneumothorax. Heart is normal size. Lungs clear. No effusions or acute bony abnormality. IMPRESSION: Left pacer placement without pneumothorax. No acute cardiopulmonary disease. Electronically Signed   By: Rolm Baptise M.D.   On: 02/09/2021 08:51   DG Chest Port 1 View  Result Date: 02/04/2021 CLINICAL DATA:  Weak EXAM: PORTABLE CHEST 1 VIEW COMPARISON:  02/14/2004 FINDINGS: The heart size and mediastinal contours are within normal limits. Both lungs are clear. The visualized skeletal structures are unremarkable. IMPRESSION: No acute abnormality of the lungs in AP portable projection. Electronically Signed   By: Delanna Ahmadi M.D.   On: 02/04/2021 13:18   ECHOCARDIOGRAM COMPLETE  Result Date: 02/05/2021    ECHOCARDIOGRAM REPORT   Patient  Name:   Fernando Dean Date of Exam: 02/05/2021 Medical Rec #:  [id number]        Height:       71.0 in Accession #:    [id number]       Weight:       163.8 lb Date of Birth:  Jan 03, 1951        BSA:          1.937 m Patient Age:    26 years         BP:           162/57 mmHg Patient Gender: M                HR:           37 bpm. Exam Location:  Inpatient Procedure: 2D Echo, 3D Echo, Cardiac Doppler and Color Doppler Indications:    Dyspnea  History:        Patient has no prior history of Echocardiogram examinations.                 Risk Factors:Hypertension.  Sonographer:    Bernadene Person RDCS Referring Phys: [place]  1. Left ventricular ejection fraction, by estimation, is 60 to 65%. The left ventricle has normal function. The left ventricle has no regional wall motion abnormalities. There is mild left ventricular hypertrophy. Left ventricular diastolic parameters are indeterminate.  2. Right ventricular systolic function is normal. The right ventricular size is not well visualized. There is mildly elevated pulmonary artery systolic pressure.  3. Left atrial size was severely dilated.  4. Right atrial size was moderately dilated.  5. The mitral  valve is grossly normal. Trivial mitral valve regurgitation.  6. AI jet against the anterior mitral valve leaflet. Jet eccentric P1/2T may underestimate. The aortic valve is calcified. Aortic valve regurgitation is moderate.  7. The inferior vena cava is normal in size with greater than 50% respiratory variability, suggesting right atrial pressure of 3 mmHg. FINDINGS  Left Ventricle: Left ventricular ejection fraction, by estimation, is 60 to 65%. The left ventricle has normal function. The left ventricle has no regional wall motion abnormalities. The left ventricular internal cavity size was normal in size. There is  mild left ventricular hypertrophy. Left ventricular diastolic parameters are indeterminate. Right Ventricle: The right ventricular  size is not well visualized. No increase in right ventricular wall thickness. Right ventricular systolic function is normal. There is mildly elevated pulmonary artery systolic pressure. The tricuspid regurgitant velocity is 2.93 m/s, and with an assumed right atrial pressure of 3 mmHg, the estimated right ventricular systolic pressure is 67.1 mmHg. Left Atrium: Left atrial size was severely dilated. Right Atrium: Right atrial size was moderately dilated. Pericardium: There is no evidence of pericardial effusion. Mitral Valve: The mitral valve is grossly normal. Trivial mitral valve regurgitation. Tricuspid Valve: The tricuspid valve is grossly normal. Tricuspid valve regurgitation is mild. Aortic Valve: AI jet against the anterior mitral valve leaflet. Jet eccentric P1/2T may underestimate. The aortic valve is calcified. Aortic valve regurgitation is moderate. Aortic regurgitation PHT measures 893 msec. Aortic valve mean gradient measures 7.0 mmHg. Aortic valve peak gradient measures 16.8 mmHg. Aortic valve area, by VTI measures 3.20 cm. Pulmonic Valve: The pulmonic valve was not well visualized. Pulmonic valve regurgitation is trivial. Aorta: The aortic root and ascending aorta are structurally normal, with no evidence of dilitation. Venous: The inferior vena cava is normal in size with greater than 50% respiratory variability, suggesting right atrial pressure of 3 mmHg. IAS/Shunts: No atrial level shunt detected by color flow Doppler.  LEFT VENTRICLE PLAX 2D LVIDd:         5.80 cm LVIDs:         3.80 cm LV PW:         1.30 cm LV IVS:        1.00 cm LVOT diam:     2.10 cm      3D Volume EF: LV SV:         130          3D EF:        58 % LV SV Index:   67           LV EDV:       212 ml LVOT Area:     3.46 cm     LV ESV:       89 ml                             LV SV:        123 ml  LV Volumes (MOD) LV vol d, MOD A2C: 174.0 ml LV vol d, MOD A4C: 205.0 ml LV vol s, MOD A2C: 76.5 ml LV vol s, MOD A4C: 85.7 ml LV SV  MOD A2C:     97.5 ml LV SV MOD A4C:     205.0 ml LV SV MOD BP:      107.7 ml RIGHT VENTRICLE TAPSE (M-mode): 1.8 cm LEFT ATRIUM              Index  RIGHT ATRIUM           Index LA diam:        4.30 cm  2.22 cm/m   RA Area:     19.00 cm LA Vol (A2C):   74.4 ml  38.41 ml/m  RA Volume:   58.00 ml  29.94 ml/m LA Vol (A4C):   118.0 ml 60.92 ml/m LA Biplane Vol: 97.7 ml  50.44 ml/m  AORTIC VALVE AV Area (Vmax):    2.99 cm AV Area (Vmean):   2.91 cm AV Area (VTI):     3.20 cm AV Vmax:           205.00 cm/s AV Vmean:          115.000 cm/s AV VTI:            0.405 m AV Peak Grad:      16.8 mmHg AV Mean Grad:      7.0 mmHg LVOT Vmax:         177.00 cm/s LVOT Vmean:        96.600 cm/s LVOT VTI:          0.374 m LVOT/AV VTI ratio: 0.92 AI PHT:            893 msec AR Vena Contracta: 0.40 cm  AORTA Ao Root diam: 3.50 cm Ao Asc diam:  3.80 cm TRICUSPID VALVE TR Peak grad:   34.3 mmHg TR Vmax:        293.00 cm/s  SHUNTS Systemic VTI:  0.37 m Systemic Diam: 2.10 cm Phineas Inches Electronically signed by Phineas Inches Signature Date/Time: 02/05/2021/12:35:37 PM    Final    CT Angio Chest/Abd/Pel for Dissection W and/or Wo Contrast  Result Date: 02/04/2021 CLINICAL DATA:  Aortic aneurysm EXAM: CT ANGIOGRAPHY CHEST, ABDOMEN AND PELVIS TECHNIQUE: Non-contrast CT of the chest was initially obtained. Multidetector CT imaging through the chest, abdomen and pelvis was performed using the standard protocol during bolus administration of intravenous contrast. Multiplanar reconstructed images and MIPs were obtained and reviewed to evaluate the vascular anatomy. CONTRAST:  58mL OMNIPAQUE IOHEXOL 350 MG/ML SOLN COMPARISON:  07/21/2011 FINDINGS: CTA CHEST FINDINGS Cardiovascular: Preferential opacification of the thoracic aorta. Normal contour and caliber of the thoracic aorta without evidence of aneurysm, dissection, or other acute aortic pathology. Mild cardiomegaly. Left and right coronary artery calcifications. No pericardial  effusion. Enlargement of the main pulmonary artery measuring up to 4.2 cm. Moderate mixed aortic atherosclerosis. Mediastinum/Nodes: No enlarged mediastinal, hilar, or axillary lymph nodes. Thyroid gland, trachea, and esophagus demonstrate no significant findings. Lungs/Pleura: Moderate to severe centrilobular emphysema. Diffuse bilateral bronchial wall thickening. No pleural effusion or pneumothorax. Musculoskeletal: No chest wall abnormality. No acute or significant osseous findings. Review of the MIP images confirms the above findings. 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 No CT evidence of acute rupture or other acute aortic pathology. Standard branching pattern of the abdominal aorta with solitary bilateral renal arteries. The branch vessel origins are patent and opacified. Review of the MIP images confirms the above findings. NON-VASCULAR Hepatobiliary: No solid liver abnormality is seen. No gallstones, gallbladder wall thickening, or biliary dilatation. Pancreas: Unremarkable. No pancreatic ductal dilatation or surrounding inflammatory changes. Spleen: Normal in size without significant abnormality. Adrenals/Urinary Tract: Adrenal glands are unremarkable. Severely atrophic left kidney with diminished nephrogram. Multifocal cortical scarring of the  right kidney. Multiple small right renal cysts. No hydronephrosis. Bladder is unremarkable. Stomach/Bowel: Stomach is within normal limits. Appendix appears normal. No evidence of bowel wall thickening, distention, or inflammatory changes. Colonic diverticula. Lymphatic: No enlarged abdominal or pelvic lymph nodes. Reproductive: No mass or other significant abnormality. Other: No abdominal wall hernia or abnormality. No abdominopelvic ascites. Musculoskeletal: No acute or significant osseous  findings. Review of the MIP images confirms the above findings. IMPRESSION: 1. 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 No CT evidence of acute rupture or other acute aortic pathology. Recommend referral to a vascular specialist. This recommendation follows ACR consensus guidelines: White Paper of the ACR Incidental Findings Committee II on Vascular Findings. J Am Coll Radiol 2013; 10:789-794. 2. Normal contour and caliber of the thoracic aorta. No evidence of thoracic aortic aneurysm, dissection, or other acute aortic pathology. 3. Enlargement of the main pulmonary artery, as can be seen in pulmonary hypertension. 4. Emphysema and diffuse bilateral bronchial wall thickening. 5. Cardiomegaly and coronary artery disease. Aortic Atherosclerosis (ICD10-I70.0) and Emphysema (ICD10-J43.9). Electronically Signed   By: Delanna Ahmadi M.D.   On: 02/04/2021 14:29     Time coordinating discharge: Over 30 minutes    Dwyane Dee, MD  [facility] 02/09/2021, 4:41 PM

## 2021-02-09 NOTE — Plan of Care (Signed)

## 2021-02-09 NOTE — Progress Notes (Addendum)
Progress Note  Patient Name: Fernando Dean Date of Encounter: 02/09/2021  Primary Cardiologist: None   Patient Profile     70 y.o. male admitted with symptomatic CHB; also found to have aneurysm   Subjective   No chest pain or shortness of breath   Inpatient Medications    Scheduled Meds:  amLODipine  10 mg Oral Daily   folic acid  1 mg Oral Daily   multivitamin with minerals  1 tablet Oral Daily   nicotine  21 mg Transdermal Daily   sodium chloride flush  3 mL Intravenous Q12H   sodium chloride flush  3 mL Intravenous Q12H   thiamine  100 mg Oral Daily   Or   thiamine  100 mg Intravenous Daily   umeclidinium-vilanterol  1 puff Inhalation Daily   Continuous Infusions:  sodium chloride     PRN Meds: sodium chloride, acetaminophen, albuterol, bisacodyl, hydrALAZINE, ondansetron **OR** ondansetron (ZOFRAN) IV, polyethylene glycol, sodium chloride flush, traZODone   Vital Signs    Vitals:   02/08/21 2111 02/09/21 0500 02/09/21 0819 02/09/21 0940  BP: (!) 166/93 (!) 166/97  (!) 157/84  Pulse:  80    Resp: 17 17    Temp: 98.4 F (36.9 C) 98.4 F (36.9 C)    TempSrc: Oral Oral    SpO2: 100% 99% 98%   Weight:      Height:        Intake/Output Summary (Last 24 hours) at 02/09/2021 1005 Last data filed at 02/09/2021 0500 Gross per 24 hour  Intake 650 ml  Output 825 ml  Net -175 ml   Filed Weights   02/04/21 1223 02/04/21 2111  Weight: 75.8 kg 74.3 kg    Telemetry    NSR with CHB  ECG    NSR with CHB - Personally Reviewed  Physical Exam  Well developed and nourished in no acute distress HENT normal Neck supple Clear Slow Regular rate and rhythm, no murmurs or gallops Abd-soft with active BS No Clubbing cyanosis edema Skin-warm and dry A & Oriented  Grossly normal sensory and motor function   Pacer site is stable, dermabond closure, no hematoma  Labs    Chemistry Recent Labs  Lab 02/04/21 1228 02/05/21 0315 02/06/21 0159  02/07/21 0347 02/08/21 0236  NA 137 136 136 134* 133*  K 4.6 4.1 3.9 4.0 4.0  CL 107 108 106 109 107  CO2 19* 20* 21* 18* 19*  GLUCOSE 106* 85 96 94 95  BUN 25* 20 19 21 18   CREATININE 1.86* 1.82* 1.82* 1.74* 1.70*  CALCIUM 9.2 9.0 9.1 8.8* 8.8*  PROT 8.4*  --  6.8  --   --   ALBUMIN 3.5 2.7* 2.7*  --   --   AST 22  --  20  --   --   ALT 12  --  12  --   --   ALKPHOS 133*  --  117  --   --   BILITOT 1.0  --  0.6  --   --   GFRNONAA 38* 39* 39* 42* 43*  ANIONGAP 11 8 9 7 7      Hematology Recent Labs  Lab 02/05/21 0315 02/06/21 0159 02/07/21 0347 02/08/21 0236  WBC 8.2 6.7  --  6.1  RBC 4.12* 4.44  --  4.37  HGB 10.5* 11.2* 11.3* 11.1*  HCT 34.2* 36.8* 35.1* 36.2*  MCV 83.0 82.9  --  82.8  MCH 25.5* 25.2*  --  25.4*  MCHC 30.7 30.4  --  30.7  RDW 15.9* 16.0*  --  16.4*  PLT 179 193  --  188    Cardiac EnzymesNo results for input(s): TROPONINI in the last 168 hours. No results for input(s): TROPIPOC in the last 168 hours.   BNPNo results for input(s): BNP, PROBNP in the last 168 hours.   DDimer No results for input(s): DDIMER in the last 168 hours.   Radiology    DG Chest 2 View  Result Date: 02/09/2021 CLINICAL DATA:  Insertion of cardiac device EXAM: CHEST - 2 VIEW COMPARISON:  02/04/2021 FINDINGS: Left pacer in place with leads in the right atrium and right ventricle. No pneumothorax. Heart is normal size. Lungs clear. No effusions or acute bony abnormality. IMPRESSION: Left pacer placement without pneumothorax. No acute cardiopulmonary disease. Electronically Signed   By: Rolm Baptise M.D.   On: 02/09/2021 08:51    Cardiac Studies    02/05/21: TTE IMPRESSIONS   1. Left ventricular ejection fraction, by estimation, is 60 to 65%. The  left ventricle has normal function. The left ventricle has no regional  wall motion abnormalities. There is mild left ventricular hypertrophy.  Left ventricular diastolic parameters  are indeterminate.   2. Right ventricular  systolic function is normal. The right ventricular  size is not well visualized. There is mildly elevated pulmonary artery  systolic pressure.   3. Left atrial size was severely dilated.   4. Right atrial size was moderately dilated.   5. The mitral valve is grossly normal. Trivial mitral valve  regurgitation.   6. AI jet against the anterior mitral valve leaflet. Jet eccentric P1/2T  may underestimate. The aortic valve is calcified. Aortic valve  regurgitation is moderate.   7. The inferior vena cava is normal in size with greater than 50%  respiratory variability, suggesting right atrial pressure of 3 mmHg.    Assessment & Plan    Complete heart block  Now s/p PPM implant yesterday with Dr. Caryl Comes Site is stable Wound care and activity instructions discussed with the patient Device check this AM with stable measurements CXR with stable lead position, no ptx Routine post pacer follow up is in place   Hypertension Will add coreg today, further with attending team  As per attending Renal insufficiency AAA Vascular service consult recommended, perhaps out patient coreg added for better BP, titrate as needed Anemia  EP service will sign off though remain available, please recall if needed  For questions or updates, please contact [facility] Please consult [email or web address] for contact info under Cardiology/STEMI.      Signed, Baldwin Jamaica, PA-C  02/09/2021, 10:05 AM     EP Attending  Patient seen and examined. Agree with the findings as noted above. The patient is doing well after PPM insertion. Interrogation of his PPM under my direction demonstrates normal DDD PM function and his CXR demonstrates no PTX and stable lead position. He may be discharged home with followup as arranged above. I will see him in my [place] office in 3 months. He has device clinic followup in the next week or two.  Carleene Overlie Gricel Copen,MD

## 2021-02-09 NOTE — Care Management Important Message (Signed)
Important Message  Patient Details  Name: Fernando Dean MRN: [id number] Date of Birth: September 18, 1950   Medicare Important Message Given:  Yes     Shelda Altes 02/09/2021, 10:12 AM

## 2021-02-09 NOTE — Plan of Care (Signed)
°  Problem: Education: Goal: Knowledge of General Education information will improve Description: Including pain rating scale, medication(s)/side effects and non-pharmacologic comfort measures 02/09/2021 1302 by Drucie Ip I, RN Outcome: Adequate for Discharge 02/09/2021 1302 by Drucie Ip I, RN Outcome: Adequate for Discharge   Problem: Health Behavior/Discharge Planning: Goal: Ability to manage health-related needs will improve 02/09/2021 1302 by Drucie Ip I, RN Outcome: Adequate for Discharge 02/09/2021 1302 by Drucie Ip I, RN Outcome: Adequate for Discharge   Problem: Clinical Measurements: Goal: Ability to maintain clinical measurements within normal limits will improve 02/09/2021 1302 by Drucie Ip I, RN Outcome: Adequate for Discharge 02/09/2021 1302 by Drucie Ip I, RN Outcome: Adequate for Discharge Goal: Will remain free from infection 02/09/2021 1302 by Drucie Ip I, RN Outcome: Adequate for Discharge 02/09/2021 1302 by Drucie Ip I, RN Outcome: Adequate for Discharge Goal: Diagnostic test results will improve 02/09/2021 1302 by Drucie Ip I, RN Outcome: Adequate for Discharge 02/09/2021 1302 by Drucie Ip I, RN Outcome: Adequate for Discharge Goal: Respiratory complications will improve 02/09/2021 1302 by Drucie Ip I, RN Outcome: Adequate for Discharge 02/09/2021 1302 by Drucie Ip I, RN Outcome: Adequate for Discharge Goal: Cardiovascular complication will be avoided 02/09/2021 1302 by Drucie Ip I, RN Outcome: Adequate for Discharge 02/09/2021 1302 by Drucie Ip I, RN Outcome: Adequate for Discharge   Problem: Activity: Goal: Risk for activity intolerance will decrease 02/09/2021 1302 by Drucie Ip I, RN Outcome: Adequate for Discharge 02/09/2021 1302 by Drucie Ip I, RN Outcome: Adequate for Discharge   Problem: Nutrition: Goal: Adequate  nutrition will be maintained 02/09/2021 1302 by Drucie Ip I, RN Outcome: Adequate for Discharge 02/09/2021 1302 by Drucie Ip I, RN Outcome: Adequate for Discharge   Problem: Coping: Goal: Level of anxiety will decrease 02/09/2021 1302 by Drucie Ip I, RN Outcome: Adequate for Discharge 02/09/2021 1302 by Drucie Ip I, RN Outcome: Adequate for Discharge   Problem: Elimination: Goal: Will not experience complications related to bowel motility 02/09/2021 1302 by Drucie Ip I, RN Outcome: Adequate for Discharge 02/09/2021 1302 by Drucie Ip I, RN Outcome: Adequate for Discharge Goal: Will not experience complications related to urinary retention 02/09/2021 1302 by Drucie Ip I, RN Outcome: Adequate for Discharge 02/09/2021 1302 by Drucie Ip I, RN Outcome: Adequate for Discharge   Problem: Pain Managment: Goal: General experience of comfort will improve 02/09/2021 1302 by Drucie Ip I, RN Outcome: Adequate for Discharge 02/09/2021 1302 by Drucie Ip I, RN Outcome: Adequate for Discharge   Problem: Safety: Goal: Ability to remain free from injury will improve 02/09/2021 1302 by Drucie Ip I, RN Outcome: Adequate for Discharge 02/09/2021 1302 by Drucie Ip I, RN Outcome: Adequate for Discharge   Problem: Skin Integrity: Goal: Risk for impaired skin integrity will decrease 02/09/2021 1302 by Drucie Ip I, RN Outcome: Adequate for Discharge 02/09/2021 1302 by Drucie Ip I, RN Outcome: Adequate for Discharge

## 2021-02-09 NOTE — Plan of Care (Signed)

## 2021-02-16 ENCOUNTER — Other Ambulatory Visit: Payer: Self-pay

## 2021-02-16 ENCOUNTER — Ambulatory Visit: Payer: Medicare Other | Admitting: Vascular Surgery

## 2021-02-16 ENCOUNTER — Encounter: Payer: Self-pay | Admitting: Vascular Surgery

## 2021-02-16 DIAGNOSIS — I7143 Infrarenal abdominal aortic aneurysm, without rupture: Secondary | ICD-10-CM

## 2021-02-16 DIAGNOSIS — I714 Abdominal aortic aneurysm, without rupture, unspecified: Secondary | ICD-10-CM | POA: Insufficient documentation

## 2021-02-16 NOTE — Progress Notes (Signed)
Patient name: Fernando Dean MRN: [id number] DOB: [birth year] Sex: male  REASON FOR CONSULT: Evaluate abdominal aortic aneurysm  HPI: Fernando Dean is a 70 y.o. male, with history of hypertension, CKD, tobacco abuse, and recent diagnosis of third-degree heart block that presents for evaluation of abdominal aortic aneurysm.  Patient's aneurysm was discovered on recent CT from 02/04/2021 documenting a 5.8 cm abdominal aortic aneurysm.  Patient states he has no prior knowledge of this aneurysm.  He denies any abdominal or back pain today.  States he does have some chronic intermittent back pain but this is not bothering him today.  He thinks his mom had an aneurysm.  He does smoke about a pack a day.  Recently had a pacemaker inserted on his hospital admission for bradycardia/complete heart block.  Past Medical History:  Diagnosis Date   Heme positive stool    Hypertension     Past Surgical History:  Procedure Laterality Date   HERNIA REPAIR     2012   PACEMAKER IMPLANT N/A 02/08/2021   Procedure: PACEMAKER IMPLANT;  Surgeon: Deboraha Sprang, MD;  Location: [facility];  Service: Cardiovascular;  Laterality: N/A;    Family History  Problem Relation Age of Onset   Bladder Cancer Mother    Heart disease Father     SOCIAL HISTORY: Social History   Socioeconomic History   Marital status: Single    Spouse name: Not on file   Number of children: Not on file   Years of education: Not on file   Highest education level: Not on file  Occupational History   Not on file  Tobacco Use   Smoking status: Every Day    Packs/day: 1.00    Types: Cigarettes   Smokeless tobacco: Never  Vaping Use   Vaping Use: Never used  Substance and Sexual Activity   Alcohol use: Yes    Comment: daily liquor moderatly   Drug use: Not on file   Sexual activity: Not on file  Other Topics Concern   Not on file  Social History Narrative   Not on file   Social Determinants of Health    Financial Resource Strain: Not on file  Food Insecurity: Not on file  Transportation Needs: Not on file  Physical Activity: Not on file  Stress: Not on file  Social Connections: Not on file  Intimate Partner Violence: Not on file    Allergies  Allergen Reactions   Advil [Ibuprofen] Shortness Of Breath and Palpitations   Aleve [Naproxen] Shortness Of Breath   Nsaids Shortness Of Breath    Current Outpatient Medications  Medication Sig Dispense Refill   amLODipine (NORVASC) 10 MG tablet Take 1 tablet (10 mg total) by mouth daily. 30 tablet 3   carvedilol (COREG) 6.25 MG tablet Take 1 tablet (6.25 mg total) by mouth 2 (two) times daily with a meal. 60 tablet 3   Multiple Vitamins-Minerals (MULTIVITAMIN WITH MINERALS) tablet Take 1 tablet by mouth daily.     No current facility-administered medications for this visit.    REVIEW OF SYSTEMS:  [X]  denotes positive finding, [ ]  denotes negative finding Cardiac  Comments:  Chest pain or chest pressure:    Shortness of breath upon exertion:    Short of breath when lying flat:    Irregular heart rhythm:        Vascular    Pain in calf, thigh, or hip brought on by ambulation:    Pain in feet at  night that wakes you up from your sleep:     Blood clot in your veins:    Leg swelling:         Pulmonary    Oxygen at home:    Productive cough:     Wheezing:         Neurologic    Sudden weakness in arms or legs:     Sudden numbness in arms or legs:     Sudden onset of difficulty speaking or slurred speech:    Temporary loss of vision in one eye:     Problems with dizziness:         Gastrointestinal    Blood in stool:     Vomited blood:         Genitourinary    Burning when urinating:     Blood in urine:        Psychiatric    Major depression:         Hematologic    Bleeding problems:    Problems with blood clotting too easily:        Skin    Rashes or ulcers:        Constitutional    Fever or chills:       PHYSICAL EXAM: Vitals:   02/16/21 1036  BP: (!) 149/89  Pulse: 60  Resp: 16  Temp: (!) 97.5 F (36.4 C)  TempSrc: Temporal  SpO2: (!) 3%  Weight: 163 lb (73.9 kg)  Height: 6' (1.829 m)    GENERAL: The patient is a well-nourished male, in no acute distress. The vital signs are documented above. CARDIAC: There is a regular rate and rhythm.  VASCULAR:  Palpable femoral pulses bilaterally Palpable PT pulses bilaterally PULMONARY: No respiratory distress. ABDOMEN: Soft and non-tender.  No pain with palpation of aneurysm. MUSCULOSKELETAL: There are no major deformities or cyanosis. NEUROLOGIC: No focal weakness or paresthesias are detected. SKIN: There are no ulcers or rashes noted. PSYCHIATRIC: The patient has a normal affect.  DATA:   CTA abdomen pelvis reviewed from 02/04/2021 with 5.8 cm abdominal aortic aneurysm.  This does have a reversed taper neck.  Access looks adequate.  Assessment/Plan:  70 year old male recently admitted with third-degree heart block requiring pacemaker found to have 5.8 cm abdominal aortic aneurysm.  I discussed after review of CT scan I would recommend repair with stent graft.  He does have a reverse taper but I think we can get seal with the new Gore conformable device.  I measured the neck at just about 15 mm.  His aneurysm is asymptomatic and no pain with palpation.  Discussed current guidelines are to repair these at greater than 5.5 cm.  I discussed bilateral transfemoral access in the OR under anesthesia.  Discussed risk and benefits including risk of bleeding, infection, MI, stroke, renal dysfunction, renal failure, bowel ischemia etc.  He wishes to proceed.  We will get him scheduled for next week.  All questions answered.   Marty Heck, MD [facility] Office: [phone]

## 2021-02-17 DIAGNOSIS — R195 Other fecal abnormalities: Secondary | ICD-10-CM | POA: Diagnosis not present

## 2021-02-17 DIAGNOSIS — I714 Abdominal aortic aneurysm, without rupture, unspecified: Secondary | ICD-10-CM | POA: Diagnosis not present

## 2021-02-17 DIAGNOSIS — F17200 Nicotine dependence, unspecified, uncomplicated: Secondary | ICD-10-CM | POA: Diagnosis not present

## 2021-02-17 DIAGNOSIS — Z950 Presence of cardiac pacemaker: Secondary | ICD-10-CM | POA: Diagnosis not present

## 2021-02-17 DIAGNOSIS — I10 Essential (primary) hypertension: Secondary | ICD-10-CM | POA: Diagnosis not present

## 2021-02-18 ENCOUNTER — Ambulatory Visit (INDEPENDENT_AMBULATORY_CARE_PROVIDER_SITE_OTHER): Payer: Medicare Other

## 2021-02-18 ENCOUNTER — Other Ambulatory Visit: Payer: Self-pay

## 2021-02-18 DIAGNOSIS — I459 Conduction disorder, unspecified: Secondary | ICD-10-CM

## 2021-02-18 LAB — CUP PACEART INCLINIC DEVICE CHECK
Battery Remaining Longevity: 126 mo
Battery Voltage: 3.08 V
Brady Statistic RA Percent Paced: 33 %
Brady Statistic RV Percent Paced: 97 %
Date Time Interrogation Session: 20221229154216
Implantable Lead Implant Date: 20221219
Implantable Lead Implant Date: 20221219
Implantable Lead Location: 753859
Implantable Lead Location: 753860
Implantable Pulse Generator Implant Date: 20221219
Lead Channel Impedance Value: 475 Ohm
Lead Channel Impedance Value: 575 Ohm
Lead Channel Pacing Threshold Amplitude: 0.5 V
Lead Channel Pacing Threshold Amplitude: 0.5 V
Lead Channel Pacing Threshold Pulse Width: 0.5 ms
Lead Channel Pacing Threshold Pulse Width: 0.5 ms
Lead Channel Sensing Intrinsic Amplitude: 12 mV
Lead Channel Sensing Intrinsic Amplitude: 2.8 mV
Lead Channel Setting Pacing Amplitude: 0.75 V
Lead Channel Setting Pacing Amplitude: 1.375
Lead Channel Setting Pacing Pulse Width: 0.5 ms
Lead Channel Setting Sensing Sensitivity: 2 mV
Pulse Gen Model: 2272
Pulse Gen Serial Number: 3985814

## 2021-02-18 NOTE — Progress Notes (Signed)
Device orders request sent via [employer] to [facility] for pacemaker programming for surgery on 02/26/21.

## 2021-02-18 NOTE — Progress Notes (Signed)
Wound check appointment. Demabond removed. Wound without redness or edema. Incision edges approximated, wound well healed. Normal device function. Thresholds, sensing, and impedances consistent with implant measurements. Device auto capture programmed on for extra safety margin until 3 month visit. Histogram distribution appropriate for patient and level of activity. No mode switches or high ventricular rates noted. Patient educated about wound care, arm mobility, lifting restrictions. Patient is enrolled in remote monitoring, next scheduled check 05/10/20.  ROV with Dr. Raj Janus on 05/25/21.

## 2021-02-18 NOTE — Patient Instructions (Signed)
° °  After Your Pacemaker   Monitor your pacemaker site for redness, swelling, and drainage. Call the device clinic at [phone] if you experience these symptoms or fever/chills.  Your incision was closed with Dermabond:  You may shower 1 day after your defibrillator implant and wash your incision with soap and water. Avoid lotions, ointments, or perfumes over your incision until it is well-healed.  You may use a hot tub or a pool after your wound check appointment if the incision is completely closed.  Do not lift, push or pull greater than 10 pounds with the affected arm until 6 weeks after your procedure. There are no other restrictions in arm movement after your wound check appointment.  You may drive, unless driving has been restricted by your healthcare providers.  Your Pacemaker is MRI compatible.  Remote monitoring is used to monitor your pacemaker from home. This monitoring is scheduled every 91 days by our office. It allows [place] to keep an eye on the functioning of your device to ensure it is working properly. You will routinely see your Electrophysiologist annually (more often if necessary).

## 2021-02-18 NOTE — Progress Notes (Signed)
Spoke to [place] at [facility] who will send a text page to notify device rep of upcoming surgery on 02/26/21 at 0730 with patient arrival at 0530.

## 2021-02-18 NOTE — Pre-Procedure Instructions (Signed)
Surgical Instructions    Your procedure is scheduled on Friday 02/26/21.   Report to [facility] Main Entrance "A" at 05:30 A.M., then check in with the Admitting office.  Call this number if you have problems the morning of surgery:  [phone]   If you have any questions prior to your surgery date call [phone]: Open Monday-Friday 8am-4pm    Remember:  Do not eat or drink after midnight the night before your surgery    Take these medicines the morning of surgery with A SIP OF WATER   amLODipine (NORVASC)  carvedilol (COREG)   As of today, STOP taking any Aspirin (unless otherwise instructed by your surgeon) Aleve, Naproxen, Ibuprofen, Motrin, Advil, Goody's, BC's, all herbal medications, fish oil, and all vitamins.                     Do NOT Smoke (Tobacco/Vaping) or drink Alcohol 24 hours prior to your procedure.  If you use a CPAP at night, you may bring all equipment for your overnight stay.   Contacts, glasses, piercing's, hearing aid's, dentures or partials may not be worn into surgery, please bring cases for these belongings.    For patients admitted to the hospital, discharge time will be determined by your treatment team.   Patients discharged the day of surgery will not be allowed to drive home, and someone needs to stay with them for 24 hours.  NO VISITORS WILL BE ALLOWED IN PRE-OP WHERE PATIENTS GET READY FOR SURGERY.  ONLY 1 SUPPORT PERSON MAY BE PRESENT IN THE WAITING ROOM WHILE YOU ARE IN SURGERY.  IF YOU ARE TO BE ADMITTED, ONCE YOU ARE IN YOUR ROOM YOU WILL BE ALLOWED TWO (2) VISITORS.  Minor children may have two parents present. Special consideration for safety and communication needs will be reviewed on a case by case basis.   Special instructions:   [facility]- Preparing For Surgery  Before surgery, you can play an important role. Because skin is not sterile, your skin needs to be as free of germs as possible. You can reduce the number of germs on your  skin by washing with CHG (chlorahexidine gluconate) Soap before surgery.  CHG is an antiseptic cleaner which kills germs and bonds with the skin to continue killing germs even after washing.    Oral Hygiene is also important to reduce your risk of infection.  Remember - BRUSH YOUR TEETH THE MORNING OF SURGERY WITH YOUR REGULAR TOOTHPASTE  Please do not use if you have an allergy to CHG or antibacterial soaps. If your skin becomes reddened/irritated stop using the CHG.  Do not shave (including legs and underarms) for at least 48 hours prior to first CHG shower. It is OK to shave your face.  Please follow these instructions carefully.   Shower the NIGHT BEFORE SURGERY and the MORNING OF SURGERY  If you chose to wash your hair, wash your hair first as usual with your normal shampoo.  After you shampoo, rinse your hair and body thoroughly to remove the shampoo.  Use CHG Soap as you would any other liquid soap. You can apply CHG directly to the skin and wash gently with a scrungie or a clean washcloth.   Apply the CHG Soap to your body ONLY FROM THE NECK DOWN.  Do not use on open wounds or open sores. Avoid contact with your eyes, ears, mouth and genitals (private parts). Wash Face and genitals (private parts)  with your normal soap.  Wash thoroughly, paying special attention to the area where your surgery will be performed.  Thoroughly rinse your body with warm water from the neck down.  DO NOT shower/wash with your normal soap after using and rinsing off the CHG Soap.  Pat yourself dry with a CLEAN TOWEL.  Wear CLEAN PAJAMAS to bed the night before surgery  Place CLEAN SHEETS on your bed the night before your surgery  DO NOT SLEEP WITH PETS.   Day of Surgery: Shower with CHG soap. Do not wear jewelry, make up, nail polish, gel polish, artificial nails, or any other type of covering on natural nails including finger and toenails. If patients have artificial nails, gel coating, etc.  that need to be removed by a nail salon please have this removed prior to surgery. Surgery may need to be canceled/delayed if the surgeon/ anesthesia feels like the patient is unable to be adequately monitored. Do not wear lotions, powders, perfumes/colognes, or deodorant. Do not shave 48 hours prior to surgery.  Men may shave face and neck. Do not bring valuables to the hospital. [facility] is not responsible for any belongings or valuables. Wear Clean/Comfortable clothing the morning of surgery Remember to brush your teeth WITH YOUR REGULAR TOOTHPASTE.   Please read over the following fact sheets that you were given.   3 days prior to your procedure or After your COVID test   You are not required to quarantine however you are required to wear a well-fitting mask when you are out and around people not in your household. If your mask becomes wet or soiled, replace with a new one.   Wash your hands often with soap and water for 20 seconds or clean your hands with an alcohol-based hand sanitizer that contains at least 60% alcohol.   Do not share personal items.   Notify your provider:  o if you are in close contact with someone who has COVID  o or if you develop a fever of 100.4 or greater, sneezing, cough, sore throat, shortness of breath or body aches.

## 2021-02-19 ENCOUNTER — Encounter: Payer: Self-pay | Admitting: Internal Medicine

## 2021-02-19 DIAGNOSIS — I10 Essential (primary) hypertension: Secondary | ICD-10-CM | POA: Diagnosis not present

## 2021-02-19 NOTE — Progress Notes (Signed)
PERIOPERATIVE PRESCRIPTION FOR IMPLANTED CARDIAC DEVICE PROGRAMMING  Patient Information: Name:  Fernando Dean  DOB:  02/22/1950  MRN:  [id number]    Planned Procedure:  Abdominal Aortic Endovascular Graft Repair  Surgeon:  Dr. Monica Martinez  Date of Procedure:  02/26/2021  Cautery will be used.  Position during surgery:  Supine   Please send documentation back to:  [facility] (Fax # [phone]) Device Information:  Clinic EP Physician:  Cristopher Peru, MD   Device Type:  Pacemaker Manufacturer and Phone #:  [employer]: [phone] Pacemaker Dependent?:  No. Date of Last Device Check:  02/18/21 Normal Device Function?:  Yes.    Electrophysiologist's Recommendations:  Have magnet available. Provide continuous ECG monitoring when magnet is used or reprogramming is to be performed.  Procedure may interfere with device function.  Magnet should be placed over device during procedure.  Per Device Clinic Standing Orders, Wanda Plump, RN  3:24 PM 02/19/2021

## 2021-02-23 ENCOUNTER — Inpatient Hospital Stay (HOSPITAL_COMMUNITY)
Admission: RE | Admit: 2021-02-23 | Discharge: 2021-02-23 | Disposition: A | Discharge status: Home / Self Care | Payer: Medicare Other | Source: Ambulatory Visit

## 2021-02-23 NOTE — Progress Notes (Signed)
Patient didn't come for PAT appointment scheduled for today @ 3PM. This writer called the patient and patient verbalized that he knew his appointment is tomorrow, 02/24/2021, not today. The surgical scheduler was notified.

## 2021-02-24 ENCOUNTER — Encounter (HOSPITAL_COMMUNITY): Payer: Self-pay

## 2021-02-24 ENCOUNTER — Other Ambulatory Visit: Payer: Self-pay

## 2021-02-24 ENCOUNTER — Other Ambulatory Visit: Payer: Self-pay | Admitting: Vascular Surgery

## 2021-02-24 ENCOUNTER — Encounter (HOSPITAL_COMMUNITY)
Admission: RE | Admit: 2021-02-24 | Discharge: 2021-02-24 | Disposition: A | Discharge status: Home / Self Care | Payer: Medicare Other | Source: Ambulatory Visit | Attending: Vascular Surgery | Admitting: Vascular Surgery

## 2021-02-24 ENCOUNTER — Encounter (HOSPITAL_COMMUNITY): Payer: Self-pay | Admitting: Vascular Surgery

## 2021-02-24 DIAGNOSIS — F17210 Nicotine dependence, cigarettes, uncomplicated: Secondary | ICD-10-CM | POA: Insufficient documentation

## 2021-02-24 DIAGNOSIS — I129 Hypertensive chronic kidney disease with stage 1 through stage 4 chronic kidney disease, or unspecified chronic kidney disease: Secondary | ICD-10-CM | POA: Diagnosis not present

## 2021-02-24 DIAGNOSIS — Z0001 Encounter for general adult medical examination with abnormal findings: Secondary | ICD-10-CM | POA: Diagnosis not present

## 2021-02-24 DIAGNOSIS — E875 Hyperkalemia: Secondary | ICD-10-CM | POA: Insufficient documentation

## 2021-02-24 DIAGNOSIS — I7143 Infrarenal abdominal aortic aneurysm, without rupture: Secondary | ICD-10-CM | POA: Insufficient documentation

## 2021-02-24 DIAGNOSIS — I714 Abdominal aortic aneurysm, without rupture, unspecified: Secondary | ICD-10-CM | POA: Diagnosis not present

## 2021-02-24 DIAGNOSIS — Z01812 Encounter for preprocedural laboratory examination: Secondary | ICD-10-CM | POA: Insufficient documentation

## 2021-02-24 DIAGNOSIS — I4510 Unspecified right bundle-branch block: Secondary | ICD-10-CM | POA: Insufficient documentation

## 2021-02-24 DIAGNOSIS — N281 Cyst of kidney, acquired: Secondary | ICD-10-CM | POA: Insufficient documentation

## 2021-02-24 DIAGNOSIS — Z8249 Family history of ischemic heart disease and other diseases of the circulatory system: Secondary | ICD-10-CM | POA: Diagnosis not present

## 2021-02-24 DIAGNOSIS — R001 Bradycardia, unspecified: Secondary | ICD-10-CM | POA: Insufficient documentation

## 2021-02-24 DIAGNOSIS — F1010 Alcohol abuse, uncomplicated: Secondary | ICD-10-CM | POA: Insufficient documentation

## 2021-02-24 DIAGNOSIS — Z886 Allergy status to analgesic agent status: Secondary | ICD-10-CM | POA: Diagnosis not present

## 2021-02-24 DIAGNOSIS — R918 Other nonspecific abnormal finding of lung field: Secondary | ICD-10-CM | POA: Diagnosis not present

## 2021-02-24 DIAGNOSIS — Z79899 Other long term (current) drug therapy: Secondary | ICD-10-CM | POA: Insufficient documentation

## 2021-02-24 DIAGNOSIS — I10 Essential (primary) hypertension: Secondary | ICD-10-CM | POA: Diagnosis not present

## 2021-02-24 DIAGNOSIS — R195 Other fecal abnormalities: Secondary | ICD-10-CM | POA: Insufficient documentation

## 2021-02-24 DIAGNOSIS — Z950 Presence of cardiac pacemaker: Secondary | ICD-10-CM | POA: Insufficient documentation

## 2021-02-24 DIAGNOSIS — I444 Left anterior fascicular block: Secondary | ICD-10-CM | POA: Insufficient documentation

## 2021-02-24 DIAGNOSIS — F1290 Cannabis use, unspecified, uncomplicated: Secondary | ICD-10-CM | POA: Insufficient documentation

## 2021-02-24 DIAGNOSIS — I442 Atrioventricular block, complete: Secondary | ICD-10-CM | POA: Insufficient documentation

## 2021-02-24 DIAGNOSIS — I517 Cardiomegaly: Secondary | ICD-10-CM | POA: Diagnosis not present

## 2021-02-24 DIAGNOSIS — Z888 Allergy status to other drugs, medicaments and biological substances status: Secondary | ICD-10-CM | POA: Diagnosis not present

## 2021-02-24 DIAGNOSIS — I1310 Hypertensive heart and chronic kidney disease without heart failure, with stage 1 through stage 4 chronic kidney disease, or unspecified chronic kidney disease: Secondary | ICD-10-CM | POA: Insufficient documentation

## 2021-02-24 DIAGNOSIS — F17200 Nicotine dependence, unspecified, uncomplicated: Secondary | ICD-10-CM | POA: Diagnosis not present

## 2021-02-24 DIAGNOSIS — E785 Hyperlipidemia, unspecified: Secondary | ICD-10-CM | POA: Diagnosis not present

## 2021-02-24 DIAGNOSIS — M549 Dorsalgia, unspecified: Secondary | ICD-10-CM | POA: Diagnosis not present

## 2021-02-24 DIAGNOSIS — Z8611 Personal history of tuberculosis: Secondary | ICD-10-CM | POA: Insufficient documentation

## 2021-02-24 DIAGNOSIS — I351 Nonrheumatic aortic (valve) insufficiency: Secondary | ICD-10-CM | POA: Insufficient documentation

## 2021-02-24 DIAGNOSIS — N183 Chronic kidney disease, stage 3 unspecified: Secondary | ICD-10-CM | POA: Insufficient documentation

## 2021-02-24 DIAGNOSIS — N289 Disorder of kidney and ureter, unspecified: Secondary | ICD-10-CM | POA: Diagnosis not present

## 2021-02-24 DIAGNOSIS — R748 Abnormal levels of other serum enzymes: Secondary | ICD-10-CM | POA: Diagnosis not present

## 2021-02-24 DIAGNOSIS — N1831 Chronic kidney disease, stage 3a: Secondary | ICD-10-CM | POA: Diagnosis not present

## 2021-02-24 DIAGNOSIS — Z8052 Family history of malignant neoplasm of bladder: Secondary | ICD-10-CM | POA: Diagnosis not present

## 2021-02-24 DIAGNOSIS — G8929 Other chronic pain: Secondary | ICD-10-CM | POA: Diagnosis not present

## 2021-02-24 DIAGNOSIS — I739 Peripheral vascular disease, unspecified: Secondary | ICD-10-CM | POA: Diagnosis not present

## 2021-02-24 DIAGNOSIS — I452 Bifascicular block: Secondary | ICD-10-CM | POA: Diagnosis not present

## 2021-02-24 DIAGNOSIS — Z1211 Encounter for screening for malignant neoplasm of colon: Secondary | ICD-10-CM | POA: Diagnosis not present

## 2021-02-24 HISTORY — DX: Abdominal aortic aneurysm, without rupture, unspecified: I71.40

## 2021-02-24 HISTORY — DX: Nonrheumatic aortic (valve) insufficiency: I35.1

## 2021-02-24 HISTORY — DX: Chronic kidney disease, unspecified: N18.9

## 2021-02-24 HISTORY — DX: Other specified health status: Z78.9

## 2021-02-24 HISTORY — DX: Respiratory tuberculosis unspecified: A15.9

## 2021-02-24 HISTORY — DX: Alcohol use, unspecified, uncomplicated: F10.90

## 2021-02-24 LAB — CBC
HCT: 39.3 % (ref 39.0–52.0)
Hemoglobin: 11.8 g/dL — ABNORMAL LOW (ref 13.0–17.0)
MCH: 25.5 pg — ABNORMAL LOW (ref 26.0–34.0)
MCHC: 30 g/dL (ref 30.0–36.0)
MCV: 84.9 fL (ref 80.0–100.0)
Platelets: 365 10*3/uL (ref 150–400)
RBC: 4.63 MIL/uL (ref 4.22–5.81)
RDW: 17.5 % — ABNORMAL HIGH (ref 11.5–15.5)
WBC: 9.9 10*3/uL (ref 4.0–10.5)
nRBC: 0 % (ref 0.0–0.2)

## 2021-02-24 LAB — COMPREHENSIVE METABOLIC PANEL
ALT: 22 U/L (ref 0–44)
AST: 31 U/L (ref 15–41)
Albumin: 3.3 g/dL — ABNORMAL LOW (ref 3.5–5.0)
Alkaline Phosphatase: 126 U/L (ref 38–126)
Anion gap: 7 (ref 5–15)
BUN: 33 mg/dL — ABNORMAL HIGH (ref 8–23)
CO2: 23 mmol/L (ref 22–32)
Calcium: 9.4 mg/dL (ref 8.9–10.3)
Chloride: 106 mmol/L (ref 98–111)
Creatinine, Ser: 1.83 mg/dL — ABNORMAL HIGH (ref 0.61–1.24)
GFR, Estimated: 39 mL/min — ABNORMAL LOW (ref >60)
Glucose, Bld: 94 mg/dL (ref 70–99)
Potassium: 5.2 mmol/L — ABNORMAL HIGH (ref 3.5–5.1)
Sodium: 136 mmol/L (ref 135–145)
Total Bilirubin: 0.6 mg/dL (ref 0.3–1.2)
Total Protein: 8.1 g/dL (ref 6.5–8.1)

## 2021-02-24 LAB — URINALYSIS, ROUTINE W REFLEX MICROSCOPIC
Bilirubin Urine: NEGATIVE
Glucose, UA: NEGATIVE mg/dL
Hgb urine dipstick: NEGATIVE
Ketones, ur: NEGATIVE mg/dL
Leukocytes,Ua: NEGATIVE
Nitrite: NEGATIVE
Protein, ur: NEGATIVE mg/dL
Specific Gravity, Urine: 1.014 (ref 1.005–1.030)
pH: 5 (ref 5.0–8.0)

## 2021-02-24 LAB — PROTIME-INR
INR: 0.9 (ref 0.8–1.2)
Prothrombin Time: 12.1 seconds (ref 11.4–15.2)

## 2021-02-24 LAB — APTT: aPTT: 32 seconds (ref 24–36)

## 2021-02-24 NOTE — Progress Notes (Signed)
DUE TO COVID-19 ONLY ONE VISITOR IS ALLOWED TO COME WITH YOU AND STAY IN THE WAITING ROOM ONLY DURING PRE OP AND PROCEDURE DAY OF SURGERY.   Two VISITORS MAY VISIT WITH YOU AFTER SURGERY IN YOUR PRIVATE ROOM DURING VISITING HOURS ONLY!   PCP - Dr Wende Neighbors & [name], FNP Cardiologist - Dr Cristopher Peru  Chest x-ray - 02/09/21 (2V) EKG - 02/09/21 Stress Test - n/a ECHO - 02/05/21 Cardiac Cath - n/a  ICD Pacemaker/Loop - St Jude. Last remote check was on 02/18/21.  Next remote check is scheduled on 05/10/20.  Sleep Study -  n/a CPAP - none  Anesthesia review: Yes  STOP now taking any Aspirin (unless otherwise instructed by your surgeon), Aleve, Naproxen, Ibuprofen, Motrin, Advil, Goody's, BC's, all herbal medications, fish oil, and all vitamins.   Coronavirus Screening Covid test is scheduled at PAT appointment. Patient stated he was told by MD's office to go [facility]  Patient even had instructions to the Covid testing location which he showed me at the PAT appt.  I dId not obtain covid test at PAT appointment for the above reason.  Do you have any of the following symptoms:  Cough yes/no: No Fever (>100.34F)  yes/no: No Runny nose yes/no: No Sore throat yes/no: No Difficulty breathing/shortness of breath  yes/no: No  Have you traveled in the last 14 days and where? yes/no: No  Patient verbalized understanding of instructions that were given to them at the PAT appointment. Patient was also instructed that they will need to review over the PAT instructions again at home before surgery.

## 2021-02-24 NOTE — Progress Notes (Deleted)
DUE TO COVID-19 ONLY ONE VISITOR IS ALLOWED TO COME WITH YOU AND STAY IN THE WAITING ROOM ONLY DURING PRE OP AND PROCEDURE DAY OF SURGERY.   Two VISITORS MAY VISIT WITH YOU AFTER SURGERY IN YOUR PRIVATE ROOM DURING VISITING HOURS ONLY!   PCP - Dr Wende Neighbors & [name], FNP Cardiologist - Dr Cristopher Peru  Chest x-ray - 02/09/21 (2V) EKG - 02/09/21 Stress Test - n/a ECHO - n/a Cardiac Cath - n/a  ICD Pacemaker/Loop - St Jude.  Last remote check was on 02/18/21.    Sleep Study -  n/a CPAP - none  Anesthesia review: Yes  STOP now taking any Aspirin (unless otherwise instructed by your surgeon), Aleve, Naproxen, Ibuprofen, Motrin, Advil, Goody's, BC's, all herbal medications, fish oil, and all vitamins.   Coronavirus Screening Patient stated he was told by MD's office to go get a covid test on 02/24/21 at [facility]).  Patient even had instructions to the Covid testing location which he showed me at the PAT appt.  I dId not obtain covid test at PAT appointment for the above reason.     Do you have any of the following symptoms:  Cough yes/no: No Fever (>100.41F)  yes/no: No Runny nose yes/no: No Sore throat yes/no: No Difficulty breathing/shortness of breath  yes/no: No  Have you traveled in the last 14 days and where? yes/no: No  Patient verbalized understanding of instructions that were given to them at the PAT appointment. Patient was also instructed that they will need to review over the PAT instructions again at home before surgery.

## 2021-02-25 ENCOUNTER — Encounter (HOSPITAL_COMMUNITY): Payer: Self-pay

## 2021-02-25 LAB — SARS CORONAVIRUS 2 (TAT 6-24 HRS): SARS Coronavirus 2: NEGATIVE

## 2021-02-25 NOTE — Anesthesia Preprocedure Evaluation (Addendum)
Anesthesia Evaluation  Patient identified by MRN, date of birth, ID band Patient awake    Reviewed: Allergy & Precautions, H&P , NPO status , Patient's Chart, lab work & pertinent test results  Airway Mallampati: II   Neck ROM: full    Dental   Pulmonary Current Smoker and Patient abstained from smoking.,    breath sounds clear to auscultation       Cardiovascular hypertension, + Peripheral Vascular Disease  + pacemaker + Valvular Problems/Murmurs AI  Rhythm:regular Rate:Normal  TTE (02/05/2021): EF 60-65%, moderate AI. Dilated LA and RA.   Neuro/Psych    GI/Hepatic   Endo/Other    Renal/GU Renal InsufficiencyRenal disease     Musculoskeletal   Abdominal   Peds  Hematology   Anesthesia Other Findings   Reproductive/Obstetrics                            Anesthesia Physical Anesthesia Plan  ASA: 3  Anesthesia Plan: General   Post-op Pain Management:    Induction: Intravenous  PONV Risk Score and Plan: 1 and Ondansetron, Dexamethasone and Treatment may vary due to age or medical condition  Airway Management Planned: Oral ETT  Additional Equipment: Arterial line  Intra-op Plan:   Post-operative Plan: Extubation in OR  Informed Consent: I have reviewed the patients History and Physical, chart, labs and discussed the procedure including the risks, benefits and alternatives for the proposed anesthesia with the patient or authorized representative who has indicated his/her understanding and acceptance.     Dental advisory given  Plan Discussed with: CRNA, Anesthesiologist and Surgeon  Anesthesia Plan Comments: (See PAT note written 02/25/2021 by Myra Gianotti, PA-C. For ISTAT on arrival to re-evaluate potassium.  )      Anesthesia Quick Evaluation

## 2021-02-25 NOTE — Progress Notes (Addendum)
Anesthesia Chart Review:  Case: [id number] Date/Time: 02/26/21 0715   Procedure: ABDOMINAL AORTIC ENDOVASCULAR STENT GRAFT REPAIR   Anesthesia type: General   Pre-op diagnosis: AAA   Location: [facility] OR ROOM 16 / [place] OR   Surgeons: Marty Heck, MD       DISCUSSION: Patient is a 71 year old male scheduled for the above procedure.  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 As of 02/01/21, he was drinking 3-4 shots of vodka daily and using marijuana 2-3 times/week.  [facility] admission 02/04/21-02/09/21 for bradycardia with CHB. He had GI consult on 02/01/21 by Dr. Jenetta Downer for consideration of colonoscopy for +FOBT. He was noted to be bradycardic (HR 37), hypertensive (BP 193/77), and with a abdominal pulsatile mass. He was referred to cardiology and also for abdominal US. He was instructed to go to the ED if symptomatic. 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 CTA chest/abd/pelvis showed 5.8 infrarenal AAA. Cardiology was consulted. Echo showed normal LV/RV systolic function, moderate AI, estimated RVSP 37.3 mmHg. S/p Abbott PPM 02/08/21. Amlodipine and Coreg added for HTN. Creatinine elevated at 1.86 with no recent comparison labs, but CKD suspected. He had a severely atrophic left kidney on imaging and cortical scaring with multiple small renal cysts on the right kidney. HGB stable primarily in the 11 range with on-going out-patient GI follow-up planned. Alcohol and tobacco cessation encouraged. Out-patient vascular surgery consult for AAA scheduled, and seen by Dr. Carlis Abbott on 02/16/21 with above procedure recommended.  Perioperative cardiac device Prescription: Device Information: Clinic EP Physician:  Cristopher Peru, MD  Device Type:   Pacemaker Manufacturer and Phone #:  St. Jude/Abbott: [phone] Pacemaker Dependent?:  No. Date of Last Device Check:  02/18/21         Normal Device Function?:  Yes.     Electrophysiologist's Recommendations: Have magnet available. Provide continuous ECG monitoring when magnet is used or reprogramming is to be performed.  Procedure may interfere with device function.  Magnet should be placed over device during procedure.  02/24/21 labs stable with Creatinine 1.83, HGB 11.8. K is mildly elevated at 5.2. AST/ALT, PT/PTT, PLT count normal. 02/25/20 presurgical COVID-19 test negative. Anesthesia team to evaluate on the day of surgery. (UPDATE 02/25/21 1:18 PM: PCP office had contacted [facility] because 02/19/21 labs there showed a K+ 6.3. Creatinine was 1.59. These labs can be viewed in [place]. Given issue of hyperkalemia in the past week, will order an ISTAT8 on arrival to get an updated potassium level to ensure result is felt acceptable for OR. )    [facility]: BP (!) 156/91    Pulse 62    Temp 36.5 C (Oral)    Resp 18    Ht 6' (1.829 m)    Wt 77.7 kg    SpO2 100%    BMI 23.22 kg/m    PROVIDERS: [name], FNP is PCP  Cristopher Peru, MD is cardiologist [name], MD is GI   LABS: Labs reviewed: Acceptable for surgery. See DISCUSSION. (all labs ordered are listed, but only abnormal results are displayed)  Labs Reviewed  CBC - Abnormal; Notable for the following components:      Result Value   Hemoglobin 11.8 (*)    MCH 25.5 (*)    RDW  17.5 (*)    All other components within normal limits  COMPREHENSIVE METABOLIC PANEL - Abnormal; Notable for the following components:   Potassium 5.2 (*)    BUN 33 (*)    Creatinine, Ser 1.83 (*)    Albumin 3.3 (*)    GFR, Estimated 39 (*)    All other components within normal limits  PROTIME-INR  APTT  URINALYSIS, ROUTINE W REFLEX MICROSCOPIC  TYPE AND SCREEN     IMAGES: CXR 02/09/21: FINDINGS: Left pacer in place with leads in  the right atrium and right ventricle. No pneumothorax. Heart is normal size. Lungs clear. No effusions or acute bony abnormality. IMPRESSION: Left pacer placement without pneumothorax. No acute cardiopulmonary disease.   CTA Chest/abd/pelvis 02/04/21: IMPRESSION: 1. 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 No CT evidence of acute rupture or other acute aortic pathology. Recommend referral to a vascular specialist. This recommendation follows ACR consensus guidelines: White Paper of the ACR Incidental Findings Committee II on Vascular Findings. J Am Coll Radiol 2013; 10:789-794. 2. Normal contour and caliber of the thoracic aorta. No evidence of thoracic aortic aneurysm, dissection, or other acute aortic pathology. 3. Enlargement of the main pulmonary artery, as can be seen in pulmonary hypertension. 4. Emphysema and diffuse bilateral bronchial wall thickening. 5. Cardiomegaly and coronary artery disease. - Aortic Atherosclerosis (ICD10-I70.0) and Emphysema (ICD10-J43.9).   EKG: 02/09/21: Atrial-sensed ventricular-paced rhythm New since previous tracing Abnormal ECG Confirmed by Quay Burow [id number]) on 02/09/2021 2:20:25 PM   CV: Echo 02/05/21: IMPRESSIONS   1. Left ventricular ejection fraction, by estimation, is 60 to 65%. The  left ventricle has normal function. The left ventricle has no regional  wall motion abnormalities. There is mild left ventricular hypertrophy.  Left ventricular diastolic parameters  are indeterminate.   2. Right ventricular systolic function is normal. The right ventricular  size is not well visualized. There is mildly elevated pulmonary artery  systolic pressure. The tricuspid regurgitant  velocity is 2.93 m/s, and with an assumed right atrial pressure of 3  mmHg,  the estimated right ventricular systolic pressure is 29.4 mmHg.   3. Left atrial size was severely dilated.   4. Right atrial size was moderately dilated.   5. The mitral valve is grossly normal. Trivial mitral valve  regurgitation.   6. AI jet against the anterior mitral valve leaflet. Jet eccentric P1/2T  may underestimate. The aortic valve is calcified. Aortic valve  regurgitation is moderate. Aortic regurgitation PHT measures 893  msec. Aortic valve mean gradient measures  7.0 mmHg. Aortic valve peak gradient measures 16.8 mmHg. Aortic valve  area, by VTI measures 3.20 cm.   7. The aortic root and ascending aorta are structurally normal, with no evidence of dilatation.   8. The inferior vena cava is normal in size with greater than 50%  respiratory variability, suggesting right atrial pressure of 3 mmHg.   9. No atrial level shunt detected by color flow Doppler.    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 -  St Jude/Abbott Assurity MRI 2272 dual chamber PPM   Tuberculosis    as a child, was treated    Past Surgical History:  Procedure Laterality Date   HERNIA REPAIR Left 10/25/2006   PACEMAKER IMPLANT N/A 02/08/2021   Procedure: PACEMAKER IMPLANT;  Surgeon: Deboraha Sprang, MD;  Location: [place] [facility];  Service: Cardiovascular;  Laterality: N/A;    MEDICATIONS:  amLODipine (NORVASC) 10 MG tablet   carvedilol (COREG) 6.25 MG tablet   Multiple Vitamins-Minerals (MULTIVITAMIN WITH MINERALS) tablet   No current facility-administered medications for this encounter.    Myra Gianotti, PA-C Surgical Short Stay/Anesthesiology [facility] Phone [phone] [facility] Phone [phone] 02/25/2021 10:02 AM

## 2021-02-26 ENCOUNTER — Encounter (HOSPITAL_COMMUNITY): Payer: Self-pay | Admitting: Vascular Surgery

## 2021-02-26 ENCOUNTER — Inpatient Hospital Stay (HOSPITAL_COMMUNITY): Payer: Medicare Other | Admitting: Vascular Surgery

## 2021-02-26 ENCOUNTER — Encounter (HOSPITAL_COMMUNITY)
Admission: RE | Disposition: A | Discharge status: Home / Self Care | Payer: Self-pay | Source: Home / Self Care | Attending: Vascular Surgery

## 2021-02-26 ENCOUNTER — Other Ambulatory Visit: Payer: Self-pay

## 2021-02-26 ENCOUNTER — Inpatient Hospital Stay (HOSPITAL_COMMUNITY)
Admission: RE | Admit: 2021-02-26 | Discharge: 2021-02-27 | DRG: 269 | Disposition: A | Discharge status: Home / Self Care | Payer: Medicare Other | Attending: Vascular Surgery | Admitting: Vascular Surgery

## 2021-02-26 ENCOUNTER — Inpatient Hospital Stay (HOSPITAL_COMMUNITY): Payer: Medicare Other

## 2021-02-26 DIAGNOSIS — N289 Disorder of kidney and ureter, unspecified: Secondary | ICD-10-CM | POA: Diagnosis not present

## 2021-02-26 DIAGNOSIS — I129 Hypertensive chronic kidney disease with stage 1 through stage 4 chronic kidney disease, or unspecified chronic kidney disease: Secondary | ICD-10-CM | POA: Diagnosis present

## 2021-02-26 DIAGNOSIS — M549 Dorsalgia, unspecified: Secondary | ICD-10-CM | POA: Diagnosis present

## 2021-02-26 DIAGNOSIS — Z8052 Family history of malignant neoplasm of bladder: Secondary | ICD-10-CM | POA: Diagnosis not present

## 2021-02-26 DIAGNOSIS — G8929 Other chronic pain: Secondary | ICD-10-CM | POA: Diagnosis not present

## 2021-02-26 DIAGNOSIS — Z01812 Encounter for preprocedural laboratory examination: Secondary | ICD-10-CM

## 2021-02-26 DIAGNOSIS — Z8249 Family history of ischemic heart disease and other diseases of the circulatory system: Secondary | ICD-10-CM | POA: Diagnosis not present

## 2021-02-26 DIAGNOSIS — I442 Atrioventricular block, complete: Secondary | ICD-10-CM | POA: Diagnosis present

## 2021-02-26 DIAGNOSIS — I7143 Infrarenal abdominal aortic aneurysm, without rupture: Principal | ICD-10-CM | POA: Diagnosis present

## 2021-02-26 DIAGNOSIS — Z8679 Personal history of other diseases of the circulatory system: Secondary | ICD-10-CM

## 2021-02-26 DIAGNOSIS — F17210 Nicotine dependence, cigarettes, uncomplicated: Secondary | ICD-10-CM | POA: Diagnosis present

## 2021-02-26 DIAGNOSIS — N183 Chronic kidney disease, stage 3 unspecified: Secondary | ICD-10-CM | POA: Diagnosis present

## 2021-02-26 DIAGNOSIS — I739 Peripheral vascular disease, unspecified: Secondary | ICD-10-CM | POA: Diagnosis not present

## 2021-02-26 DIAGNOSIS — I714 Abdominal aortic aneurysm, without rupture, unspecified: Secondary | ICD-10-CM

## 2021-02-26 DIAGNOSIS — Z950 Presence of cardiac pacemaker: Secondary | ICD-10-CM | POA: Diagnosis not present

## 2021-02-26 DIAGNOSIS — Z8611 Personal history of tuberculosis: Secondary | ICD-10-CM | POA: Diagnosis not present

## 2021-02-26 DIAGNOSIS — Z79899 Other long term (current) drug therapy: Secondary | ICD-10-CM | POA: Diagnosis not present

## 2021-02-26 DIAGNOSIS — Z886 Allergy status to analgesic agent status: Secondary | ICD-10-CM | POA: Diagnosis not present

## 2021-02-26 DIAGNOSIS — Z9889 Other specified postprocedural states: Secondary | ICD-10-CM

## 2021-02-26 DIAGNOSIS — Z888 Allergy status to other drugs, medicaments and biological substances status: Secondary | ICD-10-CM | POA: Diagnosis not present

## 2021-02-26 DIAGNOSIS — I10 Essential (primary) hypertension: Secondary | ICD-10-CM | POA: Diagnosis not present

## 2021-02-26 DIAGNOSIS — I452 Bifascicular block: Secondary | ICD-10-CM | POA: Diagnosis present

## 2021-02-26 HISTORY — PX: ABDOMINAL AORTIC ENDOVASCULAR STENT GRAFT: SHX5707

## 2021-02-26 LAB — CBC
HCT: 35.2 % — ABNORMAL LOW (ref 39.0–52.0)
Hemoglobin: 10.8 g/dL — ABNORMAL LOW (ref 13.0–17.0)
MCH: 25.3 pg — ABNORMAL LOW (ref 26.0–34.0)
MCHC: 30.7 g/dL (ref 30.0–36.0)
MCV: 82.4 fL (ref 80.0–100.0)
Platelets: 273 10*3/uL (ref 150–400)
RBC: 4.27 MIL/uL (ref 4.22–5.81)
RDW: 17.4 % — ABNORMAL HIGH (ref 11.5–15.5)
WBC: 13.2 10*3/uL — ABNORMAL HIGH (ref 4.0–10.5)
nRBC: 0 % (ref 0.0–0.2)

## 2021-02-26 LAB — MAGNESIUM: Magnesium: 1.9 mg/dL (ref 1.7–2.4)

## 2021-02-26 LAB — BASIC METABOLIC PANEL
Anion gap: 7 (ref 5–15)
BUN: 28 mg/dL — ABNORMAL HIGH (ref 8–23)
CO2: 20 mmol/L — ABNORMAL LOW (ref 22–32)
Calcium: 9.2 mg/dL (ref 8.9–10.3)
Chloride: 106 mmol/L (ref 98–111)
Creatinine, Ser: 1.53 mg/dL — ABNORMAL HIGH (ref 0.61–1.24)
GFR, Estimated: 49 mL/min — ABNORMAL LOW (ref >60)
Glucose, Bld: 118 mg/dL — ABNORMAL HIGH (ref 70–99)
Potassium: 5.1 mmol/L (ref 3.5–5.1)
Sodium: 133 mmol/L — ABNORMAL LOW (ref 135–145)

## 2021-02-26 LAB — APTT: aPTT: 38 seconds — ABNORMAL HIGH (ref 24–36)

## 2021-02-26 LAB — POCT I-STAT, CHEM 8
BUN: 36 mg/dL — ABNORMAL HIGH (ref 8–23)
Calcium, Ion: 1.21 mmol/L (ref 1.15–1.40)
Chloride: 108 mmol/L (ref 98–111)
Creatinine, Ser: 1.8 mg/dL — ABNORMAL HIGH (ref 0.61–1.24)
Glucose, Bld: 90 mg/dL (ref 70–99)
HCT: 41 % (ref 39.0–52.0)
Hemoglobin: 13.9 g/dL (ref 13.0–17.0)
Potassium: 4.8 mmol/L (ref 3.5–5.1)
Sodium: 140 mmol/L (ref 135–145)
TCO2: 24 mmol/L (ref 22–32)

## 2021-02-26 LAB — PROTIME-INR
INR: 1 (ref 0.8–1.2)
Prothrombin Time: 13.6 seconds (ref 11.4–15.2)

## 2021-02-26 LAB — POCT ACTIVATED CLOTTING TIME: Activated Clotting Time: 257 seconds

## 2021-02-26 LAB — ABO/RH: ABO/RH(D): O POS

## 2021-02-26 LAB — PREPARE RBC (CROSSMATCH)

## 2021-02-26 SURGERY — INSERTION, ENDOVASCULAR STENT GRAFT, AORTA, ABDOMINAL
Anesthesia: General | Site: Abdomen | Laterality: Bilateral

## 2021-02-26 MED ORDER — HYDRALAZINE HCL 20 MG/ML IJ SOLN: 5.0000 mg | INTRAMUSCULAR | Status: DC | PRN

## 2021-02-26 MED ORDER — CHLORHEXIDINE GLUCONATE CLOTH 2 % EX PADS: 6.0000 | MEDICATED_PAD | Freq: Once | CUTANEOUS | Status: DC

## 2021-02-26 MED ORDER — SODIUM CHLORIDE 0.9 % IV SOLN: INTRAVENOUS | Status: DC

## 2021-02-26 MED ORDER — ONDANSETRON HCL 4 MG/2ML IJ SOLN: 4.0000 mg | Freq: Four times a day (QID) | INTRAMUSCULAR | Status: DC | PRN

## 2021-02-26 MED ORDER — METOPROLOL TARTRATE 5 MG/5ML IV SOLN: 2.0000 mg | INTRAVENOUS | Status: DC | PRN

## 2021-02-26 MED ORDER — OXYCODONE HCL 5 MG PO TABS: 5.0000 mg | ORAL_TABLET | Freq: Once | ORAL | Status: DC | PRN

## 2021-02-26 MED ORDER — MORPHINE SULFATE (PF) 2 MG/ML IV SOLN: 2.0000 mg | INTRAVENOUS | Status: DC | PRN

## 2021-02-26 MED ORDER — GUAIFENESIN-DM 100-10 MG/5ML PO SYRP: 15.0000 mL | ORAL_SOLUTION | ORAL | Status: DC | PRN

## 2021-02-26 MED ORDER — IODIXANOL 320 MG/ML IV SOLN
INTRAVENOUS | Status: DC | PRN
  Administered 2021-02-26: 73.6 mL

## 2021-02-26 MED ORDER — PROPOFOL 10 MG/ML IV BOLUS
INTRAVENOUS | Status: DC | PRN
  Administered 2021-02-26: 100 mg via INTRAVENOUS
  Administered 2021-02-26: 20 mg via INTRAVENOUS

## 2021-02-26 MED ORDER — 0.9 % SODIUM CHLORIDE (POUR BTL) OPTIME
TOPICAL | Status: DC | PRN
  Administered 2021-02-26: 1000 mL

## 2021-02-26 MED ORDER — FENTANYL CITRATE (PF) 100 MCG/2ML IJ SOLN: 25.0000 ug | INTRAMUSCULAR | Status: DC | PRN

## 2021-02-26 MED ORDER — CHLORHEXIDINE GLUCONATE CLOTH 2 % EX PADS: 6.0000 | MEDICATED_PAD | Freq: Every day | CUTANEOUS | Status: DC

## 2021-02-26 MED ORDER — ACETAMINOPHEN 650 MG RE SUPP: 325.0000 mg | RECTAL | Status: DC | PRN

## 2021-02-26 MED ORDER — CEFAZOLIN SODIUM-DEXTROSE 2-4 GM/100ML-% IV SOLN
2.0000 g | INTRAVENOUS | Status: AC
  Administered 2021-02-26: 2 g via INTRAVENOUS

## 2021-02-26 MED ORDER — HEPARIN 6000 UNIT IRRIGATION SOLUTION
Status: DC | PRN
  Administered 2021-02-26: 1

## 2021-02-26 MED ORDER — LACTATED RINGERS IV SOLN
INTRAVENOUS | Status: DC | PRN
Start: 2021-02-26 — End: 2021-02-26

## 2021-02-26 MED ORDER — PANTOPRAZOLE SODIUM 40 MG PO TBEC
40.0000 mg | DELAYED_RELEASE_TABLET | Freq: Every day | ORAL | Status: DC
  Administered 2021-02-26 – 2021-02-27 (×2): 40 mg via ORAL
  Filled 2021-02-26 (×2): qty 1

## 2021-02-26 MED ORDER — ROCURONIUM BROMIDE 10 MG/ML (PF) SYRINGE
PREFILLED_SYRINGE | INTRAVENOUS | Status: DC | PRN
  Administered 2021-02-26: 50 mg via INTRAVENOUS

## 2021-02-26 MED ORDER — MAGNESIUM SULFATE 2 GM/50ML IV SOLN: 2.0000 g | Freq: Every day | INTRAVENOUS | Status: DC | PRN

## 2021-02-26 MED ORDER — ACETAMINOPHEN 325 MG PO TABS: 325.0000 mg | ORAL_TABLET | ORAL | Status: DC | PRN

## 2021-02-26 MED ORDER — LIDOCAINE 2% (20 MG/ML) 5 ML SYRINGE
INTRAMUSCULAR | Status: DC | PRN
  Administered 2021-02-26: 40 mg via INTRAVENOUS

## 2021-02-26 MED ORDER — HEPARIN 6000 UNIT IRRIGATION SOLUTION
Status: AC
  Filled 2021-02-26: qty 500

## 2021-02-26 MED ORDER — DEXAMETHASONE SODIUM PHOSPHATE 10 MG/ML IJ SOLN
INTRAMUSCULAR | Status: DC | PRN
Start: 2021-02-26 — End: 2021-02-26
  Administered 2021-02-26: 5 mg via INTRAVENOUS

## 2021-02-26 MED ORDER — DOCUSATE SODIUM 100 MG PO CAPS
100.0000 mg | ORAL_CAPSULE | Freq: Every day | ORAL | Status: DC
  Administered 2021-02-27: 100 mg via ORAL
  Filled 2021-02-26: qty 1

## 2021-02-26 MED ORDER — ORAL CARE MOUTH RINSE: 15.0000 mL | Freq: Once | OROMUCOSAL | Status: AC

## 2021-02-26 MED ORDER — ALUM & MAG HYDROXIDE-SIMETH 200-200-20 MG/5ML PO SUSP: 15.0000 mL | ORAL | Status: DC | PRN

## 2021-02-26 MED ORDER — AMLODIPINE BESYLATE 10 MG PO TABS
10.0000 mg | ORAL_TABLET | Freq: Every day | ORAL | Status: DC
  Administered 2021-02-27: 10 mg via ORAL
  Filled 2021-02-26: qty 1

## 2021-02-26 MED ORDER — OXYCODONE-ACETAMINOPHEN 5-325 MG PO TABS
1.0000 | ORAL_TABLET | ORAL | Status: DC | PRN
  Administered 2021-02-26 – 2021-02-27 (×2): 2 via ORAL
  Filled 2021-02-26 (×2): qty 2

## 2021-02-26 MED ORDER — ONDANSETRON HCL 4 MG/2ML IJ SOLN
INTRAMUSCULAR | Status: DC | PRN
  Administered 2021-02-26: 4 mg via INTRAVENOUS

## 2021-02-26 MED ORDER — POTASSIUM CHLORIDE CRYS ER 20 MEQ PO TBCR: 20.0000 meq | EXTENDED_RELEASE_TABLET | Freq: Every day | ORAL | Status: DC | PRN

## 2021-02-26 MED ORDER — LABETALOL HCL 5 MG/ML IV SOLN: 10.0000 mg | INTRAVENOUS | Status: DC | PRN

## 2021-02-26 MED ORDER — SODIUM CHLORIDE 0.9 % IV SOLN: 500.0000 mL | Freq: Once | INTRAVENOUS | Status: DC | PRN

## 2021-02-26 MED ORDER — HEPARIN SODIUM (PORCINE) 1000 UNIT/ML IJ SOLN
INTRAMUSCULAR | Status: DC | PRN
  Administered 2021-02-26: 8000 [IU] via INTRAVENOUS
  Administered 2021-02-26: 1000 [IU] via INTRAVENOUS

## 2021-02-26 MED ORDER — LACTATED RINGERS IV SOLN: INTRAVENOUS | Status: DC

## 2021-02-26 MED ORDER — PROTAMINE SULFATE 10 MG/ML IV SOLN
INTRAVENOUS | Status: DC | PRN
  Administered 2021-02-26: 50 mg via INTRAVENOUS

## 2021-02-26 MED ORDER — SUGAMMADEX SODIUM 200 MG/2ML IV SOLN
INTRAVENOUS | Status: DC | PRN
  Administered 2021-02-26: 150 mg via INTRAVENOUS

## 2021-02-26 MED ORDER — PROPOFOL 10 MG/ML IV BOLUS
INTRAVENOUS | Status: AC
  Filled 2021-02-26: qty 20

## 2021-02-26 MED ORDER — SODIUM CHLORIDE 0.9% IV SOLUTION: Freq: Once | INTRAVENOUS | Status: DC

## 2021-02-26 MED ORDER — FENTANYL CITRATE (PF) 250 MCG/5ML IJ SOLN
INTRAMUSCULAR | Status: AC
  Filled 2021-02-26: qty 5

## 2021-02-26 MED ORDER — PHENYLEPHRINE HCL-NACL 20-0.9 MG/250ML-% IV SOLN
INTRAVENOUS | Status: DC | PRN
  Administered 2021-02-26: 20 ug/min via INTRAVENOUS

## 2021-02-26 MED ORDER — OXYCODONE HCL 5 MG/5ML PO SOLN: 5.0000 mg | Freq: Once | ORAL | Status: DC | PRN

## 2021-02-26 MED ORDER — EPHEDRINE SULFATE-NACL 50-0.9 MG/10ML-% IV SOSY
PREFILLED_SYRINGE | INTRAVENOUS | Status: DC | PRN
  Administered 2021-02-26: 2.5 mg via INTRAVENOUS
  Administered 2021-02-26: 5 mg via INTRAVENOUS
  Administered 2021-02-26: 2.5 mg via INTRAVENOUS
  Administered 2021-02-26: 5 mg via INTRAVENOUS

## 2021-02-26 MED ORDER — CARVEDILOL 6.25 MG PO TABS
6.2500 mg | ORAL_TABLET | Freq: Two times a day (BID) | ORAL | Status: DC
  Administered 2021-02-26 – 2021-02-27 (×2): 6.25 mg via ORAL
  Filled 2021-02-26 (×2): qty 1

## 2021-02-26 MED ORDER — ASPIRIN EC 81 MG PO TBEC
81.0000 mg | DELAYED_RELEASE_TABLET | Freq: Every day | ORAL | Status: DC
  Administered 2021-02-27: 81 mg via ORAL
  Filled 2021-02-26: qty 1

## 2021-02-26 MED ORDER — SIMVASTATIN 20 MG PO TABS
10.0000 mg | ORAL_TABLET | Freq: Every day | ORAL | Status: DC
  Administered 2021-02-26: 10 mg via ORAL
  Filled 2021-02-26: qty 1

## 2021-02-26 MED ORDER — FENTANYL CITRATE (PF) 250 MCG/5ML IJ SOLN
INTRAMUSCULAR | Status: DC | PRN
  Administered 2021-02-26 (×4): 50 ug via INTRAVENOUS

## 2021-02-26 MED ORDER — CHLORHEXIDINE GLUCONATE 0.12 % MT SOLN
15.0000 mL | Freq: Once | OROMUCOSAL | Status: AC
  Administered 2021-02-26: 15 mL via OROMUCOSAL

## 2021-02-26 MED ORDER — CEFAZOLIN SODIUM-DEXTROSE 2-4 GM/100ML-% IV SOLN
2.0000 g | Freq: Three times a day (TID) | INTRAVENOUS | Status: AC
  Administered 2021-02-26 (×2): 2 g via INTRAVENOUS
  Filled 2021-02-26 (×2): qty 100

## 2021-02-26 MED ORDER — PHENOL 1.4 % MT LIQD: 1.0000 | OROMUCOSAL | Status: DC | PRN

## 2021-02-26 SURGICAL SUPPLY — 71 items
ADH SKN CLS APL DERMABOND .7 (GAUZE/BANDAGES/DRESSINGS) ×2
BAG COUNTER SPONGE SURGICOUNT (BAG) ×3 IMPLANT
BAG SNAP BAND KOVER 36X36 (MISCELLANEOUS) ×1 IMPLANT
BAG SPNG CNTER NS LX DISP (BAG) ×1
CANISTER SUCT 3000ML PPV (MISCELLANEOUS) ×3 IMPLANT
CATH BEACON 5 .035 40 KMP TP (CATHETERS) IMPLANT
CATH BEACON 5 .035 65 KMP TIP (CATHETERS) ×1 IMPLANT
CATH BEACON 5 .038 40 KMP TP (CATHETERS) ×2
CATH BEACON 5.038 65CM KMP-01 (CATHETERS) IMPLANT
CATH OMNI FLUSH .035X70CM (CATHETERS) ×1 IMPLANT
CATH VANSCH 5FR 6CM (CATHETERS) ×1 IMPLANT
CLOSURE PERCLOSE PROSTYLE (VASCULAR PRODUCTS) ×4 IMPLANT
DERMABOND ADVANCED (GAUZE/BANDAGES/DRESSINGS) ×2
DERMABOND ADVANCED .7 DNX12 (GAUZE/BANDAGES/DRESSINGS) ×2 IMPLANT
DEVICE CLOSURE PERCLS PRGLD 6F (VASCULAR PRODUCTS) IMPLANT
DEVICE TORQUE KENDALL .025-038 (MISCELLANEOUS) ×3 IMPLANT
DRSG TEGADERM 2-3/8X2-3/4 SM (GAUZE/BANDAGES/DRESSINGS) ×6 IMPLANT
DRYSEAL FLEXSHEATH 12FR 33CM (SHEATH) ×2
DRYSEAL FLEXSHEATH 18FR 33CM (SHEATH) ×1
ELECT REM PT RETURN 9FT ADLT (ELECTROSURGICAL) ×4
ELECTRODE REM PT RTRN 9FT ADLT (ELECTROSURGICAL) ×4 IMPLANT
EXCLDR TRNK ENDO 32X14.5X14 18 (Endovascular Graft) ×2 IMPLANT
EXCLUDER TNK END 32X14.5X14 18 (Endovascular Graft) IMPLANT
GAUZE SPONGE 2X2 8PLY STRL LF (GAUZE/BANDAGES/DRESSINGS) ×2 IMPLANT
GLOVE SRG 8 PF TXTR STRL LF DI (GLOVE) ×2 IMPLANT
GLOVE SURG ENC MOIS LTX SZ7.5 (GLOVE) ×3 IMPLANT
GLOVE SURG MICRO LTX SZ6.5 (GLOVE) ×1 IMPLANT
GLOVE SURG PR MICRO ENCORE 7.5 (GLOVE) ×2 IMPLANT
GLOVE SURG UNDER POLY LF SZ7 (GLOVE) ×2 IMPLANT
GLOVE SURG UNDER POLY LF SZ8 (GLOVE) ×4
GOWN STRL REUS W/ TWL LRG LVL3 (GOWN DISPOSABLE) ×6 IMPLANT
GOWN STRL REUS W/ TWL XL LVL3 (GOWN DISPOSABLE) ×2 IMPLANT
GOWN STRL REUS W/TWL LRG LVL3 (GOWN DISPOSABLE) ×6
GOWN STRL REUS W/TWL XL LVL3 (GOWN DISPOSABLE) ×2
GRAFT BALLN CATH 65CM (STENTS) IMPLANT
GUIDEWIRE ANGLED .035X150CM (WIRE) ×3 IMPLANT
KIT BASIN OR (CUSTOM PROCEDURE TRAY) ×3 IMPLANT
KIT DRAIN CSF ACCUDRAIN (MISCELLANEOUS) IMPLANT
KIT TURNOVER KIT B (KITS) ×3 IMPLANT
LEG CONTRALATERAL 16X12X14 (Vascular Products) ×2 IMPLANT
LEG CONTRALATERAL 16X16X9.5 (Endovascular Graft) ×1 IMPLANT
NS IRRIG 1000ML POUR BTL (IV SOLUTION) ×3 IMPLANT
PACK ENDOVASCULAR (PACKS) ×3 IMPLANT
PAD ARMBOARD 7.5X6 YLW CONV (MISCELLANEOUS) ×6 IMPLANT
PERCLOSE PROGLIDE 6F (VASCULAR PRODUCTS)
SET MICROPUNCTURE 5F STIFF (MISCELLANEOUS) ×4 IMPLANT
SHEATH BRITE TIP 8FR 23CM (SHEATH) ×1 IMPLANT
SHEATH DRYSEAL FLEX 12FR 33CM (SHEATH) IMPLANT
SHEATH DRYSEAL FLEX 18FR 33CM (SHEATH) IMPLANT
SHEATH PINNACLE 5F 10CM (SHEATH) ×1 IMPLANT
SHEATH PINNACLE 8F 10CM (SHEATH) ×1 IMPLANT
SPONGE GAUZE 2X2 STER 10/PKG (GAUZE/BANDAGES/DRESSINGS) ×1
SPONGE T-LAP 18X18 ~~LOC~~+RFID (SPONGE) ×1 IMPLANT
STENT GRAFT BALLN CATH 65CM (STENTS) ×4
STENT GRAFT CONTRALAT 16X12X14 (Vascular Products) IMPLANT
STOPCOCK MORSE 400PSI 3WAY (MISCELLANEOUS) ×4 IMPLANT
SUT ETHILON 3 0 PS 1 (SUTURE) IMPLANT
SUT MNCRL AB 4-0 PS2 18 (SUTURE) ×6 IMPLANT
SUT PROLENE 5 0 C 1 24 (SUTURE) IMPLANT
SUT VIC AB 2-0 CTX 36 (SUTURE) IMPLANT
SUT VIC AB 3-0 SH 27 (SUTURE)
SUT VIC AB 3-0 SH 27X BRD (SUTURE) IMPLANT
SYR 20ML LL LF (SYRINGE) ×3 IMPLANT
SYR MEDRAD MARK V 150ML (SYRINGE) ×1 IMPLANT
TOWEL GREEN STERILE (TOWEL DISPOSABLE) ×3 IMPLANT
TRAY FOLEY MTR SLVR 16FR STAT (SET/KITS/TRAYS/PACK) ×3 IMPLANT
TUBING HIGH PRESSURE 120CM (CONNECTOR) ×3 IMPLANT
TUBING INJECTOR 48 (MISCELLANEOUS) ×1 IMPLANT
WIRE AMPLATZ SS-J .035X180CM (WIRE) ×2 IMPLANT
WIRE BENTSON .035X145CM (WIRE) ×1 IMPLANT
WIRE STARTER BENTSON 035X150 (WIRE) ×1 IMPLANT

## 2021-02-26 NOTE — H&P (Signed)
History and Physical Interval Note:  02/26/2021 7:30 AM  Fernando Dean  has presented today for surgery, with the diagnosis of AAA.  The various methods of treatment have been discussed with the patient and family. After consideration of risks, benefits and other options for treatment, the patient has consented to  Procedure(s): ABDOMINAL AORTIC ENDOVASCULAR STENT GRAFT REPAIR (N/A) as a surgical intervention.  The patient's history has been reviewed, patient examined, no change in status, stable for surgery.  I have reviewed the patient's chart and labs.  Questions were answered to the patient's satisfaction.    Stent graft repair of AAA.  Risks and benefits again discussed.  Marty Heck  Patient name: Fernando Dean       MRN: [id number]        DOB: 02-02-1951          Sex: male   REASON FOR CONSULT: Evaluate abdominal aortic aneurysm   HPI: Fernando Dean is a 71 y.o. male, with history of hypertension, CKD, tobacco abuse, and recent diagnosis of third-degree heart block that presents for evaluation of abdominal aortic aneurysm.  Patient's aneurysm was discovered on recent CT from 02/04/2021 documenting a 5.8 cm abdominal aortic aneurysm.  Patient states he has no prior knowledge of this aneurysm.  He denies any abdominal or back pain today.  States he does have some chronic intermittent back pain but this is not bothering him today.  He thinks his mom had an aneurysm.  He does smoke about a pack a day.  Recently had a pacemaker inserted on his hospital admission for bradycardia/complete heart block.       Past Medical History:  Diagnosis Date   Heme positive stool     Hypertension             Past Surgical History:  Procedure Laterality Date   HERNIA REPAIR        2012   PACEMAKER IMPLANT N/A 02/08/2021    Procedure: PACEMAKER IMPLANT;  Surgeon: Deboraha Sprang, MD;  Location: [place] [facility];  Service: Cardiovascular;  Laterality: N/A;           Family History   Problem Relation Age of Onset   Bladder Cancer Mother     Heart disease Father        SOCIAL HISTORY: Social History         Socioeconomic History   Marital status: Single      Spouse name: Not on file   Number of children: Not on file   Years of education: Not on file   Highest education level: Not on file  Occupational History   Not on file  Tobacco Use   Smoking status: Every Day      Packs/day: 1.00      Types: Cigarettes   Smokeless tobacco: Never  Vaping Use   Vaping Use: Never used  Substance and Sexual Activity   Alcohol use: Yes      Comment: daily liquor moderatly   Drug use: Not on file   Sexual activity: Not on file  Other Topics Concern   Not on file  Social History Narrative   Not on file    Social Determinants of Health    Financial Resource Strain: Not on file  Food Insecurity: Not on file  Transportation Needs: Not on file  Physical Activity: Not on file  Stress: Not on file  Social Connections: Not on file  Intimate Partner Violence: Not on file  Allergies  Allergen Reactions   Advil [Ibuprofen] Shortness Of Breath and Palpitations   Aleve [Naproxen] Shortness Of Breath   Nsaids Shortness Of Breath            Current Outpatient Medications  Medication Sig Dispense Refill   amLODipine (NORVASC) 10 MG tablet Take 1 tablet (10 mg total) by mouth daily. 30 tablet 3   carvedilol (COREG) 6.25 MG tablet Take 1 tablet (6.25 mg total) by mouth 2 (two) times daily with a meal. 60 tablet 3   Multiple Vitamins-Minerals (MULTIVITAMIN WITH MINERALS) tablet Take 1 tablet by mouth daily.        No current facility-administered medications for this visit.      REVIEW OF SYSTEMS:  [X]  denotes positive finding, [ ]  denotes negative finding Cardiac   Comments:  Chest pain or chest pressure:      Shortness of breath upon exertion:      Short of breath when lying flat:      Irregular heart rhythm:             Vascular      Pain in calf,  thigh, or hip brought on by ambulation:      Pain in feet at night that wakes you up from your sleep:       Blood clot in your veins:      Leg swelling:              Pulmonary      Oxygen at home:      Productive cough:       Wheezing:              Neurologic      Sudden weakness in arms or legs:       Sudden numbness in arms or legs:       Sudden onset of difficulty speaking or slurred speech:      Temporary loss of vision in one eye:       Problems with dizziness:              Gastrointestinal      Blood in stool:       Vomited blood:              Genitourinary      Burning when urinating:       Blood in urine:             Psychiatric      Major depression:              Hematologic      Bleeding problems:      Problems with blood clotting too easily:             Skin      Rashes or ulcers:             Constitutional      Fever or chills:          PHYSICAL EXAM:    Vitals:    02/16/21 1036  BP: (!) 149/89  Pulse: 60  Resp: 16  Temp: (!) 97.5 F (36.4 C)  TempSrc: Temporal  SpO2: (!) 3%  Weight: 163 lb (73.9 kg)  Height: 6' (1.829 m)      GENERAL: The patient is a well-nourished male, in no acute distress. The vital signs are documented above. CARDIAC: There is a regular rate and rhythm.  VASCULAR:  Palpable femoral pulses bilaterally Palpable PT pulses bilaterally PULMONARY: No respiratory  distress. ABDOMEN: Soft and non-tender.  No pain with palpation of aneurysm. MUSCULOSKELETAL: There are no major deformities or cyanosis. NEUROLOGIC: No focal weakness or paresthesias are detected. SKIN: There are no ulcers or rashes noted. PSYCHIATRIC: The patient has a normal affect.   DATA:    CTA abdomen pelvis reviewed from 02/04/2021 with 5.8 cm abdominal aortic aneurysm.  This does have a reversed taper neck.  Access looks adequate.   Assessment/Plan:   71 year old male recently admitted with third-degree heart block requiring pacemaker found to have  5.8 cm abdominal aortic aneurysm.  I discussed after review of CT scan I would recommend repair with stent graft.  He does have a reverse taper but I think we can get seal with the new Gore conformable device.  I measured the neck at just about 15 mm.  His aneurysm is asymptomatic and no pain with palpation.  Discussed current guidelines are to repair these at greater than 5.5 cm.  I discussed bilateral transfemoral access in the OR under anesthesia.  Discussed risk and benefits including risk of bleeding, infection, MI, stroke, renal dysfunction, renal failure, bowel ischemia etc.  He wishes to proceed.  We will get him scheduled for next week.  All questions answered.     Marty Heck, MD [facility] Office: [phone]

## 2021-02-26 NOTE — Op Note (Addendum)
Date: February 26, 2021  Preoperative diagnosis: 5.8 cm abdominal aortic aneurysm  Postoperative diagnosis: Same  Procedure: 1.  Ultrasound-guided access bilateral common femoral artery for delivery of endograft including percutaneous closure 2.  Repair of abdominal aortic aneurysm with aortobiiliac endovascular stent graft   Surgeon: Dr. Marty Heck, MD  Assistant: Arlee Muslim, PA  Indications: Patient is a 71 year old male seen in the office with a 5.8 cm abdominal aortic aneurysm.  He does have a reverse taper of the neck but the neck is 15 mm in length.  We have subsequently recommended stent graft repair of his abdominal aortic aneurysm using aortobiiliac stent graft.  He presents today after risk benefits discussed.  An assistant was needed for exposure and to expedite the case.  Findings: Ultrasound-guided access of bilateral common femoral arteries with delivery of Perclose devices.  An 53 French Gore dry seal sheath was placed in the right common femoral artery and a 12 French Gore dry seal sheath was placed in the left common femoral artery.  Main body device was deployed on the right side and was a 32 mm x 14 mm x 14 cm deployed just below the right renal.  We placed a bell bottom into the left common iliac artery it was 12 mm x 14 cm.  We placed a 16 mm x 9.5 cm bell bottom into the right common iliac artery.  Both hypogastric arteries were preserved.  All proximal and distal landing zones and overlapping components were treated with MOB balloon.  At completion there was widely patent endograft with excellent seal proximally distally.  There was a very delayed type II endoleak.  Anesthesia: General  Details: Patient was taken to the operating room after informed consent was obtained.  Placed on operative table in supine position.  General endotracheal anesthesia was induced.  Bilateral groins and abdomen were then prepped and draped in standard sterile fashion.  Timeout was  performed.  Antibiotics were given.  Initially evaluated the right common femoral artery with ultrasound it was patent and image was saved.  This was then accessed with a micro access needle and placed a microwire and then I made a stab incision around this and dissected down with a hemostat.  We then placed a micro sheath and then a Bentson wire and then dilated with an 8 [place] dilator.  Perclose devices were deployed at 10:00 and 2:00 in the right common femoral artery.  An 8 French sheath was placed in the right common femoral artery.  Exact same steps were then repeated in the left common femoral artery again evaluating this with ultrasound to ensure it was patent and then placing Perclose device at 10:00 and 2:00 after accessing it with a micro access wire.  That point time we placed Bentson wires up into the suprarenal aorta from both groins and used KMP catheter to exchanged for Amplatz wires in both common femoral arteries that were placed into the thoracic aorta.  Patient was given 100 units/kg IV heparin.  ACT's were checked to maintain greater than 250.  I then placed a 18 [place] Gore dry seal sheath in the right common femoral artery and a 12 French Gore dry seal in the left common femoral artery and both sheaths were advanced into the abdominal aorta.  We then placed the main body device up the right sheath which was a 32 mm x 14 mm x 14 cm placed up to the level of L2 with a Omni Flush catheter placed  up the left groin.  Abdominal aortogram was obtained that identified the lowest right renal artery and then the main body of the device was deployed initially at the left renal allowing the device to fall down onto the right renal which was the lower renal.  I did have to constrain the device and bring it down slightly in order to ensure that I did not cover the right renal and then the device was properly positioned just below the right renal.  I then came from the left groin sheath with a Vanche 5  catheter and a Bentson wire and I was able to cannulate the gate.  We put a Omni Flush catheter up and spun in the main body of the graft to ensure we were in the main body the graft and this was confirmed.  I then used a marker Omni Flush catheter with a retrograde sheath shot on the left to identify the left hypogastric.  We then deployed a 12 mm x 14 cm from the main body device down to the left common iliac artery preserving the hypogastric.  I then came and finished deploying the right limb of the main body and then we placed a 16 mm x 9.5 cm bell bottom from the main body down to the right common iliac preserving the hypogastric after getting a retrograde sheath shot.  I then used my MOB balloon and all proximal and distal landing zones and overlapping components were treated with balloon angioplasty.  Final aortogram showed patent renal arteries bilaterally with a widely patent endograft and good seal proximally distally.  There did appear to be a very delayed type II endoleak.  I also got retrograde sheath shots from both groins to ensure the iliac arteries did not have any evidence of dissection or other injury and these were both widely patent.  We then removed the 12 French sheath in the left groin holding  manual pressure over a Bentson wire and tied down the Perclose devices at 10:00 and 2:00 with hemostasis and then the wire was removed.  We then did the same steps in the right groin holding manual pressure and removing 18 French sheath and tied down the Perclose at 10:00 and 2:00 with good hemostasis and the wire was removed.  He had palpable femoral pulses at completion.  PT pulses were brisk by Doppler.  Protamine was given for reversal.  I then cut the sutures in both groins and 4-0 Monocryl subcuticular stitches were placed with Dermabond.  Taken to recovery in stable condition.  Complication: None  Condition: Stable  Marty Heck, MD [facility]  [place] Office: [place]

## 2021-02-26 NOTE — Progress Notes (Signed)
Pt arrived to [facility] from PACU. Pt oriented to room and staff. Vitals obtained. Bilateral groin incisions clean, dry, and intact with skin glue. Palpable pedal and PT pulses bilaterally. Pt denies pain at this time. Aline zeroed.

## 2021-02-26 NOTE — Transfer of Care (Signed)
Immediate Anesthesia Transfer of Care Note  Patient: Fernando Dean  Procedure(s) Performed: ABDOMINAL AORTIC ENDOVASCULAR STENT GRAFT REPAIR (Bilateral: Abdomen)  Patient Location: PACU  Anesthesia Type:General  Level of Consciousness: alert , oriented and drowsy  Airway & Oxygen Therapy: Patient Spontanous Breathing and Patient connected to nasal cannula oxygen  Post-op Assessment: Report given to RN and Post -op Vital signs reviewed and stable  Post vital signs: Reviewed and stable  Last Vitals:  Vitals Value Taken Time  BP 139/74 02/26/21 1000  Temp 36.2 C 02/26/21 1000  Pulse 60 02/26/21 1008  Resp 18 02/26/21 1009  SpO2 95 % 02/26/21 1009  Vitals shown include unvalidated device data.  Last Pain:  Vitals:   02/26/21 1000  PainSc: 2          Complications: No notable events documented.

## 2021-02-26 NOTE — Anesthesia Procedure Notes (Signed)
Procedure Name: Intubation Date/Time: 02/26/2021 7:49 AM Performed by: Imagene Riches, CRNA Pre-anesthesia Checklist: Patient identified, Emergency Drugs available, Suction available and Patient being monitored Patient Re-evaluated:Patient Re-evaluated prior to induction Oxygen Delivery Method: Circle System Utilized Preoxygenation: Pre-oxygenation with 100% oxygen Induction Type: IV induction Ventilation: Mask ventilation without difficulty Laryngoscope Size: Miller and 2 Grade View: Grade I Tube type: Oral Tube size: 7.5 mm Number of attempts: 1 Airway Equipment and Method: Stylet and Oral airway Placement Confirmation: ETT inserted through vocal cords under direct vision, positive ETCO2 and breath sounds checked- equal and bilateral Secured at: 22 cm Tube secured with: Tape Dental Injury: Teeth and Oropharynx as per pre-operative assessment

## 2021-02-26 NOTE — Anesthesia Procedure Notes (Signed)
Arterial Line Insertion Start/End1/07/2021 7:00 AM, 02/26/2021 7:00 AM Performed by: Imagene Riches, CRNA, CRNA  Patient location: Pre-op. Preanesthetic checklist: patient identified, IV checked, site marked, risks and benefits discussed, surgical consent, monitors and equipment checked, pre-op evaluation, timeout performed and anesthesia consent Right, radial was placed Catheter size: 20 G Hand hygiene performed , maximum sterile barriers used  and Seldinger technique used  Procedure performed without using ultrasound guided technique.

## 2021-02-27 LAB — CBC
HCT: 34.2 % — ABNORMAL LOW (ref 39.0–52.0)
Hemoglobin: 10.8 g/dL — ABNORMAL LOW (ref 13.0–17.0)
MCH: 25.9 pg — ABNORMAL LOW (ref 26.0–34.0)
MCHC: 31.6 g/dL (ref 30.0–36.0)
MCV: 82 fL (ref 80.0–100.0)
Platelets: 251 10*3/uL (ref 150–400)
RBC: 4.17 MIL/uL — ABNORMAL LOW (ref 4.22–5.81)
RDW: 17.4 % — ABNORMAL HIGH (ref 11.5–15.5)
WBC: 10.6 10*3/uL — ABNORMAL HIGH (ref 4.0–10.5)
nRBC: 0 % (ref 0.0–0.2)

## 2021-02-27 LAB — BASIC METABOLIC PANEL
Anion gap: 7 (ref 5–15)
BUN: 26 mg/dL — ABNORMAL HIGH (ref 8–23)
CO2: 21 mmol/L — ABNORMAL LOW (ref 22–32)
Calcium: 9.3 mg/dL (ref 8.9–10.3)
Chloride: 107 mmol/L (ref 98–111)
Creatinine, Ser: 1.62 mg/dL — ABNORMAL HIGH (ref 0.61–1.24)
GFR, Estimated: 45 mL/min — ABNORMAL LOW (ref >60)
Glucose, Bld: 137 mg/dL — ABNORMAL HIGH (ref 70–99)
Potassium: 5 mmol/L (ref 3.5–5.1)
Sodium: 135 mmol/L (ref 135–145)

## 2021-02-27 MED ORDER — ASPIRIN 81 MG PO TBEC: 81.0000 mg | DELAYED_RELEASE_TABLET | Freq: Every day | ORAL | 11 refills | Status: AC

## 2021-02-27 MED ORDER — OXYCODONE-ACETAMINOPHEN 5-325 MG PO TABS: 1.0000 | ORAL_TABLET | Freq: Four times a day (QID) | ORAL | 0 refills | Status: DC | PRN

## 2021-02-27 MED ORDER — SIMVASTATIN 10 MG PO TABS: 10.0000 mg | ORAL_TABLET | Freq: Every day | ORAL | 2 refills | Status: DC

## 2021-02-27 NOTE — Anesthesia Postprocedure Evaluation (Signed)
Anesthesia Post Note  Patient: Fernando Dean  Procedure(s) Performed: ABDOMINAL AORTIC ENDOVASCULAR STENT GRAFT REPAIR (Bilateral: Abdomen)     Patient location during evaluation: PACU Anesthesia Type: General Level of consciousness: awake and alert Pain management: pain level controlled Vital Signs Assessment: post-procedure vital signs reviewed and stable Respiratory status: spontaneous breathing, nonlabored ventilation, respiratory function stable and patient connected to nasal cannula oxygen Cardiovascular status: blood pressure returned to baseline and stable Postop Assessment: no apparent nausea or vomiting Anesthetic complications: no   No notable events documented.  Last Vitals:  Vitals:   02/27/21 0100 02/27/21 0345  BP: (!) 142/86 (!) 147/74  Pulse: 79 70  Resp: 17 17  Temp:  36.7 C  SpO2: 97% 99%    Last Pain:  Vitals:   02/27/21 0612  TempSrc:   PainSc: [place]

## 2021-02-27 NOTE — Progress Notes (Signed)
Patient discharging home with daughter. Tele removed and CCMD notified. IVs removed without complications. Discharge instructions given and medication administration discussed. All questions answers. Right and left groin sites level 0. No pain at this time.  [place] N Jia Dottavio

## 2021-02-27 NOTE — TOC Transition Note (Signed)
Transition of Care [facility]) - CM/SW Discharge Note   Patient Details  Name: Fernando Dean MRN: [id number] Date of Birth: [birth year]  [facility] CM/SW Contact:  Konrad Penta, RN Phone Number: [phone] 02/27/2021, 9:24 AM   Clinical Narrative:   Spoke with Mr. [name] prior to transition home today. States he lives with his daughter. Denies having any needs. Reports he did not require DME prior to admission and anticipates no issues.   No identifiable TOC needs at this time. Please advise if needs change.     Final next level of care: Home/Self Care Barriers to Discharge: No Barriers Identified   Patient Goals and CMS Choice Patient states their goals for this hospitalization and ongoing recovery are:: return home      Discharge Placement                       Discharge Plan and Services                                   Social Determinants of Health (SDOH) Interventions     Readmission Risk Interventions No flowsheet data found.

## 2021-02-27 NOTE — Discharge Instructions (Signed)
   [facility] [place]   Discharge Instructions  Endovascular Aortic Aneurysm Repair  Please refer to the following instructions for your post-procedure care. Your surgeon or Physician Assistant will discuss any changes with you.  Activity  You are encouraged to walk as much as you can. You can slowly return to normal activities but must avoid strenuous activity and heavy lifting until your doctor tells you it's OK. Avoid activities such as vacuuming or swinging a gold club. It is normal to feel tired for several weeks after your surgery. Do not drive until your doctor gives the OK and you are no longer taking prescription pain medications. It is also normal to have difficulty with sleep habits, eating, and bowel movements after surgery. These will go away with time.  Bathing/Showering  You may shower after you go home. If you have an incision, do not soak in a bathtub, hot tub, or swim until the incision heals completely.  Incision Care  Shower every day. Clean your incision with mild soap and water. Pat the area dry with a clean towel. You do not need a bandage unless otherwise instructed. Do not apply any ointments or creams to your incision. If you clothing is irritating, you may cover your incision with a dry gauze pad.  Diet  Resume your normal diet. There are no special food restrictions following this procedure. A low fat/low cholesterol diet is recommended for all patients with vascular disease. In order to heal from your surgery, it is CRITICAL to get adequate nutrition. Your body requires vitamins, minerals, and protein. Vegetables are the best source of vitamins and minerals. Vegetables also provide the perfect balance of protein. Processed food has little nutritional value, so try to avoid this.  Medications  Resume taking all of your medications unless your doctor or nurse practitioner tells you not to. If your incision is causing pain, you may take  over-the-counter pain relievers such as acetaminophen (Tylenol). If you were prescribed a stronger pain medication, please be aware these medications can cause nausea and constipation. Prevent nausea by taking the medication with a snack or meal. Avoid constipation by drinking plenty of fluids and eating foods with a high amount of fiber, such as fruits, vegetables, and grains. Do not take Tylenol if you are taking prescription pain medications.   Follow up  Our office will schedule a follow-up appointment with a C.T. scan 3-4 weeks after your surgery.  Please call us immediately for any of the following conditions  Severe or worsening pain in your legs or feet or in your abdomen back or chest. Increased pain, redness, drainage (pus) from your incision sit. Increased abdominal pain, bloating, nausea, vomiting or persistent diarrhea. Fever of 101 degrees or higher. Swelling in your leg (s),  Reduce your risk of vascular disease  Stop smoking. If you would like help call [facility] at 1-800-QUIT-NOW (1-800-784-8669) or [place] at 336-586-4000. Manage your cholesterol Maintain a desired weight Control your diabetes Keep your blood pressure down  If you have questions, please call the office at [phone].   

## 2021-02-27 NOTE — Progress Notes (Addendum)
Progress Note    02/27/2021 8:55 AM 1 Day Post-Op  Subjective:  No complaints   Vitals:   02/27/21 0345 02/27/21 0841  BP: (!) 147/74 (!) 147/96  Pulse: 70 61  Resp: 17 16  Temp: 98 F (36.7 C) (!) 97.5 F (36.4 C)  SpO2: 99% 99%   Physical Exam: Lungs:  non labored Incisions:  B groin incisions c/d/I without hematoma Extremities:  symmetrical PT pulses Abdomen:  soft, NT Neurologic: A&O  CBC    Component Value Date/Time   WBC 10.6 (H) 02/27/2021 0118   RBC 4.17 (L) 02/27/2021 0118   HGB 10.8 (L) 02/27/2021 0118   HCT 34.2 (L) 02/27/2021 0118   PLT 251 02/27/2021 0118   MCV 82.0 02/27/2021 0118   MCH 25.9 (L) 02/27/2021 0118   MCHC 31.6 02/27/2021 0118   RDW 17.4 (H) 02/27/2021 0118   LYMPHSABS 2.1 02/08/2021 0236   MONOABS 0.8 02/08/2021 0236   EOSABS 0.6 (H) 02/08/2021 0236   BASOSABS 0.1 02/08/2021 0236    BMET    Component Value Date/Time   NA 135 02/27/2021 0118   K 5.0 02/27/2021 0118   CL 107 02/27/2021 0118   CO2 21 (L) 02/27/2021 0118   GLUCOSE 137 (H) 02/27/2021 0118   BUN 26 (H) 02/27/2021 0118   CREATININE 1.62 (H) 02/27/2021 0118   CALCIUM 9.3 02/27/2021 0118   GFRNONAA 45 (L) 02/27/2021 0118   GFRAA  10/23/2006 2125    >60        The eGFR has been calculated using the MDRD equation. This calculation has not been validated in all clinical    INR    Component Value Date/Time   INR 1.0 02/26/2021 1001     Intake/Output Summary (Last 24 hours) at 02/27/2021 0855 Last data filed at 02/27/2021 7425 Gross per 24 hour  Intake 1887.2 ml  Output 1950 ml  Net -62.8 ml     Assessment/Plan:  71 y.o. male is s/p EVAR 1 Day Post-Op   BLE well perfused with symmetrical PT pulses B groin incisions unremarkable Ok for d/c home today if ambulating well Office will arrange CT in 1 month to evaluate repair   Dagoberto Ligas, PA-C [facility] [phone] 02/27/2021 8:55 AM  VASCULAR STAFF ADDENDUM: I have  independently interviewed and examined the patient. I agree with the above.    Cassandria Santee, MD [facility] Phone Number: [phone] 02/27/2021 10:49 AM

## 2021-02-28 NOTE — Discharge Summary (Signed)
°  Discharge Summary  Patient ID: Fernando Dean [id number] 71 y.o. 07/08/1950  Admit date: 02/26/2021  Discharge date and time: 02/27/2021  1:12 PM   Admitting Physician: Marty Heck, MD   Discharge Physician: same  Admission Diagnoses: S/P AAA repair [phone], Z86.79]  Discharge Diagnoses: same  Admission Condition: fair  Discharged Condition: good  Indication for Admission: Repair of AAA  Hospital Course: [name] is a 71 year old male who was brought in as an outpatient and underwent endovascular repair of abdominal aortic aneurysm by Dr. Carlis Abbott on 02/26/2021.  He tolerated the procedure well and was admitted to the hospital postoperatively.  POD #1 he maintained palpable pulses in bilateral lower extremities.  Bilateral groin incisions were healing well without hematoma.  He did not have any abdominal or back pain.  Creatinine remained at baseline postoperatively.  He will follow-up with Dr. Carlis Abbott in 1 month with CTA abdomen and pelvis.  He was discharged home in stable condition.  Consults: None  Treatments: surgery: EVAR by Dr. Carlis Abbott on 02/26/2021  Discharge Exam: See progress note 1/7 Vitals:   02/27/21 0345 02/27/21 0841  BP: (!) 147/74 (!) 147/96  Pulse: 70 61  Resp: 17 16  Temp: 98 F (36.7 C) (!) 97.5 F (36.4 C)  SpO2: 99% 99%     Disposition: Discharge disposition: 01-Home or Self Care       Patient Instructions:  Allergies as of 02/27/2021       Reactions   Advil [ibuprofen] Shortness Of Breath, Palpitations   Aleve [naproxen] Shortness Of Breath   Nsaids Shortness Of Breath        Medication List     TAKE these medications    amLODipine 10 MG tablet Commonly known as: NORVASC Take 1 tablet (10 mg total) by mouth daily.   aspirin 81 MG EC tablet Take 1 tablet (81 mg total) by mouth daily at 6 (six) AM. Swallow whole.   carvedilol 6.25 MG tablet Commonly known as: COREG Take 1 tablet (6.25 mg total) by mouth 2 (two)  times daily with a meal.   multivitamin with minerals tablet Take 1 tablet by mouth daily.   oxyCODONE-acetaminophen 5-325 MG tablet Commonly known as: PERCOCET/ROXICET Take 1 tablet by mouth every 6 (six) hours as needed for moderate pain.   simvastatin 10 MG tablet Commonly known as: ZOCOR Take 1 tablet (10 mg total) by mouth daily at 6 PM.       Activity: activity as tolerated Diet: regular diet Wound Care: none needed  Follow-up with Dr. Carlis Abbott in 1 month.  Signed: Dagoberto Ligas, PA-C 02/28/2021 9:16 AM [facility] Office: [phone]

## 2021-03-01 ENCOUNTER — Encounter (HOSPITAL_COMMUNITY): Payer: Self-pay | Admitting: Vascular Surgery

## 2021-03-03 ENCOUNTER — Encounter (INDEPENDENT_AMBULATORY_CARE_PROVIDER_SITE_OTHER): Payer: Self-pay

## 2021-03-03 ENCOUNTER — Other Ambulatory Visit (INDEPENDENT_AMBULATORY_CARE_PROVIDER_SITE_OTHER): Payer: Self-pay

## 2021-03-03 DIAGNOSIS — E875 Hyperkalemia: Secondary | ICD-10-CM | POA: Diagnosis not present

## 2021-03-03 DIAGNOSIS — R195 Other fecal abnormalities: Secondary | ICD-10-CM

## 2021-03-03 LAB — BPAM RBC
Blood Product Expiration Date: 202301252359
Blood Product Expiration Date: 202301252359
Unit Type and Rh: 5100
Unit Type and Rh: 5100

## 2021-03-03 LAB — TYPE AND SCREEN
ABO/RH(D): O POS
Antibody Screen: NEGATIVE
Unit division: 0
Unit division: 0

## 2021-03-05 NOTE — Patient Instructions (Addendum)
Fernando Dean  03/05/2021     @PREFPERIOPPHARMACY @   Your procedure is scheduled on  03/12/2021.   Report to [facility] at  [place]   Call this number if you have problems the morning of surgery:  [phone]   Remember:  Follow the diet and prep instructions given to you by the office.    Take these medicines the morning of surgery with A SIP OF WATER               amlodipine, carvedilol, oxycodone (if needed).     Do not wear jewelry, make-up or nail polish.  Do not wear lotions, powders, or perfumes, or deodorant.  Do not shave 48 hours prior to surgery.  Men may shave face and neck.  Do not bring valuables to the hospital.  [facility] is not responsible for any belongings or valuables.  Contacts, dentures or bridgework may not be worn into surgery.  Leave your suitcase in the car.  After surgery it may be brought to your room.  For patients admitted to the hospital, discharge time will be determined by your treatment team.  Patients discharged the day of surgery will not be allowed to drive home and must have someone with them for 24 hours.    Special instructions:   DO NOT smoke tobacco or vape for 24 hours before your procedure.  Please read over the following fact sheets that you were given. Anesthesia Post-op Instructions and Care and Recovery After Surgery       Colonoscopy, Adult, Care After This sheet gives you information about how to care for yourself after your procedure. Your health care provider may also give you more specific instructions. If you have problems or questions, contact your health care provider. What can I expect after the procedure? After the procedure, it is common to have: A small amount of blood in your stool for 24 hours after the procedure. Some gas. Mild cramping or bloating of your abdomen. Follow these instructions at home: Eating and drinking  Drink enough fluid to keep your urine pale yellow. Follow  instructions from your health care provider about eating or drinking restrictions. Resume your normal diet as instructed by your health care provider. Avoid heavy or fried foods that are hard to digest. Activity Rest as told by your health care provider. Avoid sitting for a long time without moving. Get up to take short walks every 1-2 hours. This is important to improve blood flow and breathing. Ask for help if you feel weak or unsteady. Return to your normal activities as told by your health care provider. Ask your health care provider what activities are safe for you. Managing cramping and bloating  Try walking around when you have cramps or feel bloated. Apply heat to your abdomen as told by your health care provider. Use the heat source that your health care provider recommends, such as a moist heat pack or a heating pad. Place a towel between your skin and the heat source. Leave the heat on for 20-30 minutes. Remove the heat if your skin turns bright red. This is especially important if you are unable to feel pain, heat, or cold. You may have a greater risk of getting burned. General instructions If you were given a sedative during the procedure, it can affect you for several hours. Do not drive or operate machinery until your health care provider says that it is safe. For the  first 24 hours after the procedure: Do not sign important documents. Do not drink alcohol. Do your regular daily activities at a slower pace than normal. Eat soft foods that are easy to digest. Take over-the-counter and prescription medicines only as told by your health care provider. Keep all follow-up visits as told by your health care provider. This is important. Contact a health care provider if: You have blood in your stool 2-3 days after the procedure. Get help right away if you have: More than a small spotting of blood in your stool. Large blood clots in your stool. Swelling of your abdomen. Nausea or  vomiting. A fever. Increasing pain in your abdomen that is not relieved with medicine. Summary After the procedure, it is common to have a small amount of blood in your stool. You may also have mild cramping and bloating of your abdomen. If you were given a sedative during the procedure, it can affect you for several hours. Do not drive or operate machinery until your health care provider says that it is safe. Get help right away if you have a lot of blood in your stool, nausea or vomiting, a fever, or increased pain in your abdomen. This information is not intended to replace advice given to you by your health care provider. Make sure you discuss any questions you have with your health care provider. Document Revised: 12/14/2018 Document Reviewed: 09/03/2018 Elsevier Patient Education  [place] After This sheet gives you information about how to care for yourself after your procedure. Your health care provider may also give you more specific instructions. If you have problems or questions, contact your health care provider. What can I expect after the procedure? After the procedure, it is common to have: Tiredness. Forgetfulness about what happened after the procedure. Impaired judgment for important decisions. Nausea or vomiting. Some difficulty with balance. Follow these instructions at home: For the time period you were told by your health care provider:   Rest as needed. Do not participate in activities where you could fall or become injured. Do not drive or use machinery. Do not drink alcohol. Do not take sleeping pills or medicines that cause drowsiness. Do not make important decisions or sign legal documents. Do not take care of children on your own. Eating and drinking Follow the diet that is recommended by your health care provider. Drink enough fluid to keep your urine pale yellow. If you vomit: Drink water, juice, or soup when  you can drink without vomiting. Make sure you have little or no nausea before eating solid foods. General instructions Have a responsible adult stay with you for the time you are told. It is important to have someone help care for you until you are awake and alert. Take over-the-counter and prescription medicines only as told by your health care provider. If you have sleep apnea, surgery and certain medicines can increase your risk for breathing problems. Follow instructions from your health care provider about wearing your sleep device: Anytime you are sleeping, including during daytime naps. While taking prescription pain medicines, sleeping medicines, or medicines that make you drowsy. Avoid smoking. Keep all follow-up visits as told by your health care provider. This is important. Contact a health care provider if: You keep feeling nauseous or you keep vomiting. You feel light-headed. You are still sleepy or having trouble with balance after 24 hours. You develop a rash. You have a fever. You have redness or swelling around the  IV site. Get help right away if: You have trouble breathing. You have new-onset confusion at home. Summary For several hours after your procedure, you may feel tired. You may also be forgetful and have poor judgment. Have a responsible adult stay with you for the time you are told. It is important to have someone help care for you until you are awake and alert. Rest as told. Do not drive or operate machinery. Do not drink alcohol or take sleeping pills. Get help right away if you have trouble breathing, or if you suddenly become confused. This information is not intended to replace advice given to you by your health care provider. Make sure you discuss any questions you have with your health care provider. Document Revised: 10/24/2019 Document Reviewed: 01/10/2019 Elsevier Patient Education  2022 [employer].

## 2021-03-08 ENCOUNTER — Other Ambulatory Visit: Payer: Self-pay

## 2021-03-08 DIAGNOSIS — I7143 Infrarenal abdominal aortic aneurysm, without rupture: Secondary | ICD-10-CM

## 2021-03-09 ENCOUNTER — Other Ambulatory Visit: Payer: Self-pay

## 2021-03-09 ENCOUNTER — Encounter (HOSPITAL_COMMUNITY): Payer: Self-pay

## 2021-03-09 ENCOUNTER — Encounter (HOSPITAL_COMMUNITY)
Admission: RE | Admit: 2021-03-09 | Discharge: 2021-03-09 | Disposition: A | Discharge status: Home / Self Care | Payer: Medicare Other | Source: Ambulatory Visit | Attending: Gastroenterology | Admitting: Gastroenterology

## 2021-03-09 DIAGNOSIS — R195 Other fecal abnormalities: Secondary | ICD-10-CM | POA: Diagnosis not present

## 2021-03-09 DIAGNOSIS — Z01812 Encounter for preprocedural laboratory examination: Secondary | ICD-10-CM | POA: Diagnosis not present

## 2021-03-09 DIAGNOSIS — N183 Chronic kidney disease, stage 3 unspecified: Secondary | ICD-10-CM | POA: Diagnosis not present

## 2021-03-09 LAB — CBC WITH DIFFERENTIAL/PLATELET
Abs Immature Granulocytes: 0.02 10*3/uL (ref 0.00–0.07)
Basophils Absolute: 0.1 10*3/uL (ref 0.0–0.1)
Basophils Relative: 1 %
Eosinophils Absolute: 0.4 10*3/uL (ref 0.0–0.5)
Eosinophils Relative: 5 %
HCT: 35.9 % — ABNORMAL LOW (ref 39.0–52.0)
Hemoglobin: 10.8 g/dL — ABNORMAL LOW (ref 13.0–17.0)
Immature Granulocytes: 0 %
Lymphocytes Relative: 28 %
Lymphs Abs: 2.4 10*3/uL (ref 0.7–4.0)
MCH: 25.4 pg — ABNORMAL LOW (ref 26.0–34.0)
MCHC: 30.1 g/dL (ref 30.0–36.0)
MCV: 84.5 fL (ref 80.0–100.0)
Monocytes Absolute: 1 10*3/uL (ref 0.1–1.0)
Monocytes Relative: 11 %
Neutro Abs: 4.8 10*3/uL (ref 1.7–7.7)
Neutrophils Relative %: 55 %
Platelets: 249 10*3/uL (ref 150–400)
RBC: 4.25 MIL/uL (ref 4.22–5.81)
RDW: 18 % — ABNORMAL HIGH (ref 11.5–15.5)
WBC: 8.7 10*3/uL (ref 4.0–10.5)
nRBC: 0 % (ref 0.0–0.2)

## 2021-03-11 ENCOUNTER — Telehealth: Payer: Self-pay

## 2021-03-11 NOTE — Telephone Encounter (Signed)
Spoke with patient's daughter. I advised that her work form is completed, placed in front office & ready for pick up. Fernando Dean stated she will be by tomorrow to pick up the form.

## 2021-03-12 ENCOUNTER — Ambulatory Visit (HOSPITAL_COMMUNITY): Payer: Medicare Other | Admitting: Anesthesiology

## 2021-03-12 ENCOUNTER — Ambulatory Visit (HOSPITAL_COMMUNITY)
Admission: RE | Admit: 2021-03-12 | Discharge: 2021-03-12 | Disposition: A | Discharge status: Home / Self Care | Payer: Medicare Other | Attending: Gastroenterology | Admitting: Gastroenterology

## 2021-03-12 ENCOUNTER — Encounter (HOSPITAL_COMMUNITY): Payer: Self-pay | Admitting: Gastroenterology

## 2021-03-12 ENCOUNTER — Encounter (HOSPITAL_COMMUNITY)
Admission: RE | Disposition: A | Discharge status: Home / Self Care | Payer: Self-pay | Source: Home / Self Care | Attending: Gastroenterology

## 2021-03-12 DIAGNOSIS — K6389 Other specified diseases of intestine: Secondary | ICD-10-CM | POA: Diagnosis not present

## 2021-03-12 DIAGNOSIS — Z79899 Other long term (current) drug therapy: Secondary | ICD-10-CM | POA: Insufficient documentation

## 2021-03-12 DIAGNOSIS — Z950 Presence of cardiac pacemaker: Secondary | ICD-10-CM | POA: Insufficient documentation

## 2021-03-12 DIAGNOSIS — F17210 Nicotine dependence, cigarettes, uncomplicated: Secondary | ICD-10-CM | POA: Diagnosis not present

## 2021-03-12 DIAGNOSIS — K635 Polyp of colon: Secondary | ICD-10-CM

## 2021-03-12 DIAGNOSIS — D124 Benign neoplasm of descending colon: Secondary | ICD-10-CM | POA: Diagnosis not present

## 2021-03-12 DIAGNOSIS — R195 Other fecal abnormalities: Secondary | ICD-10-CM

## 2021-03-12 DIAGNOSIS — K648 Other hemorrhoids: Secondary | ICD-10-CM | POA: Insufficient documentation

## 2021-03-12 DIAGNOSIS — N189 Chronic kidney disease, unspecified: Secondary | ICD-10-CM | POA: Insufficient documentation

## 2021-03-12 DIAGNOSIS — D490 Neoplasm of unspecified behavior of digestive system: Secondary | ICD-10-CM | POA: Diagnosis not present

## 2021-03-12 DIAGNOSIS — I351 Nonrheumatic aortic (valve) insufficiency: Secondary | ICD-10-CM | POA: Diagnosis not present

## 2021-03-12 DIAGNOSIS — D125 Benign neoplasm of sigmoid colon: Secondary | ICD-10-CM | POA: Insufficient documentation

## 2021-03-12 DIAGNOSIS — K5669 Other partial intestinal obstruction: Secondary | ICD-10-CM | POA: Insufficient documentation

## 2021-03-12 DIAGNOSIS — F1010 Alcohol abuse, uncomplicated: Secondary | ICD-10-CM | POA: Insufficient documentation

## 2021-03-12 DIAGNOSIS — I129 Hypertensive chronic kidney disease with stage 1 through stage 4 chronic kidney disease, or unspecified chronic kidney disease: Secondary | ICD-10-CM | POA: Insufficient documentation

## 2021-03-12 HISTORY — PX: SCLEROTHERAPY: SHX6841

## 2021-03-12 HISTORY — PX: BIOPSY: SHX5522

## 2021-03-12 HISTORY — PX: POLYPECTOMY: SHX5525

## 2021-03-12 HISTORY — PX: HEMOSTASIS CLIP PLACEMENT: SHX6857

## 2021-03-12 HISTORY — PX: COLONOSCOPY WITH PROPOFOL: SHX5780

## 2021-03-12 LAB — HM COLONOSCOPY

## 2021-03-12 SURGERY — COLONOSCOPY WITH PROPOFOL
Anesthesia: General

## 2021-03-12 MED ORDER — PROPOFOL 10 MG/ML IV BOLUS
INTRAVENOUS | Status: DC | PRN
  Administered 2021-03-12: 100 mg via INTRAVENOUS
  Administered 2021-03-12: 40 mg via INTRAVENOUS

## 2021-03-12 MED ORDER — PROPOFOL 500 MG/50ML IV EMUL
INTRAVENOUS | Status: DC | PRN
Start: 2021-03-12 — End: 2021-03-12
  Administered 2021-03-12: 100 ug/kg/min via INTRAVENOUS

## 2021-03-12 MED ORDER — PHENYLEPHRINE 40 MCG/ML (10ML) SYRINGE FOR IV PUSH (FOR BLOOD PRESSURE SUPPORT)
PREFILLED_SYRINGE | INTRAVENOUS | Status: DC | PRN
  Administered 2021-03-12 (×5): 80 ug via INTRAVENOUS

## 2021-03-12 MED ORDER — LACTATED RINGERS IV SOLN
INTRAVENOUS | Status: DC
  Administered 2021-03-12: 1000 mL via INTRAVENOUS

## 2021-03-12 MED ORDER — PHENYLEPHRINE 40 MCG/ML (10ML) SYRINGE FOR IV PUSH (FOR BLOOD PRESSURE SUPPORT)
PREFILLED_SYRINGE | INTRAVENOUS | Status: AC
  Filled 2021-03-12: qty 10

## 2021-03-12 MED ORDER — SPOT INK MARKER SYRINGE KIT
PACK | SUBMUCOSAL | Status: AC
  Filled 2021-03-12: qty 5

## 2021-03-12 MED ORDER — LIDOCAINE HCL (CARDIAC) PF 100 MG/5ML IV SOSY
PREFILLED_SYRINGE | INTRAVENOUS | Status: DC | PRN
  Administered 2021-03-12: 50 mg via INTRAVENOUS

## 2021-03-12 MED ORDER — SPOT INK MARKER SYRINGE KIT
PACK | SUBMUCOSAL | Status: DC | PRN
  Administered 2021-03-12: 5 mL via SUBMUCOSAL

## 2021-03-12 NOTE — Transfer of Care (Signed)
Immediate Anesthesia Transfer of Care Note  Patient: Fernando Dean  Procedure(s) Performed: COLONOSCOPY WITH PROPOFOL BIOPSY POLYPECTOMY SCLEROTHERAPY HEMOSTASIS CLIP PLACEMENT  Patient Location: Short Stay  Anesthesia Type:General  Level of Consciousness: drowsy  Airway & Oxygen Therapy: Patient Spontanous Breathing  Post-op Assessment: Report given to RN and Post -op Vital signs reviewed and stable  Post vital signs: Reviewed and stable  Last Vitals:  Vitals Value Taken Time  BP    Temp    Pulse    Resp    SpO2      Last Pain:  Vitals:   03/12/21 1009  TempSrc:   PainSc: 0-No pain      Patients Stated Pain Goal: 3 (51/89/84 2103)  Complications: No notable events documented.

## 2021-03-12 NOTE — Op Note (Signed)
[facility] Patient Name: Fernando Dean Procedure Date: 03/12/2021 10:03 AM MRN: [id number] Date of Birth: 15-Oct-1950 Attending MD: [name] ,  CSN: [id number] Age: 71 Admit Type: Outpatient Procedure:                Colonoscopy Indications:              fecal occult blood test positive Providers:                [name], Rosina Lowenstein, RN, Raphael Gibney,                            Technician Referring MD:              Medicines:                Monitored Anesthesia Care Complications:            No immediate complications. Estimated Blood Loss:     Estimated blood loss: none. Procedure:                Pre-Anesthesia Assessment:                           - Prior to the procedure, a History and Physical                            was performed, and patient medications, allergies                            and sensitivities were reviewed. The patient's                            tolerance of previous anesthesia was reviewed.                           - The risks and benefits of the procedure and the                            sedation options and risks were discussed with the                            patient. All questions were answered and informed                            consent was obtained.                           - ASA Grade Assessment: III - A patient with severe                            systemic disease.                           After obtaining informed consent, the colonoscope                            was passed under direct vision. Throughout the  procedure, the patient's blood pressure, pulse, and                            oxygen saturations were monitored continuously. The                            [id number]-HQ190L ([id number]) scope was introduced through                            the anus and advanced to the the descending colon.                            The patient tolerated the procedure well. The                             colonoscopy was extremely difficult due to                            significant luminal narrowing in the sigmoid colon.                            The quality of the bowel preparation was good. Scope In: 10:13:03 AM Scope Out: 11:31:34 AM Total Procedure Duration: 1 hour 18 minutes 31 seconds  Findings:      The perianal and digital rectal examinations were normal.      A fungating partially obstructing large mass was found at 40 cm proximal       to the anus. The mass was circumferential. No bleeding was present. This       was biopsied with a cold jumbo forceps for histology. I attempted to       advance the scope past this area but there was significant narrowing. I       downgraded the scope to an ultraslim scope but it did not allow passage       of the scope. Area distal to the mass was successfully injected with 5       mL [place] ink for tattooing.      A 20 mm polyp was found in the sigmoid colon. The polyp was       pedunculated. The polyp was removed with a hot snare. Resection and       retrieval were complete. To prevent bleeding after the polypectomy,       three hemostatic clips were places but only two successfully placed as       one of the clips broke upon deployment. There was no bleeding at the end       of the procedure.      Non-bleeding internal hemorrhoids were found during retroflexion. The       hemorrhoids were small. 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 Biopsied. Injected.                           -  One 20 mm polyp in the sigmoid colon, removed                            with a hot snare. Resected and retrieved. Clips                            were placed.                           - Non-bleeding internal hemorrhoids. Moderate Sedation:      Per Anesthesia Care Recommendation:           - Discharge patient to home (ambulatory).                           -  Resume previous diet.                           - Await pathology results.                           - Repeat colonoscopy 3 months after undergoing                            sigmoid resection for screening purposes.                           - Check CEA level.                           - Referral to [facility] surgery for sigmoid                            resection - high concern the mass is cancerous. Procedure Code(s):        --- Professional ---                           [phone], 52, Colonoscopy, flexible; with removal of                            tumor(s), polyp(s), or other lesion(s) by snare                            technique                           45381, 52, Colonoscopy, flexible; with directed                            submucosal injection(s), any substance                           45380, 59,52, Colonoscopy, flexible; with biopsy,                            single or multiple Diagnosis Code(s):        --- Professional ---  D49.0, Neoplasm of unspecified behavior of                            digestive system                           K56.690, Other partial intestinal obstruction                           K63.5, Polyp of colon                           K64.8, Other hemorrhoids                           R19.5, Other fecal abnormalities CPT copyright 2019 American Medical Association. All rights reserved. The codes documented in this report are preliminary and upon coder review may  be revised to meet current compliance requirements. [name], MD [name],  03/12/2021 11:42:53 AM This report has been signed electronically. Number of Addenda: 0

## 2021-03-12 NOTE — H&P (Signed)
Fernando Dean is an 71 y.o. male.   Chief Complaint: positive FOBT  HPI: Fernando Dean is a 71 y.o. male with past medical history of chronic tobacco and alcohol abuse, who presents for evaluation of positive fecal occult blood test.  The patient denies having any nausea, vomiting, fever, chills, hematochezia, melena, hematemesis, abdominal distention, abdominal pain, diarrhea, jaundice, pruritus or weight loss. Never had a colonoscopy.  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 MRI 2272 dual chamber PPM   Tuberculosis    as a child, was treated    Past Surgical History:  Procedure Laterality Date   ABDOMINAL AORTIC ENDOVASCULAR STENT GRAFT Bilateral 02/26/2021   Procedure: ABDOMINAL AORTIC ENDOVASCULAR STENT GRAFT REPAIR;  Surgeon: Marty Heck, MD;  Location: [facility] OR;  Service: Vascular;  Laterality: Bilateral;   HERNIA REPAIR Left 10/25/2006   PACEMAKER IMPLANT N/A 02/08/2021   Procedure: PACEMAKER IMPLANT;  Surgeon: Deboraha Sprang, MD;  Location: [facility];  Service: Cardiovascular;  Laterality: N/A;    Family History  Problem Relation Age of Onset   Bladder Cancer Mother    Heart disease Father    Social History:  reports that he has been smoking cigarettes. He has a 37.50 pack-year smoking history. He has never used smokeless tobacco. He reports current alcohol use of about 2.0 - 3.0 standard drinks per week.  Drug: Marijuana.  Allergies:  Allergies  Allergen Reactions   Advil [Ibuprofen] Shortness Of Breath and Palpitations   Aleve [Naproxen] Shortness Of Breath   Nsaids Shortness Of Breath    Medications Prior to Admission  Medication Sig Dispense Refill    amLODipine (NORVASC) 10 MG tablet Take 1 tablet (10 mg total) by mouth daily. 30 tablet 3   carvedilol (COREG) 6.25 MG tablet Take 1 tablet (6.25 mg total) by mouth 2 (two) times daily with a meal. 60 tablet 3   Multiple Vitamins-Minerals (MULTIVITAMIN WITH MINERALS) tablet Take 1 tablet by mouth daily. Centrum Silver for Men 50+     nicotine (NICODERM CQ - DOSED IN MG/24 HOURS) 21 mg/24hr patch Place 21 mg onto the skin daily.     oxyCODONE-acetaminophen (PERCOCET/ROXICET) 5-325 MG tablet Take 1 tablet by mouth every 6 (six) hours as needed for moderate pain. (Patient taking differently: Take 1 tablet by mouth in the morning and at bedtime.) 15 tablet 0   simvastatin (ZOCOR) 10 MG tablet Take 1 tablet (10 mg total) by mouth daily at 6 PM. 30 tablet 2   aspirin EC 81 MG EC tablet Take 1 tablet (81 mg total) by mouth daily at 6 (six) AM. Swallow whole. 30 tablet 11    No results found for this or any previous visit (from the past 48 hour(s)). No results found.  Review of Systems  Constitutional: Negative.   HENT: Negative.    Eyes: Negative.   Respiratory: Negative.    Cardiovascular: Negative.   Gastrointestinal: Negative.   Endocrine: Negative.   Genitourinary: Negative.   Musculoskeletal: Negative.   Skin: Negative.   Allergic/Immunologic: Negative.   Neurological: Negative.   Hematological: Negative.   Psychiatric/Behavioral: Negative.     Blood  pressure (!) 149/76, temperature 98.2 F (36.8 C), temperature source Oral, resp. rate 20, SpO2 100 %. Physical Exam  GENERAL: The patient is AO x3, in no acute distress. HEENT: Head is normocephalic and atraumatic. EOMI are intact. Mouth is well hydrated and without lesions. NECK: Supple. No masses LUNGS: Clear to auscultation. No presence of rhonchi/wheezing/rales. Adequate chest expansion HEART: RRR, normal s1 and s2. ABDOMEN: Soft, nontender, no guarding, no peritoneal signs, and nondistended. BS +. No masses. EXTREMITIES: Without  any cyanosis, clubbing, rash, lesions or edema. NEUROLOGIC: AOx3, no focal motor deficit. SKIN: no jaundice, no rashes  Assessment/Plan Fernando Dean is a 71 y.o. male with past medical history of chronic tobacco and alcohol abuse, who presents for evaluation of positive fecal occult blood test.  Harvel Quale, MD 03/12/2021, 10:03 AM

## 2021-03-12 NOTE — Anesthesia Postprocedure Evaluation (Signed)
Anesthesia Post Note  Patient: Fernando Dean  Procedure(s) Performed: COLONOSCOPY WITH PROPOFOL BIOPSY POLYPECTOMY [place]  Patient location during evaluation: Endoscopy Anesthesia Type: General Level of consciousness: awake and alert Pain management: pain level controlled Vital Signs Assessment: post-procedure vital signs reviewed and stable Respiratory status: spontaneous breathing, nonlabored ventilation, respiratory function stable and patient connected to nasal cannula oxygen Cardiovascular status: blood pressure returned to baseline and stable Postop Assessment: no apparent nausea or vomiting Anesthetic complications: no   No notable events documented.   Last Vitals:  Vitals:   03/12/21 1137 03/12/21 1140  BP: (!) 127/53   Pulse: 61   Resp: 15   Temp:    SpO2:  100%    Last Pain:  Vitals:   03/12/21 1009  TempSrc:   PainSc: 0-No pain                 Trixie Rude

## 2021-03-12 NOTE — Anesthesia Procedure Notes (Signed)
Date/Time: 03/12/2021 10:16 AM Performed by: Orlie Dakin, CRNA Pre-anesthesia Checklist: Patient identified, Emergency Drugs available, Suction available and Patient being monitored Patient Re-evaluated:Patient Re-evaluated prior to induction Oxygen Delivery Method: Nasal cannula Induction Type: IV induction Placement Confirmation: positive ETCO2

## 2021-03-12 NOTE — Anesthesia Preprocedure Evaluation (Signed)
Anesthesia Evaluation  Patient identified by MRN, date of birth, ID band Patient awake    Reviewed: Allergy & Precautions, H&P , NPO status , Patient's Chart, lab work & pertinent test results  Airway Mallampati: II   Neck ROM: full    Dental  (+) Upper Dentures, Poor Dentition,    Pulmonary neg pulmonary ROS, Current Smoker and Patient abstained from smoking.,    breath sounds clear to auscultation       Cardiovascular Exercise Tolerance: Good METS: 3 - Mets hypertension, + Peripheral Vascular Disease  + dysrhythmias (heart block hx) + pacemaker AI  Rhythm:regular Rate:Normal  TTE (02/05/2021): EF 60-65%, moderate AI. Dilated LA and RA.   Neuro/Psych negative neurological ROS     GI/Hepatic negative GI ROS, Neg liver ROS,   Endo/Other  negative endocrine ROS  Renal/GU Renal Insufficiency and CRFRenal disease     Musculoskeletal negative musculoskeletal ROS (+)   Abdominal   Peds  Hematology negative hematology ROS (+)   Anesthesia Other Findings   Reproductive/Obstetrics                             Anesthesia Physical Anesthesia Plan  ASA: 3  Anesthesia Plan: General   Post-op Pain Management:    Induction: Intravenous  PONV Risk Score and Plan: 1 and TIVA and Propofol infusion  Airway Management Planned: Natural Airway and Nasal Cannula  Additional Equipment:   Intra-op Plan:   Post-operative Plan:   Informed Consent: I have reviewed the patients History and Physical, chart, labs and discussed the procedure including the risks, benefits and alternatives for the proposed anesthesia with the patient or authorized representative who has indicated his/her understanding and acceptance.     Dental advisory given  Plan Discussed with: CRNA  Anesthesia Plan Comments:         Anesthesia Quick Evaluation

## 2021-03-12 NOTE — Discharge Instructions (Signed)
You are being discharged to home.  Resume your previous diet.  We are waiting for your pathology results.  Your physician has recommended a repeat colonoscopy 3 months after undergoing sigmoid resection for screening purposes.  Referral to [facility] surgery for sigmoid resection - high concern the mass is cancerous.

## 2021-03-13 LAB — CEA: CEA: 8.3 ng/mL — ABNORMAL HIGH (ref 0.0–4.7)

## 2021-03-15 ENCOUNTER — Encounter (INDEPENDENT_AMBULATORY_CARE_PROVIDER_SITE_OTHER): Payer: Self-pay | Admitting: *Deleted

## 2021-03-15 ENCOUNTER — Encounter (HOSPITAL_COMMUNITY): Payer: Self-pay | Admitting: Gastroenterology

## 2021-03-15 LAB — SURGICAL PATHOLOGY

## 2021-03-22 ENCOUNTER — Ambulatory Visit: Payer: Self-pay | Admitting: General Surgery

## 2021-03-22 ENCOUNTER — Telehealth: Payer: Self-pay | Admitting: *Deleted

## 2021-03-22 DIAGNOSIS — K6389 Other specified diseases of intestine: Secondary | ICD-10-CM | POA: Diagnosis not present

## 2021-03-22 NOTE — Telephone Encounter (Signed)
Pt has appt 04/14/21 Dr. Marcello Moores. We have never seen this pt in our office before as well. We have only seen the pt when he had a pacemaker implant.

## 2021-03-22 NOTE — Telephone Encounter (Signed)
Kelly from [facility] called back and left vm procedure information. PRE PROCEDURE: PARTIAL ROBOTIC COLECTOMY

## 2021-03-22 NOTE — Telephone Encounter (Signed)
Pt has appt with Dr. Johnsie Cancel 04/14/21 for pre op clearance. See previous notes.   I will update the requesting office the pt has an appt for pre op clearance with Dr. Johnsie Cancel 04/14/21.   I will send a note to chart prep team as well to be sure that we have the updated information for Dr. Johnsie Cancel. We have only seen the pt for a PPM Implant 02/08/21 with Dr. Lovena Le.

## 2021-03-22 NOTE — Telephone Encounter (Signed)
I am going to reach out to [place] office to please help find an appt for pre op clearance.

## 2021-03-22 NOTE — Telephone Encounter (Signed)
Primary Cardiologist:Samuel Domenic Polite, MD  Chart reviewed as part of pre-operative protocol coverage. Because of Mackinley E Brager's past medical history and time since last visit, he/she will require a follow-up visit in order to better assess preoperative cardiovascular risk.  Pre-op covering staff: - Please schedule appointment and call patient to inform them. - Please contact requesting surgeon's office via preferred method (i.e, phone, fax) to inform them of need for appointment prior to surgery.  If applicable, this message will also be routed to pharmacy pool and/or primary cardiologist for input on holding anticoagulant/antiplatelet agent as requested below so that this information is available at time of patient's appointment.   Deberah Pelton, NP  03/22/2021, 12:59 PM

## 2021-03-22 NOTE — Telephone Encounter (Signed)
° °  Pre-operative Risk Assessment    Patient Name: Fernando Dean  DOB: 1950/09/26 MRN: [id number]      Request for Surgical Clearance    Procedure:  COLON SURGERY; CALLED REQUESTING OFFICE TO CONFIRM WHAT TYPE OF COLON SURGERY  Date of Surgery:  Clearance TBD                                 Surgeon:  DR. Leighton Ruff Surgeon's Group or Practice Name:  [facility] Phone number:  [phone] Fax number:  [phone] ATTN: [name], LPN   Type of Clearance Requested:   - Medical  - Pharmacy:  Hold Aspirin     Type of Anesthesia:  General    Additional requests/questions:    [name]   03/22/2021, 12:05 PM

## 2021-03-23 NOTE — Telephone Encounter (Signed)
Preoperative team, please place this patient on the callback list.  His surgery is urgent and he has not yet seen cardiology in follow-up since been admitted to the hospital.  Thank you for your help.  Jossie Ng. Zariah Jost NP-C    03/23/2021, 3:29 PM [address] Office [phone] Fax [phone]

## 2021-03-23 NOTE — Telephone Encounter (Signed)
This message was sent to me. I am out of the of the office. As the surgeon's notes states she feels the pt cannot wait until we have seen him in cardiology for pre op clearance. Pt has appt with Dr. Johnsie Cancel 04/14/21. I am going to forward this to pre op provider to address as I am not in the office and this needs to be addressed by pre op provider.   See previous notes.

## 2021-03-23 NOTE — Telephone Encounter (Signed)
Pt has been scheduled to see Dr. Ali Lowe, this Thursday, 03/25/21, clearance will be addressed at that time.  I have routed it back to Dr. Marcello Moores to make her aware.

## 2021-03-23 NOTE — Telephone Encounter (Signed)
Preoperative team, this patient needs to go on the callback list.  According to Dr. Marcello Moores he will need to be seen as soon as possible.  Thank you.  Jossie Ng. Musab Wingard NP-C    03/23/2021, [facility] [place] [address] Office [phone] Fax [phone]

## 2021-03-25 ENCOUNTER — Other Ambulatory Visit: Payer: Self-pay

## 2021-03-25 ENCOUNTER — Ambulatory Visit: Payer: Medicare Other | Admitting: Internal Medicine

## 2021-03-25 ENCOUNTER — Encounter: Payer: Self-pay | Admitting: Internal Medicine

## 2021-03-25 VITALS — BP 140/72 | HR 61 | Ht 72.0 in | Wt 173.0 lb

## 2021-03-25 DIAGNOSIS — I10 Essential (primary) hypertension: Secondary | ICD-10-CM

## 2021-03-25 DIAGNOSIS — Z8679 Personal history of other diseases of the circulatory system: Secondary | ICD-10-CM

## 2021-03-25 DIAGNOSIS — Z950 Presence of cardiac pacemaker: Secondary | ICD-10-CM

## 2021-03-25 DIAGNOSIS — Z01818 Encounter for other preprocedural examination: Secondary | ICD-10-CM

## 2021-03-25 DIAGNOSIS — Z9889 Other specified postprocedural states: Secondary | ICD-10-CM | POA: Diagnosis not present

## 2021-03-25 DIAGNOSIS — E785 Hyperlipidemia, unspecified: Secondary | ICD-10-CM | POA: Diagnosis not present

## 2021-03-25 MED ORDER — ATORVASTATIN CALCIUM 40 MG PO TABS: 40.0000 mg | ORAL_TABLET | Freq: Every day | ORAL | 3 refills | Status: DC

## 2021-03-25 NOTE — Patient Instructions (Signed)
Medication Instructions:  START ATORVASTATIN 40 MG AT BEDTIME *If you need a refill on your cardiac medications before your next appointment, please call your pharmacy*   Lab Work: LIPID AND LIVER IN 2 MONTHS DUE IN APRIL If you have labs (blood work) drawn today and your tests are completely normal, you will receive your results only by: [place] (if you have MyChart) OR A paper copy in the mail If you have any lab test that is abnormal or we need to change your treatment, we will call you to review the results.   Testing/Procedures: NONE   Follow-Up: At [facility], you and your health needs are our priority.  As part of our continuing mission to provide you with exceptional heart care, we have created designated Provider Care Teams.  These Care Teams include your primary Cardiologist (physician) and Advanced Practice Providers (APPs -  Physician Assistants and Nurse Practitioners) who all work together to provide you with the care you need, when you need it.  We recommend signing up for the patient portal called "MyChart".  Sign up information is provided on this After Visit Summary.  MyChart is used to connect with patients for Virtual Visits (Telemedicine).  Patients are able to view lab/test results, encounter notes, upcoming appointments, etc.  Non-urgent messages can be sent to your provider as well.   To learn more about what you can do with MyChart, go to [email or web address].    Your next appointment:   6 month(s)  The format for your next appointment:   In Person  Provider:   DR Ali Lowe    Other Instructions NONE

## 2021-03-25 NOTE — Telephone Encounter (Signed)
Patient was seen by Dr. Ali Lowe today and was cleared for surgery.  I have forwarded to office note to the surgeon's office.

## 2021-03-25 NOTE — Progress Notes (Signed)
Cardiology Office Note:    Date:  03/25/2021   ID:  Fernando Dean, DOB [birth year], MRN [id number]  PCP:  [name], [place] [facility] Cardiologist:  Lenna Sciara, MD Referring MD: [name], FNP   Chief Complaint/Reason for Referral: Preoperative cardiac assessment prior to noncardiac surgery  ASSESSMENT:    Encounter for preoperative assessment  S/P AAA repair  Pacemaker  Essential hypertension    PLAN:    In order of problems listed above:  1.  The patient underwent general anesthesia last month for a vascular surgery procedure without cardiovascular complications.  His echocardiogram demonstrated normal function.  No further cardiac evaluation is required prior to colectomy.  Follow-up in in 6 months.  2.  Continue aspirin and start atorvastatin 40mg ; given history of peripheral vascular disease his LDL goal is less than 70.  We will check lipid panel and LFTs in 2 months.  3.  This is being followed by electrophysiology.  4.  Blood pressure is mildly above goal today.  The next time he encounters provider if remains elevated and adjustment of his medications will be required.  This may include instituting losartan.  He does have a history of chronic kidney disease so monitoring his creatinine will be essential.    Dispo:  No follow-ups on file.     Medication Adjustments/Labs and Tests Ordered: Current medicines are reviewed at length with the patient today.  Concerns regarding medicines are outlined above.   Tests Ordered: No orders of the defined types were placed in this encounter.   Medication Changes: No orders of the defined types were placed in this encounter.   History of Present Illness:    FOCUSED CARDIOVASCULAR PROBLEM LIST:   1.  Complete heart block status post permanent pacemaker 2022 2.  Infrarenal aortic aneurysm status post EVAR January 2023 3.  Chronic kidney disease with GFR around 1.5-1.8 4.  Hypertension 5.   Hyperlipidemia 6.  Moderate aortic insufficiency   The patient is a 71 y.o. male with the indicated medical history here for preoperative cardiac assessment for colectomy.  He recently underwent EVAR with general anesthesia last month without complications.  The patient is able to do at all of his activities of daily living without exertional angina or dyspnea.  He denies any palpitations, paroxysmal atrial dyspnea, orthopnea, presyncope, syncope, signs or symptoms of stroke, or severe bleeding.  He has not noticed any lower GI bleeding recently.  He has required no hospitalizations or emergency room visits.        Previous Medical History: 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 MRI 2272 dual chamber PPM   Tuberculosis    as a child, was treated     Current Medications: No outpatient medications have been marked as taking for the 03/25/21 encounter (Appointment) with Early Osmond, MD.     Allergies:    Advil [ibuprofen], Aleve [naproxen], and Nsaids   Social History:   Social History   Tobacco Use   Smoking status: Every Day    Packs/day: 0.75    Years: 50.00    Pack years: 37.50    Types:  Cigarettes   Smokeless tobacco: Never  Vaping Use   Vaping Use: Never used  Substance Use Topics   Alcohol use: Yes    Alcohol/week: 2.0 - 3.0 standard drinks    Types: 2 - 3 Shots of liquor per week    Comment: daily liquor moderatly     Family Hx: Family History  Problem Relation Age of Onset   Bladder Cancer Mother    Heart disease Father      Review of Systems:   Please see the history of present illness.    All other systems reviewed and are negative.     EKGs/Labs/Other Test  Reviewed:    EKG: A sensing and V paced; EKG today demonstrates sinus rhythm with right bundle branch block and LAFB woth rare PVC  Prior CV studies:  12/22 TTE  1. Left ventricular ejection fraction, by estimation, is 60 to 65%. The  left ventricle has normal function. The left ventricle has no regional  wall motion abnormalities. There is mild left ventricular hypertrophy.  Left ventricular diastolic parameters  are indeterminate.   2. Right ventricular systolic function is normal. The right ventricular  size is not well visualized. There is mildly elevated pulmonary artery  systolic pressure.   3. Left atrial size was severely dilated.   4. Right atrial size was moderately dilated.   5. The mitral valve is grossly normal. Trivial mitral valve  regurgitation.   6. AI jet against the anterior mitral valve leaflet. Jet eccentric P1/2T  may underestimate. The aortic valve is calcified. Aortic valve  regurgitation is moderate.   7. The inferior vena cava is normal in size with greater than 50%  respiratory variability, suggesting right atrial pressure of 3 mmHg.  Imaging studies that I have independently reviewed today: Echocardiogram  Recent Labs: 02/04/2021: TSH 1.006 02/24/2021: ALT 22 02/26/2021: Magnesium 1.9 02/27/2021: BUN 26; Creatinine, Ser 1.62; Potassium 5.0; Sodium 135 03/09/2021: Hemoglobin 10.8; Platelets 249   Recent Lipid Panel No results found for: CHOL, TRIG, HDL, LDLCALC, LDLDIRECT  Risk Assessment/Calculations:          Physical Exam:    VS:  There were no vitals taken for this visit.   Wt Readings from Last 3 Encounters:  03/09/21 (P) 171 lb (77.6 kg)  02/27/21 170 lb 3.1 oz (77.2 kg)  02/24/21 171 lb 3.2 oz (77.7 kg)    GENERAL:  No apparent distress, AOx3 HEENT:  No carotid bruits, +2 carotid impulses, no scleral icterus CAR: RRR no murmurs, gallops, rubs, or thrills RES:  Clear to auscultation bilaterally ABD:  Soft, nontender, nondistended,  positive bowel sounds x 4 VASC:  +2 radial pulses, +2 carotid pulses, palpable pedal pulses NEURO:  CN 2-12 grossly intact; motor and sensory grossly intact PSYCH:  No active depression or anxiety EXT:  No edema, ecchymosis, or cyanosis  Signed, Early Osmond, MD  03/25/2021 7:38 AM    [address], [place], [place]  [address] Phone: [phone]; Fax: [phone]   Note:  This document was prepared using Dragon voice recognition software and may include unintentional dictation errors.

## 2021-03-31 ENCOUNTER — Ambulatory Visit: Payer: Medicare Other | Admitting: Internal Medicine

## 2021-03-31 ENCOUNTER — Other Ambulatory Visit: Payer: Self-pay

## 2021-04-01 ENCOUNTER — Ambulatory Visit (INDEPENDENT_AMBULATORY_CARE_PROVIDER_SITE_OTHER): Payer: Medicare Other | Admitting: Gastroenterology

## 2021-04-01 ENCOUNTER — Encounter (INDEPENDENT_AMBULATORY_CARE_PROVIDER_SITE_OTHER): Payer: Self-pay | Admitting: Gastroenterology

## 2021-04-01 ENCOUNTER — Ambulatory Visit: Payer: Medicare Other | Admitting: Cardiovascular Disease

## 2021-04-01 VITALS — BP 122/69 | HR 74 | Temp 97.4°F | Ht 72.0 in | Wt 173.0 lb

## 2021-04-01 DIAGNOSIS — K6389 Other specified diseases of intestine: Secondary | ICD-10-CM | POA: Diagnosis not present

## 2021-04-01 NOTE — Progress Notes (Signed)
Referring Provider: [name], FNP Primary Care Physician:  Lindell Spar, MD Primary GI Physician: Jenetta Downer  Chief Complaint  Patient presents with   Hospitalization Follow-up    Pt arrives with sister Hilbert Odor for a hospital follow up. States he is feeling good and no concerns today.    HPI:   Fernando Dean is a 71 y.o. male with past medical history of chronic tobacco and alcohol abuse.   Patient presenting today for follow up  Patient underwent colonoscopy 03/12/21 for positive FOBT. 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 Patient was referred for [facility] intervention at [facility] [place] [facility] for concerns of underlying malignancy in tumor from descending colon along with elevated CEA (8.4). Patient saw Dr. Leighton Ruff at [facility] on 03/22/21 who recommended minimally invasive partial colectomy. Recommendations for patient to have follow up colonoscopy 3 months after colon resection if there is in fact malignancy or 1-1.5 years after if findings are benign.   States that he saw [facility] at end of January. States he is unsure yet when the surgery will be, as he is waiting to hear back from them. Overall he is doing well. He has no GI complaints. States bowel are moving regularly and he has actually gained some weight. He has no blood in his stools or black stools. Denies abdominal pain, nausea or vomiting.   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 Resected and retrieved. Clips were placed. - Non-bleeding internal hemorrhoids.  Past Medical History:  Diagnosis Date   AAA (abdominal aortic aneurysm)    Alcohol use    Aortic regurgitation    moderate AR 02/05/21 echo   Chronic kidney  disease    stage 3   Heme positive stool    Hypertension    Presence of permanent cardiac pacemaker 02/08/2021   Inserted 02/08/21 for CHB - St Jude/[employer] Ass[employer] MRI 2272 dual chamber PPM   Tuberculosis    as a child, was treated    Past Surgical History:  Procedure Laterality Date   ABDOMINAL AORTIC ENDOVASCULAR STENT GRAFT Bilateral 02/26/2021   Procedure: ABDOMINAL AORTIC ENDOVASCULAR STENT GRAFT REPAIR;  Surgeon: Marty Heck, MD;  Location: [facility] OR;  Service: Vascular;  Laterality: Bilateral;   BIOPSY  03/12/2021   Procedure: BIOPSY;  Surgeon: Harvel Quale, MD;  Location: [facility];  Service: Gastroenterology;;   COLONOSCOPY WITH PROPOFOL N/A 03/12/2021   Procedure: COLONOSCOPY WITH PROPOFOL;  Surgeon: Harvel Quale, MD;  Location: [facility];  Service: Gastroenterology;  Laterality: N/A;  10:55   HEMOSTASIS CLIP PLACEMENT  03/12/2021   Procedure: HEMOSTASIS CLIP PLACEMENT;  Surgeon: Harvel Quale, MD;  Location: [facility];  Service: Gastroenterology;;   HERNIA REPAIR Left 10/25/2006   PACEMAKER IMPLANT N/A 02/08/2021   Procedure: PACEMAKER IMPLANT;  Surgeon: Deboraha Sprang, MD;  Location: [facility];  Service: Cardiovascular;  Laterality: N/A;   POLYPECTOMY  03/12/2021   Procedure: POLYPECTOMY;  Surgeon: Harvel Quale, MD;  Location: [facility];  Service: Gastroenterology;;   [name]  03/12/2021   Procedure: [name];  Surgeon: [name], [name], MD;  Location: [facility];  Service: Gastroenterology;;    Current Outpatient Medications  Medication Sig Dispense Refill  amLODipine (NORVASC) 10 MG tablet Take 1 tablet (10 mg total) by mouth daily. 30 tablet 3   aspirin EC 81 MG EC tablet Take 1 tablet (81 mg total) by mouth daily at 6 (six) AM. Swallow whole. 30 tablet 11   atorvastatin (LIPITOR) 40 MG tablet Take 1 tablet (40 mg total) by mouth daily. 90 tablet 3   carvedilol (COREG)  6.25 MG tablet Take 1 tablet (6.25 mg total) by mouth 2 (two) times daily with a meal. 60 tablet 3   Multiple Vitamins-Minerals (MULTIVITAMIN WITH MINERALS) tablet Take 1 tablet by mouth daily. Centrum Silver for Men 50+     nicotine (NICODERM CQ - DOSED IN MG/24 HOURS) 21 mg/24hr patch Place 21 mg onto the skin daily.     No current facility-administered medications for this visit.    Allergies as of 04/01/2021 - Review Complete 04/01/2021  Allergen Reaction Noted   Advil [ibuprofen] Shortness Of Breath and Palpitations 02/01/2021   Aleve [naproxen] Shortness Of Breath 02/01/2021   Nsaids Shortness Of Breath 02/01/2021    Family History  Problem Relation Age of Onset   Bladder Cancer Mother    Heart disease Father     Social History   Socioeconomic History   Marital status: Single    Spouse name: Not on file   Number of children: Not on file   Years of education: Not on file   Highest education level: Not on file  Occupational History   Not on file  Tobacco Use   Smoking status: Every Day    Packs/day: 0.75    Years: 50.00    Pack years: 37.50    Types: Cigarettes   Smokeless tobacco: Never  Vaping Use   Vaping Use: Never used  Substance and Sexual Activity   Alcohol use: Yes    Alcohol/week: 2.0 - 3.0 standard drinks    Types: 2 - 3 Shots of liquor per week    Comment: daily liquor moderatly   Drug use: Not on file    Comment: Last use 02/21/21   Sexual activity: Not Currently  Other Topics Concern   Not on file  Social History Narrative   Not on file   Social Determinants of Health   Financial Resource Strain: Not on file  Food Insecurity: Not on file  Transportation Needs: Not on file  Physical Activity: Not on file  Stress: Not on file  Social Connections: Not on file   Review of systems General: negative for malaise, night sweats, fever, chills, weight loss Neck: Negative for lumps, goiter, pain and significant neck swelling Resp: Negative for  cough, wheezing, dyspnea at rest CV: Negative for chest pain, leg swelling, palpitations, orthopnea GI: denies melena, hematochezia, nausea, vomiting, diarrhea, constipation, dysphagia, odyonophagia, early satiety or unintentional weight loss.  MSK: Negative for joint pain or swelling, back pain, and muscle pain. Derm: Negative for itching or rash Psych: Denies depression, anxiety, memory loss, confusion. No homicidal or suicidal ideation.  Heme: Negative for prolonged bleeding, bruising easily, and swollen nodes. Endocrine: Negative for cold or heat intolerance, polyuria, polydipsia and goiter. Neuro: negative for tremor, gait imbalance, syncope and seizures. The remainder of the review of systems is noncontributory.  Physical Exam: BP 122/69 (BP Location: Right Arm, Patient Position: Sitting, Cuff Size: Large)    Pulse 74    Temp (!) 97.4 F (36.3 C) (Oral)    Ht 6' (1.829 m)    Wt 173 lb (78.5 kg)    BMI 23.46  kg/m  General:   Alert and oriented. No distress noted. Pleasant and cooperative.  Head:  Normocephalic and atraumatic. Eyes:  Conjuctiva clear without scleral icterus. Mouth:  Oral mucosa pink and moist. Good dentition. No lesions. Heart: Normal rate and rhythm, s1 and s2 heart sounds present.  Lungs: Clear lung sounds in all lobes. Respirations equal and unlabored. Abdomen:  +BS, soft, non-tender and non-distended. No rebound or guarding. No HSM or masses noted. Derm: No palmar erythema or jaundice Msk:  Symmetrical without gross deformities. Normal posture. Extremities:  Without edema. Neurologic:  Alert and  oriented x4 Psych:  Alert and cooperative. Normal mood and affect.  Invalid input(s): 6 MONTHS   ASSESSMENT: Fernando Dean is a 71 y.o. male presenting today for follow up after colonoscopy 03/12/21 with findings of obstructing mass, concerning for possible malignancy.  Patient has been evaluated by [facility] who is in the process of getting him scheduled for non  invasive partial colectomy. Patient is doing well with no GI complaints or symptoms today. I discussed with patient that depending on pathology after his surgery, timing of next colonoscopy will then be determined. If malignancy present, patient will need colonoscopy 3 months after procedure, if findings are benign, he will likely have repeat colonoscopy in 1-1.5 years unless any symptoms arise. We will be in touch with him once [facility] communicates pathology results from his upcoming surgery.   PLAN:  Continue to follow with [facility] for upcoming partial colectomy 2. Repeat colonoscopy timing to be determined after surgical pathology is communicated to [place] 3. Patient to let me know if he develops any GI symptoms.    Follow Up: Will be determined after partial colectomy with [facility] for repeat colonoscopy timing  Laurell Coalson L. Alver Sorrow, MSN, APRN, AGNP-C Adult-Gerontology Nurse Practitioner [facility]

## 2021-04-01 NOTE — Patient Instructions (Signed)
I am glad you are feeling well We will be in touch with you after your surgical procedure with [facility] to determine timing of next colonoscopy. Please let us know if you have any new GI symptoms occur

## 2021-04-06 ENCOUNTER — Encounter: Payer: Medicare Other | Admitting: Vascular Surgery

## 2021-04-08 ENCOUNTER — Ambulatory Visit (HOSPITAL_COMMUNITY)
Admission: RE | Admit: 2021-04-08 | Discharge: 2021-04-08 | Disposition: A | Discharge status: Home / Self Care | Payer: Medicare Other | Source: Ambulatory Visit | Attending: Vascular Surgery | Admitting: Vascular Surgery

## 2021-04-08 ENCOUNTER — Other Ambulatory Visit: Payer: Self-pay

## 2021-04-08 DIAGNOSIS — I7143 Infrarenal abdominal aortic aneurysm, without rupture: Secondary | ICD-10-CM | POA: Insufficient documentation

## 2021-04-08 DIAGNOSIS — R911 Solitary pulmonary nodule: Secondary | ICD-10-CM | POA: Diagnosis not present

## 2021-04-08 DIAGNOSIS — J9811 Atelectasis: Secondary | ICD-10-CM | POA: Diagnosis not present

## 2021-04-08 LAB — POCT I-STAT CREATININE: Creatinine, Ser: 2 mg/dL — ABNORMAL HIGH (ref 0.61–1.24)

## 2021-04-08 MED ORDER — IOHEXOL 350 MG/ML SOLN
80.0000 mL | Freq: Once | INTRAVENOUS | Status: AC | PRN
  Administered 2021-04-08: 80 mL via INTRAVENOUS

## 2021-04-13 ENCOUNTER — Encounter: Payer: Self-pay | Admitting: Vascular Surgery

## 2021-04-13 ENCOUNTER — Other Ambulatory Visit: Payer: Self-pay

## 2021-04-13 ENCOUNTER — Ambulatory Visit (INDEPENDENT_AMBULATORY_CARE_PROVIDER_SITE_OTHER): Payer: Medicare Other | Admitting: Vascular Surgery

## 2021-04-13 VITALS — BP 154/88 | HR 77 | Temp 97.8°F | Resp 16 | Ht 72.0 in | Wt 174.0 lb

## 2021-04-13 DIAGNOSIS — I7143 Infrarenal abdominal aortic aneurysm, without rupture: Secondary | ICD-10-CM

## 2021-04-13 NOTE — Progress Notes (Signed)
Patient name: Fernando Dean MRN: [id number] DOB: 1950-12-21 Sex: male  REASON FOR VISIT: Postop check after EVAR  HPI: Fernando Dean is a 71 y.o. male that underwent EVAR on 02/26/2021 for 5.8 cm AAA.  He presents for follow-up.  CTA was obtained on 04/09/2021 with successful endovascular stent graft exclusion of the aneurysm and no endoleak.  No significant abdominal or back pain.  Groins healed without issue.  No complaints today  Legs are doing fine.  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 MRI 2272 dual chamber PPM   Tuberculosis    as a child, was treated    Past Surgical History:  Procedure Laterality Date   ABDOMINAL AORTIC ENDOVASCULAR STENT GRAFT Bilateral 02/26/2021   Procedure: ABDOMINAL AORTIC ENDOVASCULAR STENT GRAFT REPAIR;  Surgeon: Marty Heck, MD;  Location: [facility] OR;  Service: Vascular;  Laterality: Bilateral;   BIOPSY  03/12/2021   Procedure: BIOPSY;  Surgeon: Harvel Quale, MD;  [facility];  Service: Gastroenterology;;   COLONOSCOPY WITH PROPOFOL N/A 03/12/2021   Procedure: COLONOSCOPY WITH PROPOFOL;  Surgeon: Harvel Quale, MD;  Location: [facility];  Service: Gastroenterology;  Laterality: N/A;  10:55   HEMOSTASIS CLIP PLACEMENT  03/12/2021   Procedure: HEMOSTASIS CLIP PLACEMENT;  Surgeon: Harvel Quale, MD;  [facility];  Service: Gastroenterology;;   HERNIA REPAIR Left 10/25/2006   PACEMAKER IMPLANT N/A 02/08/2021   Procedure: PACEMAKER IMPLANT;  Surgeon: Deboraha Sprang, MD;  Location: [place] [facility] LAB;  Service: Cardiovascular;  Laterality: N/A;   POLYPECTOMY  03/12/2021   Procedure: POLYPECTOMY;  Surgeon:  Harvel Quale, MD;  Location: [facility];  Service: Gastroenterology;;   [name]  03/12/2021   Procedure: [name];  Surgeon: [name], [name], MD;  Location: [facility];  Service: Gastroenterology;;    Family History  Problem Relation Age of Onset   Bladder Cancer Mother    Heart disease Father     SOCIAL HISTORY: Social History   Tobacco Use   Smoking status: Every Day    Packs/day: 0.75    Years: 50.00    Pack years: 37.50    Types: Cigarettes   Smokeless tobacco: Never  Substance Use Topics   Alcohol use: Yes    Alcohol/week: 2.0 - 3.0 standard drinks    Types: 2 - 3 Shots of liquor per week    Comment: daily liquor moderatly    Allergies  Allergen Reactions   Advil [Ibuprofen] Shortness Of Breath and Palpitations   Aleve [Naproxen] Shortness Of Breath   Nsaids Shortness Of Breath    Current Outpatient Medications  Medication Sig Dispense Refill   amLODipine (NORVASC) 10 MG tablet Take 1 tablet (10 mg total) by mouth daily. 30 tablet 3   aspirin EC 81 MG EC tablet Take 1 tablet (81 mg total) by mouth daily at 6 (six) AM. Swallow whole. 30 tablet 11   atorvastatin (LIPITOR) 40 MG tablet Take 1 tablet (40 mg total) by mouth daily. 90 tablet 3   carvedilol (COREG) 6.25 MG tablet Take 1 tablet (6.25 mg total) by mouth 2 (two) times  daily with a meal. 60 tablet 3   Multiple Vitamins-Minerals (MULTIVITAMIN WITH MINERALS) tablet Take 1 tablet by mouth daily. Centrum Silver for Men 50+     nicotine (NICODERM CQ - DOSED IN MG/24 HOURS) 21 mg/24hr patch Place 21 mg onto the skin daily. (Patient not taking: Reported on 04/13/2021)     No current facility-administered medications for this visit.    REVIEW OF SYSTEMS:  [X]  denotes positive finding, [ ]  denotes negative finding Cardiac  Comments:  Chest pain or chest pressure:    Shortness of breath upon exertion:    Short of breath when lying flat:    Irregular heart rhythm:         Vascular    Pain in calf, thigh, or hip brought on by ambulation:    Pain in feet at night that wakes you up from your sleep:     Blood clot in your veins:    Leg swelling:         Pulmonary    Oxygen at home:    Productive cough:     Wheezing:         Neurologic    Sudden weakness in arms or legs:     Sudden numbness in arms or legs:     Sudden onset of difficulty speaking or slurred speech:    Temporary loss of vision in one eye:     Problems with dizziness:         Gastrointestinal    Blood in stool:     Vomited blood:         Genitourinary    Burning when urinating:     Blood in urine:        Psychiatric    Major depression:         Hematologic    Bleeding problems:    Problems with blood clotting too easily:        Skin    Rashes or ulcers:        Constitutional    Fever or chills:      PHYSICAL EXAM: Vitals:   04/13/21 1455  BP: (!) 154/88  Pulse: 77  Resp: 16  Temp: 97.8 F (36.6 C)  TempSrc: Temporal  SpO2: 98%  Weight: 174 lb (78.9 kg)  Height: 6' (1.829 m)    GENERAL: The patient is a well-nourished male, in no acute distress. The vital signs are documented above. CARDIAC: There is a regular rate and rhythm.  VASCULAR:  Palpable femoral pulses bilaterally Palpable PT pulses bilateral PULMONARY: No respiratory distress. ABDOMEN: Soft and non-tender.  DATA:   CTA reviewed from 04/09/21 with excellent position of stent graft and no evidence of endoleak  Assessment/Plan:  71 year old male status post EVAR for a 5.8 cm abdominal aortic aneurysm on 02/26/2021.  CTA reviewed with good position of the stent graft 1 month postop and no endoleak.  Very satisfied with his results.  Palpable femoral and pedal pulses on exam.  I will see him in 6 months with EVAR duplex for ongoing surveillance.  I did mention there is a 7 mm pulmonary nodule in the right lower lobe stable that was noted on CT to the family.   Marty Heck, MD [facility] Office: [phone]

## 2021-04-14 ENCOUNTER — Ambulatory Visit: Payer: Medicare Other | Admitting: Cardiovascular Disease

## 2021-04-16 ENCOUNTER — Encounter: Payer: Self-pay | Admitting: Internal Medicine

## 2021-04-16 ENCOUNTER — Encounter (HOSPITAL_COMMUNITY): Payer: Self-pay

## 2021-04-16 ENCOUNTER — Other Ambulatory Visit (HOSPITAL_COMMUNITY): Payer: Self-pay

## 2021-04-16 NOTE — Patient Instructions (Addendum)
DUE TO COVID-19 ONLY ONE VISITOR IS ALLOWED TO COME WITH YOU AND STAY IN THE WAITING ROOM ONLY DURING PRE OP AND PROCEDURE DAY OF SURGERY.   Up to two visitors ages 16+ are allowed at one time in a patient's room.  The visitors may rotate out with other people throughout the day.  Additionally, up to two children between the ages of 68 and 38 are allowed and do not count toward the number of allowed visitors.  Children within this age range must be accompanied by an adult visitor.  One adult visitor may remain with the patient overnight and must be in the room by 8 PM.    YOU NEED TO HAVE A COVID 19 TEST ON 04/21/21 at 12:45 pm THIS TEST MUST BE DONE BEFORE SURGERY,   COVID TESTING SITE  IS AT  [facility] . COME IN THROUGH MAIN ENTRANCE BE SEATED IN THE LOBBY AREA TO THE RIGHT AS YOU COME IN THE MAIN ENTRANCE. YOU DO NOT NEED TO STOP AT THE DESK OR ADMITTING. DIAL [phone] ,GIVE THEM YOUR NAME AND LET THEM KNOW YOU ARE HERE FOR COVID TESTING    ONCE YOUR COVID TEST IS COMPLETED,  PLEASE Wear a mask when in [place]           Your procedure is scheduled on: 04/23/21   Report to [facility] Main  Entrance  Report to admitting   at  5:15 AM     Call this number if you have problems the morning of surgery [phone]   Follow all instructions from the surgeon's office for diet and bowel prep.    Drink plenty of fluids on the day of prep to prevent dehydration.   DRINK 2 PRESURGERY  DRINKS THE NIGHT BEFORE SURGERY AT 10:00 PM .   NO SOLIDS AFTER MIDNIGHT THE DAY PRIOR TO THE SURGERY.   NOTHING BY MOUTH EXCEPT CLEAR LIQUIDS UNTIL :4:30 AM  THREE HOURS PRIOR TO SCHEDULED SURGERY PLEASE FINISH THE  3rd  PRESURGICAL [place] .  NOTHING MORE BY MOUTH AFTER: 4:30 am  After Midnight you may have the following liquids until _0430 __ AM/ DAY OF SURGERY  Then NOTHING BY MOUTH  Water Black Coffee (sugar ok, NO MILK/CREAM OR CREAMERS)  Tea  (sugar ok, NO MILK/CREAM OR CREAMERS) regular and decaf Plain Jell-O (NO RED)                             Fruit ices (not with fruit pulp, NO RED) Popsicles (NO RED)                                  Juice: apple, WHITE grape, WHITE cranberry Sports drinks like Gatorade (NO RED) Clear broth(vegetable,chicken,beef)    Sample Menu Breakfast                                Lunch                                     Supper Cranberry juice                    Beef broth  Chicken broth Jell-O                                     Grape juice                           Apple juice Coffee or tea                        Jell-O                                      Popsicle                                                Coffee or tea                        Coffee or tea  _____________________________________________________________________    BRUSH YOUR TEETH MORNING OF SURGERY AND RINSE YOUR MOUTH OUT, NO CHEWING GUM CANDY OR MINTS.     Take these medicines the morning of surgery with A SIP OF WATER: Carvedilol,Amlodipine                                You may not have any metal on your body including hair  piercings .             Do not wear jewelry,  lotions, powders, deodorant or cologne.                                         Men may shave face and neck.   Do not bring valuables to the hospital. [place].   Contacts, dentures or bridgework may not be worn into surgery.   You may bring a small overnight bag with you                [place] - Preparing for Surgery Before surgery, you can play an important role.  Because skin is not sterile, your skin needs to be as free of germs as possible.  You can reduce the number of germs on your skin by washing with CHG (chlorahexidine gluconate) soap before surgery.  CHG is an antiseptic cleaner which kills germs and bonds with the skin to continue killing germs even after  washing. Please DO NOT use if you have an allergy to CHG or antibacterial soaps.   If your skin becomes reddened/irritated stop using . You may use Dial soap or any antibacterial soap instead.  Women :Do not shave (including legs and underarms) for at least 48 hours prior to the first CHG shower.   Men: You may shave your face/neck.  Please follow these instructions carefully:  1.  Shower with CHG Soap the night before surgery and the  morning of Surgery.  2.  If you choose to wash your hair, wash your hair first as usual with your  normal  shampoo.  3.  After you shampoo, rinse your hair and body thoroughly to remove the  shampoo.                            4.  Use CHG as you would any other liquid soap.  You can apply chg directly  to the skin and wash                       Gently with a scrungie or clean washcloth.  5.  Apply the CHG Soap to your body ONLY FROM THE NECK DOWN.   Do not use on face/ open                           Wound or open sores. Avoid contact with eyes, ears mouth and genitals (private parts).                       Wash face,  Genitals (private parts) with your normal soap.             6.  Wash thoroughly, paying special attention to the area where your surgery  will be performed.  7.  Thoroughly rinse your body with warm water from the neck down.  8.  DO NOT shower/wash with your normal soap after using and rinsing off  the CHG Soap.             9.  Pat yourself dry with a clean towel.            10.  Wear clean pajamas.            11.  Place clean sheets on your bed the night of your first shower and do not  sleep with pets.  Day of Surgery : Do not apply any lotions/deodorants the morning of surgery.  Please wear clean clothes to the hospital/surgery center.  FAILURE TO FOLLOW THESE INSTRUCTIONS MAY RESULT IN THE CANCELLATION OF YOUR SURGERY   PATIENT  SIGNATURE_________________________________    ________________________________________________________________________    Adam Phenix  An incentive spirometer is a tool that can help keep your lungs clear and active. This tool measures how well you are filling your lungs with each breath. Taking long deep breaths may help reverse or decrease the chance of developing breathing (pulmonary) problems (especially infection) following: A long period of time when you are unable to move or be active. BEFORE THE PROCEDURE  If the spirometer includes an indicator to show your best effort, your nurse or respiratory therapist will set it to a desired goal. If possible, sit up straight or lean slightly forward. Try not to slouch. Hold the incentive spirometer in an upright position. INSTRUCTIONS FOR USE  Sit on the edge of your bed if possible, or sit up as far as you can in bed or on a chair. Hold the incentive spirometer in an upright position. Breathe out normally. Place the mouthpiece in your mouth and seal your lips tightly around it. Breathe in slowly and as deeply as possible, raising the piston or the ball toward the top of the column. Hold your breath for 3-5 seconds or for as long as possible. Allow the piston or ball to fall to the bottom of the column. Remove the mouthpiece from your mouth and breathe out normally. Rest for a few seconds and repeat Steps 1 through 7 at least 10  times every 1-2 hours when you are awake. Take your time and take a few normal breaths between deep breaths. The spirometer may include an indicator to show your best effort. Use the indicator as a goal to work toward during each repetition. After each set of 10 deep breaths, practice coughing to be sure your lungs are clear. If you have an incision (the cut made at the time of surgery), support your incision when coughing by placing a pillow or rolled up towels firmly against it. Once you are able to get out  of bed, walk around indoors and cough well. You may stop using the incentive spirometer when instructed by your caregiver.  RISKS AND COMPLICATIONS Take your time so you do not get dizzy or light-headed. If you are in pain, you may need to take or ask for pain medication before doing incentive spirometry. It is harder to take a deep breath if you are having pain. AFTER USE Rest and breathe slowly and easily. It can be helpful to keep track of a log of your progress. Your caregiver can provide you with a simple table to help with this. If you are using the spirometer at home, follow these instructions: [place] IF:  You are having difficultly using the spirometer. You have trouble using the spirometer as often as instructed. Your pain medication is not giving enough relief while using the spirometer. You develop fever of 100.5 F (38.1 C) or higher. SEEK IMMEDIATE MEDICAL CARE IF:  You cough up bloody sputum that had not been present before. You develop fever of 102 F (38.9 C) or greater. You develop worsening pain at or near the incision site. MAKE SURE YOU:  Understand these instructions. Will watch your condition. Will get help right away if you are not doing well or get worse. Document Released: 06/20/2006 Document Revised: 05/02/2011 Document Reviewed: 08/21/2006 [facility] Patient Information 2014 [place], [place].   ________________________________________________________________________

## 2021-04-16 NOTE — Progress Notes (Signed)
PERIOPERATIVE PRESCRIPTION FOR IMPLANTED CARDIAC DEVICE PROGRAMMING  Patient Information: Name:  Fernando Dean  DOB:  Dec 19, 1950  MRN:  [id number]    Johny Drilling, RN  P Cv Div Heartcare Device Planned Procedure:  Robotic assisted laparoscopic partial colectomy  Surgeon:  Dr. Joyice Faster  Date of Procedure:  04/23/21  Cautery will be used. y  Position during surgery:  supine   Please send documentation back to:  [facility] (Fax # [phone])   Johny Drilling, RN  04/16/2021 1:49 PM  Device Information:  Clinic EP Physician:  Cristopher Peru, MD   Device Type:  Pacemaker Manufacturer and Phone #:  St. Jude/Abbott: [phone] Pacemaker Dependent?:  Yes.   Date of Last Device Check:  02/18/21 in clinic Normal Device Function?:  Yes.    Electrophysiologist's Recommendations:  Have magnet available. Provide continuous ECG monitoring when magnet is used or reprogramming is to be performed.  Procedure may interfere with device function.  Magnet should be placed over device during procedure.  Per Device Clinic Standing Orders, [name] [place], [place]  3:14 PM 04/16/2021

## 2021-04-19 ENCOUNTER — Encounter (HOSPITAL_COMMUNITY): Payer: Self-pay

## 2021-04-19 ENCOUNTER — Other Ambulatory Visit: Payer: Self-pay

## 2021-04-19 ENCOUNTER — Encounter (HOSPITAL_COMMUNITY)
Admission: RE | Admit: 2021-04-19 | Discharge: 2021-04-19 | Disposition: A | Discharge status: Home / Self Care | Payer: Medicare Other | Source: Ambulatory Visit | Attending: General Surgery | Admitting: General Surgery

## 2021-04-19 VITALS — BP 148/71 | HR 60 | Temp 98.7°F | Resp 16 | Ht 72.0 in | Wt 172.0 lb

## 2021-04-19 DIAGNOSIS — F17210 Nicotine dependence, cigarettes, uncomplicated: Secondary | ICD-10-CM | POA: Diagnosis not present

## 2021-04-19 DIAGNOSIS — Z01818 Encounter for other preprocedural examination: Secondary | ICD-10-CM

## 2021-04-19 DIAGNOSIS — Z950 Presence of cardiac pacemaker: Secondary | ICD-10-CM | POA: Diagnosis not present

## 2021-04-19 DIAGNOSIS — N183 Chronic kidney disease, stage 3 unspecified: Secondary | ICD-10-CM | POA: Insufficient documentation

## 2021-04-19 DIAGNOSIS — Z01812 Encounter for preprocedural laboratory examination: Secondary | ICD-10-CM | POA: Diagnosis not present

## 2021-04-19 DIAGNOSIS — C189 Malignant neoplasm of colon, unspecified: Secondary | ICD-10-CM | POA: Diagnosis not present

## 2021-04-19 DIAGNOSIS — I714 Abdominal aortic aneurysm, without rupture, unspecified: Secondary | ICD-10-CM | POA: Diagnosis not present

## 2021-04-19 DIAGNOSIS — I129 Hypertensive chronic kidney disease with stage 1 through stage 4 chronic kidney disease, or unspecified chronic kidney disease: Secondary | ICD-10-CM | POA: Diagnosis not present

## 2021-04-19 DIAGNOSIS — I10 Essential (primary) hypertension: Secondary | ICD-10-CM

## 2021-04-19 HISTORY — DX: Other specified diseases of intestine: K63.89

## 2021-04-19 LAB — CBC
HCT: 37.1 % — ABNORMAL LOW (ref 39.0–52.0)
Hemoglobin: 11.6 g/dL — ABNORMAL LOW (ref 13.0–17.0)
MCH: 26.4 pg (ref 26.0–34.0)
MCHC: 31.3 g/dL (ref 30.0–36.0)
MCV: 84.5 fL (ref 80.0–100.0)
Platelets: 305 10*3/uL (ref 150–400)
RBC: 4.39 MIL/uL (ref 4.22–5.81)
RDW: 18.3 % — ABNORMAL HIGH (ref 11.5–15.5)
WBC: 8.8 10*3/uL (ref 4.0–10.5)
nRBC: 0 % (ref 0.0–0.2)

## 2021-04-19 LAB — BASIC METABOLIC PANEL
Anion gap: 6 (ref 5–15)
BUN: 27 mg/dL — ABNORMAL HIGH (ref 8–23)
CO2: 23 mmol/L (ref 22–32)
Calcium: 9.6 mg/dL (ref 8.9–10.3)
Chloride: 105 mmol/L (ref 98–111)
Creatinine, Ser: 1.82 mg/dL — ABNORMAL HIGH (ref 0.61–1.24)
GFR, Estimated: 39 mL/min — ABNORMAL LOW (ref >60)
Glucose, Bld: 116 mg/dL — ABNORMAL HIGH (ref 70–99)
Potassium: 4.8 mmol/L (ref 3.5–5.1)
Sodium: 134 mmol/L — ABNORMAL LOW (ref 135–145)

## 2021-04-19 NOTE — Progress Notes (Addendum)
COVID swab appointment:  3-1-3 @ 12:45 PM  COVID Vaccine Completed:  Yes x2 Date COVID Vaccine completed: Has received booster:  Yes x1 COVID vaccine manufacturer:    Moderna     Date of COVID positive in last 90 days:  No  PCP - Wende Neighbors, MD Cardiologist -  Lenna Sciara, MD  Cardiac clearance in Epic dated 03-25-21 Lenna Sciara, MD  Chest x-ray - 02-26-21 Epic EKG - 03-25-21 Epic Stress Test -  ECHO - 02-05-21 Epic Cardiac Cath -  Pacemaker/ICD device last checked: 02-18-21 Epic Spinal Cord Stimulator:  Bowel Prep -  Patient has bowel prep and instructions  Sleep Study - N/A CPAP -   Fasting Blood Sugar - N/A Checks Blood Sugar _____ times a day  Blood Thinner Instructions: Aspirin Instructions:  ASA 81 mg.    Patient to check to see if he needs to stop Last Dose:  Activity level:   Can go up a flight of stairs and perform activities of daily living without stopping and without symptoms of chest pain or shortness of breath.  Anesthesia review: Pacemaker, AAA, HTN  Patient denies shortness of breath, fever, cough and chest pain at PAT appointment  Patient verbalized understanding of instructions that were given to them at the PAT appointment. Patient was also instructed that they will need to review over the PAT instructions again at home before surgery.

## 2021-04-20 ENCOUNTER — Other Ambulatory Visit: Payer: Self-pay | Admitting: *Deleted

## 2021-04-20 DIAGNOSIS — I7143 Infrarenal abdominal aortic aneurysm, without rupture: Secondary | ICD-10-CM

## 2021-04-20 NOTE — Progress Notes (Signed)
Anesthesia Chart Review   Case: [id number] Date/Time: 04/23/21 0715   Procedure: XI ROBOT ASSISTED LAPAROSCOPIC PARTIAL COLECTOMY   Anesthesia type: General   Pre-op diagnosis: colon cancer   [facility]: [facility] ROOM 03 / [facility]   Surgeons: Leighton Ruff, MD       XAJOINOMVE:71 y.o. smoker with h/o HTN, CKD Stage III, EVAR on 02/26/2021 for 5.8 cm AAA, pacemaker in place (device orders in 04/16/2021 progress note), colon cancer scheduled for above procedure 0/9/71 with Dr. Leighton Ruff.   Pt last seen by cardiology 03/25/21. Per OV note, "The patient underwent general anesthesia last month for a vascular surgery procedure without cardiovascular complications.  His echocardiogram demonstrated normal function.  No further cardiac evaluation is required prior to colectomy.  Follow-up in in 6 months"  Last seen by vascular surgeon 04/13/21, stable at this visit.   Anticipate pt can proceed with planned procedure barring acute status change.   VS: BP (!) 148/71    Pulse 60    Temp 37.1 C (Oral)    Resp 16    Ht 6' (1.829 m)    Wt 78 kg    SpO2 99%    BMI 23.33 kg/m   PROVIDERS: Lindell Spar, MD is PCP   Lenna Sciara, MD is Cardiologist   Monica Martinez, MD is Vascular Surgeon LABS: Labs reviewed: Acceptable for surgery. (all labs ordered are listed, but only abnormal results are displayed)  Labs Reviewed  BASIC METABOLIC PANEL - Abnormal; Notable for the following components:      Result Value   Sodium 134 (*)    Glucose, Bld 116 (*)    BUN 27 (*)    Creatinine, Ser 1.82 (*)    GFR, Estimated 39 (*)    All other components within normal limits  CBC - Abnormal; Notable for the following components:   Hemoglobin 11.6 (*)    HCT 37.1 (*)    RDW 18.3 (*)    All other components within normal limits  TYPE AND SCREEN     IMAGES:   EKG: 03/25/21 Rate 61 bpm  SR LAFB RBBB PVC  CV: Echo 02/05/2021  1. Left ventricular ejection fraction, by estimation, is 60 to 65%. The   left ventricle has normal function. The left ventricle has no regional  wall motion abnormalities. There is mild left ventricular hypertrophy.  Left ventricular diastolic parameters  are indeterminate.   2. Right ventricular systolic function is normal. The right ventricular  size is not well visualized. There is mildly elevated pulmonary artery  systolic pressure.   3. Left atrial size was severely dilated.   4. Right atrial size was moderately dilated.   5. The mitral valve is grossly normal. Trivial mitral valve  regurgitation.   6. AI jet against the anterior mitral valve leaflet. Jet eccentric P1/2T  may underestimate. The aortic valve is calcified. Aortic valve  regurgitation is moderate.   7. The inferior vena cava is normal in size with greater than 50%  respiratory variability, suggesting right atrial pressure of 3 mmHg.  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 MRI 2272  dual chamber PPM   Tuberculosis    as a child, was treated    Past Surgical History:  Procedure Laterality Date   ABDOMINAL AORTIC ENDOVASCULAR STENT GRAFT Bilateral 02/26/2021   Procedure: ABDOMINAL AORTIC ENDOVASCULAR STENT GRAFT REPAIR;  Surgeon: Marty Heck, MD;  [facility] OR;  Service: Vascular;  Laterality: Bilateral;   BIOPSY  03/12/2021   Procedure: BIOPSY;  Surgeon: Harvel Quale, MD;  [facility];  Service: Gastroenterology;;   COLONOSCOPY WITH PROPOFOL N/A 03/12/2021   Procedure: COLONOSCOPY WITH PROPOFOL;  Surgeon: Harvel Quale, MD;  [facility];  Service: Gastroenterology;  Laterality: N/A;  10:55   HEMOSTASIS CLIP PLACEMENT  03/12/2021    Procedure: HEMOSTASIS CLIP PLACEMENT;  Surgeon: Harvel Quale, MD;  [facility];  Service: Gastroenterology;;   HERNIA REPAIR Left 10/25/2006   PACEMAKER IMPLANT N/A 02/08/2021   Procedure: PACEMAKER IMPLANT;  Surgeon: Deboraha Sprang, MD;  Location: [place] [facility] LAB;  Service: Cardiovascular;  Laterality: N/A;   POLYPECTOMY  03/12/2021   Procedure: POLYPECTOMY;  Surgeon: Harvel Quale, MD;  [facility];  Service: Gastroenterology;;   [name]  03/12/2021   Procedure: [name];  Surgeon: [name], [name], MD;  [facility];  Service: Gastroenterology;;    MEDICATIONS:  amLODipine (NORVASC) 10 MG tablet   aspirin EC 81 MG EC tablet   atorvastatin (LIPITOR) 40 MG tablet   carvedilol (COREG) 6.25 MG tablet   Multiple Vitamins-Minerals (MULTIVITAMIN WITH MINERALS) tablet   nicotine (NICODERM CQ - DOSED IN MG/24 HOURS) 21 mg/24hr patch   No current facility-administered medications for this encounter.    Fernando Felix Ward, PA-C [facility] Pre-Surgical Testing [phone]

## 2021-04-20 NOTE — Anesthesia Preprocedure Evaluation (Addendum)
Anesthesia Evaluation  Patient identified by MRN, date of birth, ID band Patient awake    Reviewed: Allergy & Precautions, H&P , NPO status , Patient's Chart, lab work & pertinent test results  Airway Mallampati: II   Neck ROM: full    Dental   Pulmonary Current Smoker,    breath sounds clear to auscultation       Cardiovascular hypertension, + Peripheral Vascular Disease  + pacemaker + Valvular Problems/Murmurs AI  Rhythm:regular Rate:Normal  Normal LV function. Moderate AI.  H/o EVAR for 5.8 cm AAA   Neuro/Psych    GI/Hepatic (+)     substance abuse  alcohol use, Colon CA   Endo/Other    Renal/GU Renal InsufficiencyRenal disease     Musculoskeletal   Abdominal   Peds  Hematology   Anesthesia Other Findings   Reproductive/Obstetrics                            Anesthesia Physical Anesthesia Plan  ASA: 3  Anesthesia Plan: General   Post-op Pain Management:    Induction: Intravenous  PONV Risk Score and Plan: 1 and Ondansetron, Dexamethasone and Treatment may vary due to age or medical condition  Airway Management Planned: Oral ETT  Additional Equipment:   Intra-op Plan:   Post-operative Plan: Extubation in OR  Informed Consent: I have reviewed the patients History and Physical, chart, labs and discussed the procedure including the risks, benefits and alternatives for the proposed anesthesia with the patient or authorized representative who has indicated his/her understanding and acceptance.     Dental advisory given  Plan Discussed with: CRNA, Anesthesiologist and Surgeon  Anesthesia Plan Comments: (See PAT note 04/19/2021, Konrad Felix Ward, PA-C)       Anesthesia Quick Evaluation

## 2021-04-21 ENCOUNTER — Other Ambulatory Visit: Payer: Self-pay

## 2021-04-21 ENCOUNTER — Other Ambulatory Visit (HOSPITAL_COMMUNITY): Payer: Self-pay

## 2021-04-21 ENCOUNTER — Encounter (HOSPITAL_COMMUNITY)
Admission: RE | Admit: 2021-04-21 | Discharge: 2021-04-21 | Disposition: A | Discharge status: Home / Self Care | Payer: Medicare Other | Source: Ambulatory Visit | Attending: General Surgery | Admitting: General Surgery

## 2021-04-21 DIAGNOSIS — Z01812 Encounter for preprocedural laboratory examination: Secondary | ICD-10-CM | POA: Insufficient documentation

## 2021-04-21 DIAGNOSIS — Z8249 Family history of ischemic heart disease and other diseases of the circulatory system: Secondary | ICD-10-CM | POA: Diagnosis not present

## 2021-04-21 DIAGNOSIS — I459 Conduction disorder, unspecified: Secondary | ICD-10-CM | POA: Diagnosis not present

## 2021-04-21 DIAGNOSIS — N289 Disorder of kidney and ureter, unspecified: Secondary | ICD-10-CM | POA: Diagnosis not present

## 2021-04-21 DIAGNOSIS — I2510 Atherosclerotic heart disease of native coronary artery without angina pectoris: Secondary | ICD-10-CM | POA: Diagnosis not present

## 2021-04-21 DIAGNOSIS — Z20822 Contact with and (suspected) exposure to covid-19: Secondary | ICD-10-CM | POA: Insufficient documentation

## 2021-04-21 DIAGNOSIS — N182 Chronic kidney disease, stage 2 (mild): Secondary | ICD-10-CM | POA: Diagnosis not present

## 2021-04-21 DIAGNOSIS — C189 Malignant neoplasm of colon, unspecified: Secondary | ICD-10-CM | POA: Diagnosis not present

## 2021-04-21 DIAGNOSIS — Z7982 Long term (current) use of aspirin: Secondary | ICD-10-CM | POA: Diagnosis not present

## 2021-04-21 DIAGNOSIS — Z8679 Personal history of other diseases of the circulatory system: Secondary | ICD-10-CM | POA: Diagnosis not present

## 2021-04-21 DIAGNOSIS — Z823 Family history of stroke: Secondary | ICD-10-CM | POA: Diagnosis not present

## 2021-04-21 DIAGNOSIS — K6389 Other specified diseases of intestine: Secondary | ICD-10-CM | POA: Diagnosis not present

## 2021-04-21 DIAGNOSIS — F17210 Nicotine dependence, cigarettes, uncomplicated: Secondary | ICD-10-CM | POA: Diagnosis not present

## 2021-04-21 DIAGNOSIS — Z79899 Other long term (current) drug therapy: Secondary | ICD-10-CM | POA: Diagnosis not present

## 2021-04-21 DIAGNOSIS — I129 Hypertensive chronic kidney disease with stage 1 through stage 4 chronic kidney disease, or unspecified chronic kidney disease: Secondary | ICD-10-CM | POA: Diagnosis not present

## 2021-04-21 DIAGNOSIS — Z01818 Encounter for other preprocedural examination: Secondary | ICD-10-CM

## 2021-04-21 DIAGNOSIS — Z950 Presence of cardiac pacemaker: Secondary | ICD-10-CM | POA: Diagnosis not present

## 2021-04-21 DIAGNOSIS — Z886 Allergy status to analgesic agent status: Secondary | ICD-10-CM | POA: Diagnosis not present

## 2021-04-21 DIAGNOSIS — Z800 Family history of malignant neoplasm of digestive organs: Secondary | ICD-10-CM | POA: Diagnosis not present

## 2021-04-21 DIAGNOSIS — I739 Peripheral vascular disease, unspecified: Secondary | ICD-10-CM | POA: Diagnosis not present

## 2021-04-21 DIAGNOSIS — E7800 Pure hypercholesterolemia, unspecified: Secondary | ICD-10-CM | POA: Diagnosis not present

## 2021-04-21 DIAGNOSIS — C185 Malignant neoplasm of splenic flexure: Secondary | ICD-10-CM | POA: Diagnosis not present

## 2021-04-21 DIAGNOSIS — I10 Essential (primary) hypertension: Secondary | ICD-10-CM | POA: Diagnosis not present

## 2021-04-21 LAB — SARS CORONAVIRUS 2 (TAT 6-24 HRS): SARS Coronavirus 2: NEGATIVE

## 2021-04-22 ENCOUNTER — Encounter (HOSPITAL_COMMUNITY): Payer: Self-pay | Admitting: General Surgery

## 2021-04-23 ENCOUNTER — Encounter (HOSPITAL_COMMUNITY)
Admission: RE | Disposition: A | Discharge status: Home / Self Care | Payer: Self-pay | Source: Home / Self Care | Attending: General Surgery

## 2021-04-23 ENCOUNTER — Other Ambulatory Visit: Payer: Self-pay

## 2021-04-23 ENCOUNTER — Inpatient Hospital Stay (HOSPITAL_COMMUNITY)
Admission: RE | Admit: 2021-04-23 | Discharge: 2021-04-26 | DRG: 331 | Disposition: A | Discharge status: Home / Self Care | Payer: Medicare Other | Attending: General Surgery | Admitting: General Surgery

## 2021-04-23 ENCOUNTER — Encounter (HOSPITAL_COMMUNITY): Payer: Self-pay | Admitting: General Surgery

## 2021-04-23 ENCOUNTER — Inpatient Hospital Stay (HOSPITAL_COMMUNITY): Payer: Medicare Other | Admitting: Anesthesiology

## 2021-04-23 ENCOUNTER — Inpatient Hospital Stay (HOSPITAL_COMMUNITY): Payer: Medicare Other | Admitting: Physician Assistant

## 2021-04-23 DIAGNOSIS — Z8249 Family history of ischemic heart disease and other diseases of the circulatory system: Secondary | ICD-10-CM | POA: Diagnosis not present

## 2021-04-23 DIAGNOSIS — I2510 Atherosclerotic heart disease of native coronary artery without angina pectoris: Secondary | ICD-10-CM | POA: Diagnosis present

## 2021-04-23 DIAGNOSIS — Z800 Family history of malignant neoplasm of digestive organs: Secondary | ICD-10-CM

## 2021-04-23 DIAGNOSIS — K6389 Other specified diseases of intestine: Secondary | ICD-10-CM

## 2021-04-23 DIAGNOSIS — Z7982 Long term (current) use of aspirin: Secondary | ICD-10-CM | POA: Diagnosis not present

## 2021-04-23 DIAGNOSIS — Z8679 Personal history of other diseases of the circulatory system: Secondary | ICD-10-CM

## 2021-04-23 DIAGNOSIS — I10 Essential (primary) hypertension: Secondary | ICD-10-CM | POA: Diagnosis present

## 2021-04-23 DIAGNOSIS — Z886 Allergy status to analgesic agent status: Secondary | ICD-10-CM

## 2021-04-23 DIAGNOSIS — I459 Conduction disorder, unspecified: Secondary | ICD-10-CM | POA: Diagnosis present

## 2021-04-23 DIAGNOSIS — I129 Hypertensive chronic kidney disease with stage 1 through stage 4 chronic kidney disease, or unspecified chronic kidney disease: Secondary | ICD-10-CM | POA: Diagnosis present

## 2021-04-23 DIAGNOSIS — C185 Malignant neoplasm of splenic flexure: Secondary | ICD-10-CM | POA: Diagnosis present

## 2021-04-23 DIAGNOSIS — I442 Atrioventricular block, complete: Secondary | ICD-10-CM | POA: Diagnosis present

## 2021-04-23 DIAGNOSIS — E7800 Pure hypercholesterolemia, unspecified: Secondary | ICD-10-CM | POA: Diagnosis present

## 2021-04-23 DIAGNOSIS — Z950 Presence of cardiac pacemaker: Secondary | ICD-10-CM

## 2021-04-23 DIAGNOSIS — N289 Disorder of kidney and ureter, unspecified: Secondary | ICD-10-CM

## 2021-04-23 DIAGNOSIS — I739 Peripheral vascular disease, unspecified: Secondary | ICD-10-CM | POA: Diagnosis not present

## 2021-04-23 DIAGNOSIS — Z20822 Contact with and (suspected) exposure to covid-19: Secondary | ICD-10-CM | POA: Diagnosis present

## 2021-04-23 DIAGNOSIS — N183 Chronic kidney disease, stage 3 unspecified: Secondary | ICD-10-CM | POA: Diagnosis present

## 2021-04-23 DIAGNOSIS — Z79899 Other long term (current) drug therapy: Secondary | ICD-10-CM | POA: Diagnosis not present

## 2021-04-23 DIAGNOSIS — N182 Chronic kidney disease, stage 2 (mild): Secondary | ICD-10-CM | POA: Diagnosis present

## 2021-04-23 DIAGNOSIS — F17210 Nicotine dependence, cigarettes, uncomplicated: Secondary | ICD-10-CM | POA: Diagnosis present

## 2021-04-23 DIAGNOSIS — Z823 Family history of stroke: Secondary | ICD-10-CM

## 2021-04-23 LAB — TYPE AND SCREEN
ABO/RH(D): O POS
Antibody Screen: NEGATIVE

## 2021-04-23 SURGERY — COLECTOMY, PARTIAL, ROBOT-ASSISTED, LAPAROSCOPIC
Anesthesia: General

## 2021-04-23 MED ORDER — BUPIVACAINE-EPINEPHRINE (PF) 0.25% -1:200000 IJ SOLN
INTRAMUSCULAR | Status: AC
  Filled 2021-04-23: qty 30

## 2021-04-23 MED ORDER — ALVIMOPAN 12 MG PO CAPS
12.0000 mg | ORAL_CAPSULE | Freq: Two times a day (BID) | ORAL | Status: DC
  Administered 2021-04-24: 12 mg via ORAL
  Filled 2021-04-23: qty 1

## 2021-04-23 MED ORDER — PROPOFOL 10 MG/ML IV BOLUS
INTRAVENOUS | Status: AC
  Filled 2021-04-23: qty 20

## 2021-04-23 MED ORDER — ONDANSETRON HCL 4 MG/2ML IJ SOLN: 4.0000 mg | Freq: Four times a day (QID) | INTRAMUSCULAR | Status: DC | PRN

## 2021-04-23 MED ORDER — FENTANYL CITRATE (PF) 100 MCG/2ML IJ SOLN
INTRAMUSCULAR | Status: DC | PRN
Start: 2021-04-23 — End: 2021-04-23
  Administered 2021-04-23: 50 ug via INTRAVENOUS
  Administered 2021-04-23: 100 ug via INTRAVENOUS

## 2021-04-23 MED ORDER — ONDANSETRON HCL 4 MG PO TABS: 4.0000 mg | ORAL_TABLET | Freq: Four times a day (QID) | ORAL | Status: DC | PRN

## 2021-04-23 MED ORDER — ENSURE PRE-SURGERY PO LIQD: 592.0000 mL | Freq: Once | ORAL | Status: DC

## 2021-04-23 MED ORDER — FENTANYL CITRATE PF 50 MCG/ML IJ SOSY: 25.0000 ug | PREFILLED_SYRINGE | INTRAMUSCULAR | Status: DC | PRN

## 2021-04-23 MED ORDER — ALUM & MAG HYDROXIDE-SIMETH 200-200-20 MG/5ML PO SUSP: 30.0000 mL | Freq: Four times a day (QID) | ORAL | Status: DC | PRN

## 2021-04-23 MED ORDER — DEXMEDETOMIDINE (PRECEDEX) IN NS 20 MCG/5ML (4 MCG/ML) IV SYRINGE
PREFILLED_SYRINGE | INTRAVENOUS | Status: DC | PRN
  Administered 2021-04-23 (×3): 4 ug via INTRAVENOUS

## 2021-04-23 MED ORDER — FENTANYL CITRATE (PF) 100 MCG/2ML IJ SOLN
INTRAMUSCULAR | Status: AC
  Filled 2021-04-23: qty 2

## 2021-04-23 MED ORDER — SODIUM CHLORIDE 0.9 % IV SOLN
2.0000 g | Freq: Two times a day (BID) | INTRAVENOUS | Status: AC
  Administered 2021-04-23: 2 g via INTRAVENOUS
  Filled 2021-04-23: qty 2

## 2021-04-23 MED ORDER — KCL IN DEXTROSE-NACL 20-5-0.45 MEQ/L-%-% IV SOLN
INTRAVENOUS | Status: DC
  Filled 2021-04-23: qty 1000

## 2021-04-23 MED ORDER — ACETAMINOPHEN 500 MG PO TABS
1000.0000 mg | ORAL_TABLET | Freq: Four times a day (QID) | ORAL | Status: DC
  Administered 2021-04-23 – 2021-04-26 (×11): 1000 mg via ORAL
  Filled 2021-04-23 (×11): qty 2

## 2021-04-23 MED ORDER — DEXAMETHASONE SODIUM PHOSPHATE 10 MG/ML IJ SOLN
INTRAMUSCULAR | Status: AC
  Filled 2021-04-23: qty 2

## 2021-04-23 MED ORDER — PHENYLEPHRINE HCL-NACL 20-0.9 MG/250ML-% IV SOLN
INTRAVENOUS | Status: AC
  Filled 2021-04-23: qty 250

## 2021-04-23 MED ORDER — ROCURONIUM BROMIDE 10 MG/ML (PF) SYRINGE
PREFILLED_SYRINGE | INTRAVENOUS | Status: AC
  Filled 2021-04-23: qty 10

## 2021-04-23 MED ORDER — GABAPENTIN 300 MG PO CAPS
300.0000 mg | ORAL_CAPSULE | ORAL | Status: AC
  Administered 2021-04-23: 300 mg via ORAL
  Filled 2021-04-23: qty 1

## 2021-04-23 MED ORDER — LACTATED RINGERS IV SOLN: INTRAVENOUS | Status: DC | PRN

## 2021-04-23 MED ORDER — PROPOFOL 10 MG/ML IV BOLUS
INTRAVENOUS | Status: DC | PRN
Start: 2021-04-23 — End: 2021-04-23
  Administered 2021-04-23: 110 mg via INTRAVENOUS

## 2021-04-23 MED ORDER — ENSURE SURGERY PO LIQD
237.0000 mL | Freq: Two times a day (BID) | ORAL | Status: DC
  Administered 2021-04-24 – 2021-04-26 (×4): 237 mL via ORAL

## 2021-04-23 MED ORDER — GABAPENTIN 300 MG PO CAPS
300.0000 mg | ORAL_CAPSULE | Freq: Two times a day (BID) | ORAL | Status: DC
  Administered 2021-04-23 – 2021-04-26 (×6): 300 mg via ORAL
  Filled 2021-04-23 (×6): qty 1

## 2021-04-23 MED ORDER — OXYCODONE HCL 5 MG PO TABS
ORAL_TABLET | ORAL | Status: AC
  Filled 2021-04-23: qty 1

## 2021-04-23 MED ORDER — POLYETHYLENE GLYCOL 3350 17 GM/SCOOP PO POWD: 1.0000 | Freq: Once | ORAL | Status: DC

## 2021-04-23 MED ORDER — HEPARIN SODIUM (PORCINE) 5000 UNIT/ML IJ SOLN
5000.0000 [IU] | Freq: Once | INTRAMUSCULAR | Status: AC
  Administered 2021-04-23: 5000 [IU] via SUBCUTANEOUS
  Filled 2021-04-23: qty 1

## 2021-04-23 MED ORDER — SACCHAROMYCES BOULARDII 250 MG PO CAPS
250.0000 mg | ORAL_CAPSULE | Freq: Two times a day (BID) | ORAL | Status: DC
  Administered 2021-04-23 – 2021-04-24 (×3): 250 mg via ORAL
  Filled 2021-04-23 (×3): qty 1

## 2021-04-23 MED ORDER — DIPHENHYDRAMINE HCL 12.5 MG/5ML PO ELIX: 12.5000 mg | ORAL_SOLUTION | Freq: Four times a day (QID) | ORAL | Status: DC | PRN

## 2021-04-23 MED ORDER — BISACODYL 5 MG PO TBEC: 20.0000 mg | DELAYED_RELEASE_TABLET | Freq: Once | ORAL | Status: DC

## 2021-04-23 MED ORDER — ENOXAPARIN SODIUM 40 MG/0.4ML IJ SOSY
40.0000 mg | PREFILLED_SYRINGE | INTRAMUSCULAR | Status: DC
  Administered 2021-04-24 – 2021-04-26 (×3): 40 mg via SUBCUTANEOUS
  Filled 2021-04-23 (×3): qty 0.4

## 2021-04-23 MED ORDER — LIDOCAINE 2% (20 MG/ML) 5 ML SYRINGE
INTRAMUSCULAR | Status: DC | PRN
  Administered 2021-04-23: 100 mg via INTRAVENOUS

## 2021-04-23 MED ORDER — BUPIVACAINE-EPINEPHRINE 0.25% -1:200000 IJ SOLN
INTRAMUSCULAR | Status: DC | PRN
  Administered 2021-04-23: 30 mL

## 2021-04-23 MED ORDER — DEXAMETHASONE SODIUM PHOSPHATE 10 MG/ML IJ SOLN
INTRAMUSCULAR | Status: DC | PRN
  Administered 2021-04-23: 8 mg via INTRAVENOUS

## 2021-04-23 MED ORDER — SUGAMMADEX SODIUM 200 MG/2ML IV SOLN
INTRAVENOUS | Status: DC | PRN
Start: 2021-04-23 — End: 2021-04-23
  Administered 2021-04-23: 200 mg via INTRAVENOUS

## 2021-04-23 MED ORDER — HYDROMORPHONE HCL 1 MG/ML IJ SOLN
0.5000 mg | INTRAMUSCULAR | Status: DC | PRN
  Administered 2021-04-23 – 2021-04-24 (×4): 0.5 mg via INTRAVENOUS
  Filled 2021-04-23 (×4): qty 0.5

## 2021-04-23 MED ORDER — ALVIMOPAN 12 MG PO CAPS
12.0000 mg | ORAL_CAPSULE | ORAL | Status: AC
  Administered 2021-04-23: 12 mg via ORAL
  Filled 2021-04-23: qty 1

## 2021-04-23 MED ORDER — SODIUM CHLORIDE 0.9 % IV SOLN
2.0000 g | INTRAVENOUS | Status: AC
  Administered 2021-04-23: 2 g via INTRAVENOUS
  Filled 2021-04-23: qty 2

## 2021-04-23 MED ORDER — BUPIVACAINE LIPOSOME 1.3 % IJ SUSP
INTRAMUSCULAR | Status: AC
  Filled 2021-04-23: qty 20

## 2021-04-23 MED ORDER — ENSURE PRE-SURGERY PO LIQD: 296.0000 mL | Freq: Once | ORAL | Status: DC

## 2021-04-23 MED ORDER — LACTATED RINGERS IV SOLN: INTRAVENOUS | Status: DC

## 2021-04-23 MED ORDER — SPY AGENT GREEN - (INDOCYANINE FOR INJECTION)
INTRAMUSCULAR | Status: DC | PRN
  Administered 2021-04-23: 2 mL via INTRAVENOUS

## 2021-04-23 MED ORDER — ROCURONIUM BROMIDE 10 MG/ML (PF) SYRINGE
PREFILLED_SYRINGE | INTRAVENOUS | Status: DC | PRN
  Administered 2021-04-23 (×2): 10 mg via INTRAVENOUS
  Administered 2021-04-23: 60 mg via INTRAVENOUS
  Administered 2021-04-23: 20 mg via INTRAVENOUS

## 2021-04-23 MED ORDER — DIPHENHYDRAMINE HCL 50 MG/ML IJ SOLN: 12.5000 mg | Freq: Four times a day (QID) | INTRAMUSCULAR | Status: DC | PRN

## 2021-04-23 MED ORDER — OXYCODONE HCL 5 MG PO TABS
5.0000 mg | ORAL_TABLET | Freq: Once | ORAL | Status: AC | PRN
  Administered 2021-04-23: 5 mg via ORAL

## 2021-04-23 MED ORDER — AMLODIPINE BESYLATE 10 MG PO TABS
10.0000 mg | ORAL_TABLET | Freq: Every day | ORAL | Status: DC
  Administered 2021-04-24 – 2021-04-26 (×3): 10 mg via ORAL
  Filled 2021-04-23 (×3): qty 1

## 2021-04-23 MED ORDER — LACTATED RINGERS IR SOLN
Status: DC | PRN
Start: 2021-04-23 — End: 2021-04-23
  Administered 2021-04-23: 1000 mL

## 2021-04-23 MED ORDER — ACETAMINOPHEN 500 MG PO TABS
1000.0000 mg | ORAL_TABLET | ORAL | Status: AC
  Administered 2021-04-23: 1000 mg via ORAL
  Filled 2021-04-23: qty 2

## 2021-04-23 MED ORDER — 0.9 % SODIUM CHLORIDE (POUR BTL) OPTIME
TOPICAL | Status: DC | PRN
  Administered 2021-04-23: 2000 mL

## 2021-04-23 MED ORDER — MIDAZOLAM HCL 5 MG/5ML IJ SOLN
INTRAMUSCULAR | Status: DC | PRN
Start: 2021-04-23 — End: 2021-04-23
  Administered 2021-04-23: 1 mg via INTRAVENOUS

## 2021-04-23 MED ORDER — OXYCODONE HCL 5 MG/5ML PO SOLN: 5.0000 mg | Freq: Once | ORAL | Status: AC | PRN

## 2021-04-23 MED ORDER — ONDANSETRON HCL 4 MG/2ML IJ SOLN
INTRAMUSCULAR | Status: AC
  Filled 2021-04-23: qty 4

## 2021-04-23 MED ORDER — ORAL CARE MOUTH RINSE: 15.0000 mL | Freq: Once | OROMUCOSAL | Status: AC

## 2021-04-23 MED ORDER — ONDANSETRON HCL 4 MG/2ML IJ SOLN
INTRAMUSCULAR | Status: DC | PRN
  Administered 2021-04-23: 4 mg via INTRAVENOUS

## 2021-04-23 MED ORDER — CHLORHEXIDINE GLUCONATE 0.12 % MT SOLN
15.0000 mL | Freq: Once | OROMUCOSAL | Status: AC
  Administered 2021-04-23: 15 mL via OROMUCOSAL

## 2021-04-23 MED ORDER — BUPIVACAINE LIPOSOME 1.3 % IJ SUSP
INTRAMUSCULAR | Status: DC | PRN
  Administered 2021-04-23: 20 mL

## 2021-04-23 MED ORDER — BUPIVACAINE LIPOSOME 1.3 % IJ SUSP: 20.0000 mL | Freq: Once | INTRAMUSCULAR | Status: DC

## 2021-04-23 MED ORDER — LIDOCAINE HCL (PF) 2 % IJ SOLN
INTRAMUSCULAR | Status: AC
  Filled 2021-04-23: qty 10

## 2021-04-23 MED ORDER — MIDAZOLAM HCL 2 MG/2ML IJ SOLN
INTRAMUSCULAR | Status: AC
Start: 2021-04-23 — End: ?
  Filled 2021-04-23: qty 2

## 2021-04-23 MED ORDER — CARVEDILOL 6.25 MG PO TABS
6.2500 mg | ORAL_TABLET | Freq: Two times a day (BID) | ORAL | Status: DC
  Administered 2021-04-23 – 2021-04-26 (×6): 6.25 mg via ORAL
  Filled 2021-04-23 (×6): qty 1

## 2021-04-23 MED ORDER — TRAMADOL HCL 50 MG PO TABS
50.0000 mg | ORAL_TABLET | Freq: Four times a day (QID) | ORAL | Status: DC | PRN
  Administered 2021-04-24 – 2021-04-26 (×3): 50 mg via ORAL
  Filled 2021-04-23 (×3): qty 1

## 2021-04-23 SURGICAL SUPPLY — 98 items
ADH SKN CLS APL DERMABOND .7 (GAUZE/BANDAGES/DRESSINGS) ×1
BAG COUNTER SPONGE SURGICOUNT (BAG) ×3 IMPLANT
BAG SPNG CNTER NS LX DISP (BAG) ×1
BLADE EXTENDED COATED 6.5IN (ELECTRODE) IMPLANT
CANNULA REDUC XI 12-8 STAPL (CANNULA) ×2
CANNULA REDUCER 12-8 DVNC XI (CANNULA) IMPLANT
CELLS DAT CNTRL 66122 CELL SVR (MISCELLANEOUS) IMPLANT
COVER SURGICAL LIGHT HANDLE (MISCELLANEOUS) ×6 IMPLANT
COVER TIP SHEARS 8 DVNC (MISCELLANEOUS) ×2 IMPLANT
COVER TIP SHEARS 8MM DA VINCI (MISCELLANEOUS) ×2
DERMABOND ADVANCED (GAUZE/BANDAGES/DRESSINGS) ×1
DERMABOND ADVANCED .7 DNX12 (GAUZE/BANDAGES/DRESSINGS) IMPLANT
DRAIN CHANNEL 19F RND (DRAIN) IMPLANT
DRAPE ARM DVNC X/XI (DISPOSABLE) ×8 IMPLANT
DRAPE COLUMN DVNC XI (DISPOSABLE) ×2 IMPLANT
DRAPE DA VINCI XI ARM (DISPOSABLE) ×8
DRAPE DA VINCI XI COLUMN (DISPOSABLE) ×2
DRAPE SURG IRRIG POUCH 19X23 (DRAPES) ×3 IMPLANT
DRSG OPSITE POSTOP 4X10 (GAUZE/BANDAGES/DRESSINGS) IMPLANT
DRSG OPSITE POSTOP 4X6 (GAUZE/BANDAGES/DRESSINGS) ×1 IMPLANT
DRSG OPSITE POSTOP 4X8 (GAUZE/BANDAGES/DRESSINGS) IMPLANT
ELECT PENCIL ROCKER SW 15FT (MISCELLANEOUS) ×3 IMPLANT
ELECT REM PT RETURN 15FT ADLT (MISCELLANEOUS) ×3 IMPLANT
ENDOLOOP SUT PDS II  0 18 (SUTURE)
ENDOLOOP SUT PDS II 0 18 (SUTURE) IMPLANT
EVACUATOR SILICONE 100CC (DRAIN) IMPLANT
GLOVE SURG ENC MOIS LTX SZ6.5 (GLOVE) ×9 IMPLANT
GLOVE SURG UNDER LTX SZ6.5 (GLOVE) ×3 IMPLANT
GLOVE SURG UNDER POLY LF SZ7 (GLOVE) ×6 IMPLANT
GOWN STRL REUS W/TWL XL LVL3 (GOWN DISPOSABLE) ×9 IMPLANT
GRASPER SUT TROCAR 14GX15 (MISCELLANEOUS) IMPLANT
HOLDER FOLEY CATH W/STRAP (MISCELLANEOUS) ×3 IMPLANT
IRRIG SUCT STRYKERFLOW 2 WTIP (MISCELLANEOUS) ×2
IRRIGATION SUCT STRKRFLW 2 WTP (MISCELLANEOUS) ×2 IMPLANT
KIT PROCEDURE DA VINCI SI (MISCELLANEOUS) ×2
KIT PROCEDURE DVNC SI (MISCELLANEOUS) IMPLANT
KIT TURNOVER KIT A (KITS) IMPLANT
NDL INSUFFLATION 14GA 120MM (NEEDLE) ×2 IMPLANT
NEEDLE INSUFFLATION 14GA 120MM (NEEDLE) ×2 IMPLANT
PACK CARDIOVASCULAR III (CUSTOM PROCEDURE TRAY) ×3 IMPLANT
PACK COLON (CUSTOM PROCEDURE TRAY) ×3 IMPLANT
PAD POSITIONING PINK XL (MISCELLANEOUS) ×3 IMPLANT
RELOAD STAPLE 60 2.5 WHT DVNC (STAPLE) IMPLANT
RELOAD STAPLE 60 3.5 BLU DVNC (STAPLE) IMPLANT
RELOAD STAPLE 60 4.3 GRN DVNC (STAPLE) IMPLANT
RELOAD STAPLER 2.5X60 WHT DVNC (STAPLE) ×1 IMPLANT
RELOAD STAPLER 3.5X60 BLU DVNC (STAPLE) IMPLANT
RELOAD STAPLER 4.3X60 GRN DVNC (STAPLE) ×2 IMPLANT
RETRACTOR WND ALEXIS 18 MED (MISCELLANEOUS) IMPLANT
RTRCTR WOUND ALEXIS 18CM MED (MISCELLANEOUS)
SCISSORS LAP 5X35 DISP (ENDOMECHANICALS) IMPLANT
SEAL CANN UNIV 5-8 DVNC XI (MISCELLANEOUS) ×6 IMPLANT
SEAL XI 5MM-8MM UNIVERSAL (MISCELLANEOUS) ×10
SEALER VESSEL DA VINCI XI (MISCELLANEOUS) ×2
SEALER VESSEL EXT DVNC XI (MISCELLANEOUS) ×2 IMPLANT
SOLUTION ELECTROLUBE (MISCELLANEOUS) ×3 IMPLANT
SPIKE FLUID TRANSFER (MISCELLANEOUS) IMPLANT
STAPLER 60 DA VINCI SURE FORM (STAPLE) ×2
STAPLER 60 SUREFORM DVNC (STAPLE) IMPLANT
STAPLER CANNULA SEAL DVNC XI (STAPLE) IMPLANT
STAPLER CANNULA SEAL XI (STAPLE)
STAPLER ECHELON POWER CIR 29 (STAPLE) IMPLANT
STAPLER ECHELON POWER CIR 31 (STAPLE) IMPLANT
STAPLER RELOAD 2.5X60 WHITE (STAPLE) ×2
STAPLER RELOAD 2.5X60 WHT DVNC (STAPLE) ×1
STAPLER RELOAD 3.5X60 BLU DVNC (STAPLE)
STAPLER RELOAD 3.5X60 BLUE (STAPLE)
STAPLER RELOAD 4.3X60 GREEN (STAPLE) ×4
STAPLER RELOAD 4.3X60 GRN DVNC (STAPLE) ×2
STOPCOCK 4 WAY LG BORE MALE ST (IV SETS) ×6 IMPLANT
SUT ETHILON 2 0 PS N (SUTURE) IMPLANT
SUT NOVA NAB DX-16 0-1 5-0 T12 (SUTURE) ×6 IMPLANT
SUT PROLENE 2 0 KS (SUTURE) IMPLANT
SUT SILK 2 0 (SUTURE) ×2
SUT SILK 2 0 SH CR/8 (SUTURE) IMPLANT
SUT SILK 2-0 18XBRD TIE 12 (SUTURE) ×2 IMPLANT
SUT SILK 3 0 (SUTURE)
SUT SILK 3 0 SH CR/8 (SUTURE) ×3 IMPLANT
SUT SILK 3-0 18XBRD TIE 12 (SUTURE) IMPLANT
SUT V-LOC BARB 180 2/0GR6 GS22 (SUTURE) ×4
SUT VIC AB 2-0 SH 18 (SUTURE) IMPLANT
SUT VIC AB 2-0 SH 27 (SUTURE)
SUT VIC AB 2-0 SH 27X BRD (SUTURE) IMPLANT
SUT VIC AB 3-0 SH 18 (SUTURE) IMPLANT
SUT VIC AB 4-0 PS2 27 (SUTURE) ×6 IMPLANT
SUT VICRYL 0 UR6 27IN ABS (SUTURE) ×3 IMPLANT
SUTURE V-LC BRB 180 2/0GR6GS22 (SUTURE) IMPLANT
SYR 10ML ECCENTRIC (SYRINGE) ×3 IMPLANT
SYS LAPSCP GELPORT 120MM (MISCELLANEOUS)
SYS WOUND ALEXIS 18CM MED (MISCELLANEOUS)
SYSTEM LAPSCP GELPORT 120MM (MISCELLANEOUS) IMPLANT
SYSTEM WOUND ALEXIS 18CM MED (MISCELLANEOUS) IMPLANT
TOWEL OR 17X26 10 PK STRL BLUE (TOWEL DISPOSABLE) IMPLANT
TOWEL OR NON WOVEN STRL DISP B (DISPOSABLE) ×3 IMPLANT
TRAY FOLEY MTR SLVR 16FR STAT (SET/KITS/TRAYS/PACK) ×3 IMPLANT
TROCAR ADV FIXATION 5X100MM (TROCAR) ×3 IMPLANT
TUBING CONNECTING 10 (TUBING) ×6 IMPLANT
TUBING INSUFFLATION 10FT LAP (TUBING) ×3 IMPLANT

## 2021-04-23 NOTE — Op Note (Signed)
04/23/2021 ? ?10:30 AM ? ?PATIENT:  Fernando Dean  71 y.o. male ? ?Patient Care Team: ?Lindell Spar, MD as PCP - General (Internal Medicine) ?Satira Sark, MD as PCP - Cardiology (Cardiology) ? ?PRE-OPERATIVE DIAGNOSIS:  colon cancer ? ?POST-OPERATIVE DIAGNOSIS:  colon cancer ? ?PROCEDURE:   ?XI ROBOT ASSISTED RESECTION OF SPLENIC FLEXURE MASS ?INTRAOPERATIVE ASSESSMENT OF PERFUSION ? ?  Surgeon(s): ?Leighton Ruff, MD ? ?ASSISTANT: Dr Dema Severin  ? ?ANESTHESIA:   local and general ? ?EBL: 19ml  Total I/O ?In: 500 [I.V.:500] ?Out: 350 [Urine:300; Blood:50] ? ?Delay start of Pharmacological VTE agent (>24hrs) due to surgical blood loss or risk of bleeding:  no ? ?DRAINS: none  ? ?SPECIMEN:  Source of Specimen:  distal transverse, splenic flexure and proximal descending colon ? ?DISPOSITION OF SPECIMEN:  PATHOLOGY ? ?COUNTS:  YES ? ?PLAN OF CARE: Admit to inpatient  ? ?PATIENT DISPOSITION:  PACU - hemodynamically stable. ? ?INDICATION:   ? ?71 y.o. M with colon mass noted at 40 cm from that anal verge.  I recommended segmental resection: ? ?The anatomy & physiology of the digestive tract was discussed.  The pathophysiology was discussed.  Natural history risks without surgery was discussed.   I worked to give an overview of the disease and the frequent need to have multispecialty involvement.  I feel the risks of no intervention will lead to serious problems that outweigh the operative risks; therefore, I recommended a partial colectomy to remove the pathology.  Laparoscopic & open techniques were discussed.   ?Risks such as bleeding, infection, abscess, leak, reoperation, possible ostomy, hernia, heart attack, death, and other risks were discussed.  I noted a good likelihood this will help address the problem.   Goals of post-operative recovery were discussed as well.   ? ?The patient expressed understanding & wished to proceed with surgery. ? ?OR FINDINGS:  ? ?Patient had a proximal descending colon/splenic  flexure mass with tattoo distal ? ?No obvious metastatic disease on visceral parietal peritoneum or liver. ? ?DESCRIPTION:  ? ?Informed consent was confirmed.  The patient underwent general anaesthesia without difficulty.  The patient was positioned appropriately.  VTE prevention in place.  The patient's abdomen was clipped, prepped, & draped in a sterile fashion.  Surgical timeout confirmed our plan. ? ?The patient was positioned in reverse Trendelenburg.  Abdominal entry was gained using a Varies needle in the LUQ.  Entry was clean.  I induced carbon dioxide insufflation.  An 34mm robotic port was placed in the RUQ.  Camera inspection revealed no injury.  Extra ports were carefully placed under direct laparoscopic visualization.  I laparoscopically reflected the greater omentum and the upper abdomen the small bowel in the right abdomen. The patient was appropriately positioned and the robot was docked to the patient's left side.  Instruments were placed under direct visualization.  ? ?I began by identifying the tattooed area in the proximal descending colon.  The mass was just proximal to this at the exit of the splenic flexure.  I began with mobilization of the splenic flexure.  The omentum was taken down from the colon using the robotic vessel sealer.  The sigmoid colon was adherent high into the abdomen at the ligament of Treitz.  This was carefully taken down and repositioned in the lower abdomen.  I continued my dissection by entering into the lesser sac and dividing the splenocolic ligament.  I continued this to dissect the remaining splenic flexure attachments.  I continued down the  paracolic gutter to free the lateral border.  I then elevated the colon mass and identified the vascular pedicle.  I identified the IMV and transected this using the robotic vessel sealer.  I then divided the IMA as well.  The retroperitoneal plane was dissected bluntly.  The remaining retroperitoneal attachments were divided  using the vessel sealer.  Identified the left branch of the middle colic and continued my mesenteric dissection up to this level of the transverse colon.  Identified a perfusing vessel to the sigmoid colon and divided the remaining mesentery up to the descending sigmoid junction.  I then transected the descending colon and transverse colon using 60 mm green load robotic staplers.  This freed the entire specimen which was placed in the left upper quadrant.  The remaining transverse colon and sigmoid colon were positioned in a isoperistaltic fashion.  I confirmed good perfusion to both the transverse colon and the sigmoid colon using a firefly injection intravenously.  There was excellent perfusion of the transverse colon.  The sigmoid colon then perfused after that.  There was no signs of ischemia noted in the remaining colon.  I then made an enterotomy in the transverse colon and sigmoid colon.  A white load stapler was introduced into both enterotomy sites and an anastomosis was created.  The common enterotomy was then closed using 2, 2 0 V-Loc sutures in a running canal fashion.  The left upper quadrant was irrigated with normal saline.  The robot was then undocked. ? ?I then enlarged the suprapubic 12 mm port into a Pfannenstiel incision.  An [place] wound protector was placed.  The colon was removed.  The abdomen was inspected.  Hemostasis was good.  The Ubaldo Glassing was then removed and the peritoneum was closed using a running 0 Vicryl suture.  The fascia was closed using a running #1 Novafil sutures.  The subcutaneous tissue was reapproximated using a running 2-0 Vicryl suture and the skin was closed using a 4-0 Vicryl suture.  A sterile dressing was applied.  The remaining port sites were closed using interrupted 4-0 Vicryl sutures and Dermabond.  The patient was then awakened from anesthesia and sent to the postanesthesia care unit in stable condition.  All counts were correct per operating room staff. ? ? ?An MD  assistant was necessary for tissue manipulation, retraction and positioning due to the complexity of the case and hospital policies  ? ?Rosario Adie, MD ? ?Colorectal and General Surgery ?[facility]  ?

## 2021-04-23 NOTE — Transfer of Care (Signed)
Immediate Anesthesia Transfer of Care Note ? ?Patient: Fernando Dean ? ?Procedure(s) Performed: Procedure(s): ?XI ROBOT ASSISTED RESECTION OF SPLENIC FLEXURE (N/A) ? ?Patient Location: PACU ? ?Anesthesia Type:General ? ?Level of Consciousness:  sedated, patient cooperative and responds to stimulation ? ?Airway & Oxygen Therapy:Patient Spontanous Breathing and Patient connected to face mask oxgen ? ?Post-op Assessment:  Report given to PACU RN and Post -op Vital signs reviewed and stable ? ?Post vital signs:  Reviewed and stable ? ?Last Vitals:  ?Vitals:  ? 04/23/21 0546  ?BP: 130/72  ?Pulse: 60  ?Resp: 18  ?Temp: 36.8 ?C  ?SpO2: 100%  ? ? ?Complications: No apparent anesthesia complications ? ?

## 2021-04-23 NOTE — H&P (Signed)
?REFERRING PHYSICIAN:  [name], [place]* ?  ?PROVIDER:  Monico Blitz, MD ?  ?MRN: [id number] ?DOB: 03/16/1950 ?DATE OF ENCOUNTER: 03/22/2021 ?  ?Subjective  ?  ?Chief Complaint: Follow-up ?  ?  ?  ?History of Present Illness: ?Fernando Dean is a 71 y.o. male who is seen today as an office consultation at the request of Dr. [name] for evaluation of colon mass.  Patient recently underwent a endovascular stent placement for an abdominal aortic aneurysm.  He was noted to have a positive fecal occult blood test and was evaluated by Dr. Jenetta Downer.  Colonoscopy revealed a partially obstructing mass in the sigmoid colon approximately 40 cm from the anus.  He was unable to pass an ultraslim scope.  1 other large polyp was found in the sigmoid colon and was resected.  The mass was tattooed distally.  He is scheduled to undergo CT scans of the abdomen and pelvis later this week.  CEA level was noted to be elevated.  Patient has coronary artery disease status post pacemaker placement.  He is currently on a nicotine patch trying to quit smoking.  He has stage III kidney disease.  Overall he is very active and performs daily living activities without difficulty. ?  ?  ?  ?  ?Review of Systems: ?A complete review of systems was obtained from the patient.  I have reviewed this information and discussed as appropriate with the patient.  See HPI as well for other ROS.  ?  ?  ?Medical History: ?Past Medical History  ?    ?Past Medical History:  ?Diagnosis Date  ? Aneurysm (CMS-HCC)    ? Hypertension    ?  ?CAD ?CKD ?  ?There is no problem list on file for this patient. ?  ?  ?Past Surgical History  ?     ?Past Surgical History:  ?Procedure Laterality Date  ? stint      ? TRANSCATHETER INSERTION/REPLACEMENT LEADLESS PACEMAKER      ?  ?  ?  ?Allergies  ?    ?Allergies  ?Allergen Reactions  ? Ibuprofen Palpitations and Shortness Of Breath  ? Naproxen Shortness Of Breath  ? Nsaids (Non-Steroidal  Anti-Inflammatory Drug) Shortness Of Breath  ?  ?  ?  ?      ?Current Outpatient Medications on File Prior to Visit  ?Medication Sig Dispense Refill  ? aspirin 81 MG EC tablet Take by mouth      ? carvediloL (COREG) 6.25 MG tablet        ? multivitamin with minerals tablet Take by mouth      ? nicotine (NICODERM CQ) 21 mg/24 hr patch Place 1 patch onto the skin once daily      ?  ?No current facility-administered medications on file prior to visit.  ?  ?  ?Family History  ?     ?Family History  ?Problem Relation Age of Onset  ? Obesity Mother    ? High blood pressure (Hypertension) Mother    ? Colon cancer Mother    ? Stroke Father    ? High blood pressure (Hypertension) Father    ? Hyperlipidemia (Elevated cholesterol) Father    ? Coronary Artery Disease (Blocked arteries around heart) Father    ? High blood pressure (Hypertension) Sister    ? Obesity Sister    ?  ?  ?  ?Social History  ?  ?    ?Tobacco Use  ?Smoking Status Former  ? Types: Cigarettes  ?  Smokeless Tobacco Never  ?  ?  ?Social History  ?Social History  ?  ?     ?Socioeconomic History  ? Marital status: Single  ?Tobacco Use  ? Smoking status: Former  ?    Types: Cigarettes  ? Smokeless tobacco: Never  ?Substance and Sexual Activity  ? Alcohol use: Yes  ? Drug use: Yes  ?    Types: Marijuana  ?  ?  ?  ?Objective:  ?  ?Vitals:  ? 04/23/21 0546  ?BP: 130/72  ?Pulse: 60  ?Resp: 18  ?Temp: 98.2 ?F (36.8 ?C)  ?SpO2: 100%  ?  ?Exam ?Gen: NAD ?CV: RRR ?Lungs:CTA ?Abd: soft ?  ?  ?  ?Labs, Imaging and Diagnostic Testing: ?Baseline creatinine 1.7, hemoglobin 11, INR 1.0, CEA level 8.3 ?  ?Assessment and Plan:  ?Diagnoses and all orders for this visit: ?  ?Mass of colon ?  ?71 year old male with multiple medical problems who recently underwent endograft stent placement with vascular surgery.  The patient presents to the office with a obstructing colon mass seen on colonoscopy.  Biopsies do not show malignancy but CEA is elevated.  The area is tattooed distally.   He will have CT scans later this week.  I have recommended minimally invasive partial colectomy. ?We have discussed weaning his nicotine patch as much as possible to help with postoperative healing.  We have discussed the importance of not smoking.  We have discussed getting adequate nutrition.  We have discussed in detail's of surgery.  We will obtain cardiac clearance as well as discuss the surgery with cardiac electrophysiologist prior to procedure. ?The surgery and anatomy were described to the patient as well as the risks of surgery and the possible complications.  These include: Bleeding, deep abdominal infections and possible wound complications such as hernia and infection, damage to adjacent structures, leak of surgical connections, which can lead to other surgeries and possibly an ostomy, possible need for other procedures, such as abscess drains in radiology, possible prolonged hospital stay, possible diarrhea from removal of part of the colon, possible constipation from narcotics, possible bowel, bladder or sexual dysfunction if having rectal surgery, prolonged fatigue/weakness or appetite loss, possible early recurrence of of disease, possible complications of their medical problems such as heart disease or arrhythmias or lung problems, death (less than 1%). I believe the patient understands and wishes to proceed with the surgery. ?  ?No follow-ups on file. ?  ?  ?  ?

## 2021-04-23 NOTE — Anesthesia Postprocedure Evaluation (Signed)
Anesthesia Post Note ? ?Patient: Fernando Dean ? ?Procedure(s) Performed: XI ROBOT ASSISTED RESECTION OF SPLENIC FLEXURE ? ?  ? ?Patient location during evaluation: PACU ?Anesthesia Type: General ?Level of consciousness: awake and alert ?Pain management: pain level controlled ?Vital Signs Assessment: post-procedure vital signs reviewed and stable ?Respiratory status: spontaneous breathing, nonlabored ventilation, respiratory function stable and patient connected to nasal cannula oxygen ?Cardiovascular status: blood pressure returned to baseline and stable ?Postop Assessment: no apparent nausea or vomiting ?Anesthetic complications: no ? ? ?No notable events documented. ? ?Last Vitals:  ?Vitals:  ? 04/23/21 1526 04/23/21 1526  ?BP:  (!) 168/83  ?Pulse:  93  ?Resp:  14  ?Temp: 36.4 ?C   ?SpO2:  97%  ?  ?Last Pain:  ?Vitals:  ? 04/23/21 1526  ?TempSrc: Oral  ?PainSc:   ? ? ?  ?  ?  ?  ?  ?  ? ?[place] ? ? ? ? ?

## 2021-04-23 NOTE — Anesthesia Procedure Notes (Signed)
Procedure Name: Intubation ?Date/Time: 04/23/2021 7:53 AM ?Performed by: Lavina Hamman, CRNA ?Pre-anesthesia Checklist: Patient identified, Emergency Drugs available, Suction available, Patient being monitored and Timeout performed ?Patient Re-evaluated:Patient Re-evaluated prior to induction ?Oxygen Delivery Method: Circle system utilized ?Preoxygenation: Pre-oxygenation with 100% oxygen ?Induction Type: IV induction ?Ventilation: Mask ventilation without difficulty ?Laryngoscope Size: Mac and 4 ?Grade View: Grade I ?Tube type: Oral ?Tube size: 7.5 mm ?Number of attempts: 1 ?Airway Equipment and Method: Stylet ?Placement Confirmation: ETT inserted through vocal cords under direct vision, positive ETCO2, CO2 detector and breath sounds checked- equal and bilateral ?Secured at: 23 cm ?Tube secured with: Tape ?Dental Injury: Teeth and Oropharynx as per pre-operative assessment  ?Comments: ATOI, dentition very poor with multiple loose teeth.   ?All remain as in preop.   ? ? ? ? ?

## 2021-04-24 ENCOUNTER — Encounter (HOSPITAL_COMMUNITY): Payer: Self-pay | Admitting: General Surgery

## 2021-04-24 LAB — BASIC METABOLIC PANEL
Anion gap: 6 (ref 5–15)
BUN: 18 mg/dL (ref 8–23)
CO2: 21 mmol/L — ABNORMAL LOW (ref 22–32)
Calcium: 8.7 mg/dL — ABNORMAL LOW (ref 8.9–10.3)
Chloride: 103 mmol/L (ref 98–111)
Creatinine, Ser: 1.8 mg/dL — ABNORMAL HIGH (ref 0.61–1.24)
GFR, Estimated: 40 mL/min — ABNORMAL LOW (ref >60)
Glucose, Bld: 132 mg/dL — ABNORMAL HIGH (ref 70–99)
Potassium: 4.4 mmol/L (ref 3.5–5.1)
Sodium: 130 mmol/L — ABNORMAL LOW (ref 135–145)

## 2021-04-24 LAB — CBC
HCT: 30.8 % — ABNORMAL LOW (ref 39.0–52.0)
Hemoglobin: 9.6 g/dL — ABNORMAL LOW (ref 13.0–17.0)
MCH: 26.7 pg (ref 26.0–34.0)
MCHC: 31.2 g/dL (ref 30.0–36.0)
MCV: 85.6 fL (ref 80.0–100.0)
Platelets: 190 10*3/uL (ref 150–400)
RBC: 3.6 MIL/uL — ABNORMAL LOW (ref 4.22–5.81)
RDW: 17.6 % — ABNORMAL HIGH (ref 11.5–15.5)
WBC: 20 10*3/uL — ABNORMAL HIGH (ref 4.0–10.5)
nRBC: 0 % (ref 0.0–0.2)

## 2021-04-24 MED ORDER — ASPIRIN EC 81 MG PO TBEC
81.0000 mg | DELAYED_RELEASE_TABLET | Freq: Every day | ORAL | Status: DC
  Administered 2021-04-24 – 2021-04-26 (×3): 81 mg via ORAL
  Filled 2021-04-24 (×3): qty 1

## 2021-04-24 MED ORDER — ATORVASTATIN CALCIUM 40 MG PO TABS
40.0000 mg | ORAL_TABLET | Freq: Every day | ORAL | Status: DC
  Administered 2021-04-24 – 2021-04-26 (×3): 40 mg via ORAL
  Filled 2021-04-24 (×3): qty 1

## 2021-04-24 MED ORDER — ADULT MULTIVITAMIN W/MINERALS CH
1.0000 | ORAL_TABLET | Freq: Every day | ORAL | Status: DC
  Administered 2021-04-24 – 2021-04-26 (×3): 1 via ORAL
  Filled 2021-04-24 (×3): qty 1

## 2021-04-24 NOTE — Progress Notes (Signed)
Patient ambulated in the hallway, tolerated well. Patient was educated and encouraged for the use of Incentive Spirometer, deep breathing and coughing techniques.  ?

## 2021-04-24 NOTE — Progress Notes (Signed)
Fernando Dean [id number] Oct 03, 1950  CARE TEAM:  PCP: Lindell Spar, MD  Outpatient Care Team: Patient Care Team: Lindell Spar, MD as PCP - General (Internal Medicine) Satira Sark, MD as PCP - Cardiology (Cardiology)  Inpatient Treatment Team: Treatment Team: Attending Provider: Leighton Ruff, MD; Registered Nurse: Alva Garnet, RN; Utilization Review: Conception Oms, RN; Student Nurse: Emeterio Reeve, Student-RN   Problem List:   Principal Problem:   Colonic mass s/p robotic splenic flexure resection 04/23/2021 Active Problems:   Essential hypertension   Heart block   CKD (chronic kidney disease), stage IIIB ([place])   S/P AAA repair   1 Day Post-Op  04/23/2021  POST-OPERATIVE DIAGNOSIS:  colon cancer   PROCEDURE:   XI ROBOT ASSISTED RESECTION OF SPLENIC FLEXURE MASS INTRAOPERATIVE ASSESSMENT OF PERFUSION   Surgeon(s): Leighton Ruff, MD  OR FINDINGS:    Patient had a proximal descending colon/splenic flexure mass with tattoo distal   No obvious metastatic disease on visceral parietal peritoneum or liver.     Assessment  Recovering relatively well  [facility] Stay = 1 days)  Plan:  Follow-up on pathology. ERAS enhance recovery protocol Full liquid diet.  Perhaps advance to soft if continues to rapidly improve Off IV fluids. History of chronic kidney disease.  Creatinine stably elevated.  Decent urine output.  Follow Hypertension-Coreg and Norvasc.  As needed backup Hypercholesterolemia.  Lipitor. -VTE prophylaxis- SCDs, etc -mobilize as tolerated to help recovery  Disposition:  Disposition:  The patient is from: Home  Anticipate discharge to:  Home  Anticipated Date of Discharge is:  March 6,2023    Barriers to discharge:  Pending Clinical improvement (more likely than not)  Patient currently is NOT MEDICALLY STABLE for discharge from the hospital from a surgery standpoint.      I reviewed last 24 h vitals and pain scores,  last 48 h intake and output, last 24 h labs and trends, and last 24 h imaging results. I have reviewed this patient's available data, including medical history, events of note, test results, etc as part of my evaluation.  A significant portion of that time was spent in counseling.  Care during the described time interval was provided by me.  This care required moderate level of medical decision making.  04/24/2021    Subjective: (Chief complaint)  Patient denies much pain.  Tolerated clear liquids.  Trying for liquids this morning.  Walking.  Objective:  Vital signs:  Vitals:   04/24/21 0132 04/24/21 0500 04/24/21 0514 04/24/21 0700  BP: 138/69  139/84 (!) 142/81  Pulse: 70  67 62  Resp: '16  16 18  '$ Temp: 97.9 F (36.6 C)  97.8 F (36.6 C) 97.6 F (36.4 C)  TempSrc: Oral  Oral Oral  SpO2: 100%  98% 96%  Weight:  78.8 kg    Height:        Last BM Date : 04/22/21  Intake/Output   Yesterday:  03/03 0701 - 03/04 0700 In: 2009.2 [P.O.:720; I.V.:1189.2; IV Piggyback:100] Out: 2130 [Urine:3000; Blood:50] This shift:  No intake/output data recorded.  Bowel function:  Flatus: YES  BM:  No  Drain: (No drain)   Physical Exam:  General: Pt awake/alert in no acute distress Eyes: PERRL, normal EOM.  Sclera clear.  No icterus Neuro: CN II-XII intact w/o focal sensory/motor deficits. Lymph: No head/neck/groin lymphadenopathy Psych:  No delerium/psychosis/paranoia.  Oriented x 4 HENT: Normocephalic, Mucus membranes moist.  No thrush Neck: Supple, No tracheal deviation.  No obvious thyromegaly Chest: No pain to chest wall compression.  Good respiratory excursion.  No audible wheezing CV:  Pulses intact.  Regular rhythm.  No major extremity edema MS: Normal AROM mjr joints.  No obvious deformity  Abdomen: Soft.  Mildy distended.  Nontender.  No evidence of peritonitis.  No incarcerated hernias.  Ext:   No deformity.  No mjr edema.  No cyanosis Skin: No petechiae /  purpurea.  No major sores.  Warm and dry    Results:   Cultures: Recent Results (from the past 720 hour(s))  SARS CORONAVIRUS 2 (TAT 6-24 HRS) Nasopharyngeal Nasopharyngeal Swab     Status: None   Collection Time: 04/21/21 12:33 PM   Specimen: Nasopharyngeal Swab  Result Value Ref Range Status   SARS Coronavirus 2 NEGATIVE NEGATIVE Final    Comment: (NOTE) SARS-CoV-2 target nucleic acids are NOT DETECTED.  The SARS-CoV-2 RNA is generally detectable in upper and lower respiratory specimens during the acute phase of infection. Negative results do not preclude SARS-CoV-2 infection, do not rule out co-infections with other pathogens, and should not be used as the sole basis for treatment or other patient management decisions. Negative results must be combined with clinical observations, patient history, and epidemiological information. The expected result is Negative.  Fact Sheet for Patients: SugarRoll.be  Fact Sheet for Healthcare Providers: https://www.woods-mathews.com/  This test is not yet approved or cleared by the [place] FDA and  has been authorized for detection and/or diagnosis of SARS-CoV-2 by FDA under an Emergency Use Authorization (EUA). This EUA will remain  in effect (meaning this test can be used) for the duration of the COVID-19 declaration under Se ction 564(b)(1) of the Act, 21 U.S.C. section 360bbb-3(b)(1), unless the authorization is terminated or revoked sooner.  Performed at [facility], [place] [address] [place], [place] [address]     Labs: Results for orders placed or performed during the hospital encounter of 04/23/21 (from the past 48 hour(s))  CBC     Status: Abnormal   Collection Time: 04/24/21  4:23 AM  Result Value Ref Range   WBC 20.0 (H) 4.0 - 10.5 K/uL   RBC 3.60 (L) 4.22 - 5.81 MIL/uL   Hemoglobin 9.6 (L) 13.0 - 17.0 g/dL   HCT 30.8 (L) 39.0 - 52.0 %   MCV 85.6 80.0 - 100.0 fL    MCH 26.7 26.0 - 34.0 pg   MCHC 31.2 30.0 - 36.0 g/dL   RDW 17.6 (H) 11.5 - 15.5 %   Platelets 190 150 - 400 K/uL   nRBC 0.0 0.0 - 0.2 %    Comment: Performed at [facility], [place] [address], [place], [place] [address]  Basic metabolic panel     Status: Abnormal   Collection Time: 04/24/21  4:23 AM  Result Value Ref Range   Sodium 130 (L) 135 - 145 mmol/L   Potassium 4.4 3.5 - 5.1 mmol/L   Chloride 103 98 - 111 mmol/L   CO2 21 (L) 22 - 32 mmol/L   Glucose, Bld 132 (H) 70 - 99 mg/dL    Comment: Glucose reference range applies only to samples taken after fasting for at least 8 hours.   BUN 18 8 - 23 mg/dL   Creatinine, Ser 1.80 (H) 0.61 - 1.24 mg/dL   Calcium 8.7 (L) 8.9 - 10.3 mg/dL   GFR, Estimated 40 (L) >60 mL/min    Comment: (NOTE) Calculated using the CKD-EPI Creatinine Equation (2021)  Anion gap 6 5 - 15    Comment: Performed at [facility], [place] [address] [place], [place] [address]    Imaging / Studies: No results found.  Medications / Allergies: per chart  Antibiotics: Anti-infectives (From admission, onward)    Start     Dose/Rate Route Frequency Ordered Stop   04/23/21 2000  cefoTEtan (CEFOTAN) 2 g in sodium chloride 0.9 % 100 mL IVPB        2 g 200 mL/hr over 30 Minutes Intravenous Every 12 hours 04/23/21 1242 04/23/21 2139   04/23/21 0600  cefoTEtan (CEFOTAN) 2 g in sodium chloride 0.9 % 100 mL IVPB        2 g 200 mL/hr over 30 Minutes Intravenous On call to O.R. 04/23/21 0534 04/23/21 5364         Note: Portions of this report may have been transcribed using voice recognition software. Every effort was made to ensure accuracy; however, inadvertent computerized transcription errors may be present.   Any transcriptional errors that result from this process are unintentional.    Adin Hector, MD, FACS, MASCRS Esophageal, Gastrointestinal & Colorectal Surgery Robotic and Minimally Invasive Surgery  [facility], [place]. [address], [place], [place] [address] [phone] Fax [phone] Main  CONTACT INFORMATION:  Weekday (9AM-5PM): Call [facility] main office at [phone]  Weeknight (5PM-9AM) or Weekend/Holiday: Check [email or web address] (password " [email or web address]") for General Surgery [facility] coverage  (Please, do not use SecureChat as it is not reliable communication to operating surgeons for immediate patient care)      04/24/2021  8:09 AM

## 2021-04-25 LAB — CBC
HCT: 30.4 % — ABNORMAL LOW (ref 39.0–52.0)
Hemoglobin: 9.4 g/dL — ABNORMAL LOW (ref 13.0–17.0)
MCH: 26.5 pg (ref 26.0–34.0)
MCHC: 30.9 g/dL (ref 30.0–36.0)
MCV: 85.6 fL (ref 80.0–100.0)
Platelets: 209 10*3/uL (ref 150–400)
RBC: 3.55 MIL/uL — ABNORMAL LOW (ref 4.22–5.81)
RDW: 18 % — ABNORMAL HIGH (ref 11.5–15.5)
WBC: 13.4 10*3/uL — ABNORMAL HIGH (ref 4.0–10.5)
nRBC: 0 % (ref 0.0–0.2)

## 2021-04-25 LAB — BASIC METABOLIC PANEL
Anion gap: 5 (ref 5–15)
BUN: 20 mg/dL (ref 8–23)
CO2: 24 mmol/L (ref 22–32)
Calcium: 8.8 mg/dL — ABNORMAL LOW (ref 8.9–10.3)
Chloride: 105 mmol/L (ref 98–111)
Creatinine, Ser: 1.73 mg/dL — ABNORMAL HIGH (ref 0.61–1.24)
GFR, Estimated: 42 mL/min — ABNORMAL LOW (ref >60)
Glucose, Bld: 87 mg/dL (ref 70–99)
Potassium: 4.1 mmol/L (ref 3.5–5.1)
Sodium: 134 mmol/L — ABNORMAL LOW (ref 135–145)

## 2021-04-25 MED ORDER — FERROUS SULFATE 325 (65 FE) MG PO TABS
325.0000 mg | ORAL_TABLET | Freq: Two times a day (BID) | ORAL | Status: DC
  Administered 2021-04-25 – 2021-04-26 (×3): 325 mg via ORAL
  Filled 2021-04-25 (×3): qty 1

## 2021-04-25 MED ORDER — CALCIUM POLYCARBOPHIL 625 MG PO TABS
625.0000 mg | ORAL_TABLET | Freq: Two times a day (BID) | ORAL | Status: DC
  Administered 2021-04-25 – 2021-04-26 (×3): 625 mg via ORAL
  Filled 2021-04-25 (×3): qty 1

## 2021-04-25 MED ORDER — BISMUTH SUBSALICYLATE 262 MG/15ML PO SUSP
30.0000 mL | Freq: Once | ORAL | Status: AC
  Administered 2021-04-25: 30 mL via ORAL
  Filled 2021-04-25: qty 236

## 2021-04-25 NOTE — Progress Notes (Signed)
Fernando Dean [id number] 10/16/1950  CARE TEAM:  PCP: Lindell Spar, MD  Outpatient Care Team: Patient Care Team: Lindell Spar, MD as PCP - General (Internal Medicine) Satira Sark, MD as PCP - Cardiology (Cardiology)  Inpatient Treatment Team: Treatment Team: Attending Provider: Leighton Ruff, MD; Registered Nurse: Chucky May, RN; Registered Nurse: Alva Garnet, RN   Problem List:   Principal Problem:   Colonic mass s/p robotic splenic flexure resection 04/23/2021 Active Problems:   Essential hypertension   Heart block   CKD (chronic kidney disease), stage IIIB ([place])   S/P AAA repair   2 Days Post-Op  04/23/2021  POST-OPERATIVE DIAGNOSIS:  colon cancer   PROCEDURE:   XI ROBOT ASSISTED RESECTION OF SPLENIC FLEXURE MASS INTRAOPERATIVE ASSESSMENT OF PERFUSION   Surgeon(s): Leighton Ruff, MD  OR FINDINGS:    Patient had a proximal descending colon/splenic flexure mass with tattoo distal   No obvious metastatic disease on visceral parietal peritoneum or liver.     Assessment  Recovering relatively well  [facility] Stay = 2 days)  Plan:  -Follow-up on pathology. -ERAS enhance recovery protocol  -Some loose bowel movements is not surprising initially.  We will add fiber to thicken up and iron to constipate.  Pepto-Bismol x1 since he notes that sometimes help and tolerates that despite his questionable issue with NSAIDs.  Solid diet. Off IV fluids. History of chronic kidney disease.  Creatinine stably elevated.  Decent urine output.  Follow Hypertension-Coreg and Norvasc.  As needed backup Hypercholesterolemia.  Lipitor. -VTE prophylaxis- SCDs, etc -mobilize as tolerated to help recovery  Disposition:  Disposition:  The patient is from: Home  Anticipate discharge to:  Home  Anticipated Date of Discharge is:  March 6,2023    Barriers to discharge:  Pending Clinical improvement (more likely than not)  Patient currently is NOT  MEDICALLY STABLE for discharge from the hospital from a surgery standpoint.      I reviewed last 24 h vitals and pain scores, last 48 h intake and output, last 24 h labs and trends, and last 24 h imaging results. I have reviewed this patient's available data, including medical history, events of note, test results, etc as part of my evaluation.  A significant portion of that time was spent in counseling.  Care during the described time interval was provided by me.  This care required moderate level of medical decision making.  04/25/2021    Subjective: (Chief complaint)  Patient denies much pain.  Tolerated thicker liquids and trying soft diet.  A lot of loose bowel movements yesterday which concerned him.  Walking.  Objective:  Vital signs:  Vitals:   04/24/21 0909 04/24/21 1208 04/24/21 2057 04/25/21 0610  BP: (!) 143/79 139/71 (!) 146/86 140/80  Pulse: 72 65 73 68  Resp: '18 18 16 18  '$ Temp: 97.6 F (36.4 C) 97.9 F (36.6 C) 97.8 F (36.6 C) 97.8 F (36.6 C)  TempSrc: Oral Oral Oral Oral  SpO2: 98% 96% 96% 95%  Weight:    80.4 kg  Height:        Last BM Date : 04/24/21  Intake/Output   Yesterday:  03/04 0701 - 03/05 0700 In: 360 [P.O.:360] Out: 828 [Urine:828] This shift:  No intake/output data recorded.  Bowel function:  Flatus: YES  BM:  YES  Drain: (No drain)   Physical Exam:  General: Pt awakens alert in no acute distress.  Resting comfortably.  Awakens with little difficulty and appears calm. Eyes:  PERRL, normal EOM.  Sclera clear.  No icterus Neuro: CN II-XII intact w/o focal sensory/motor deficits. Lymph: No head/neck/groin lymphadenopathy Psych:  No delerium/psychosis/paranoia.  Oriented x 4 HENT: Normocephalic, Mucus membranes moist.  No thrush Neck: Supple, No tracheal deviation.  No obvious thyromegaly Chest: No pain to chest wall compression.  Good respiratory excursion.  No audible wheezing CV:  Pulses intact.  Regular rhythm.  No  major extremity edema MS: Normal AROM mjr joints.  No obvious deformity  Abdomen: Soft.  Nondistended.  Nontender.  No evidence of peritonitis.  No incarcerated hernias.  Ext:   No deformity.  No mjr edema.  No cyanosis Skin: No petechiae / purpurea.  No major sores.  Warm and dry    Results:   Cultures: Recent Results (from the past 720 hour(s))  SARS CORONAVIRUS 2 (TAT 6-24 HRS) Nasopharyngeal Nasopharyngeal Swab     Status: None   Collection Time: 04/21/21 12:33 PM   Specimen: Nasopharyngeal Swab  Result Value Ref Range Status   SARS Coronavirus 2 NEGATIVE NEGATIVE Final    Comment: (NOTE) SARS-CoV-2 target nucleic acids are NOT DETECTED.  The SARS-CoV-2 RNA is generally detectable in upper and lower respiratory specimens during the acute phase of infection. Negative results do not preclude SARS-CoV-2 infection, do not rule out co-infections with other pathogens, and should not be used as the sole basis for treatment or other patient management decisions. Negative results must be combined with clinical observations, patient history, and epidemiological information. The expected result is Negative.  Fact Sheet for Patients: SugarRoll.be  Fact Sheet for Healthcare Providers: https://www.woods-mathews.com/  This test is not yet approved or cleared by the [place] FDA and  has been authorized for detection and/or diagnosis of SARS-CoV-2 by FDA under an Emergency Use Authorization (EUA). This EUA will remain  in effect (meaning this test can be used) for the duration of the COVID-19 declaration under Se ction 564(b)(1) of the Act, 21 U.S.C. section 360bbb-3(b)(1), unless the authorization is terminated or revoked sooner.  Performed at [facility], [place] [address], [place], [place] [address]     Labs: Results for orders placed or performed during the hospital encounter of 04/23/21 (from the past 48 hour(s))  CBC      Status: Abnormal   Collection Time: 04/24/21  4:23 AM  Result Value Ref Range   WBC 20.0 (H) 4.0 - 10.5 K/uL   RBC 3.60 (L) 4.22 - 5.81 MIL/uL   Hemoglobin 9.6 (L) 13.0 - 17.0 g/dL   HCT 30.8 (L) 39.0 - 52.0 %   MCV 85.6 80.0 - 100.0 fL   MCH 26.7 26.0 - 34.0 pg   MCHC 31.2 30.0 - 36.0 g/dL   RDW 17.6 (H) 11.5 - 15.5 %   Platelets 190 150 - 400 K/uL   nRBC 0.0 0.0 - 0.2 %    Comment: Performed at [facility], [place] [address] [place], [place] [address]  Basic metabolic panel     Status: Abnormal   Collection Time: 04/24/21  4:23 AM  Result Value Ref Range   Sodium 130 (L) 135 - 145 mmol/L   Potassium 4.4 3.5 - 5.1 mmol/L   Chloride 103 98 - 111 mmol/L   CO2 21 (L) 22 - 32 mmol/L   Glucose, Bld 132 (H) 70 - 99 mg/dL    Comment: Glucose reference range applies only to samples taken after fasting for at least 8 hours.   BUN 18 8 - 23 mg/dL  Creatinine, Ser 1.80 (H) 0.61 - 1.24 mg/dL   Calcium 8.7 (L) 8.9 - 10.3 mg/dL   GFR, Estimated 40 (L) >60 mL/min    Comment: (NOTE) Calculated using the CKD-EPI Creatinine Equation (2021)    Anion gap 6 5 - 15    Comment: Performed at [facility], [place] [address], [place], [place] [address]  CBC     Status: Abnormal   Collection Time: 04/25/21  4:21 AM  Result Value Ref Range   WBC 13.4 (H) 4.0 - 10.5 K/uL   RBC 3.55 (L) 4.22 - 5.81 MIL/uL   Hemoglobin 9.4 (L) 13.0 - 17.0 g/dL   HCT 30.4 (L) 39.0 - 52.0 %   MCV 85.6 80.0 - 100.0 fL   MCH 26.5 26.0 - 34.0 pg   MCHC 30.9 30.0 - 36.0 g/dL   RDW 18.0 (H) 11.5 - 15.5 %   Platelets 209 150 - 400 K/uL   nRBC 0.0 0.0 - 0.2 %    Comment: Performed at [facility], [place] [address] [place], [place] [address]  Basic metabolic panel     Status: Abnormal   Collection Time: 04/25/21  4:21 AM  Result Value Ref Range   Sodium 134 (L) 135 - 145 mmol/L   Potassium 4.1 3.5 - 5.1 mmol/L   Chloride 105 98 - 111 mmol/L   CO2 24 22 - 32 mmol/L    Glucose, Bld 87 70 - 99 mg/dL    Comment: Glucose reference range applies only to samples taken after fasting for at least 8 hours.   BUN 20 8 - 23 mg/dL   Creatinine, Ser 1.73 (H) 0.61 - 1.24 mg/dL   Calcium 8.8 (L) 8.9 - 10.3 mg/dL   GFR, Estimated 42 (L) >60 mL/min    Comment: (NOTE) Calculated using the CKD-EPI Creatinine Equation (2021)    Anion gap 5 5 - 15    Comment: Performed at [facility], [place] [address], [place], [place] [address]    Imaging / Studies: No results found.  Medications / Allergies: per chart  Antibiotics: Anti-infectives (From admission, onward)    Start     Dose/Rate Route Frequency Ordered Stop   04/23/21 2000  cefoTEtan (CEFOTAN) 2 g in sodium chloride 0.9 % 100 mL IVPB        2 g 200 mL/hr over 30 Minutes Intravenous Every 12 hours 04/23/21 1242 04/23/21 2139   04/23/21 0600  cefoTEtan (CEFOTAN) 2 g in sodium chloride 0.9 % 100 mL IVPB        2 g 200 mL/hr over 30 Minutes Intravenous On call to O.R. 04/23/21 0534 04/23/21 8250         Note: Portions of this report may have been transcribed using voice recognition software. Every effort was made to ensure accuracy; however, inadvertent computerized transcription errors may be present.   Any transcriptional errors that result from this process are unintentional.    Adin Hector, MD, FACS, MASCRS Esophageal, Gastrointestinal & Colorectal Surgery Robotic and Minimally Invasive Surgery  [facility], [place]  [place] [place], [place] [address] [phone] Fax [phone] Main  CONTACT INFORMATION:  Weekday (9AM-5PM): Call [facility] main office at [phone]  Weeknight (5PM-9AM) or Weekend/Holiday: Check [email or web address] (password " [email or web address]") for General Surgery [facility] coverage  (Please, do not use SecureChat as it is not reliable communication to operating surgeons for immediate patient care)  04/25/2021  7:23 AM

## 2021-04-26 LAB — BASIC METABOLIC PANEL
Anion gap: 9 (ref 5–15)
BUN: 17 mg/dL (ref 8–23)
CO2: 22 mmol/L (ref 22–32)
Calcium: 9.1 mg/dL (ref 8.9–10.3)
Chloride: 105 mmol/L (ref 98–111)
Creatinine, Ser: 1.59 mg/dL — ABNORMAL HIGH (ref 0.61–1.24)
GFR, Estimated: 46 mL/min — ABNORMAL LOW (ref >60)
Glucose, Bld: 88 mg/dL (ref 70–99)
Potassium: 4.1 mmol/L (ref 3.5–5.1)
Sodium: 136 mmol/L (ref 135–145)

## 2021-04-26 LAB — CBC
HCT: 32.4 % — ABNORMAL LOW (ref 39.0–52.0)
Hemoglobin: 10 g/dL — ABNORMAL LOW (ref 13.0–17.0)
MCH: 26.6 pg (ref 26.0–34.0)
MCHC: 30.9 g/dL (ref 30.0–36.0)
MCV: 86.2 fL (ref 80.0–100.0)
Platelets: 232 10*3/uL (ref 150–400)
RBC: 3.76 MIL/uL — ABNORMAL LOW (ref 4.22–5.81)
RDW: 18.2 % — ABNORMAL HIGH (ref 11.5–15.5)
WBC: 9.7 10*3/uL (ref 4.0–10.5)
nRBC: 0 % (ref 0.0–0.2)

## 2021-04-26 MED ORDER — TRAMADOL HCL 50 MG PO TABS: 50.0000 mg | ORAL_TABLET | Freq: Four times a day (QID) | ORAL | 0 refills | Status: DC | PRN

## 2021-04-26 NOTE — Progress Notes (Signed)
Discharge instructions given to patient and all questions were answered.  

## 2021-04-26 NOTE — Discharge Summary (Signed)
Physician Discharge Summary  ?Patient ID: ?Fernando Dean ?MRN: [id number] ?DOB/AGE: Dec 22, 1950 71 y.o. ? ?Admit date: 04/23/2021 ?Discharge date: 04/26/2021 ? ?Admission Diagnoses: ?Colon mass ?Discharge Diagnoses:  ?Principal Problem: ?  Colonic mass s/p robotic splenic flexure resection 04/23/2021 ?Active Problems: ?  Essential hypertension ?  Heart block ?  CKD (chronic kidney disease), stage IIIB ([place]) ?  S/P AAA repair ? ? ?Discharged Condition: good ? ?Hospital Course: Patient was admitted to the med surg floor after surgery.  Diet was advanced as tolerated.  Patient began to have bowel function on postop day 2.  By postop day 3, he was tolerating a solid diet and pain was controlled with oral medications.  He was urinating without difficulty and ambulating without assistance.  Patient was felt to be in stable condition for discharge to home. ? ? ?Consults: None ? ?Significant Diagnostic Studies: labs: cbc, bmet ? ?Treatments: IV hydration, analgesia: Tramadol, and surgery: robotic splenic flexure resection ? ?Discharge Exam: ?Blood pressure (!) 149/86, pulse 81, temperature 97.9 ?F (36.6 ?C), temperature source Oral, resp. rate 18, height 6' (1.829 m), weight 80.4 kg, SpO2 99 %. ?General appearance: alert and cooperative ?GI: normal findings: soft, non-tender ?Skin: Skin color, texture, turgor normal. No rashes or lesions or Incision: C/D/I ? ?Disposition: Discharge disposition: 01-Home or Self Care ? ? ? ? ? ? ? ?Allergies as of 04/26/2021   ? ?   Reactions  ? Advil [ibuprofen] Shortness Of Breath, Palpitations  ? Aleve [naproxen] Shortness Of Breath  ? Nsaids Shortness Of Breath  ? ?  ? ?  ?Medication List  ?  ? ?TAKE these medications   ? ?amLODipine 10 MG tablet ?Commonly known as: NORVASC ?Take 1 tablet (10 mg total) by mouth daily. ?  ?aspirin 81 MG EC tablet ?Take 1 tablet (81 mg total) by mouth daily at 6 (six) AM. Swallow whole. ?  ?atorvastatin 40 MG tablet ?Commonly known as: LIPITOR ?Take 1 tablet (40 mg  total) by mouth daily. ?  ?carvedilol 6.25 MG tablet ?Commonly known as: COREG ?Take 1 tablet (6.25 mg total) by mouth 2 (two) times daily with a meal. ?  ?multivitamin with minerals tablet ?Take 1 tablet by mouth daily. Centrum Silver for Men 50+ ?  ?nicotine 21 mg/24hr patch ?Commonly known as: NICODERM CQ - dosed in mg/24 hours ?Place 21 mg onto the skin daily. ?  ?traMADol 50 MG tablet ?Commonly known as: ULTRAM ?Take 1 tablet (50 mg total) by mouth every 6 (six) hours as needed for moderate pain. ?  ? ?  ? ? Follow-up Information   ? ? Leighton Ruff, MD. Schedule an appointment as soon as possible for a visit in 2 week(s).   ?Specialties: General Surgery, Colon and Rectal Surgery ?Contact information: ?[address] ?[place] [address] ?[phone] ? ? ?  ?  ? ?  ?  ? ?  ? ? ?Signed: ?Rosario Adie ?04/26/2021, 9:56 AM ? ? ?

## 2021-04-26 NOTE — Discharge Instructions (Signed)
SURGERY: POST OP INSTRUCTIONS (Surgery for small bowel obstruction, colon resection, etc)   ######################################################################  EAT Gradually transition to a high fiber diet with a fiber supplement over the next few days after discharge  WALK Walk an hour a day.  Control your pain to do that.    CONTROL PAIN Control pain so that you can walk, sleep, tolerate sneezing/coughing, go up/down stairs.  HAVE A BOWEL MOVEMENT DAILY Keep your bowels regular to avoid problems.  OK to try a laxative to override constipation.  OK to use an antidairrheal to slow down diarrhea.  Call if not better after 2 tries  CALL IF YOU HAVE PROBLEMS/CONCERNS Call if you are still struggling despite following these instructions. Call if you have concerns not answered by these instructions  ######################################################################   DIET Follow a light diet the first few days at home.  Start with a bland diet such as soups, liquids, starchy foods, low fat foods, etc.  If you feel full, bloated, or constipated, stay on a ful liquid or pureed/blenderized diet for a few days until you feel better and no longer constipated. Be sure to drink plenty of fluids every day to avoid getting dehydrated (feeling dizzy, not urinating, etc.). Gradually add a fiber supplement to your diet over the next week.  Gradually get back to a regular solid diet.  Avoid fast food or heavy meals the first week as you are more likely to get nauseated. It is expected for your digestive tract to need a few months to get back to normal.  It is common for your bowel movements and stools to be irregular.  You will have occasional bloating and cramping that should eventually fade away.  Until you are eating solid food normally, off all pain medications, and back to regular activities; your bowels will not be normal. Focus on eating a low-fat, high fiber diet the rest of your life  (See Getting to Good Bowel Health, below).  CARE of your INCISION or WOUND  It is good for closed incisions and even open wounds to be washed every day.  Shower every day.  Short baths are fine.  Wash the incisions and wounds clean with soap & water.    You may leave closed incisions open to air if it is dry.   You may cover the incision with clean gauze & replace it after your daily shower for comfort.  STAPLES: You have skin staples.  Leave them in place & set up an appointment for them to be removed by a surgery office nurse ~10 days after surgery. = 1st week of January 2024    ACTIVITIES as tolerated Start light daily activities --- self-care, walking, climbing stairs-- beginning the day after surgery.  Gradually increase activities as tolerated.  Control your pain to be active.  Stop when you are tired.  Ideally, walk several times a day, eventually an hour a day.   Most people are back to most day-to-day activities in a few weeks.  It takes 4-8 weeks to get back to unrestricted, intense activity. If you can walk 30 minutes without difficulty, it is safe to try more intense activity such as jogging, treadmill, bicycling, low-impact aerobics, swimming, etc. Save the most intensive and strenuous activity for last (Usually 4-8 weeks after surgery) such as sit-ups, heavy lifting, contact sports, etc.  Refrain from any intense heavy lifting or straining until you are off narcotics for pain control.  You will have off days, but things should improve   week-by-week. DO NOT PUSH THROUGH PAIN.  Let pain be your guide: If it hurts to do something, don't do it.  Pain is your body warning you to avoid that activity for another week until the pain goes down. You may drive when you are no longer taking narcotic prescription pain medication, you can comfortably wear a seatbelt, and you can safely make sudden turns/stops to protect yourself without hesitating due to pain. You may have sexual intercourse when it  is comfortable. If it hurts to do something, stop.  MEDICATIONS Take your usually prescribed home medications unless otherwise directed.   Blood thinners:  Usually you can restart any strong blood thinners after the second postoperative day.  It is OK to take aspirin right away.     If you are on strong blood thinners (warfarin/Coumadin, Plavix, Xerelto, Eliquis, Pradaxa, etc), discuss with your surgeon, medicine PCP, and/or cardiologist for instructions on when to restart the blood thinner & if blood monitoring is needed (PT/INR blood check, etc).     PAIN CONTROL Pain after surgery or related to activity is often due to strain/injury to muscle, tendon, nerves and/or incisions.  This pain is usually short-term and will improve in a few months.  To help speed the process of healing and to get back to regular activity more quickly, DO THE FOLLOWING THINGS TOGETHER: Increase activity gradually.  DO NOT PUSH THROUGH PAIN Use Ice and/or Heat Try Gentle Massage and/or Stretching Take over the counter pain medication Take Narcotic prescription pain medication for more severe pain  Good pain control = faster recovery.  It is better to take more medicine to be more active than to stay in bed all day to avoid medications.  Increase activity gradually Avoid heavy lifting at first, then increase to lifting as tolerated over the next 6 weeks. Do not "push through" the pain.  Listen to your body and avoid positions and maneuvers than reproduce the pain.  Wait a few days before trying something more intense Walking an hour a day is encouraged to help your body recover faster and more safely.  Start slowly and stop when getting sore.  If you can walk 30 minutes without stopping or pain, you can try more intense activity (running, jogging, aerobics, cycling, swimming, treadmill, sex, sports, weightlifting, etc.) Remember: If it hurts to do it, then don't do it! Use Ice and/or Heat You will have swelling and  bruising around the incisions.  This will take several weeks to resolve. Ice packs or heating pads (6-8 times a day, 30-60 minutes at a time) will help sooth soreness & bruising. Some people prefer to use ice alone, heat alone, or alternate between ice & heat.  Experiment and see what works best for you.  Consider trying ice for the first few days to help decrease swelling and bruising; then, switch to heat to help relax sore spots and speed recovery. Shower every day.  Short baths are fine.  It feels good!  Keep the incisions and wounds clean with soap & water.   Try Gentle Massage and/or Stretching Massage at the area of pain many times a day Stop if you feel pain - do not overdo it Take over the counter pain medication This helps the muscle and nerve tissues become less irritable and calm down faster Choose ONE of the following over-the-counter anti-inflammatory medications: Acetaminophen 500mg tabs (Tylenol) 1-2 pills with every meal and just before bedtime (avoid if you have liver problems or if you have   acetaminophen in you narcotic prescription) Naproxen 220mg tabs (ex. Aleve, Naprosyn) 1-2 pills twice a day (avoid if you have kidney, stomach, IBD, or bleeding problems) Ibuprofen 200mg tabs (ex. Advil, Motrin) 3-4 pills with every meal and just before bedtime (avoid if you have kidney, stomach, IBD, or bleeding problems) Take with food/snack several times a day as directed for at least 2 weeks to help keep pain / soreness down & more manageable. Take Narcotic prescription pain medication for more severe pain A prescription for strong pain control is often given to you upon discharge (for example: oxycodone/Percocet, hydrocodone/Norco/Vicodin, or tramadol/Ultram) Take your pain medication as prescribed. Be mindful that most narcotic prescriptions contain Tylenol (acetaminophen) as well - avoid taking too much Tylenol. If you are having problems/concerns with the prescription medicine (does  not control pain, nausea, vomiting, rash, itching, etc.), please call us [phone] to see if we need to switch you to a different pain medicine that will work better for you and/or control your side effects better. If you need a refill on your pain medication, you must call the office before 4 pm and on weekdays only.  By federal law, prescriptions for narcotics cannot be called into a pharmacy.  They must be filled out on paper & picked up from our office by the patient or authorized caretaker.  Prescriptions cannot be filled after 4 pm nor on weekends.    WHEN TO CALL US [phone] Severe uncontrolled or worsening pain  Fever over 101 F (38.5 C) Concerns with the incision: Worsening pain, redness, rash/hives, swelling, bleeding, or drainage Reactions / problems with new medications (itching, rash, hives, nausea, etc.) Nausea and/or vomiting Difficulty urinating Difficulty breathing Worsening fatigue, dizziness, lightheadedness, blurred vision Other concerns If you are not getting better after two weeks or are noticing you are getting worse, contact our office [phone] for further advice.  We may need to adjust your medications, re-evaluate you in the office, send you to the emergency room, or see what other things we can do to help. The clinic staff is available to answer your questions during regular business hours (8:30am-5pm).  Please don't hesitate to call and ask to speak to one of our nurses for clinical concerns.    A surgeon from [facility] is always on call at the hospitals 24 hours/day If you have a medical emergency, go to the nearest emergency room or call 911.  FOLLOW UP in our office One the day of your discharge from the hospital (or the next business weekday), please call [facility] to set up or confirm an appointment to see your surgeon in the office for a follow-up appointment.  Usually it is 2-3 weeks after your surgery.   If you  have skin staples at your incision(s), let the office know so we can set up a time in the office for the nurse to remove them (usually around 10 days after surgery). Make sure that you call for appointments the day of discharge (or the next business weekday) from the hospital to ensure a convenient appointment time. IF YOU HAVE DISABILITY OR FAMILY LEAVE FORMS, BRING THEM TO THE OFFICE FOR PROCESSING.  DO NOT GIVE THEM TO YOUR DOCTOR.  [facility] [address], [address], [place], [place]  [address] ? [phone] - Main [phone] - Toll Free,  [phone] - Fax [email or web address]    GETTING TO GOOD BOWEL HEALTH. It is expected for your digestive tract to   need a few months to get back to normal.  It is common for your bowel movements and stools to be irregular.  You will have occasional bloating and cramping that should eventually fade away.  Until you are eating solid food normally, off all pain medications, and back to regular activities; your bowels will not be normal.   Avoiding constipation The goal: ONE SOFT BOWEL MOVEMENT A DAY!    Drink plenty of fluids.  Choose water first. TAKE A FIBER SUPPLEMENT EVERY DAY THE REST OF YOUR LIFE During your first week back home, gradually add back a fiber supplement every day Experiment which form you can tolerate.   There are many forms such as powders, tablets, wafers, gummies, etc Psyllium bran (Metamucil), methylcellulose (Citrucel), Miralax or Glycolax, Benefiber, Flax Seed.  Adjust the dose week-by-week (1/2 dose/day to 6 doses a day) until you are moving your bowels 1-2 times a day.  Cut back the dose or try a different fiber product if it is giving you problems such as diarrhea or bloating. Sometimes a laxative is needed to help jump-start bowels if constipated until the fiber supplement can help regulate your bowels.  If you are tolerating eating & you are farting, it is okay to try a gentle  laxative such as double dose MiraLax, prune juice, or Milk of Magnesia.  Avoid using laxatives too often. Stool softeners can sometimes help counteract the constipating effects of narcotic pain medicines.  It can also cause diarrhea, so avoid using for too long. If you are still constipated despite taking fiber daily, eating solids, and a few doses of laxatives, call our office. Controlling diarrhea Try drinking liquids and eating bland foods for a few days to avoid stressing your intestines further. Avoid dairy products (especially milk & ice cream) for a short time.  The intestines often can lose the ability to digest lactose when stressed. Avoid foods that cause gassiness or bloating.  Typical foods include beans and other legumes, cabbage, broccoli, and dairy foods.  Avoid greasy, spicy, fast foods.  Every person has some sensitivity to other foods, so listen to your body and avoid those foods that trigger problems for you. Probiotics (such as active yogurt, Align, etc) may help repopulate the intestines and colon with normal bacteria and calm down a sensitive digestive tract Adding a fiber supplement gradually can help thicken stools by absorbing excess fluid and retrain the intestines to act more normally.  Slowly increase the dose over a few weeks.  Too much fiber too soon can backfire and cause cramping & bloating. It is okay to try and slow down diarrhea with a few doses of antidiarrheal medicines.   Bismuth subsalicylate (ex. Kayopectate, Pepto Bismol) for a few doses can help control diarrhea.  Avoid if pregnant.   Loperamide (Imodium) can slow down diarrhea.  Start with one tablet (2mg) first.  Avoid if you are having fevers or severe pain.  ILEOSTOMY PATIENTS WILL HAVE CHRONIC DIARRHEA since their colon is not in use.    Drink plenty of liquids.  You will need to drink even more glasses of water/liquid a day to avoid getting dehydrated. Record output from your ileostomy.  Expect to empty  the bag every 3-4 hours at first.  Most people with a permanent ileostomy empty their bag 4-6 times at the least.   Use antidiarrheal medicine (especially Imodium) several times a day to avoid getting dehydrated.  Start with a dose at bedtime & breakfast.  Adjust up or   down as needed.  Increase antidiarrheal medications as directed to avoid emptying the bag more than 8 times a day (every 3 hours). Work with your wound ostomy nurse to learn care for your ostomy.  See ostomy care instructions. TROUBLESHOOTING IRREGULAR BOWELS 1) Start with a soft & bland diet. No spicy, greasy, or fried foods.  2) Avoid gluten/wheat or dairy products from diet to see if symptoms improve. 3) Miralax 17gm or flax seed mixed in 8oz. water or juice-daily. May use 2-4 times a day as needed. 4) Gas-X, Phazyme, etc. as needed for gas & bloating.  5) Prilosec (omeprazole) over-the-counter as needed 6)  Consider probiotics (Align, Activa, etc) to help calm the bowels down  Call your doctor if you are getting worse or not getting better.  Sometimes further testing (cultures, endoscopy, X-ray studies, CT scans, bloodwork, etc.) may be needed to help diagnose and treat the cause of the diarrhea. [facility] [address], [address], , [place]  [address] [phone] - Main.    [phone]  - Toll Free.   [phone] - Fax [email or web address]   ###############################   #######################################################  Ostomy Support Information  You've heard that people get along just fine with only one of their eyes, or one of their lungs, or one of their kidneys. But you also know that you have only one intestine and only one bladder, and that leaves you feeling awfully empty, both physically and emotionally: You think no other people go around without part of their intestine with the ends of their intestines sticking out through their abdominal walls.    YOU ARE NOT ALONE.  There are nearly three quarters of a million people in the [place] who have an ostomy; people who have had surgery to remove all or part of their colons or bladders.   There is even a national association, the [employer] with over 350 local affiliated support groups that are organized by volunteers who provide peer support and counseling. [employer] has a toll free telephone num-ber, 800-826-0826 and an educational, interactive website, www.ostomy.org   An ostomy is an opening in the belly (abdominal wall) made by surgery. Ostomates are people who have had this procedure. The opening (stoma) allows the kidney or bowel to grdischarge waste. An external pouch covers the stoma to collect waste. Pouches are are a simple bag and are odor free. Different companies have disposable or reusable pouches to fit one's lifestyle. An ostomy can either be temporary or permanent.   THERE ARE THREE MAIN TYPES OF OSTOMIES Colostomy. A colostomy is a surgically created opening in the large intestine (colon). Ileostomy. An ileostomy is a surgically created opening in the small intestine. Urostomy. A urostomy is a surgically created opening to divert urine away from the bladder.  OSTOMY Care  The following guidelines will make care of your colostomy easier. Keep this information close by for quick reference.  Helpful DIET hints Eat a well-balanced diet including vegetables and fresh fruits. Eat on a regular schedule.  Drink at least 6 to 8 glasses of fluids daily. Eat slowly in a relaxed atmosphere. Chew your food thoroughly. Avoid chewing gum, smoking, and drinking from a straw. This will help decrease the amount of air you swallow, which may help reduce gas. Eating yogurt or drinking buttermilk may help reduce gas.  To control gas at night, do not eat after 8 p.m. This will give your bowel time to quiet down before you go   to bed.  If gas is a problem, you can purchase  Beano. Sprinkle Beano on the first bite of food before eating to reduce gas. It has no flavor and should not change the taste of your food. You can buy Beano over the counter at your local drugstore.  Foods like fish, onions, garlic, broccoli, asparagus, and cabbage produce odor. Although your pouch is odor-proof, if you eat these foods you may notice a stronger odor when emptying your pouch. If this is a concern, you may want to limit these foods in your diet.  If you have an ileostomy, you will have chronic diarrhea & need to drink more liquids to avoid getting dehydrated.  Consider antidiarrheal medicine like imodium (loperamide) or Lomotil to help slow down bowel movements / diarrhea into your ileostomy bag.  GETTING TO GOOD BOWEL HEALTH WITH AN ILEOSTOMY    With the colon bypassed & not in use, you will have small bowel diarrhea.   It is important to thicken & slow your bowel movements down.   The goal: 4-6 small BOWEL MOVEMENTS A DAY It is important to drink plenty of liquids to avoid getting dehydrated  CONTROLLING ILEOSTOMY DIARRHEA  TAKE A FIBER SUPPLEMENT (FiberCon or Benefiner soluble fiber) twice a day - to thicken stools by absorbing excess fluid and retrain the intestines to act more normally.  Slowly increase the dose over a few weeks.  Too much fiber too soon can backfire and cause cramping & bloating.  TAKE AN IRON SUPPLEMENT twice a day to naturally constipate your bowels.  Usually ferrous sulfate 325mg twice a day)  TAKE ANTI-DIARRHEAL MEDICINES: Loperamide (Imodium) can slow down diarrhea.  Start with two tablets (= 4mg) first and then try one tablet every 6 hours.  Can go up to 2 pills four times day (8 pills of 2mg max) Avoid if you are having fevers or severe pain.  If you are not better or start feeling worse, stop all medicines and call your doctor for advice LoMotil (Diphenoxylate / Atropine) is another medicine that can constipate & slow down bowel moevements Pepto  Bismol (bismuth) can gently thicken bowels as well  If diarrhea is worse,: drink plenty of liquids and try simpler foods for a few days to avoid stressing your intestines further. Avoid dairy products (especially milk & ice cream) for a short time.  The intestines often can lose the ability to digest lactose when stressed. Avoid foods that cause gassiness or bloating.  Typical foods include beans and other legumes, cabbage, broccoli, and dairy foods.  Every person has some sensitivity to other foods, so listen to our body and avoid those foods that trigger problems for you.Call your doctor if you are getting worse or not better.  Sometimes further testing (cultures, endoscopy, X-ray studies, bloodwork, etc) may be needed to help diagnose and treat the cause of the diarrhea. Take extra anti-diarrheal medicines (maximum is 8 pills of 2mg loperamide a day)   Tips for POUCHING an OSTOMY   Changing Your Pouch The best time to change your pouch is in the morning, before eating or drinking anything. Your stoma can function at any time, but it will function more after eating or drinking.   Applying the pouching system  Place all your equipment close at hand before removing your pouch.  Wash your hands.  Stand or sit in front of a mirror. Use the position that works best for you. Remember that you must keep the skin around the stoma   wrinkle-free for a good seal.  Gently remove the used pouch (1-piece system) or the pouch and old wafer (2-piece system). Empty the pouch into the toilet. Save the closure clip to use again.  Wash the stoma itself and the skin around the stoma. Your stoma may bleed a little when being washed. This is normal. Rinse and pat dry. You may use a wash cloth or soft paper towels (like Bounty), mild soap (like Dial, Safeguard, or Ivory), and water. Avoid soaps that contain perfumes or lotions.  For a new pouch (1-piece system) or a new wafer (2-piece system), measure your  stoma using the stoma guide in each box of supplies.  Trace the shape of your stoma onto the back of the new pouch or the back of the new wafer. Cut out the opening. Remove the paper backing and set it aside.  Optional: Apply a skin barrier powder to surrounding skin if it is irritated (bare or weeping), and dust off the excess. Optional: Apply a skin-prep wipe (such as Skin Prep or All-Kare) to the skin around the stoma, and let it dry. Do not apply this solution if the skin is irritated (red, tender, or broken) or if you have shaved around the stoma. Optional: Apply a skin barrier paste (such as Stomahesive, Coloplast, or Premium) around the opening cut in the back of the pouch or wafer. Allow it to dry for 30 to 60 seconds.  Hold the pouch (1-piece system) or wafer (2-piece system) with the sticky side toward your body. Make sure the skin around the stoma is wrinkle-free. Center the opening on the stoma, then press firmly to your abdomen (Fig. 4). Look in the mirror to check if you are placing the pouch, or wafer, in the right position. For a 2-piece system, snap the pouch onto the wafer. Make sure it snaps into place securely.  Place your hand over the stoma and the pouch or wafer for about 30 seconds. The heat from your hand can help the pouch or wafer stick to your skin.  Add deodorant (such as Super Banish or Nullo) to your pouch. Other options include food extracts such as vanilla oil and peppermint extract. Add about 10 drops of the deodorant to the pouch. Then apply the closure clamp. Note: Do not use toxic  chemicals or commercial cleaning agents in your pouch. These substances may harm the stoma.  Optional: For extra seal, apply tape to all 4 sides around the pouch or wafer, as if you were framing a picture. You may use any brand of medical adhesive tape. Change your pouch every 5 to 7 days. Change it immediately if a leak occurs.  Wash your hands afterwards.  If you are wearing a  2-piece system, you may use 2 new pouches per week and alternate them. Rinse the pouch with mild soap and warm water and hang it to dry for the next day. Apply the fresh pouch. Alternate the 2 pouches like this for a week. After a week, change the wafer and begin with 2 new pouches. Place the old pouches in a plastic bag, and put them in the trash.   LIVING WITH AN OSTOMY  Emptying Your Pouch Empty your pouch when it is one-third full (of urine, stool, and/or gas). If you wait until your pouch is fuller than this, it will be more difficult to empty and more noticeable. When you empty your pouch, either put toilet paper in the toilet bowl first, or flush the   toilet while you empty the pouch. This will reduce splashing. You can empty the pouch between your legs or to one side while sitting, or while standing or stooping. If you have a 2-piece system, you can snap off the pouch to empty it. Remember that your stoma may function during this time. If you wish to rinse your pouch after you empty it, a turkey baster can be helpful. When using a baster, squirt water up into the pouch through the opening at the bottom. With a 2-piece system, you can snap off the pouch to rinse it. After rinsing  your pouch, empty it into the toilet. When rinsing your pouch at home, put a few granules of Dreft soap in the rinse water. This helps lubricate and freshen your pouch. The inside of your pouch can be sprayed with non-stick cooking oil (Pam spray). This may help reduce stool sticking to the inside of the pouch.  Bathing You may shower or bathe with your pouch on or off. Remember that your stoma may function during this time.  The materials you use to wash your stoma and the skin around it should be clean, but they do not need to be sterile.  Wearing Your Pouch During hot weather, or if you perspire a lot in general, wear a cover over your pouch. This may prevent a rash on your skin under the pouch. Pouch covers are  sold at ostomy supply stores. Wear the pouch inside your underwear for better support. Watch your weight. Any gain or loss of 10 to 15 pounds or more can change the way your pouch fits.  Going Away From Home A collapsible cup (like those that come in travel kits) or a soft plastic squirt bottle with a pull-up top (like a travel bottle for shampoo) can be used for rinsing your pouch when you are away from home. Tilt the opening of the pouch at an upward angle when using a cup to rinse.  Carry wet wipes or extra tissues to use in public bathrooms.  Carry an extra pouching system with you at all times.  Never keep ostomy supplies in the glove compartment of your car. Extreme heat or cold can damage the skin barriers and adhesive wafers on the pouch.  When you travel, carry your ostomy supplies with you at all times. Keep them within easy reach. Do not pack ostomy supplies in baggage that will be checked or otherwise separated from you, because your baggage might be lost. If you're traveling out of the country, it is helpful to have a letter stating that you are carrying ostomy supplies as a medical necessity.  If you need ostomy supplies while traveling, look in the yellow pages of the telephone book under "Surgical Supplies." Or call the local ostomy organization to find out where supplies are available.  Do not let your ostomy supplies get low. Always order new pouches before you use the last one.  Reducing Odor Limit foods such as broccoli, cabbage, onions, fish, and garlic in your diet to help reduce odor. Each time you empty your pouch, carefully clean the opening of the pouch, both inside and outside, with toilet paper. Rinse your pouch 1 or 2 times daily after you empty it (see directions for emptying your pouch and going away from home). Add deodorant (such as Super Banish or Nullo) to your pouch. Use air deodorizers in your bathroom. Do not add aspirin to your pouch. Even though  aspirin can help prevent odor, it   could cause ulcers on your stoma.  When to call the doctor Call the doctor if you have any of the following symptoms: Purple, black, or white stoma Severe cramps lasting more than 6 hours Severe watery discharge from the stoma lasting more than 6 hours No output from the colostomy for 3 days Excessive bleeding from your stoma Swelling of your stoma to more than 1/2-inch larger than usual Pulling inward of your stoma below skin level Severe skin irritation or deep ulcers Bulging or other changes in your abdomen  When to call your ostomy nurse Call your ostomy/enterostomal therapy (WOCN) nurse if any of the following occurs: Frequent leaking of your pouching system Change in size or appearance of your stoma, causing discomfort or problems with your pouch Skin rash or rawness Weight gain or loss that causes problems with your pouch     FREQUENTLY ASKED QUESTIONS   Why haven't you met any of these folks who have an ostomy?  Well, maybe you have! You just did not recognize them because an ostomy doesn't show. It can be kept secret if you wish. Why, maybe some of your best friends, office associates or neighbors have an ostomy ... you never can tell. People facing ostomy surgery have many quality-of-life questions like: Will you bulge? Smell? Make noises? Will you feel waste leaving your body? Will you be a captive of the toilet? Will you starve? Be a social outcast? Get/stay married? Have babies? Easily bathe, go swimming, bend over?  OK, let's look at what you can expect:   Will you bulge?  Remember, without part of the intestine or bladder, and its contents, you should have a flatter tummy than before. You can expect to wear, with little exception, what you wore before surgery ... and this in-cludes tight clothing and bathing suits.   Will you smell?  Today, thanks to modern odor proof pouching systems, you can walk into an ostomy support group  meeting and not smell anything that is foul or offensive. And, for those with an ileostomy or colostomy who are concerned about odor when emptying their pouch, there are in-pouch deodorants that can be used to eliminate any waste odors that may exist.   Will you make noises?  Everyone produces gas, especially if they are an air-swallower. But intestinal sounds that occur from time to time are no differ-ent than a gurgling tummy, and quite often your clothing will muffle any sounds.   Will you feel the waste discharges?  For those with a colostomy or ileostomy there might be a slight pressure when waste leaves your body, but understand that the intestines have no nerve endings, so there will be no unpleasant sensations. Those with a urostomy will probably be unaware of any kidney drainage.   Will you be a captive of the toilet?  Immediately post-op you will spend more time in the bathroom than you will after your body recovers from surgery. Every person is different, but on average those with an ileostomy or urostomy may empty their pouches 4 to 6 times a day; a little  less if you have a colostomy. The average wear time between pouch system changes is 3 to 5 days and the changing process should take less than 30 minutes.   Will I need to be on a special diet? Most people return to their normal diet when they have recovered from surgery. Be sure to chew your food well, eat a well-balanced diet and drink plenty of fluids. If   you experience problems with a certain food, wait a couple of weeks and try it again.  Will there be odor and noises? Pouching systems are designed to be odor-proof or odor-resistant. There are deodorants that can be used in the pouch. Medications are also available to help reduce odor. Limit gas-producing foods and carbonated beverages. You will experience less gas and fewer noises as you heal from surgery.  How much time will it take to care for my ostomy? At first, you may  spend a lot of time learning about your ostomy and how to take care of it. As you become more comfortable and skilled at changing the pouching system, it will take very little time to care for it.   Will I be able to return to work? People with ostomies can perform most jobs. As soon as you have healed from surgery, you should be able to return to work. Heavy lifting (more than 10 pounds) may be discouraged.   What about intimacy? Sexual relationships and intimacy are important and fulfilling aspects of your life. They should continue after ostomy surgery. Intimacy-related concerns should be discussed openly between you and your partner.   Can I wear regular clothing? You do not need to wear special clothing. Ostomy pouches are fairly flat and barely noticeable. Elastic undergarments will not hurt the stoma or prevent the ostomy from functioning.   Can I participate in sports? An ostomy should not limit your involvement in sports. Many people with ostomies are runners, skiers, swimmers or participate in other active lifestyles. Talk with your caregiver first before doing heavy physical activity.  Will you starve?  Not if you follow doctor's orders at each stage of your post-op adjustment. There is no such thing as an "ostomy diet". Some people with an ostomy will be able to eat and tolerate anything; others may find diffi-culty with some foods. Each person is an individual and must determine, by trial, what is best for them. A good practice for all is to drink plenty of water.   Will you be a social outcast?  Have you met anyone who has an ostomy and is a social outcast? Why should you be the first? Only your attitude and self image will effect how you are treated. No confi-dent person is an outcast.    PROFESSIONAL HELP   Resources are available if you need help or have questions about your ostomy.   Specially trained nurses called Wound, Ostomy Continence Nurses (WOCN) are available for  consultation in most major medical centers.  Consider getting an ostomy consult at an outpatient ostomy clinic.   [facility] has an Ostomy Clinic run by an WOCN ostomy nurse at the [facility].  [phone]. [facility] can help set up an appointment   The United Ostomy Association (UOA) is a group made up of many local chapters throughout the [place]. These local groups hold meetings and provide support to prospective and existing ostomates. They sponsor educational events and have qualified visitors to make personal or telephone visits. Contact the UOA for the chapter nearest you and for other educational publications.  More detailed information can be found in Colostomy Guide, a publication of the United Ostomy Association (UOA). Contact UOA at 1-800-826-0826 or visit their web site at www.uoaa.org. The website contains links to other sites, suppliers and resources.  [facility] Secure Start Services: Start at the website to enlist for support.  Your Wound Ostomy (WOCN) nurse may have started this   process. https://www.hollister.com/en/securestart Secure Start services are designed to support people as they live their lives with an ostomy or neurogenic bladder. Enrolling is easy and at no cost to the patient. We realize that each person's needs and life journey are different. Through Secure Start services, we want to help people live their life, their way.  #######################################################  

## 2021-04-26 NOTE — Progress Notes (Signed)
?  Transition of Care (TOC) Screening Note ? ? ?Patient Details  ?Name: Fernando Dean ?Date of Birth: July 27, 1950 ? ? ?Transition of Care (TOC) CM/SW Contact:    ?Ragena Fiola, LCSW ?Phone Number: ?04/26/2021, 10:32 AM ? ? ? ?Transition of Care Department [facility]) has reviewed patient and no TOC needs have been identified at this time. We will continue to monitor patient advancement through interdisciplinary progression rounds. If new patient transition needs arise, please place a TOC consult. ? ? ?

## 2021-05-06 LAB — SURGICAL PATHOLOGY

## 2021-05-10 ENCOUNTER — Ambulatory Visit: Payer: Medicare Other

## 2021-05-11 ENCOUNTER — Inpatient Hospital Stay (HOSPITAL_COMMUNITY): Payer: Medicare Other | Attending: Hematology | Admitting: Hematology

## 2021-05-11 ENCOUNTER — Other Ambulatory Visit: Payer: Self-pay

## 2021-05-11 ENCOUNTER — Encounter (HOSPITAL_COMMUNITY): Payer: Self-pay | Admitting: Hematology

## 2021-05-11 ENCOUNTER — Encounter (HOSPITAL_COMMUNITY): Payer: Self-pay

## 2021-05-11 DIAGNOSIS — C186 Malignant neoplasm of descending colon: Secondary | ICD-10-CM | POA: Insufficient documentation

## 2021-05-11 DIAGNOSIS — D631 Anemia in chronic kidney disease: Secondary | ICD-10-CM | POA: Diagnosis not present

## 2021-05-11 DIAGNOSIS — C185 Malignant neoplasm of splenic flexure: Secondary | ICD-10-CM | POA: Insufficient documentation

## 2021-05-11 DIAGNOSIS — R911 Solitary pulmonary nodule: Secondary | ICD-10-CM | POA: Diagnosis not present

## 2021-05-11 DIAGNOSIS — I129 Hypertensive chronic kidney disease with stage 1 through stage 4 chronic kidney disease, or unspecified chronic kidney disease: Secondary | ICD-10-CM | POA: Diagnosis not present

## 2021-05-11 DIAGNOSIS — N189 Chronic kidney disease, unspecified: Secondary | ICD-10-CM | POA: Diagnosis not present

## 2021-05-11 LAB — FOLATE: Folate: 30 ng/mL (ref >5.9)

## 2021-05-11 LAB — IRON AND TIBC
Iron: 34 ug/dL — ABNORMAL LOW (ref 45–182)
Saturation Ratios: 7 % — ABNORMAL LOW (ref 17.9–39.5)
TIBC: 505 ug/dL — ABNORMAL HIGH (ref 250–450)
UIBC: 471 ug/dL

## 2021-05-11 LAB — RETICULOCYTES
Immature Retic Fract: 10.6 % (ref 2.3–15.9)
RBC.: 3.95 MIL/uL — ABNORMAL LOW (ref 4.22–5.81)
Retic Count, Absolute: 60 10*3/uL (ref 19.0–186.0)
Retic Ct Pct: 1.5 % (ref 0.4–3.1)

## 2021-05-11 LAB — LACTATE DEHYDROGENASE: LDH: 141 U/L (ref 98–192)

## 2021-05-11 LAB — FERRITIN: Ferritin: 25 ng/mL (ref 24–336)

## 2021-05-11 LAB — VITAMIN B12: Vitamin B-12: 677 pg/mL (ref 180–914)

## 2021-05-11 NOTE — Progress Notes (Signed)
I met with patient and family today during initial visit with Dr. Delton Coombes. I introduced myself and explained my role in the patient's care. I provided my contact information and encouraged the patient and family to call with questions or concerns. ?

## 2021-05-11 NOTE — Progress Notes (Signed)
Guardant Reveal testing sent out per Dr. Tomie China request. Labs drawn from patient's [facility] using 23G butterfly needle. Patient tolerated well without complaint. Patient discharged from clinic ambulatory with family present. ?

## 2021-05-11 NOTE — Progress Notes (Signed)
? ?[facility] ?[address] ?[place], [place] [address] ? ? ?CLINIC:  ?Medical Oncology/Hematology ? ?CONSULT NOTE ? ?Patient Care Team: ?Lindell Spar, MD as PCP - General (Internal Medicine) ?Satira Sark, MD as PCP - Cardiology (Cardiology) ?Derek Jack, MD as Medical Oncologist (Medical Oncology) ?Brien Mates, RN as Oncology Nurse Navigator (Medical Oncology) ? ?CHIEF COMPLAINTS/PURPOSE OF CONSULTATION:  ?Evaluation of colon cancer ? ?HISTORY OF PRESENTING ILLNESS:  ?[name] 71 y.o. male is here because of evaluation of colon cancer, at the request of Dr. Marcello Moores. ? ?Today he reports feeling good. He reports he had small amounts of blood in his stool prior to his colonoscopy. He has lsot 2-3 lbs since surgery, but he otherwise denies recent significant weight loss. He denies tingling/numbness. ? ?He currently lives at home, and he is able to do his typical daily activities independently. He currently is still driving. 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 He currently smokes 1/2 ppd and has smoked for 50 years. His mother had colon cancer at age 79, and his maternal grandmother had colon cancer.  ? ?MEDICAL HISTORY:  ?Past Medical History:  ?Diagnosis Date  ? AAA (abdominal aortic aneurysm)   ? Alcohol use   ? Aortic regurgitation   ? moderate AR 02/05/21 echo  ? Chronic kidney disease   ? stage 3  ? Colonic mass   ? Heme positive stool   ? Hypertension   ? Presence of permanent cardiac pacemaker 02/08/2021  ? 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 Tuberculosis   ? as a child, was treated  ? ? ?SURGICAL HISTORY: ?Past Surgical History:  ?Procedure Laterality Date  ? ABDOMINAL AORTIC ENDOVASCULAR STENT GRAFT Bilateral 02/26/2021  ? Procedure: ABDOMINAL AORTIC ENDOVASCULAR STENT GRAFT REPAIR;  Surgeon: Marty Heck, MD;  Location: [facility];  Service: Vascular;  Laterality: Bilateral;  ? BIOPSY  03/12/2021  ?  Procedure: BIOPSY;  Surgeon: [name], [name], MD;  [facility];  Service: Gastroenterology;;  ? COLONOSCOPY WITH PROPOFOL N/A 03/12/2021  ? Procedure: COLONOSCOPY WITH PROPOFOL;  Surgeon: Harvel Quale, MD;  [facility];  Service: Gastroenterology;  Laterality: N/A;  10:55  ? HEMOSTASIS CLIP PLACEMENT  03/12/2021  ? Procedure: HEMOSTASIS CLIP PLACEMENT;  Surgeon: Harvel Quale, MD;  [facility];  Service: Gastroenterology;;  ? HERNIA REPAIR Left 10/25/2006  ? PACEMAKER IMPLANT N/A 02/08/2021  ? Procedure: PACEMAKER IMPLANT;  Surgeon: Deboraha Sprang, MD;  Location: [facility];  Service: Cardiovascular;  Laterality: N/A;  ? POLYPECTOMY  03/12/2021  ? Procedure: POLYPECTOMY;  Surgeon: Harvel Quale, MD;  [facility];  Service: Gastroenterology;;  ? SCLEROTHERAPY  03/12/2021  ? Procedure: SCLEROTHERAPY;  Surgeon: [name], [name], MD;  [facility];  Service: Gastroenterology;;  ? ? ?SOCIAL HISTORY: ?Social History  ? ?Socioeconomic History  ? Marital status: Single  ?  Spouse name: Not on file  ? Number of children: Not on file  ? Years of education: Not on file  ? Highest education level: Not on file  ?Occupational History  ? Not on file  ?Tobacco Use  ? Smoking status: Every Day  ?  Packs/day: 0.75  ?  Years: 50.00  ?  Pack years: 37.50  ?  Types: Cigarettes  ? Smokeless tobacco: Never  ?Vaping Use  ? Vaping Use: Never used  ?Substance and  Sexual Activity  ? Alcohol use: Yes  ?  Alcohol/week: 2.0 - 3.0 standard drinks  ?  Types: 2 - 3 Shots of liquor per week  ?  Comment: daily liquor moderatly  ? Drug use: Not on file  ?  Comment: Last use 02/21/21  ? Sexual activity: Not Currently  ?Other Topics Concern  ? Not on file  ?Social History Narrative  ? Not on file  ? ?Social Determinants of Health  ? ?Financial Resource Strain: Not on file  ?Food Insecurity: Not on file  ?Transportation Needs: Not on file   ?Physical Activity: Not on file  ?Stress: Not on file  ?Social Connections: Not on file  ?Intimate Partner Violence: Not on file  ? ? ?FAMILY HISTORY: ?Family History  ?Problem Relation Age of Onset  ? Bladder Cancer Mother   ? Heart disease Father   ? ? ?ALLERGIES:  ?is allergic to advil [ibuprofen], aleve [naproxen], and nsaids. ? ?MEDICATIONS:  ?Current Outpatient Medications  ?Medication Sig Dispense Refill  ? amLODipine (NORVASC) 10 MG tablet Take 1 tablet (10 mg total) by mouth daily. 30 tablet 3  ? aspirin EC 81 MG EC tablet Take 1 tablet (81 mg total) by mouth daily at 6 (six) AM. Swallow whole. 30 tablet 11  ? atorvastatin (LIPITOR) 40 MG tablet Take 1 tablet (40 mg total) by mouth daily. 90 tablet 3  ? carvedilol (COREG) 6.25 MG tablet Take 1 tablet (6.25 mg total) by mouth 2 (two) times daily with a meal. 60 tablet 3  ? Multiple Vitamins-Minerals (MULTIVITAMIN WITH MINERALS) tablet Take 1 tablet by mouth daily. Centrum Silver for Men 50+    ? nicotine (NICODERM CQ - DOSED IN MG/24 HOURS) 21 mg/24hr patch Place 21 mg onto the skin daily.    ? traMADol (ULTRAM) 50 MG tablet Take 1 tablet (50 mg total) by mouth every 6 (six) hours as needed for moderate pain. 20 tablet 0  ? ?No current facility-administered medications for this visit.  ? ? ?REVIEW OF SYSTEMS:   ?Review of Systems  ?Constitutional:  Negative for appetite change, fatigue and unexpected weight change.  ?Respiratory:  Positive for cough and shortness of breath.   ?Gastrointestinal:  Positive for abdominal pain and blood in stool.  ?Neurological:  Negative for numbness.  ?All other systems reviewed and are negative. ? ? ?PHYSICAL EXAMINATION: ?ECOG PERFORMANCE STATUS: 1 - Symptomatic but completely ambulatory ? ?Vitals:  ? 05/11/21 0821  ?BP: 133/75  ?Pulse: (!) 52  ?Resp: 18  ?Temp: (!) 96.6 ?F (35.9 ?C)  ?SpO2: 97%  ? ?Filed Weights  ? 05/11/21 0347  ?Weight: 170 lb 8 oz (77.3 kg)  ? ?Physical Exam ?Vitals reviewed.  ?Constitutional:   ?    Appearance: Normal appearance.  ?Cardiovascular:  ?   Rate and Rhythm: Normal rate and regular rhythm.  ?   Pulses: Normal pulses.  ?   Heart sounds: Normal heart sounds.  ?Pulmonary:  ?   Effort: Pulmonary effort is normal.  ?   Breath sounds: Normal breath sounds.  ?Abdominal:  ?   Palpations: Abdomen is soft. There is no hepatomegaly, splenomegaly or mass.  ?   Tenderness: There is no abdominal tenderness.  ?Musculoskeletal:  ?   Right lower leg: No edema.  ?   Left lower leg: No edema.  ?Neurological:  ?   General: No focal deficit present.  ?   Mental Status: He is alert and oriented to person, place, and time.  ?Psychiatric:     ?  Mood and Affect: Mood normal.     ?   Behavior: Behavior normal.  ? ? ? ?LABORATORY DATA:  ?I have reviewed the data as listed ?CBC Latest Ref Rng & Units 04/26/2021 04/25/2021 04/24/2021  ?WBC 4.0 - 10.5 K/uL 9.7 13.4(H) 20.0(H)  ?Hemoglobin 13.0 - 17.0 g/dL 10.0(L) 9.4(L) 9.6(L)  ?Hematocrit 39.0 - 52.0 % 32.4(L) 30.4(L) 30.8(L)  ?Platelets 150 - 400 K/uL 232 209 190  ? ?CMP Latest Ref Rng & Units 04/26/2021 04/25/2021 04/24/2021  ?Glucose 70 - 99 mg/dL 88 87 132(H)  ?BUN 8 - 23 mg/dL '17 20 18  '$ ?Creatinine 0.61 - 1.24 mg/dL 1.59(H) 1.73(H) 1.80(H)  ?Sodium 135 - 145 mmol/L 136 134(L) 130(L)  ?Potassium 3.5 - 5.1 mmol/L 4.1 4.1 4.4  ?Chloride 98 - 111 mmol/L 105 105 103  ?CO2 22 - 32 mmol/L 22 24 21(L)  ?Calcium 8.9 - 10.3 mg/dL 9.1 8.8(L) 8.7(L)  ?Total Protein 6.5 - 8.1 g/dL - - -  ?Total Bilirubin 0.3 - 1.2 mg/dL - - -  ?Alkaline Phos 38 - 126 U/L - - -  ?AST 15 - 41 U/L - - -  ?ALT 0 - 44 U/L - - -  ? ? ?RADIOGRAPHIC STUDIES: ?I have personally reviewed the radiological images as listed and agreed with the findings in the report. ?No results found. ? 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 ?- Colonoscopy on 03/12/2021: Fungating partially obstructing large mass found at 40 cm proximal to the anus, circumferential with no  bleeding present. ?- CEA on 03/12/2021: 8.3 ?- CT angio abdomen and pelvis on 04/08/2021: No evidence of metastatic lymphadenopathy or liver mets.  Stable 7 mm subpleural pulmonary nodule in the periphery of the ri

## 2021-05-11 NOTE — Patient Instructions (Addendum)
[facility] at [facility] ?Discharge Instructions ? ?You were seen and examined today by Dr. Delton Coombes. Dr. Delton Coombes is a medical oncologist, meaning that he specializes in the treatment of cancer diagnoses. Dr. Delton Coombes discussed your past medical history, family history of cancers, and the events that led to you being here today. ? ?You were referred to Dr. Delton Coombes by Dr. Marcello Moores due to a recent diagnosis of Stage II Colon Cancer. It was completely removed during surgery and there was no lymph node involvement. ? ?Dr. Delton Coombes has recommended additional lab work today. One is a tumor marker, known as CEA. This is specific to colon cancer and was elevated prior to surgery, Dr. Delton Coombes has recommended a repeat it to ensure it is trending down. The other lab is known as [facility]. This detects cell-free cancer DNA that may be floating in your blood stream. ? ?Dr. Delton Coombes has also recommended that you meet with a genetic counselor due to your diagnosis as well as your family history. ? ?Follow-up with Dr. Delton Coombes in a few weeks to discuss results. ? ? ?Thank you for choosing [facility] at [facility] to provide your oncology and hematology care.  To afford each patient quality time with our provider, please arrive at least 15 minutes before your scheduled appointment time.  ? ?If you have a lab appointment with the [facility] please come in thru the Main Entrance and check in at the main information desk. ? ?You need to re-schedule your appointment should you arrive 10 or more minutes late.  We strive to give you quality time with our providers, and arriving late affects you and other patients whose appointments are after yours.  Also, if you no show three or more times for appointments you may be dismissed from the clinic at the providers discretion.     ?Again, thank you for choosing [facility].  Our hope is that these requests  will decrease the amount of time that you wait before being seen by our physicians.       ?_____________________________________________________________ ? ?Should you have questions after your visit to [facility], please contact our office at [phone] and follow the prompts.  Our office hours are 8:00 a.m. and 4:30 p.m. Monday - Friday.  Please note that voicemails left after 4:00 p.m. may not be returned until the following business day.  We are closed weekends and major holidays.  You do have access to a nurse 24-7, just call the main number to the clinic [phone] and do not press any options, hold on the line and a nurse will answer the phone.   ? ?For prescription refill requests, have your pharmacy contact our office and allow 72 hours.   ? ?Due to Covid, you will need to wear a mask upon entering the hospital. If you do not have a mask, a mask will be given to you at the Main Entrance upon arrival. For doctor visits, patients may have 1 support person age 79 or older with them. For treatment visits, patients can not have anyone with them due to social distancing guidelines and our immunocompromised population.  ? ? ? ?

## 2021-05-12 LAB — CEA: CEA: 11.4 ng/mL — ABNORMAL HIGH (ref 0.0–4.7)

## 2021-05-13 LAB — METHYLMALONIC ACID, SERUM: Methylmalonic Acid, Quantitative: 289 nmol/L (ref 0–378)

## 2021-05-18 ENCOUNTER — Encounter: Payer: Self-pay | Admitting: Internal Medicine

## 2021-05-18 ENCOUNTER — Ambulatory Visit (INDEPENDENT_AMBULATORY_CARE_PROVIDER_SITE_OTHER): Payer: Medicare Other | Admitting: Internal Medicine

## 2021-05-18 ENCOUNTER — Other Ambulatory Visit: Payer: Self-pay

## 2021-05-18 VITALS — BP 138/74 | HR 73 | Resp 16 | Ht 72.0 in | Wt 166.8 lb

## 2021-05-18 DIAGNOSIS — I10 Essential (primary) hypertension: Secondary | ICD-10-CM | POA: Diagnosis not present

## 2021-05-18 DIAGNOSIS — Z720 Tobacco use: Secondary | ICD-10-CM

## 2021-05-18 DIAGNOSIS — D509 Iron deficiency anemia, unspecified: Secondary | ICD-10-CM | POA: Diagnosis not present

## 2021-05-18 DIAGNOSIS — Z8679 Personal history of other diseases of the circulatory system: Secondary | ICD-10-CM | POA: Diagnosis not present

## 2021-05-18 DIAGNOSIS — K6389 Other specified diseases of intestine: Secondary | ICD-10-CM | POA: Diagnosis not present

## 2021-05-18 DIAGNOSIS — F17210 Nicotine dependence, cigarettes, uncomplicated: Secondary | ICD-10-CM

## 2021-05-18 DIAGNOSIS — J432 Centrilobular emphysema: Secondary | ICD-10-CM | POA: Diagnosis not present

## 2021-05-18 DIAGNOSIS — I442 Atrioventricular block, complete: Secondary | ICD-10-CM | POA: Diagnosis not present

## 2021-05-18 DIAGNOSIS — Z9889 Other specified postprocedural states: Secondary | ICD-10-CM | POA: Diagnosis not present

## 2021-05-18 DIAGNOSIS — N1831 Chronic kidney disease, stage 3a: Secondary | ICD-10-CM

## 2021-05-18 MED ORDER — ALBUTEROL SULFATE HFA 108 (90 BASE) MCG/ACT IN AERS
2.0000 | INHALATION_SPRAY | Freq: Four times a day (QID) | RESPIRATORY_TRACT | 2 refills | Status: AC | PRN
Start: 2021-05-18 — End: ?

## 2021-05-18 NOTE — Assessment & Plan Note (Signed)
Last BMP reviewed ?Advised to increase fluid intake ?Avoid nephrotoxic agents ?

## 2021-05-18 NOTE — Assessment & Plan Note (Signed)
BP Readings from Last 1 Encounters:  ?05/18/21 138/74  ? ?Well-controlled with amlodipine and Coreg ?Counseled for compliance with the medications ?Advised DASH diet and moderate exercise/walking, at least 150 mins/week ?

## 2021-05-18 NOTE — Assessment & Plan Note (Signed)
Noted on CT chest ?Complains of dyspnea on exertion, could be due to his cardiac etiology and anemia ?Started albuterol as needed for now ?

## 2021-05-18 NOTE — Assessment & Plan Note (Signed)
Had complete heart block with bradycardia, s/p pacemaker placement in 2022 ?Followed by [facility] cardiology ?

## 2021-05-18 NOTE — Assessment & Plan Note (Signed)
Smokes about 0.5 pack/day  Asked about quitting: confirms that he/she currently smokes cigarettes Advise to quit smoking: Educated about QUITTING to reduce the risk of cancer, cardio and cerebrovascular disease. Assess willingness: Unwilling to quit at this time, but is working on cutting back. Assist with counseling and pharmacotherapy: Counseled for 5 minutes and literature provided. Arrange for follow up: follow up in 3 months and continue to offer help. 

## 2021-05-18 NOTE — Patient Instructions (Signed)
Please start using Albuterol inhaler as needed for shortness of breath or wheezing. ? ?Please continue to take other medications as prescribed. ? ?Please continue to follow low salt diet and ambulate as tolerated. ? ?Please try to cut down -> quit smoking. ?

## 2021-05-18 NOTE — Assessment & Plan Note (Signed)
Has history of colon cancer, followed by GI, oncology and general surgery ?Undergoing evaluation for further treatment/surveillance ?

## 2021-05-18 NOTE — Assessment & Plan Note (Signed)
Has history of infrarenal AAA, followed by vascular surgery ?

## 2021-05-18 NOTE — Assessment & Plan Note (Signed)
Likely due to CKD and/or recent surgical procedure ?Followed by heme-onc ?Will need iron supplements ?

## 2021-05-18 NOTE — Progress Notes (Signed)
? ?New Patient Office Visit ? ?Subjective:  ?Patient ID: Fernando Dean, male    DOB: 11/13/1950  Age: 71 y.o. MRN: [id number] ? ?CC:  ?Chief Complaint  ?Patient presents with  ? Establish Care  ? mobility   ?  Concerned because he was walking a mile everyday and since his cardiac issues he can only walk to the end of the driveway.   ? ? ?HPI ?Fernando Dean is a 71 y.o. male with past medical history of HTN, complete heart block s/p pacemaker placement, AAA, colon colon cancer s/p resection, CKD and tobacco abuse who presents for establishing care. ? ?HTN: BP is well-controlled. Takes medications regularly. Patient denies headache, dizziness, chest pain, or palpitations.  He has had pacemaker placed for history of complete heart block. ? ?He complains of exertional dyspnea, which has been worse since his surgery.  Chart review suggests history of emphysema, noted on CT chest.  Of note, he has been noted to have microcytic anemia, for which he is followed by heme-onc.  He currently denies any melena or hematochezia. ? ?He has had endovascular stent graft repair of infrarenal AAA. ? ?He had robotic colon -splenic flexure resection for colon mass, likely colon cancer about 3 weeks ago.  He is followed by GI, oncology and general surgery for it. ? ? ? ? ? ?Past Medical History:  ?Diagnosis Date  ? AAA (abdominal aortic aneurysm)   ? Alcohol use   ? Aortic regurgitation   ? moderate AR 02/05/21 echo  ? Chronic kidney disease   ? stage 3  ? Colonic mass   ? Heme positive stool   ? Hypertension   ? Presence of permanent cardiac pacemaker 02/08/2021  ? 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 Tuberculosis   ? as a child, was treated  ? ? ?Past Surgical History:  ?Procedure Laterality Date  ? ABDOMINAL AORTIC ENDOVASCULAR STENT GRAFT Bilateral 02/26/2021  ? Procedure: ABDOMINAL AORTIC ENDOVASCULAR STENT GRAFT REPAIR;  Surgeon: Marty Heck, MD;  Location: [place];  Service:  Vascular;  Laterality: Bilateral;  ? BIOPSY  03/12/2021  ? Procedure: BIOPSY;  Surgeon: [name], [name], MD;  [facility];  Service: Gastroenterology;;  ? COLONOSCOPY WITH PROPOFOL N/A 03/12/2021  ? Procedure: COLONOSCOPY WITH PROPOFOL;  Surgeon: Harvel Quale, MD;  [facility];  Service: Gastroenterology;  Laterality: N/A;  10:55  ? HEMOSTASIS CLIP PLACEMENT  03/12/2021  ? Procedure: HEMOSTASIS CLIP PLACEMENT;  Surgeon: Harvel Quale, MD;  [facility];  Service: Gastroenterology;;  ? HERNIA REPAIR Left 10/25/2006  ? PACEMAKER IMPLANT N/A 02/08/2021  ? Procedure: PACEMAKER IMPLANT;  Surgeon: Deboraha Sprang, MD;  Location: [place] [facility];  Service: Cardiovascular;  Laterality: N/A;  ? POLYPECTOMY  03/12/2021  ? Procedure: POLYPECTOMY;  Surgeon: Harvel Quale, MD;  [facility];  Service: Gastroenterology;;  ? SCLEROTHERAPY  03/12/2021  ? Procedure: SCLEROTHERAPY;  Surgeon: [name], [name], MD;  [facility];  Service: Gastroenterology;;  ? ? ?Family History  ?Problem Relation Age of Onset  ? Bladder Cancer Mother   ? Heart disease Father   ? ? ?Social History  ? ?Socioeconomic History  ? Marital status: Single  ?  Spouse name: Not on file  ? Number of children: Not on file  ? Years of education: Not on file  ? Highest education level: Not on file  ?Occupational History  ?  Not on file  ?Tobacco Use  ? Smoking status: Every Day  ?  Packs/day: 0.75  ?  Years: 50.00  ?  Pack years: 37.50  ?  Types: Cigarettes  ?  Passive exposure: Current  ? Smokeless tobacco: Never  ?Vaping Use  ? Vaping Use: Never used  ?Substance and Sexual Activity  ? Alcohol use: Yes  ?  Alcohol/week: 2.0 - 3.0 standard drinks  ?  Types: 2 - 3 Shots of liquor per week  ?  Comment: daily liquor moderatly  ? Drug use: Not on file  ?  Comment: Last use 02/21/21  ? Sexual activity: Not Currently  ?Other Topics Concern  ? Not on file  ?Social  History Narrative  ? Not on file  ? ?Social Determinants of Health  ? ?Financial Resource Strain: Not on file  ?Food Insecurity: Not on file  ?Transportation Needs: Not on file  ?Physical Activity: Not on file  ?Stress: Not on file  ?Social Connections: Not on file  ?Intimate Partner Violence: Not on file  ? ? ?ROS ?Review of Systems  ?Constitutional:  Positive for fatigue. Negative for chills and fever.  ?HENT:  Negative for congestion and sore throat.   ?Eyes:  Negative for pain and discharge.  ?Respiratory:  Positive for shortness of breath. Negative for cough.   ?Cardiovascular:  Negative for chest pain and palpitations.  ?Gastrointestinal:  Negative for diarrhea, nausea and vomiting.  ?Endocrine: Negative for polydipsia and polyuria.  ?Genitourinary:  Negative for dysuria and hematuria.  ?Musculoskeletal:  Negative for neck pain and neck stiffness.  ?Skin:  Negative for rash.  ?Neurological:  Negative for dizziness, weakness, numbness and headaches.  ?Psychiatric/Behavioral:  Negative for agitation and behavioral problems.   ? ?Objective:  ? ?Today's Vitals: BP 138/74 (BP Location: Right Arm)   Pulse 73   Resp 16   Ht 6' (1.829 m)   Wt 166 lb 12.8 oz (75.7 kg)   SpO2 99%   BMI 22.62 kg/m?  ? ?Physical Exam ?Vitals reviewed.  ?Constitutional:   ?   General: He is not in acute distress. ?   Appearance: He is not diaphoretic.  ?HENT:  ?   Head: Normocephalic and atraumatic.  ?   Nose: Nose normal.  ?   Mouth/Throat:  ?   Mouth: Mucous membranes are moist.  ?Eyes:  ?   General: No scleral icterus. ?   Extraocular Movements: Extraocular movements intact.  ?Cardiovascular:  ?   Rate and Rhythm: Normal rate and regular rhythm.  ?   Pulses: Normal pulses.  ?   Heart sounds: Normal heart sounds. No murmur heard. ?Pulmonary:  ?   Breath sounds: Normal breath sounds. No wheezing or rales.  ?Abdominal:  ?   Palpations: Abdomen is soft.  ?   Tenderness: There is no abdominal tenderness.  ?Musculoskeletal:  ?    Cervical back: Neck supple. No tenderness.  ?   Right lower leg: No edema.  ?   Left lower leg: No edema.  ?Skin: ?   General: Skin is warm.  ?   Findings: No rash.  ?Neurological:  ?   General: No focal deficit present.  ?   Mental Status: He is alert and oriented to person, place, and time.  ?   Sensory: No sensory deficit.  ?   Motor: No weakness.  ?Psychiatric:     ?   Mood and Affect: Mood normal.     ?   Behavior: Behavior normal.  ? ? ?  Assessment & Plan:  ? ?Problem List Items Addressed This Visit   ? ?  ? Cardiovascular and Mediastinum  ? Essential hypertension  ?  BP Readings from Last 1 Encounters:  ?05/18/21 138/74  ?Well-controlled with amlodipine and Coreg ?Counseled for compliance with the medications ?Advised DASH diet and moderate exercise/walking, at least 150 mins/week ?  ?  ? Complete heart block ([place])  ?  Had complete heart block with bradycardia, s/p pacemaker placement in 2022 ?Followed by EP cardiology ?  ?  ?  ? Respiratory  ? Centrilobular emphysema ([place])  ?  Noted on CT chest ?Complains of dyspnea on exertion, could be due to his cardiac etiology and anemia ?Started albuterol as needed for now ?  ?  ? Relevant Medications  ? albuterol (VENTOLIN HFA) 108 (90 Base) MCG/ACT inhaler  ?  ? Genitourinary  ? CKD (chronic kidney disease), stage III ([place])  ?  Last BMP reviewed ?Advised to increase fluid intake ?Avoid nephrotoxic agents ?  ?  ?  ? Other  ? Tobacco abuse  ?  Smokes about 0.5 pack/day ? ?Asked about quitting: confirms that he/she currently smokes cigarettes ?Advise to quit smoking: Educated about QUITTING to reduce the risk of cancer, cardio and cerebrovascular disease. ?Assess willingness: Unwilling to quit at this time, but is working on cutting back. ?Assist with counseling and pharmacotherapy: Counseled for 5 minutes and literature provided. ?Arrange for follow up: follow up in 3 months and continue to offer help. ?  ?  ? S/P AAA repair  ?  Has history of infrarenal AAA, followed  by vascular surgery ?  ?  ? Colonic mass s/p robotic splenic flexure resection 04/23/2021 - Primary  ?  Has history of colon cancer, followed by GI, oncology and general surgery ?Undergoing evaluation for further treatme

## 2021-05-20 ENCOUNTER — Ambulatory Visit (INDEPENDENT_AMBULATORY_CARE_PROVIDER_SITE_OTHER): Payer: Medicare Other

## 2021-05-20 DIAGNOSIS — I442 Atrioventricular block, complete: Secondary | ICD-10-CM | POA: Diagnosis not present

## 2021-05-20 LAB — CUP PACEART REMOTE DEVICE CHECK
Battery Remaining Longevity: 126 mo
Battery Remaining Percentage: 95.5 %
Battery Voltage: 3.02 V
Brady Statistic AP VP Percent: 20 %
Brady Statistic AP VS Percent: 1 %
Brady Statistic AS VP Percent: 78 %
Brady Statistic AS VS Percent: 1 %
Brady Statistic RA Percent Paced: 18 %
Brady Statistic RV Percent Paced: 98 %
Date Time Interrogation Session: 20230320020015
Implantable Lead Implant Date: 20221219
Implantable Lead Implant Date: 20221219
Implantable Lead Location: 753859
Implantable Lead Location: 753860
Implantable Pulse Generator Implant Date: 20221219
Lead Channel Impedance Value: 450 Ohm
Lead Channel Impedance Value: 550 Ohm
Lead Channel Pacing Threshold Amplitude: 0.5 V
Lead Channel Pacing Threshold Amplitude: 0.75 V
Lead Channel Pacing Threshold Pulse Width: 0.5 ms
Lead Channel Pacing Threshold Pulse Width: 0.5 ms
Lead Channel Sensing Intrinsic Amplitude: 11.7 mV
Lead Channel Sensing Intrinsic Amplitude: 2.8 mV
Lead Channel Setting Pacing Amplitude: 1 V
Lead Channel Setting Pacing Amplitude: 1.5 V
Lead Channel Setting Pacing Pulse Width: 0.5 ms
Lead Channel Setting Sensing Sensitivity: 2 mV
Pulse Gen Model: 2272
Pulse Gen Serial Number: 3985814

## 2021-05-21 DIAGNOSIS — C186 Malignant neoplasm of descending colon: Secondary | ICD-10-CM | POA: Diagnosis not present

## 2021-05-23 DIAGNOSIS — E785 Hyperlipidemia, unspecified: Secondary | ICD-10-CM | POA: Diagnosis not present

## 2021-05-24 ENCOUNTER — Encounter: Payer: Medicare Other | Admitting: Internal Medicine

## 2021-05-24 ENCOUNTER — Other Ambulatory Visit: Payer: Medicare Other | Admitting: *Deleted

## 2021-05-24 DIAGNOSIS — E785 Hyperlipidemia, unspecified: Secondary | ICD-10-CM

## 2021-05-24 LAB — HEPATIC FUNCTION PANEL
ALT: 10 IU/L (ref 0–44)
AST: 16 IU/L (ref 0–40)
Albumin: 4 g/dL (ref 3.8–4.8)
Alkaline Phosphatase: 176 IU/L — ABNORMAL HIGH (ref 44–121)
Bilirubin Total: 0.4 mg/dL (ref 0.0–1.2)
Bilirubin, Direct: 0.15 mg/dL (ref 0.00–0.40)
Total Protein: 7.5 g/dL (ref 6.0–8.5)

## 2021-05-24 LAB — LIPID PANEL
Chol/HDL Ratio: 2.3 ratio (ref 0.0–5.0)
Cholesterol, Total: 131 mg/dL (ref 100–199)
HDL: 58 mg/dL (ref >39)
LDL Chol Calc (NIH): 58 mg/dL (ref 0–99)
Triglycerides: 74 mg/dL (ref 0–149)
VLDL Cholesterol Cal: 15 mg/dL (ref 5–40)

## 2021-05-25 ENCOUNTER — Ambulatory Visit (INDEPENDENT_AMBULATORY_CARE_PROVIDER_SITE_OTHER): Payer: Medicare Other | Admitting: Internal Medicine

## 2021-05-25 ENCOUNTER — Encounter: Payer: Self-pay | Admitting: Internal Medicine

## 2021-05-25 VITALS — BP 120/68 | HR 66 | Ht 72.0 in | Wt 170.4 lb

## 2021-05-25 DIAGNOSIS — I459 Conduction disorder, unspecified: Secondary | ICD-10-CM | POA: Diagnosis not present

## 2021-05-25 DIAGNOSIS — Z950 Presence of cardiac pacemaker: Secondary | ICD-10-CM | POA: Diagnosis not present

## 2021-05-25 NOTE — Progress Notes (Signed)
? ? ? ? ?HPI ?Fernando Dean returns today for followup. 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 He also has a h/o tobacco abuse and is smoking about 1/2 ppd. He has not had syncope. His fatigue is much improved since his PPM was placed. He has  been sedentary however.  ?Allergies  ?Allergen Reactions  ? Advil [Ibuprofen] Shortness Of Breath and Palpitations  ? Aleve [Naproxen] Shortness Of Breath  ? Nsaids Shortness Of Breath  ? ? ? ?Current Outpatient Medications  ?Medication Sig Dispense Refill  ? albuterol (VENTOLIN HFA) 108 (90 Base) MCG/ACT inhaler Inhale 2 puffs into the lungs every 6 (six) hours as needed for wheezing or shortness of breath. 8 g 2  ? amLODipine (NORVASC) 10 MG tablet Take 1 tablet (10 mg total) by mouth daily. 30 tablet 3  ? aspirin EC 81 MG EC tablet Take 1 tablet (81 mg total) by mouth daily at 6 (six) AM. Swallow whole. 30 tablet 11  ? atorvastatin (LIPITOR) 40 MG tablet Take 1 tablet (40 mg total) by mouth daily. 90 tablet 3  ? carvedilol (COREG) 6.25 MG tablet Take 1 tablet (6.25 mg total) by mouth 2 (two) times daily with a meal. 60 tablet 3  ? Multiple Vitamins-Minerals (MULTIVITAMIN WITH MINERALS) tablet Take 1 tablet by mouth daily. Centrum Silver for Men 50+    ? nicotine (NICODERM CQ - DOSED IN MG/24 HOURS) 21 mg/24hr patch Place 21 mg onto the skin daily.    ? traMADol (ULTRAM) 50 MG tablet Take 1 tablet (50 mg total) by mouth every 6 (six) hours as needed for moderate pain. 20 tablet 0  ? ?No current facility-administered medications for this visit.  ? ? ? ?Past Medical History:  ?Diagnosis Date  ? AAA (abdominal aortic aneurysm) ([place])   ? Alcohol use   ? Aortic regurgitation   ? moderate AR 02/05/21 echo  ? Chronic kidney disease   ? stage 3  ? Colonic mass   ? Heme positive stool   ? Hypertension   ? Presence of permanent cardiac pacemaker 02/08/2021  ? 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 Tuberculosis   ? as a  child, was treated  ? ? ?ROS: ? ? All systems reviewed and negative except as noted in the HPI. ? ? ?Past Surgical History:  ?Procedure Laterality Date  ? ABDOMINAL AORTIC ENDOVASCULAR STENT GRAFT Bilateral 02/26/2021  ? Procedure: ABDOMINAL AORTIC ENDOVASCULAR STENT GRAFT REPAIR;  Surgeon: Marty Heck, MD;  Location: [place];  Service: Vascular;  Laterality: Bilateral;  ? BIOPSY  03/12/2021  ? Procedure: BIOPSY;  Surgeon: [name], [name], MD;  [facility];  Service: Gastroenterology;;  ? COLONOSCOPY WITH PROPOFOL N/A 03/12/2021  ? Procedure: COLONOSCOPY WITH PROPOFOL;  Surgeon: Harvel Quale, MD;  [facility];  Service: Gastroenterology;  Laterality: N/A;  10:55  ? HEMOSTASIS CLIP PLACEMENT  03/12/2021  ? Procedure: HEMOSTASIS CLIP PLACEMENT;  Surgeon: Harvel Quale, MD;  [facility];  Service: Gastroenterology;;  ? HERNIA REPAIR Left 10/25/2006  ? PACEMAKER IMPLANT N/A 02/08/2021  ? Procedure: PACEMAKER IMPLANT;  Surgeon: Deboraha Sprang, MD;  Location: [place] [facility];  Service: Cardiovascular;  Laterality: N/A;  ? POLYPECTOMY  03/12/2021  ? Procedure: POLYPECTOMY;  Surgeon: Harvel Quale, MD;  [facility];  Service: Gastroenterology;;  ? SCLEROTHERAPY  03/12/2021  ? Procedure: SCLEROTHERAPY;  Surgeon: [name], [name], MD;  [facility];  Service: Gastroenterology;;  ? ? ? ?Family History  ?Problem Relation Age of Onset  ? Bladder Cancer Mother   ? Heart disease Father   ? ? ? ?Social History  ? ?Socioeconomic History  ? Marital status: Single  ?  Spouse name: Not on file  ? Number of children: Not on file  ? Years of education: Not on file  ? Highest education level: Not on file  ?Occupational History  ? Not on file  ?Tobacco Use  ? Smoking status: Every Day  ?  Packs/day: 0.75  ?  Years: 50.00  ?  Pack years: 37.50  ?  Types: Cigarettes  ?  Passive exposure: Current  ? Smokeless tobacco: Never   ?Vaping Use  ? Vaping Use: Never used  ?Substance and Sexual Activity  ? Alcohol use: Yes  ?  Alcohol/week: 2.0 - 3.0 standard drinks  ?  Types: 2 - 3 Shots of liquor per week  ?  Comment: daily liquor moderatly  ? Drug use: Not on file  ?  Comment: Last use 02/21/21  ? Sexual activity: Not Currently  ?Other Topics Concern  ? Not on file  ?Social History Narrative  ? Not on file  ? ?Social Determinants of Health  ? ?Financial Resource Strain: Not on file  ?Food Insecurity: Not on file  ?Transportation Needs: Not on file  ?Physical Activity: Not on file  ?Stress: Not on file  ?Social Connections: Not on file  ?Intimate Partner Violence: Not on file  ? ? ? ?BP 120/68   Pulse 66   Ht 6' (1.829 m)   Wt 170 lb 6.4 oz (77.3 kg)   SpO2 99%   BMI 23.11 kg/m?  ? ?Physical Exam: ? ?Well appearing NAD ?HEENT: Unremarkable ?Neck:  No JVD, no thyromegally ?Lymphatics:  No adenopathy ?Back:  No CVA tenderness ?Lungs:  Clear with no wheezes ?HEART:  Regular rate rhythm, no murmurs, no rubs, no clicks ?Abd:  soft, positive bowel sounds, no organomegally, no rebound, no guarding ?Ext:  2 plus pulses, no edema, no cyanosis, no clubbing ?Skin:  No rashes no nodules ?Neuro:  CN II through XII intact, motor grossly intact ? ?EKG - none ? ?DEVICE  ?Normal device function.  See PaceArt for details.  ? ?Assess/Plan:  ?Heart block  - he has CHB with a slow escape today. He is now asymptomatic. ?PPM -his St. Jude DDD PM is working normally. We will recheck in several months. ?HTN - his bp is well controlled. ?Tobacco abuse - I encouraged him to stop smoking. ? ?Carleene Overlie Maude Hettich,MD ?

## 2021-05-25 NOTE — Patient Instructions (Signed)
Medication Instructions:  Your physician recommends that you continue on your current medications as directed. Please refer to the Current Medication list given to you today.  *If you need a refill on your cardiac medications before your next appointment, please call your pharmacy*   Lab Work: NONE   If you have labs (blood work) drawn today and your tests are completely normal, you will receive your results only by: . MyChart Message (if you have MyChart) OR . A paper copy in the mail If you have any lab test that is abnormal or we need to change your treatment, we will call you to review the results.   Testing/Procedures: NONE    Follow-Up: At [facility], you and your health needs are our priority.  As part of our continuing mission to provide you with exceptional heart care, we have created designated Provider Care Teams.  These Care Teams include your primary Cardiologist (physician) and Advanced Practice Providers (APPs -  Physician Assistants and Nurse Practitioners) who all work together to provide you with the care you need, when you need it.  We recommend signing up for the patient portal called "MyChart".  Sign up information is provided on this After Visit Summary.  MyChart is used to connect with patients for Virtual Visits (Telemedicine).  Patients are able to view lab/test results, encounter notes, upcoming appointments, etc.  Non-urgent messages can be sent to your provider as well.   To learn more about what you can do with MyChart, go to [email or web address].    Your next appointment:   1 year(s)  The format for your next appointment:   In Person  Provider:   Gregg Taylor, MD   Other Instructions Thank you for choosing [facility]!    

## 2021-05-26 DIAGNOSIS — I10 Essential (primary) hypertension: Secondary | ICD-10-CM | POA: Diagnosis not present

## 2021-05-27 ENCOUNTER — Inpatient Hospital Stay (HOSPITAL_COMMUNITY): Payer: Medicare Other | Attending: Hematology | Admitting: Hematology

## 2021-05-27 VITALS — BP 147/83 | HR 61 | Temp 98.1°F | Resp 18 | Ht 71.85 in | Wt 167.3 lb

## 2021-05-27 DIAGNOSIS — D631 Anemia in chronic kidney disease: Secondary | ICD-10-CM | POA: Insufficient documentation

## 2021-05-27 DIAGNOSIS — C185 Malignant neoplasm of splenic flexure: Secondary | ICD-10-CM | POA: Insufficient documentation

## 2021-05-27 DIAGNOSIS — I10 Essential (primary) hypertension: Secondary | ICD-10-CM | POA: Diagnosis not present

## 2021-05-27 DIAGNOSIS — C186 Malignant neoplasm of descending colon: Secondary | ICD-10-CM

## 2021-05-27 DIAGNOSIS — N183 Chronic kidney disease, stage 3 unspecified: Secondary | ICD-10-CM | POA: Insufficient documentation

## 2021-05-27 NOTE — Patient Instructions (Signed)
[place] [facility] ?Discharge Instructions ? ?You were seen and examined today by Dr. Delton Coombes. He reviewed your most recent labs and your CEA tumor marker is elevated and your Guardant test showed cancer in blood. Dr. Delton Coombes recommends PET CT scan to see what next steps will be. Please keep follow up appointments as scheduled. ? ? ?Thank you for choosing [place] at [facility] to provide your oncology and hematology care.  To afford each patient quality time with our provider, please arrive at least 15 minutes before your scheduled appointment time.  ? ?If you have a lab appointment with the [facility] please come in thru the Main Entrance and check in at the main information desk. ? ?You need to re-schedule your appointment should you arrive 10 or more minutes late.  We strive to give you quality time with our providers, and arriving late affects you and other patients whose appointments are after yours.  Also, if you no show three or more times for appointments you may be dismissed from the clinic at the providers discretion.     ?Again, thank you for choosing [facility].  Our hope is that these requests will decrease the amount of time that you wait before being seen by our physicians.       ?_____________________________________________________________ ? ?Should you have questions after your visit to [facility], please contact our office at [phone] and follow the prompts.  Our office hours are 8:00 a.m. and 4:30 p.m. Monday - Friday.  Please note that voicemails left after 4:00 p.m. may not be returned until the following business day.  We are closed weekends and major holidays.  You do have access to a nurse 24-7, just call the main number to the clinic [phone] and do not press any options, hold on the line and a nurse will answer the phone.   ? ?For prescription refill requests, have your pharmacy contact  our office and allow 72 hours.   ? ?Due to Covid, you will need to wear a mask upon entering the hospital. If you do not have a mask, a mask will be given to you at the Main Entrance upon arrival. For doctor visits, patients may have 1 support person age 42 or older with them. For treatment visits, patients can not have anyone with them due to social distancing guidelines and our immunocompromised population.  ? ?  ?

## 2021-05-27 NOTE — Progress Notes (Signed)
? ?[facility] ?[address] ?[place], [place] [address] ? ? ?CLINIC:  ?Medical Oncology/Hematology ? ?PCP:  ?Lindell Spar, MD ?[address] / [place] [address] ?[phone] ? ? 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 ?PRIOR THERAPY: Resection of splenic flexure mass on 04/23/2021 by Dr. Marcello Moores ? ?NGS Results:  PT3 pN0.  MSI-stable ? ?CURRENT THERAPY: under work-up ? ?BRIEF ONCOLOGIC HISTORY:  ?Oncology History  ? No history exists.  ? ? ?CANCER STAGING: ? Cancer Staging  ?Cancer of left colon ([place]) ?Staging form: Colon and Rectum, AJCC 8th Edition ?- Clinical stage from 05/11/2021: Stage IIA (cT3, cN0, cM0) - Unsigned ? ? ?INTERVAL HISTORY:  ?[name], a 71 y.o. 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 Fernando Dean was last seen on 05/11/2021.  ? ?Today he reports feeling good. He denies any new pains, and he denies abdominal pain. He denies history of DM.  ? ?REVIEW OF SYSTEMS:  ?Review of Systems  ?Constitutional:  Negative for appetite change and fatigue.  ?Respiratory:  Positive for shortness of breath.   ?Gastrointestinal:  Negative for abdominal pain.  ?Musculoskeletal:  Negative for arthralgias.  ?All other systems reviewed and are negative. ? ?PAST MEDICAL/SURGICAL HISTORY:  ?Past Medical History:  ?Diagnosis Date  ? AAA (abdominal aortic aneurysm) ([place])   ? Alcohol use   ? Aortic regurgitation   ? moderate AR 02/05/21 echo  ? Chronic kidney disease   ? stage 3  ? Colonic mass   ? Heme positive stool   ? Hypertension   ? Presence of permanent cardiac pacemaker 02/08/2021  ? 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 Tuberculosis   ? as a child, was treated  ? ?Past Surgical History:  ?Procedure Laterality Date  ? ABDOMINAL AORTIC ENDOVASCULAR STENT GRAFT Bilateral 02/26/2021  ? Procedure: ABDOMINAL AORTIC ENDOVASCULAR STENT GRAFT REPAIR;   Surgeon: Marty Heck, MD;  Location: [facility];  Service: Vascular;  Laterality: Bilateral;  ? BIOPSY  03/12/2021  ? Procedure: BIOPSY;  Surgeon: [name], [name], MD;  [facility];  Service: Gastroenterology;;  ? COLONOSCOPY WITH PROPOFOL N/A 03/12/2021  ? Procedure: COLONOSCOPY WITH PROPOFOL;  Surgeon: Harvel Quale, MD;  [facility];  Service: Gastroenterology;  Laterality: N/A;  10:55  ? HEMOSTASIS CLIP PLACEMENT  03/12/2021  ? Procedure: HEMOSTASIS CLIP PLACEMENT;  Surgeon: Harvel Quale, MD;  [facility];  Service: Gastroenterology;;  ? HERNIA REPAIR Left 10/25/2006  ? PACEMAKER IMPLANT N/A 02/08/2021  ? Procedure: PACEMAKER IMPLANT;  Surgeon: Deboraha Sprang, MD;  Location: [facility];  Service: Cardiovascular;  Laterality: N/A;  ? POLYPECTOMY  03/12/2021  ? Procedure: POLYPECTOMY;  Surgeon: Harvel Quale, MD;  [facility];  Service: Gastroenterology;;  ? SCLEROTHERAPY  03/12/2021  ? Procedure: SCLEROTHERAPY;  Surgeon: [name], [name], MD;  [facility];  Service: Gastroenterology;;  ? ? ?SOCIAL HISTORY:  ?Social History  ? ?Socioeconomic History  ? Marital status: Single  ?  Spouse name: Not on file  ? Number of children: Not on file  ? Years of education: Not on file  ? Highest education level: Not on file  ?Occupational History  ? Not on file  ?Tobacco Use  ? Smoking status: Every Day  ?  Packs/day: 0.75  ?  Years: 50.00  ?  Pack years: 37.50  ?  Types: Cigarettes  ?  Passive exposure: Current  ? Smokeless tobacco: Never  ?Vaping Use  ? Vaping Use: Never used  ?Substance and Sexual Activity  ? Alcohol use: Yes  ?  Alcohol/week: 2.0 - 3.0 standard drinks  ?  Types: 2 - 3 Shots of liquor per week  ?  Comment: daily liquor moderatly  ? Drug use: Not on file  ?  Comment: Last use 02/21/21  ? Sexual activity: Not Currently  ?Other Topics Concern  ? Not on file  ?Social History Narrative  ? Not  on file  ? ?Social Determinants of Health  ? ?Financial Resource Strain: Not on file  ?Food Insecurity: Not on file  ?Transportation Needs: Not on file  ?Physical Activity: Not on file  ?Stress: Not on file  ?Social Connections: Not on file  ?Intimate Partner Violence: Not on file  ? ? ?FAMILY HISTORY:  ?Family History  ?Problem Relation Age of Onset  ? Bladder Cancer Mother   ? Heart disease Father   ? ? ?CURRENT MEDICATIONS:  ?Current Outpatient Medications  ?Medication Sig Dispense Refill  ? albuterol (VENTOLIN HFA) 108 (90 Base) MCG/ACT inhaler Inhale 2 puffs into the lungs every 6 (six) hours as needed for wheezing or shortness of breath. 8 g 2  ? amLODipine (NORVASC) 10 MG tablet Take 1 tablet (10 mg total) by mouth daily. 30 tablet 3  ? aspirin EC 81 MG EC tablet Take 1 tablet (81 mg total) by mouth daily at 6 (six) AM. Swallow whole. 30 tablet 11  ? atorvastatin (LIPITOR) 40 MG tablet Take 1 tablet (40 mg total) by mouth daily. 90 tablet 3  ? carvedilol (COREG) 6.25 MG tablet Take 1 tablet (6.25 mg total) by mouth 2 (two) times daily with a meal. 60 tablet 3  ? metroNIDAZOLE (FLAGYL) 500 MG tablet metronidazole 500 mg tablet ? TAKE 2 TABLETS BY MOUTH THREE TIMES DAILY. TAKE ACCORDING TO YOUR COLON PREP INSTRUCTIONS    ? Multiple Vitamins-Minerals (MULTIVITAMIN WITH MINERALS) tablet Take 1 tablet by mouth daily. Centrum Silver for Men 50+    ? nicotine (NICODERM CQ - DOSED IN MG/24 HOURS) 21 mg/24hr patch Place 21 mg onto the skin daily.    ? polyethylene glycol powder (GLYCOLAX/MIRALAX) 17 GM/SCOOP powder polyethylene glycol 3350 17 gram/dose oral powder ? TAKE 1 BOTTLE BY MOUTH FOR 1 DOSE ACCORDING TO YOUR PREP INSTRUCTIONS    ? traMADol (ULTRAM) 50 MG tablet Take 1 tablet (50 mg total) by mouth every 6 (six) hours as needed for moderate pain. 20 tablet 0  ? ?No current facility-administered medications for this visit.  ? ? ?ALLERGIES:  ?Allergies  ?Allergen Reactions  ? Advil [Ibuprofen] Shortness Of  Breath and Palpitations  ? Aleve [Naproxen] Shortness Of Breath  ? Nsaids Shortness Of Breath  ? ? ?PHYSICAL EXAM:  ?Performance status (ECOG): 1 - Symptomatic but completely ambulatory ? ?Vitals:  ? 05/27/21 1509  ?BP: (!) 147/83  ?Pulse: 61  ?Resp: 18  ?Temp: 98.1 ?F (36.7 ?C)  ?SpO2: 98%  ? ?Wt Readings from Last 3 Encounters:  ?05/27/21 167 lb 4.8 oz (75.9 kg)  ?05/25/21 170 lb 6.4 oz (77.3 kg)  ?05/18/21 166 lb 12.8 oz (75.7 kg)  ? ?Physical Exam ?Vitals reviewed.  ?Constitutional:   ?   Appearance: Normal appearance.  ?Cardiovascular:  ?   Rate and Rhythm: Normal rate and regular rhythm.  ?   Pulses: Normal  pulses.  ?   Heart sounds: Normal heart sounds.  ?Pulmonary:  ?   Effort: Pulmonary effort is normal.  ?   Breath sounds: Normal breath sounds.  ?Neurological:  ?   General: No focal deficit present.  ?   Mental Status: He is alert and oriented to person, place, and time.  ?Psychiatric:     ?   Mood and Affect: Mood normal.     ?   Behavior: Behavior normal.  ?  ? ?LABORATORY DATA:  ?I have reviewed the labs as listed.  ? ?  Latest Ref Rng & Units 04/26/2021  ?  4:00 AM 04/25/2021  ?  4:21 AM 04/24/2021  ?  4:23 AM  ?CBC  ?WBC 4.0 - 10.5 K/uL 9.7   13.4   20.0    ?Hemoglobin 13.0 - 17.0 g/dL 10.0   9.4   9.6    ?Hematocrit 39.0 - 52.0 % 32.4   30.4   30.8    ?Platelets 150 - 400 K/uL 232   209   190    ? ? ?  Latest Ref Rng & Units 05/23/2021  ? 12:00 AM 04/26/2021  ?  4:00 AM 04/25/2021  ?  4:21 AM  ?CMP  ?Glucose 70 - 99 mg/dL  88   87    ?BUN 8 - 23 mg/dL  17   20    ?Creatinine 0.61 - 1.24 mg/dL  1.59   1.73    ?Sodium 135 - 145 mmol/L  136   134    ?Potassium 3.5 - 5.1 mmol/L  4.1   4.1    ?Chloride 98 - 111 mmol/L  105   105    ?CO2 22 - 32 mmol/L  22   24    ?Calcium 8.9 - 10.3 mg/dL  9.1   8.8    ?Total Protein 6.0 - 8.5 g/dL 7.5      ?Total Bilirubin 0.0 - 1.2 mg/dL 0.4      ?Alkaline Phos 44 - 121 IU/L 176      ?AST 0 - 40 IU/L 16      ?ALT 0 - 44 IU/L 10      ? ? ?DIAGNOSTIC IMAGING:  ?I have independently  reviewed the scans and discussed with the patient. ?CUP PACEART REMOTE DEVICE CHECK ? ?Result Date: 05/20/2021 ?Scheduled remote reviewed. Normal device function.  Next remote 91 days. Kathy Breach, RN, C

## 2021-05-31 DIAGNOSIS — Z950 Presence of cardiac pacemaker: Secondary | ICD-10-CM | POA: Diagnosis not present

## 2021-05-31 DIAGNOSIS — R748 Abnormal levels of other serum enzymes: Secondary | ICD-10-CM | POA: Diagnosis not present

## 2021-05-31 DIAGNOSIS — N1831 Chronic kidney disease, stage 3a: Secondary | ICD-10-CM | POA: Diagnosis not present

## 2021-05-31 DIAGNOSIS — I10 Essential (primary) hypertension: Secondary | ICD-10-CM | POA: Diagnosis not present

## 2021-05-31 DIAGNOSIS — F17200 Nicotine dependence, unspecified, uncomplicated: Secondary | ICD-10-CM | POA: Diagnosis not present

## 2021-05-31 DIAGNOSIS — C185 Malignant neoplasm of splenic flexure: Secondary | ICD-10-CM | POA: Diagnosis not present

## 2021-05-31 DIAGNOSIS — R809 Proteinuria, unspecified: Secondary | ICD-10-CM | POA: Diagnosis not present

## 2021-05-31 DIAGNOSIS — I714 Abdominal aortic aneurysm, without rupture, unspecified: Secondary | ICD-10-CM | POA: Diagnosis not present

## 2021-05-31 DIAGNOSIS — E785 Hyperlipidemia, unspecified: Secondary | ICD-10-CM | POA: Diagnosis not present

## 2021-06-01 ENCOUNTER — Inpatient Hospital Stay (HOSPITAL_COMMUNITY): Payer: Medicare Other

## 2021-06-01 ENCOUNTER — Encounter (HOSPITAL_COMMUNITY): Payer: Self-pay

## 2021-06-01 VITALS — BP 128/68 | HR 60 | Temp 97.9°F | Resp 18

## 2021-06-01 DIAGNOSIS — C185 Malignant neoplasm of splenic flexure: Secondary | ICD-10-CM | POA: Diagnosis not present

## 2021-06-01 DIAGNOSIS — I10 Essential (primary) hypertension: Secondary | ICD-10-CM | POA: Diagnosis not present

## 2021-06-01 DIAGNOSIS — N183 Chronic kidney disease, stage 3 unspecified: Secondary | ICD-10-CM | POA: Diagnosis not present

## 2021-06-01 DIAGNOSIS — D631 Anemia in chronic kidney disease: Secondary | ICD-10-CM | POA: Diagnosis not present

## 2021-06-01 DIAGNOSIS — D509 Iron deficiency anemia, unspecified: Secondary | ICD-10-CM

## 2021-06-01 MED ORDER — ACETAMINOPHEN 325 MG PO TABS
650.0000 mg | ORAL_TABLET | Freq: Once | ORAL | Status: AC
  Administered 2021-06-01: 650 mg via ORAL
  Filled 2021-06-01: qty 2

## 2021-06-01 MED ORDER — SODIUM CHLORIDE 0.9 % IV SOLN: Freq: Once | INTRAVENOUS | Status: AC

## 2021-06-01 MED ORDER — SODIUM CHLORIDE 0.9 % IV SOLN
510.0000 mg | Freq: Once | INTRAVENOUS | Status: AC
  Administered 2021-06-01: 510 mg via INTRAVENOUS
  Filled 2021-06-01: qty 17

## 2021-06-01 MED ORDER — LORATADINE 10 MG PO TABS
10.0000 mg | ORAL_TABLET | Freq: Once | ORAL | Status: AC
  Administered 2021-06-01: 10 mg via ORAL
  Filled 2021-06-01: qty 1

## 2021-06-01 NOTE — Progress Notes (Signed)
Reviewed iron side effects with information given for review at bedside.  All questions asked and answered. No s/s of distress noted.  ? ?Patient tolerated iron infusion with no complaints voiced.  Peripheral IV site clean and dry with good blood return noted before and after infusion.  Band aid applied.  VSS with discharge and left in satisfactory condition with no s/s of distress noted.   ?

## 2021-06-01 NOTE — Progress Notes (Signed)
Remote pacemaker transmission.   

## 2021-06-01 NOTE — Patient Instructions (Signed)

## 2021-06-07 ENCOUNTER — Other Ambulatory Visit: Payer: Self-pay | Admitting: Genetic Counselor

## 2021-06-07 ENCOUNTER — Inpatient Hospital Stay: Payer: Medicare Other | Attending: Genetic Counselor | Admitting: Genetic Counselor

## 2021-06-07 ENCOUNTER — Inpatient Hospital Stay: Payer: Medicare Other

## 2021-06-07 ENCOUNTER — Other Ambulatory Visit: Payer: Self-pay

## 2021-06-07 DIAGNOSIS — C186 Malignant neoplasm of descending colon: Secondary | ICD-10-CM

## 2021-06-07 DIAGNOSIS — Z800 Family history of malignant neoplasm of digestive organs: Secondary | ICD-10-CM

## 2021-06-07 NOTE — Addendum Note (Signed)
Addended by: Ignacia Bayley on: 06/07/2021 02:00 PM ? ? Modules accepted: Orders ? ?

## 2021-06-08 ENCOUNTER — Encounter: Payer: Self-pay | Admitting: Genetic Counselor

## 2021-06-08 ENCOUNTER — Inpatient Hospital Stay (HOSPITAL_COMMUNITY): Payer: Medicare Other

## 2021-06-08 ENCOUNTER — Encounter (HOSPITAL_COMMUNITY): Payer: Self-pay | Admitting: Hematology

## 2021-06-08 VITALS — BP 127/72 | HR 58 | Temp 97.9°F | Resp 18

## 2021-06-08 DIAGNOSIS — I10 Essential (primary) hypertension: Secondary | ICD-10-CM | POA: Diagnosis not present

## 2021-06-08 DIAGNOSIS — D509 Iron deficiency anemia, unspecified: Secondary | ICD-10-CM

## 2021-06-08 DIAGNOSIS — D631 Anemia in chronic kidney disease: Secondary | ICD-10-CM | POA: Diagnosis not present

## 2021-06-08 DIAGNOSIS — C185 Malignant neoplasm of splenic flexure: Secondary | ICD-10-CM | POA: Diagnosis not present

## 2021-06-08 DIAGNOSIS — N183 Chronic kidney disease, stage 3 unspecified: Secondary | ICD-10-CM | POA: Diagnosis not present

## 2021-06-08 DIAGNOSIS — Z800 Family history of malignant neoplasm of digestive organs: Secondary | ICD-10-CM

## 2021-06-08 HISTORY — DX: Family history of malignant neoplasm of digestive organs: Z80.0

## 2021-06-08 MED ORDER — LORATADINE 10 MG PO TABS
10.0000 mg | ORAL_TABLET | Freq: Once | ORAL | Status: AC
  Administered 2021-06-08: 10 mg via ORAL
  Filled 2021-06-08: qty 1

## 2021-06-08 MED ORDER — SODIUM CHLORIDE 0.9 % IV SOLN
510.0000 mg | Freq: Once | INTRAVENOUS | Status: AC
  Administered 2021-06-08: 510 mg via INTRAVENOUS
  Filled 2021-06-08: qty 510

## 2021-06-08 MED ORDER — SODIUM CHLORIDE 0.9 % IV SOLN: Freq: Once | INTRAVENOUS | Status: AC

## 2021-06-08 MED ORDER — ACETAMINOPHEN 325 MG PO TABS
650.0000 mg | ORAL_TABLET | Freq: Once | ORAL | Status: AC
  Administered 2021-06-08: 650 mg via ORAL
  Filled 2021-06-08: qty 2

## 2021-06-08 NOTE — Progress Notes (Signed)
Patient tolerated iron infusion with no complaints voiced.  Peripheral IV site clean and dry with good blood return noted before and after infusion.  Band aid applied.  VSS with discharge and left in satisfactory condition with no s/s of distress noted.   

## 2021-06-08 NOTE — Progress Notes (Signed)
REFERRING PROVIDER: ?Fernando Jack, MD ?[address] ?[place] [address] ? ? ?PRIMARY PROVIDER:  ?Fernando Spar, MD ? ?PRIMARY REASON FOR VISIT:  ?1. Cancer of left colon ([place])   ?2. Family history of colon cancer   ? ? ? ?HISTORY OF PRESENT ILLNESS:   ?[name], a 71 y.o. male, was seen for a [facility] cancer genetics consultation at the request of Dr. Delton Coombes due to a personal and family history of colon cancer.  Fernando Dean presents to clinic today to discuss the possibility of a hereditary predisposition to cancer, to discuss genetic testing, and to further clarify his future cancer risks, as well as potential cancer risks for family members.  ? ?In 2023, at the age of 5, Fernando Dean was diagnosed with adenocarcinoma of the left colon. Mismatch repair (MMR) proteins were intact, and MSI was stable.  ? ? ? ?Past Medical History:  ?Diagnosis Date  ? AAA (abdominal aortic aneurysm) ([place])   ? Alcohol use   ? Aortic regurgitation   ? moderate AR 02/05/21 echo  ? Chronic kidney disease   ? stage 3  ? Colonic mass   ? Family history of colon cancer 06/08/2021  ? Heme positive stool   ? Hypertension   ? Presence of permanent cardiac pacemaker 02/08/2021  ? 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 Tuberculosis   ? as a child, was treated  ? ? ?Past Surgical History:  ?Procedure Laterality Date  ? ABDOMINAL AORTIC ENDOVASCULAR STENT GRAFT Bilateral 02/26/2021  ? Procedure: ABDOMINAL AORTIC ENDOVASCULAR STENT GRAFT REPAIR;  Surgeon: Marty Heck, MD;  Location: [facility];  Service: Vascular;  Laterality: Bilateral;  ? BIOPSY  03/12/2021  ? Procedure: BIOPSY;  Surgeon: [name], [name], MD;  [facility];  Service: Gastroenterology;;  ? COLONOSCOPY WITH PROPOFOL N/A 03/12/2021  ? Procedure: COLONOSCOPY WITH PROPOFOL;  Surgeon: Harvel Quale, MD;  [facility];  Service: Gastroenterology;  Laterality: N/A;  10:55  ?  HEMOSTASIS CLIP PLACEMENT  03/12/2021  ? Procedure: HEMOSTASIS CLIP PLACEMENT;  Surgeon: Harvel Quale, MD;  [facility];  Service: Gastroenterology;;  ? HERNIA REPAIR Left 10/25/2006  ? PACEMAKER IMPLANT N/A 02/08/2021  ? Procedure: PACEMAKER IMPLANT;  Surgeon: Deboraha Sprang, MD;  Location: [facility];  Service: Cardiovascular;  Laterality: N/A;  ? POLYPECTOMY  03/12/2021  ? Procedure: POLYPECTOMY;  Surgeon: Harvel Quale, MD;  [facility];  Service: Gastroenterology;;  ? SCLEROTHERAPY  03/12/2021  ? Procedure: SCLEROTHERAPY;  Surgeon: [name], [name], MD;  [facility];  Service: Gastroenterology;;  ? ? ?FAMILY HISTORY:  ?We obtained a detailed, 4-generation family history.  Significant diagnoses are listed below: ?Family History  ?Problem Relation Age of Onset  ? Colon polyps Sister   ?     less than 10 lifetime polyps  ? Colon cancer Maternal Grandmother   ?     dx 90s  ? ? ?Fernando Dean is unaware of previous family history of genetic testing for hereditary cancer risks. There is no reported Ashkenazi Jewish ancestry. There is no known consanguinity. ? ?GENETIC COUNSELING ASSESSMENT: Fernando Dean is a 71 y.o. male with a personal and family history of cancer which is not highly suggestive of a hereditary cancer syndrome. We, therefore, discussed and recommended the following at today's visit.  ? ?DISCUSSION: We discussed that, in general, most cancer is not  inherited in families, but instead is sporadic or familial. Sporadic cancers occur by chance and typically happen at older ages (>50 years) as this type of cancer is caused by genetic changes acquired during an individual?s lifetime. Some families have more cancers than would be expected by chance; however, the ages or types of cancer are not consistent with a known genetic mutation or known genetic mutations have been ruled out. This type of familial cancer is thought to be due to a  combination of multiple genetic, environmental, hormonal, and lifestyle factors. While this combination of factors likely increases the risk of cancer, the exact source of this risk is not currently identifiable or testable.  We also reviewed his normal MMR IHC and MSI tumor testing.  ? ?We discussed that approximately 5-10% of cancer is hereditary, meaning that it is due to a mutation in a single gene that is passed down from generation to generation in a family. Most hereditary cases of hereditary colon cancer are associated with Lynch syndrome. There are other genes that can be associated an increased risk for colon cancer. We discussed that testing can be beneficial for several reasons, including knowing about other cancer risks, identifying potential screening and risk-reduction options that may be appropriate, and to understand if other family members could be at risk for cancer and allow them to undergo genetic testing. ? ?We discussed with Fernando Dean that the personal and family history does not meet insurance or NCCN criteria for genetic testing and, therefore, is not highly consistent with a familial hereditary cancer syndrome.  We feel he is at low risk to harbor a gene mutation associated with such a condition. Thus, we did not recommend any genetic testing, at this time, and recommended Fernando Dean continue to follow the cancer screening guidelines given by his oncology and primary healthcare provider.  Fernando Dean was made away that genetic testing was available through a self-pay price if he wished to proceed with genetic testing despite being low risk for a hereditary cancer mutation.  ? ?PLAN: Fernando Dean did not meet NCCN criteria and did not wish to pursue genetic testing at today's visit. We understand this decision and remain available to coordinate genetic testing at any time in the future. We, therefore, recommend Fernando Dean continue to follow the cancer screening guidelines given by his oncology  and primary healthcare providers ? ?Lastly, we encouraged Fernando Dean to remain in contact with cancer genetics annually so that we can continuously update the family history and inform him of any changes in cancer genetics and testing that may be of benefit for this family.  ? ?Fernando Dean's questions were answered to his satisfaction today. Our contact information was provided should additional questions or concerns arise. Thank you for the referral and allowing [place] to share in the care of your patient.  ? ?Keyara Ent M. Joette Catching, [place], [place] ?Genetic Counselor ?[email or web address] ?(P) [phone] ? ?The patient was seen for a total of 20 minutes in face-to-face genetic counseling.  The patient was accompanied by his sister. Drs. Lindi Adie and/or Burr Medico were available to discuss this case as needed.  ? ? ?_______________________________________________________________________ ?For Office Staff:  ?Number of people involved in session: 2 ?Was an Intern/ student involved with case: no ? ?

## 2021-06-08 NOTE — Patient Instructions (Signed)
[facility]  Discharge Instructions: Thank you for choosing [facility] to provide your oncology and hematology care.  If you have a lab appointment with the Cancer Center, please come in thru the Main Entrance and check in at the main information desk.  Wear comfortable clothing and clothing appropriate for easy access to any Portacath or PICC line.   We strive to give you quality time with your provider. You may need to reschedule your appointment if you arrive late (15 or more minutes).  Arriving late affects you and other patients whose appointments are after yours.  Also, if you miss three or more appointments without notifying the office, you may be dismissed from the clinic at the provider's discretion.      For prescription refill requests, have your pharmacy contact our office and allow 72 hours for refills to be completed.        To help prevent nausea and vomiting after your treatment, we encourage you to take your nausea medication as directed.  BELOW ARE SYMPTOMS THAT SHOULD BE REPORTED IMMEDIATELY: *FEVER GREATER THAN 100.4 F (38 C) OR HIGHER *CHILLS OR SWEATING *NAUSEA AND VOMITING THAT IS NOT CONTROLLED WITH YOUR NAUSEA MEDICATION *UNUSUAL SHORTNESS OF BREATH *UNUSUAL BRUISING OR BLEEDING *URINARY PROBLEMS (pain or burning when urinating, or frequent urination) *BOWEL PROBLEMS (unusual diarrhea, constipation, pain near the anus) TENDERNESS IN MOUTH AND THROAT WITH OR WITHOUT PRESENCE OF ULCERS (sore throat, sores in mouth, or a toothache) UNUSUAL RASH, SWELLING OR PAIN  UNUSUAL VAGINAL DISCHARGE OR ITCHING   Items with * indicate a potential emergency and should be followed up as soon as possible or go to the Emergency Department if any problems should occur.  Please show the CHEMOTHERAPY ALERT CARD or IMMUNOTHERAPY ALERT CARD at check-in to the Emergency Department and triage nurse.  Should you have questions after your visit or need to cancel  or reschedule your appointment, please contact [facility] [phone]  and follow the prompts.  Office hours are 8:00 a.m. to 4:30 p.m. Monday - Friday. Please note that voicemails left after 4:00 p.m. may not be returned until the following business day.  We are closed weekends and major holidays. You have access to a nurse at all times for urgent questions. Please call the main number to the clinic [phone] and follow the prompts.  For any non-urgent questions, you may also contact your provider using MyChart. We now offer e-Visits for anyone 18 and older to request care online for non-urgent symptoms. For details visit [email or web address].   Also download the MyChart app! Go to the app store, search "MyChart", open the app, select [facility], and log in with your MyChart username and password.  Due to Covid, a mask is required upon entering the hospital/clinic. If you do not have a mask, one will be given to you upon arrival. For doctor visits, patients may have 1 support person aged 18 or older with them. For treatment visits, patients cannot have anyone with them due to current Covid guidelines and our immunocompromised population.  

## 2021-06-10 ENCOUNTER — Encounter (HOSPITAL_COMMUNITY)
Admission: RE | Admit: 2021-06-10 | Discharge: 2021-06-10 | Disposition: A | Discharge status: Home / Self Care | Payer: Medicare Other | Source: Ambulatory Visit | Attending: Hematology | Admitting: Hematology

## 2021-06-10 DIAGNOSIS — J984 Other disorders of lung: Secondary | ICD-10-CM | POA: Diagnosis not present

## 2021-06-10 DIAGNOSIS — C189 Malignant neoplasm of colon, unspecified: Secondary | ICD-10-CM | POA: Diagnosis not present

## 2021-06-10 DIAGNOSIS — C186 Malignant neoplasm of descending colon: Secondary | ICD-10-CM | POA: Diagnosis not present

## 2021-06-10 DIAGNOSIS — J432 Centrilobular emphysema: Secondary | ICD-10-CM | POA: Diagnosis not present

## 2021-06-10 MED ORDER — FLUDEOXYGLUCOSE F - 18 (FDG) INJECTION
8.5400 | Freq: Once | INTRAVENOUS | Status: AC | PRN
  Administered 2021-06-10: 8.54 via INTRAVENOUS

## 2021-06-14 DIAGNOSIS — I10 Essential (primary) hypertension: Secondary | ICD-10-CM | POA: Diagnosis not present

## 2021-06-17 ENCOUNTER — Encounter (HOSPITAL_COMMUNITY): Payer: Self-pay | Admitting: Hematology

## 2021-06-17 ENCOUNTER — Inpatient Hospital Stay (HOSPITAL_COMMUNITY): Payer: Medicare Other | Admitting: Hematology

## 2021-06-17 VITALS — BP 155/92 | HR 65 | Temp 97.4°F | Resp 18 | Ht 72.0 in | Wt 171.1 lb

## 2021-06-17 DIAGNOSIS — C185 Malignant neoplasm of splenic flexure: Secondary | ICD-10-CM | POA: Diagnosis not present

## 2021-06-17 DIAGNOSIS — D509 Iron deficiency anemia, unspecified: Secondary | ICD-10-CM

## 2021-06-17 DIAGNOSIS — I10 Essential (primary) hypertension: Secondary | ICD-10-CM | POA: Diagnosis not present

## 2021-06-17 DIAGNOSIS — C186 Malignant neoplasm of descending colon: Secondary | ICD-10-CM | POA: Diagnosis not present

## 2021-06-17 DIAGNOSIS — D631 Anemia in chronic kidney disease: Secondary | ICD-10-CM | POA: Diagnosis not present

## 2021-06-17 DIAGNOSIS — N183 Chronic kidney disease, stage 3 unspecified: Secondary | ICD-10-CM | POA: Diagnosis not present

## 2021-06-17 NOTE — Patient Instructions (Addendum)
[place] at [facility] ?Discharge Instructions ? ? ?You were seen and examined today by Dr. Delton Coombes. ? ?He reviewed the results of your PET scan.  There is a spot in your colon lighting up indicating cancer. We will refer you back to Dr. Jenetta Downer for repeat colonoscopy. If the biopsy is positive, you need to see Dr. Marcello Moores again and have this area removed.  ? ?Return as scheduled in 3 months.  ? ? ?Thank you for choosing [place] at [facility] to provide your oncology and hematology care.  To afford each patient quality time with our provider, please arrive at least 15 minutes before your scheduled appointment time.  ? ?If you have a lab appointment with the [facility] please come in thru the Main Entrance and check in at the main information desk. ? ?You need to re-schedule your appointment should you arrive 10 or more minutes late.  We strive to give you quality time with our providers, and arriving late affects you and other patients whose appointments are after yours.  Also, if you no show three or more times for appointments you may be dismissed from the clinic at the providers discretion.     ?Again, thank you for choosing [facility]  Our hope is that these requests will decrease the amount of time that you wait before being seen by our physicians.       ?_____________________________________________________________ ? ?Should you have questions after your visit to [facility], please contact our office at [phone] and follow the prompts.  Our office hours are 8:00 a.m. and 4:30 p.m. Monday - Friday.  Please note that voicemails left after 4:00 p.m. may not be returned until the following business day.  We are closed weekends and major holidays.  You do have access to a nurse 24-7, just call the main number to the clinic [phone] and do not press any options, hold on the line and a nurse will answer the phone.    ? ?For prescription refill requests, have your pharmacy contact our office and allow 72 hours.   ? ?Due to Covid, you will need to wear a mask upon entering the hospital. If you do not have a mask, a mask will be given to you at the Main Entrance upon arrival. For doctor visits, patients may have 1 support person age 75 or older with them. For treatment visits, patients can not have anyone with them due to social distancing guidelines and our immunocompromised population.  ? ?   ?

## 2021-06-17 NOTE — Progress Notes (Signed)
? ?[facility] ?[address] ?[place], [place] [address] ? ? ?CLINIC:  ?Medical Oncology/Hematology ? ?PCP:  ?Lindell Spar, MD ?[address] / [place] [address] ?[phone] ? ? 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 ?PRIOR THERAPY: Resection of splenic flexure mass on 04/23/2021 by Dr. Marcello Moores ? ?NGS Results: PT3 pN0.  MSI-stable ? ?CURRENT THERAPY: under work-up ? ?BRIEF ONCOLOGIC HISTORY:  ?Oncology History  ? No history exists.  ? ? ?CANCER STAGING: ?Cancer Staging  ?Cancer of left colon ([place]) ?Staging form: Colon and Rectum, AJCC 8th Edition ?- Clinical stage from 05/11/2021: Stage IIA (cT3, cN0, cM0) - Unsigned ? ? ?INTERVAL HISTORY:  ?[name], a 71 y.o. 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 Fernando Dean was last seen on 05/27/2021.  ? ?Today he reports feeling good. He reports improved energy following the iron infusion. He denies hematochezia and black stool. He denies new panis, and his energy is good.  ? ?REVIEW OF SYSTEMS:  ?Review of Systems  ?Constitutional:  Negative for appetite change and fatigue.  ?Respiratory:  Positive for shortness of breath.   ?Gastrointestinal:  Negative for blood in stool.  ?Neurological:  Positive for numbness.  ?All other systems reviewed and are negative. ? ?PAST MEDICAL/SURGICAL HISTORY:  ?Past Medical History:  ?Diagnosis Date  ? AAA (abdominal aortic aneurysm) ([place])   ? Alcohol use   ? Aortic regurgitation   ? moderate AR 02/05/21 echo  ? Chronic kidney disease   ? stage 3  ? Colonic mass   ? Family history of colon cancer 06/08/2021  ? Heme positive stool   ? Hypertension   ? Presence of permanent cardiac pacemaker 02/08/2021  ? 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 Tuberculosis   ? as a child, was treated  ? ?Past Surgical History:  ?Procedure Laterality Date  ? ABDOMINAL AORTIC  ENDOVASCULAR STENT GRAFT Bilateral 02/26/2021  ? Procedure: ABDOMINAL AORTIC ENDOVASCULAR STENT GRAFT REPAIR;  Surgeon: Marty Heck, MD;  Location: [facility];  Service: Vascular;  Laterality: Bilateral;  ? BIOPSY  03/12/2021  ? Procedure: BIOPSY;  Surgeon: [name], [name], MD;  [facility];  Service: Gastroenterology;;  ? COLONOSCOPY WITH PROPOFOL N/A 03/12/2021  ? Procedure: COLONOSCOPY WITH PROPOFOL;  Surgeon: Harvel Quale, MD;  [facility];  Service: Gastroenterology;  Laterality: N/A;  10:55  ? HEMOSTASIS CLIP PLACEMENT  03/12/2021  ? Procedure: HEMOSTASIS CLIP PLACEMENT;  Surgeon: Harvel Quale, MD;  [facility];  Service: Gastroenterology;;  ? HERNIA REPAIR Left 10/25/2006  ? PACEMAKER IMPLANT N/A 02/08/2021  ? Procedure: PACEMAKER IMPLANT;  Surgeon: Deboraha Sprang, MD;  Location: [facility];  Service: Cardiovascular;  Laterality: N/A;  ? POLYPECTOMY  03/12/2021  ? Procedure: POLYPECTOMY;  Surgeon: Harvel Quale, MD;  [facility];  Service: Gastroenterology;;  ? SCLEROTHERAPY  03/12/2021  ? Procedure: SCLEROTHERAPY;  Surgeon: [name], [name], MD;  [facility];  Service: Gastroenterology;;  ? ? ?SOCIAL HISTORY:  ?Social History  ? ?Socioeconomic History  ? Marital status: Single  ?  Spouse name: Not on file  ? Number of children: Not on file  ? Years of education: Not on file  ? Highest education level: Not on file  ?Occupational History  ? Not on file  ?Tobacco  Use  ? Smoking status: Every Day  ?  Packs/day: 0.75  ?  Years: 50.00  ?  Pack years: 37.50  ?  Types: Cigarettes  ?  Passive exposure: Current  ? Smokeless tobacco: Never  ?Vaping Use  ? Vaping Use: Never used  ?Substance and Sexual Activity  ? Alcohol use: Yes  ?  Alcohol/week: 2.0 - 3.0 standard drinks  ?  Types: 2 - 3 Shots of liquor per week  ?  Comment: daily liquor moderatly  ? Drug use: Not on file  ?  Comment: Last use  02/21/21  ? Sexual activity: Not Currently  ?Other Topics Concern  ? Not on file  ?Social History Narrative  ? Not on file  ? ?Social Determinants of Health  ? ?Financial Resource Strain: Not on file  ?Food Insecurity: Not on file  ?Transportation Needs: Not on file  ?Physical Activity: Not on file  ?Stress: Not on file  ?Social Connections: Not on file  ?Intimate Partner Violence: Not on file  ? ? ?FAMILY HISTORY:  ?Family History  ?Problem Relation Age of Onset  ? Heart disease Father   ? Colon polyps Sister   ?     less than 10 lifetime polyps  ? Colon cancer Maternal Grandmother   ?     dx 90s  ? ? ?CURRENT MEDICATIONS:  ?Current Outpatient Medications  ?Medication Sig Dispense Refill  ? albuterol (VENTOLIN HFA) 108 (90 Base) MCG/ACT inhaler Inhale 2 puffs into the lungs every 6 (six) hours as needed for wheezing or shortness of breath. 8 g 2  ? amLODipine (NORVASC) 10 MG tablet Take 1 tablet (10 mg total) by mouth daily. 30 tablet 3  ? aspirin EC 81 MG EC tablet Take 1 tablet (81 mg total) by mouth daily at 6 (six) AM. Swallow whole. 30 tablet 11  ? atorvastatin (LIPITOR) 40 MG tablet Take 1 tablet (40 mg total) by mouth daily. 90 tablet 3  ? carvedilol (COREG) 6.25 MG tablet Take 1 tablet (6.25 mg total) by mouth 2 (two) times daily with a meal. 60 tablet 3  ? metroNIDAZOLE (FLAGYL) 500 MG tablet metronidazole 500 mg tablet ? TAKE 2 TABLETS BY MOUTH THREE TIMES DAILY. TAKE ACCORDING TO YOUR COLON PREP INSTRUCTIONS    ? Multiple Vitamins-Minerals (MULTIVITAMIN WITH MINERALS) tablet Take 1 tablet by mouth daily. Centrum Silver for Men 50+    ? nicotine (NICODERM CQ - DOSED IN MG/24 HOURS) 21 mg/24hr patch Place 21 mg onto the skin daily.    ? polyethylene glycol powder (GLYCOLAX/MIRALAX) 17 GM/SCOOP powder polyethylene glycol 3350 17 gram/dose oral powder ? TAKE 1 BOTTLE BY MOUTH FOR 1 DOSE ACCORDING TO YOUR PREP INSTRUCTIONS    ? traMADol (ULTRAM) 50 MG tablet Take 1 tablet (50 mg total) by mouth every 6 (six)  hours as needed for moderate pain. 20 tablet 0  ? ?No current facility-administered medications for this visit.  ? ? ?ALLERGIES:  ?Allergies  ?Allergen Reactions  ? Advil [Ibuprofen] Shortness Of Breath and Palpitations  ? Aleve [Naproxen] Shortness Of Breath  ? Nsaids Shortness Of Breath  ? ? ?PHYSICAL EXAM:  ?Performance status (ECOG): 1 - Symptomatic but completely ambulatory ? ?There were no vitals filed for this visit. ?Wt Readings from Last 3 Encounters:  ?05/27/21 167 lb 4.8 oz (75.9 kg)  ?05/25/21 170 lb 6.4 oz (77.3 kg)  ?05/18/21 166 lb 12.8 oz (75.7 kg)  ? ?Physical Exam ?Vitals reviewed.  ?Constitutional:   ?  Appearance: Normal appearance.  ?Cardiovascular:  ?   Rate and Rhythm: Normal rate and regular rhythm.  ?   Pulses: Normal pulses.  ?   Heart sounds: Normal heart sounds.  ?Pulmonary:  ?   Effort: Pulmonary effort is normal.  ?   Breath sounds: Normal breath sounds.  ?Neurological:  ?   General: No focal deficit present.  ?   Mental Status: He is alert and oriented to person, place, and time.  ?Psychiatric:     ?   Mood and Affect: Mood normal.     ?   Behavior: Behavior normal.  ?  ? ?LABORATORY DATA:  ?I have reviewed the labs as listed.  ? ?  Latest Ref Rng & Units 04/26/2021  ?  4:00 AM 04/25/2021  ?  4:21 AM 04/24/2021  ?  4:23 AM  ?CBC  ?WBC 4.0 - 10.5 K/uL 9.7   13.4   20.0    ?Hemoglobin 13.0 - 17.0 g/dL 10.0   9.4   9.6    ?Hematocrit 39.0 - 52.0 % 32.4   30.4   30.8    ?Platelets 150 - 400 K/uL 232   209   190    ? ? ?  Latest Ref Rng & Units 05/23/2021  ? 12:00 AM 04/26/2021  ?  4:00 AM 04/25/2021  ?  4:21 AM  ?CMP  ?Glucose 70 - 99 mg/dL  88   87    ?BUN 8 - 23 mg/dL  17   20    ?Creatinine 0.61 - 1.24 mg/dL  1.59   1.73    ?Sodium 135 - 145 mmol/L  136   134    ?Potassium 3.5 - 5.1 mmol/L  4.1   4.1    ?Chloride 98 - 111 mmol/L  105   105    ?CO2 22 - 32 mmol/L  22   24    ?Calcium 8.9 - 10.3 mg/dL  9.1   8.8    ?Total Protein 6.0 - 8.5 g/dL 7.5      ?Total Bilirubin 0.0 - 1.2 mg/dL 0.4       ?Alkaline Phos 44 - 121 IU/L 176      ?AST 0 - 40 IU/L 16      ?ALT 0 - 44 IU/L 10      ? ? ?DIAGNOSTIC IMAGING:  ?I have independently reviewed the scans and discussed with the patient. ?NM PET Image Initial (PI) Skull

## 2021-06-21 ENCOUNTER — Encounter (INDEPENDENT_AMBULATORY_CARE_PROVIDER_SITE_OTHER): Payer: Self-pay | Admitting: Gastroenterology

## 2021-06-21 ENCOUNTER — Ambulatory Visit (INDEPENDENT_AMBULATORY_CARE_PROVIDER_SITE_OTHER): Payer: Medicare Other | Admitting: Gastroenterology

## 2021-06-21 ENCOUNTER — Other Ambulatory Visit (INDEPENDENT_AMBULATORY_CARE_PROVIDER_SITE_OTHER): Payer: Self-pay

## 2021-06-21 ENCOUNTER — Telehealth (INDEPENDENT_AMBULATORY_CARE_PROVIDER_SITE_OTHER): Payer: Self-pay

## 2021-06-21 ENCOUNTER — Encounter (INDEPENDENT_AMBULATORY_CARE_PROVIDER_SITE_OTHER): Payer: Self-pay

## 2021-06-21 VITALS — BP 149/82 | HR 67 | Temp 98.0°F | Ht 72.0 in | Wt 170.6 lb

## 2021-06-21 DIAGNOSIS — D509 Iron deficiency anemia, unspecified: Secondary | ICD-10-CM | POA: Diagnosis not present

## 2021-06-21 DIAGNOSIS — C186 Malignant neoplasm of descending colon: Secondary | ICD-10-CM

## 2021-06-21 DIAGNOSIS — R948 Abnormal results of function studies of other organs and systems: Secondary | ICD-10-CM | POA: Insufficient documentation

## 2021-06-21 MED ORDER — PEG 3350-KCL-NA BICARB-NACL 420 G PO SOLR: 4000.0000 mL | ORAL | 0 refills | Status: DC

## 2021-06-21 NOTE — Patient Instructions (Signed)
Schedule colonoscopy

## 2021-06-21 NOTE — H&P (View-Only) (Signed)
[name], M.D. ?Gastroenterology & Hepatology ?[facility] ?[address] ?[place], [place] [address] ? ?Primary Care Physician: ?Lindell Spar, MD ?[address] ?[place] [address] ? ?I will communicate my assessment and recommendations to the referring MD via EMR. ? ?Problems: ?Stage IIA (cT3, cN0, cM0) splenic flexure cancer status post resection ?Iron def anemia ? ?History of Present Illness: ?TANNAR BROKER is a 71 y.o. male with PMH Stage IIA CRC s/p splenic flexure resection, medical history of chronic tobacco and alcohol abuse, who presents for follow up of colon cancer and evaluation of abnormal PET scan. ? ?The patient was last seen on 04/01/2021. At that time, the patient was advised to follow with surgery for partial colectomy. ? ?Patient underwent robot resection of the splenic flexure on 04/23/2021 at [facility] (Dr. Marcello Moores) uneventfully. He had a 3 day hospital stay after the surgery. ? ?Path: ?A. SPLENIC FLEXURE, RESECTION:  ?- Invasive moderately differentiated adenocarcinoma, 5.4 cm  ?- Carcinoma invades into subserosa  ?- Resection margins are negative for carcinoma  ?- Thirty-eight benign lymph nodes (0/38)  ?- No microsatelite instability ?Stage: PT3 pN0. ? ?He has been following with Dr. Delton Coombes in the oncology office.  He underwent Guardant reveal on 05/21/21 which showed positive DNA. CEA in 05/11/21 was elevated 11.4. PET CT on 06/10/21 showed a 2 cm hypermetabolic soft tissue density in the transverse colon.  No evidence of local or distant metastatic disease. ? ?Had two iron infusions in early April as his iron stores in 04/2021 showed iron 34 , ferritin 25, TIBC 505, 7% sat. Last Hb on 04/26/21 was 10.0. ? ?The patient reports feeling well and states he has some mild discomfort in the epigastric area.  Denies having any other complaints.  He denies having any nausea, vomiting, fever, chills, hematochezia, melena,  hematemesis, abdominal distention, abdominal pain, diarrhea, jaundice, pruritus or weight loss. ? ?Last Colonoscopy: ?02/2021 ?- Likely malignant partially obstructing tumor at 40 cm proximal to the anus. Biopsied- path above. Injected. ?- One 20 mm polyp in the sigmoid colon, removed with a hot snare. Resected and retrieved. Clips were placed. ?- Non-bleeding internal hemorrhoids. ?Notably, colonoscopy could not be completed as the mass was obstructing the lumen and did not allow passage of the ultraslim scope. ? ?Past Medical History: ?Past Medical History:  ?Diagnosis Date  ? AAA (abdominal aortic aneurysm) ([place])   ? Alcohol use   ? Aortic regurgitation   ? moderate AR 02/05/21 echo  ? Chronic kidney disease   ? stage 3  ? Colonic mass   ? Family history of colon cancer 06/08/2021  ? Heme positive stool   ? Hypertension   ? Presence of permanent cardiac pacemaker 02/08/2021  ? 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 Tuberculosis   ? as a child, was treated  ? ? ?Past Surgical History ? ?Family History: ?Family History  ?Problem Relation Age of Onset  ? Heart disease Father   ? Colon polyps Sister   ?     less than 10 lifetime polyps  ? Colon cancer Maternal Grandmother   ?     dx 90s  ? ? ?Social History: ?Social History  ? ?Tobacco Use  ?Smoking Status Every Day  ? Packs/day: 0.75  ? Years: 50.00  ? Pack years: 37.50  ? Types: Cigarettes  ? Passive exposure: Current  ?Smokeless Tobacco Never  ? ?Social  History  ? ?Substance and Sexual Activity  ?Alcohol Use Yes  ? Alcohol/week: 2.0 - 3.0 standard drinks  ? Types: 2 - 3 Shots of liquor per week  ? Comment: daily liquor moderatly  ? ?Social History  ? ?Substance and Sexual Activity  ?Drug Use Not on file  ? Comment: Last use 02/21/21  ? ? ?Allergies: ?Allergies  ?Allergen Reactions  ? Advil [Ibuprofen] Shortness Of Breath and Palpitations  ? Aleve [Naproxen] Shortness Of Breath  ? Nsaids Shortness Of Breath   ? ? ?Medications: ?Current Outpatient Medications  ?Medication Sig Dispense Refill  ? albuterol (VENTOLIN HFA) 108 (90 Base) MCG/ACT inhaler Inhale 2 puffs into the lungs every 6 (six) hours as needed for wheezing or shortness of breath. 8 g 2  ? amLODipine (NORVASC) 10 MG tablet Take 1 tablet (10 mg total) by mouth daily. 30 tablet 3  ? aspirin EC 81 MG EC tablet Take 1 tablet (81 mg total) by mouth daily at 6 (six) AM. Swallow whole. 30 tablet 11  ? atorvastatin (LIPITOR) 40 MG tablet Take 1 tablet (40 mg total) by mouth daily. 90 tablet 3  ? carvedilol (COREG) 6.25 MG tablet Take 1 tablet (6.25 mg total) by mouth 2 (two) times daily with a meal. 60 tablet 3  ? lisinopril (ZESTRIL) 5 MG tablet Take 1 tablet by mouth daily.    ? Multiple Vitamins-Minerals (MULTIVITAMIN WITH MINERALS) tablet Take 1 tablet by mouth daily. Centrum Silver for Men 50+    ? nicotine (NICODERM CQ - DOSED IN MG/24 HOURS) 21 mg/24hr patch Place 21 mg onto the skin daily.    ? simvastatin (ZOCOR) 20 MG tablet Take 20 mg by mouth at bedtime.    ? ?No current facility-administered medications for this visit.  ? ? ?Review of Systems: ?GENERAL: negative for malaise, night sweats ?HEENT: No changes in hearing or vision, no nose bleeds or other nasal problems. ?NECK: Negative for lumps, goiter, pain and significant neck swelling ?RESPIRATORY: Negative for cough, wheezing ?CARDIOVASCULAR: Negative for chest pain, leg swelling, palpitations, orthopnea ?GI: SEE HPI ?MUSCULOSKELETAL: Negative for joint pain or swelling, back pain, and muscle pain. ?SKIN: Negative for lesions, rash ?PSYCH: Negative for sleep disturbance, mood disorder and recent psychosocial stressors. ?HEMATOLOGY Negative for prolonged bleeding, bruising easily, and swollen nodes. ?ENDOCRINE: Negative for cold or heat intolerance, polyuria, polydipsia and goiter. ?NEURO: negative for tremor, gait imbalance, syncope and seizures. ?The remainder of the review of systems is  noncontributory. ? ? ?Physical Exam: ?BP (!) 149/82 (BP Location: Left Arm, Patient Position: Sitting, Cuff Size: Small)   Pulse 67   Temp 98 ?F (36.7 ?C) (Oral)   Ht 6' (1.829 m)   Wt 170 lb 9.6 oz (77.4 kg)   BMI 23.14 kg/m?  ?GENERAL: The patient is AO x3, in no acute distress. ?HEENT: Head is normocephalic and atraumatic. EOMI are intact. Mouth is well hydrated and without lesions. ?NECK: Supple. No masses ?LUNGS: Clear to auscultation. No presence of rhonchi/wheezing/rales. Adequate chest expansion ?HEART: RRR, normal s1 and s2. ?ABDOMEN: Soft, nontender, no guarding, no peritoneal signs, and nondistended. BS +. No masses. ?EXTREMITIES: Without any cyanosis, clubbing, rash, lesions or edema. ?NEUROLOGIC: AOx3, no focal motor deficit. ?SKIN: no jaundice, no rashes ? ?Imaging/Labs: ?as above ? ?I personally reviewed and interpreted the available labs, imaging and endoscopic files. ? ?Impression and Plan: ?SAMRAT HAYWARD is a 71 y.o. male with PMH Stage IIA CRC s/p splenic flexure resection, medical history of chronic tobacco and  alcohol abuse, who presents for follow up of colon cancer and evaluation of abnormal PET scan.  The patient underwent successful partial colectomy for management of colon cancer.  He did not develop any postoperative complications.  However, the patient recently underwent a PET scan that showed increased uptake in the transverse colon (no mass was previously identified in CT angio of the abdomen and pelvis performed in December 2022 and February 2023).  I explained to the patient that his main finding was concerning for synchronous colorectal cancer malignancy, especially as his CEA and stool testing have remained elevated after undergoing partial colectomy, as well as the presence of persistent iron deficiency anemia.  He has never had complete colonoscopy in the past.  Due to this, we will expedite a colonoscopy this week due to diagnosis and evaluation of other concomitant  polyps/malignancy. ? ?- Schedule colonoscopy ? ?All questions were answered.     ? ?Harvel Quale, MD ?Gastroenterology and Hepatology ?[facility] ? ?

## 2021-06-21 NOTE — Telephone Encounter (Signed)
Fernando Dean [name] [name], CMA  ?

## 2021-06-21 NOTE — Progress Notes (Addendum)
[name], M.D. ?Gastroenterology & Hepatology ?[facility] ?[address] ?[place], [place] [address] ? ?Primary Care Physician: ?Lindell Spar, MD ?[address] ?[place] [address] ? ?I will communicate my assessment and recommendations to the referring MD via EMR. ? ?Problems: ?Stage IIA (cT3, cN0, cM0) splenic flexure cancer status post resection ?Iron def anemia ? ?History of Present Illness: ?Fernando Dean is a 71 y.o. male with PMH Stage IIA CRC s/p splenic flexure resection, medical history of chronic tobacco and alcohol abuse, who presents for follow up of colon cancer and evaluation of abnormal PET scan. ? ?The patient was last seen on 04/01/2021. At that time, the patient was advised to follow with surgery for partial colectomy. ? ?Patient underwent robot resection of the splenic flexure on 04/23/2021 at [facility] (Dr. Marcello Moores) uneventfully. He had a 3 day hospital stay after the surgery. ? ?Path: ?A. SPLENIC FLEXURE, RESECTION:  ?- Invasive moderately differentiated adenocarcinoma, 5.4 cm  ?- Carcinoma invades into subserosa  ?- Resection margins are negative for carcinoma  ?- Thirty-eight benign lymph nodes (0/38)  ?- No microsatelite instability ?Stage: PT3 pN0. ? ?He has been following with Dr. Delton Coombes in the oncology office.  He underwent Guardant reveal on 05/21/21 which showed positive DNA. CEA in 05/11/21 was elevated 11.4. PET CT on 06/10/21 showed a 2 cm hypermetabolic soft tissue density in the transverse colon.  No evidence of local or distant metastatic disease. ? ?Had two iron infusions in early April as his iron stores in 04/2021 showed iron 34 , ferritin 25, TIBC 505, 7% sat. Last Hb on 04/26/21 was 10.0. ? ?The patient reports feeling well and states he has some mild discomfort in the epigastric area.  Denies having any other complaints.  He denies having any nausea, vomiting, fever, chills, hematochezia, melena,  hematemesis, abdominal distention, abdominal pain, diarrhea, jaundice, pruritus or weight loss. ? ?Last Colonoscopy: ?02/2021 ?- Likely malignant partially obstructing tumor at 40 cm proximal to the anus. Biopsied- path above. Injected. ?- One 20 mm polyp in the sigmoid colon, removed with a hot snare. Resected and retrieved. Clips were placed. ?- Non-bleeding internal hemorrhoids. ?Notably, colonoscopy could not be completed as the mass was obstructing the lumen and did not allow passage of the ultraslim scope. ? ?Past Medical History: ?Past Medical History:  ?Diagnosis Date  ? AAA (abdominal aortic aneurysm) ([place])   ? Alcohol use   ? Aortic regurgitation   ? moderate AR 02/05/21 echo  ? Chronic kidney disease   ? stage 3  ? Colonic mass   ? Family history of colon cancer 06/08/2021  ? Heme positive stool   ? Hypertension   ? Presence of permanent cardiac pacemaker 02/08/2021  ? 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 Tuberculosis   ? as a child, was treated  ? ? ?Past Surgical History ? ?Family History: ?Family History  ?Problem Relation Age of Onset  ? Heart disease Father   ? Colon polyps Sister   ?     less than 10 lifetime polyps  ? Colon cancer Maternal Grandmother   ?     dx 90s  ? ? ?Social History: ?Social History  ? ?Tobacco Use  ?Smoking Status Every Day  ? Packs/day: 0.75  ? Years: 50.00  ? Pack years: 37.50  ? Types: Cigarettes  ? Passive exposure: Current  ?Smokeless Tobacco Never  ? ?Social  History  ? ?Substance and Sexual Activity  ?Alcohol Use Yes  ? Alcohol/week: 2.0 - 3.0 standard drinks  ? Types: 2 - 3 Shots of liquor per week  ? Comment: daily liquor moderatly  ? ?Social History  ? ?Substance and Sexual Activity  ?Drug Use Not on file  ? Comment: Last use 02/21/21  ? ? ?Allergies: ?Allergies  ?Allergen Reactions  ? Advil [Ibuprofen] Shortness Of Breath and Palpitations  ? Aleve [Naproxen] Shortness Of Breath  ? Nsaids Shortness Of Breath   ? ? ?Medications: ?Current Outpatient Medications  ?Medication Sig Dispense Refill  ? albuterol (VENTOLIN HFA) 108 (90 Base) MCG/ACT inhaler Inhale 2 puffs into the lungs every 6 (six) hours as needed for wheezing or shortness of breath. 8 g 2  ? amLODipine (NORVASC) 10 MG tablet Take 1 tablet (10 mg total) by mouth daily. 30 tablet 3  ? aspirin EC 81 MG EC tablet Take 1 tablet (81 mg total) by mouth daily at 6 (six) AM. Swallow whole. 30 tablet 11  ? atorvastatin (LIPITOR) 40 MG tablet Take 1 tablet (40 mg total) by mouth daily. 90 tablet 3  ? carvedilol (COREG) 6.25 MG tablet Take 1 tablet (6.25 mg total) by mouth 2 (two) times daily with a meal. 60 tablet 3  ? lisinopril (ZESTRIL) 5 MG tablet Take 1 tablet by mouth daily.    ? Multiple Vitamins-Minerals (MULTIVITAMIN WITH MINERALS) tablet Take 1 tablet by mouth daily. Centrum Silver for Men 50+    ? nicotine (NICODERM CQ - DOSED IN MG/24 HOURS) 21 mg/24hr patch Place 21 mg onto the skin daily.    ? simvastatin (ZOCOR) 20 MG tablet Take 20 mg by mouth at bedtime.    ? ?No current facility-administered medications for this visit.  ? ? ?Review of Systems: ?GENERAL: negative for malaise, night sweats ?HEENT: No changes in hearing or vision, no nose bleeds or other nasal problems. ?NECK: Negative for lumps, goiter, pain and significant neck swelling ?RESPIRATORY: Negative for cough, wheezing ?CARDIOVASCULAR: Negative for chest pain, leg swelling, palpitations, orthopnea ?GI: SEE HPI ?MUSCULOSKELETAL: Negative for joint pain or swelling, back pain, and muscle pain. ?SKIN: Negative for lesions, rash ?PSYCH: Negative for sleep disturbance, mood disorder and recent psychosocial stressors. ?HEMATOLOGY Negative for prolonged bleeding, bruising easily, and swollen nodes. ?ENDOCRINE: Negative for cold or heat intolerance, polyuria, polydipsia and goiter. ?NEURO: negative for tremor, gait imbalance, syncope and seizures. ?The remainder of the review of systems is  noncontributory. ? ? ?Physical Exam: ?BP (!) 149/82 (BP Location: Left Arm, Patient Position: Sitting, Cuff Size: Small)   Pulse 67   Temp 98 ?F (36.7 ?C) (Oral)   Ht 6' (1.829 m)   Wt 170 lb 9.6 oz (77.4 kg)   BMI 23.14 kg/m?  ?GENERAL: The patient is AO x3, in no acute distress. ?HEENT: Head is normocephalic and atraumatic. EOMI are intact. Mouth is well hydrated and without lesions. ?NECK: Supple. No masses ?LUNGS: Clear to auscultation. No presence of rhonchi/wheezing/rales. Adequate chest expansion ?HEART: RRR, normal s1 and s2. ?ABDOMEN: Soft, nontender, no guarding, no peritoneal signs, and nondistended. BS +. No masses. ?EXTREMITIES: Without any cyanosis, clubbing, rash, lesions or edema. ?NEUROLOGIC: AOx3, no focal motor deficit. ?SKIN: no jaundice, no rashes ? ?Imaging/Labs: ?as above ? ?I personally reviewed and interpreted the available labs, imaging and endoscopic files. ? ?Impression and Plan: ?Fernando Dean is a 71 y.o. male with PMH Stage IIA CRC s/p splenic flexure resection, medical history of chronic tobacco and  alcohol abuse, who presents for follow up of colon cancer and evaluation of abnormal PET scan.  The patient underwent successful partial colectomy for management of colon cancer.  He did not develop any postoperative complications.  However, the patient recently underwent a PET scan that showed increased uptake in the transverse colon (no mass was previously identified in CT angio of the abdomen and pelvis performed in December 2022 and February 2023).  I explained to the patient that his main finding was concerning for synchronous colorectal cancer malignancy, especially as his CEA and stool testing have remained elevated after undergoing partial colectomy, as well as the presence of persistent iron deficiency anemia.  He has never had complete colonoscopy in the past.  Due to this, we will expedite a colonoscopy this week due to diagnosis and evaluation of other concomitant  polyps/malignancy. ? ?- Schedule colonoscopy ? ?All questions were answered.     ? ?Harvel Quale, MD ?Gastroenterology and Hepatology ?[facility] ? ?

## 2021-06-23 ENCOUNTER — Encounter (HOSPITAL_COMMUNITY)
Admission: RE | Admit: 2021-06-23 | Discharge: 2021-06-23 | Disposition: A | Discharge status: Home / Self Care | Payer: Medicare Other | Source: Ambulatory Visit | Attending: Ophthalmology | Admitting: Ophthalmology

## 2021-06-23 ENCOUNTER — Encounter (HOSPITAL_COMMUNITY): Payer: Self-pay

## 2021-06-23 ENCOUNTER — Other Ambulatory Visit: Payer: Self-pay

## 2021-06-25 ENCOUNTER — Other Ambulatory Visit: Payer: Self-pay

## 2021-06-25 ENCOUNTER — Encounter (HOSPITAL_COMMUNITY)
Admission: RE | Disposition: A | Discharge status: Home / Self Care | Payer: Self-pay | Source: Home / Self Care | Attending: Gastroenterology

## 2021-06-25 ENCOUNTER — Ambulatory Visit (HOSPITAL_BASED_OUTPATIENT_CLINIC_OR_DEPARTMENT_OTHER): Payer: Medicare Other | Admitting: Anesthesiology

## 2021-06-25 ENCOUNTER — Ambulatory Visit (HOSPITAL_COMMUNITY): Payer: Medicare Other | Admitting: Anesthesiology

## 2021-06-25 ENCOUNTER — Ambulatory Visit (HOSPITAL_COMMUNITY)
Admission: RE | Admit: 2021-06-25 | Discharge: 2021-06-25 | Disposition: A | Discharge status: Home / Self Care | Payer: Medicare Other | Attending: Gastroenterology | Admitting: Gastroenterology

## 2021-06-25 ENCOUNTER — Encounter (HOSPITAL_COMMUNITY): Payer: Self-pay | Admitting: Gastroenterology

## 2021-06-25 DIAGNOSIS — R933 Abnormal findings on diagnostic imaging of other parts of digestive tract: Secondary | ICD-10-CM | POA: Diagnosis not present

## 2021-06-25 DIAGNOSIS — D124 Benign neoplasm of descending colon: Secondary | ICD-10-CM | POA: Diagnosis not present

## 2021-06-25 DIAGNOSIS — C189 Malignant neoplasm of colon, unspecified: Secondary | ICD-10-CM | POA: Insufficient documentation

## 2021-06-25 DIAGNOSIS — Z800 Family history of malignant neoplasm of digestive organs: Secondary | ICD-10-CM | POA: Diagnosis not present

## 2021-06-25 DIAGNOSIS — I10 Essential (primary) hypertension: Secondary | ICD-10-CM

## 2021-06-25 DIAGNOSIS — Z9049 Acquired absence of other specified parts of digestive tract: Secondary | ICD-10-CM | POA: Diagnosis not present

## 2021-06-25 DIAGNOSIS — D509 Iron deficiency anemia, unspecified: Secondary | ICD-10-CM | POA: Diagnosis not present

## 2021-06-25 DIAGNOSIS — D122 Benign neoplasm of ascending colon: Secondary | ICD-10-CM | POA: Insufficient documentation

## 2021-06-25 DIAGNOSIS — K635 Polyp of colon: Secondary | ICD-10-CM

## 2021-06-25 DIAGNOSIS — J449 Chronic obstructive pulmonary disease, unspecified: Secondary | ICD-10-CM | POA: Insufficient documentation

## 2021-06-25 DIAGNOSIS — D123 Benign neoplasm of transverse colon: Secondary | ICD-10-CM | POA: Diagnosis not present

## 2021-06-25 DIAGNOSIS — I739 Peripheral vascular disease, unspecified: Secondary | ICD-10-CM | POA: Insufficient documentation

## 2021-06-25 DIAGNOSIS — I129 Hypertensive chronic kidney disease with stage 1 through stage 4 chronic kidney disease, or unspecified chronic kidney disease: Secondary | ICD-10-CM | POA: Insufficient documentation

## 2021-06-25 DIAGNOSIS — F1010 Alcohol abuse, uncomplicated: Secondary | ICD-10-CM | POA: Insufficient documentation

## 2021-06-25 DIAGNOSIS — F17210 Nicotine dependence, cigarettes, uncomplicated: Secondary | ICD-10-CM | POA: Insufficient documentation

## 2021-06-25 DIAGNOSIS — Z980 Intestinal bypass and anastomosis status: Secondary | ICD-10-CM | POA: Diagnosis not present

## 2021-06-25 DIAGNOSIS — C184 Malignant neoplasm of transverse colon: Secondary | ICD-10-CM

## 2021-06-25 DIAGNOSIS — I351 Nonrheumatic aortic (valve) insufficiency: Secondary | ICD-10-CM | POA: Diagnosis not present

## 2021-06-25 DIAGNOSIS — Z950 Presence of cardiac pacemaker: Secondary | ICD-10-CM | POA: Insufficient documentation

## 2021-06-25 DIAGNOSIS — N189 Chronic kidney disease, unspecified: Secondary | ICD-10-CM | POA: Insufficient documentation

## 2021-06-25 DIAGNOSIS — Z85038 Personal history of other malignant neoplasm of large intestine: Secondary | ICD-10-CM | POA: Diagnosis present

## 2021-06-25 HISTORY — PX: HEMOSTASIS CLIP PLACEMENT: SHX6857

## 2021-06-25 HISTORY — PX: COLONOSCOPY WITH PROPOFOL: SHX5780

## 2021-06-25 HISTORY — PX: POLYPECTOMY: SHX5525

## 2021-06-25 HISTORY — PX: BIOPSY: SHX5522

## 2021-06-25 LAB — HM COLONOSCOPY

## 2021-06-25 SURGERY — COLONOSCOPY WITH PROPOFOL
Anesthesia: General

## 2021-06-25 MED ORDER — SPOT INK MARKER SYRINGE KIT
PACK | SUBMUCOSAL | Status: DC | PRN
  Administered 2021-06-25: 3 mL via SUBMUCOSAL

## 2021-06-25 MED ORDER — PROPOFOL 500 MG/50ML IV EMUL
INTRAVENOUS | Status: DC | PRN
  Administered 2021-06-25: 125 ug/kg/min via INTRAVENOUS

## 2021-06-25 MED ORDER — SPOT INK MARKER SYRINGE KIT
PACK | SUBMUCOSAL | Status: AC
  Filled 2021-06-25: qty 5

## 2021-06-25 MED ORDER — LACTATED RINGERS IV SOLN: INTRAVENOUS | Status: DC

## 2021-06-25 MED ORDER — PROPOFOL 10 MG/ML IV BOLUS
INTRAVENOUS | Status: DC | PRN
  Administered 2021-06-25: 100 mg via INTRAVENOUS

## 2021-06-25 NOTE — Discharge Instructions (Signed)
-   Discharge patient to home (ambulatory).  ?- Resume previous diet.  ?- Await pathology results. ?- Will discuss results with Dr. Delton Coombes and Dr. Marcello Moores once pathology is back. ?

## 2021-06-25 NOTE — Anesthesia Preprocedure Evaluation (Signed)
Anesthesia Evaluation  ?Patient identified by MRN, date of birth, ID band ?Patient awake ? ? ? ?Reviewed: ?Allergy & Precautions, NPO status , Patient's Chart, lab work & pertinent test results ? ?Airway ?Mallampati: II ? ?TM Distance: >3 FB ?Neck ROM: Full ? ? ? Dental ? ?(+) Edentulous Upper, Edentulous Lower ?  ?Pulmonary ?COPD,  COPD inhaler, Current Smoker and Patient abstained from smoking.,  ?  ?Pulmonary exam normal ?breath sounds clear to auscultation ? ? ? ? ? ? Cardiovascular ?hypertension, Pt. on medications ?+ Peripheral Vascular Disease (AAA Repair - 02/26/2021)  ?+ dysrhythmias + pacemaker (Complete heart block) + Valvular Problems/Murmurs AI  ?Rhythm:Regular Rate:Normal ? ? ?  ?Neuro/Psych ?negative neurological ROS ? negative psych ROS  ? GI/Hepatic ?Bowel prep,(+)  ?  ? substance abuse ? alcohol use, Colon resection  ?  ?Endo/Other  ?negative endocrine ROS ? Renal/GU ?Renal InsufficiencyRenal disease  ?negative genitourinary ?  ?Musculoskeletal ?negative musculoskeletal ROS ?(+)  ? Abdominal ?  ?Peds ?negative pediatric ROS ?(+)  Hematology ? ?(+) Blood dyscrasia, anemia ,   ?Anesthesia Other Findings ? ? Reproductive/Obstetrics ?negative OB ROS ? ?  ? ? ? ? ? ? ? ? ? ? ? ? ? ?  ?  ? ? ? ? ? ? ? ? ?Anesthesia Physical ?Anesthesia Plan ? ?ASA: 3 ? ?Anesthesia Plan: General  ? ?Post-op Pain Management: Minimal or no pain anticipated  ? ?Induction: Intravenous ? ?PONV Risk Score and Plan: TIVA ? ?Airway Management Planned: Nasal Cannula and Natural Airway ? ?Additional Equipment:  ? ?Intra-op Plan:  ? ?Post-operative Plan:  ? ?Informed Consent: I have reviewed the patients History and Physical, chart, labs and discussed the procedure including the risks, benefits and alternatives for the proposed anesthesia with the patient or authorized representative who has indicated his/her understanding and acceptance.  ? ? ? ? ? ?Plan Discussed with: CRNA and Surgeon ? ?Anesthesia  Plan Comments:   ? ? ? ? ? ? ?Anesthesia Quick Evaluation ? ?

## 2021-06-25 NOTE — Op Note (Addendum)
[facility] ?Patient Name: Fernando Dean ?Procedure Date: 06/25/2021 2:19 PM ?MRN: [id number] ?Date of Birth: [birth year] ?Attending MD: [name] ,  ?CSN: [id number] ?Age: 71 ?Admit Type: Outpatient ?Procedure:                Colonoscopy ?Indications:              Colon cancer, Abnormal CT of the GI tract ?Providers:                [name], Gwynneth Albright RN, RN,  ?                          Randa Spike, Technician ?Referring MD:             Derek Jack, MD ?Medicines:                Monitored Anesthesia Care ?Complications:            No immediate complications. ?Estimated Blood Loss:     Estimated blood loss: none. ?Procedure:                Pre-Anesthesia Assessment: ?                          - Prior to the procedure, a History and Physical  ?                          was performed, and patient medications, allergies  ?                          and sensitivities were reviewed. The patient's  ?                          tolerance of previous anesthesia was reviewed. ?                          - Prior to the procedure, a History and Physical  ?                          was performed, and patient medications, allergies  ?                          and sensitivities were reviewed. The patient's  ?                          tolerance of previous anesthesia was reviewed. ?                          - The risks and benefits of the procedure and the  ?                          sedation options and risks were discussed with the  ?                          patient. All questions were answered and informed  ?  consent was obtained. ?                          - ASA Grade Assessment: II - A patient with mild  ?                          systemic disease. ?                          After obtaining informed consent, the colonoscope  ?                          was passed under direct vision. Throughout the  ?                          procedure, the patient's blood pressure,  pulse, and  ?                          oxygen saturations were monitored continuously. The  ?                          PCF-HQ190L ([id number]) scope was introduced through  ?                          the anus and advanced to the the cecum, identified  ?                          by appendiceal orifice and ileocecal valve. The  ?                          colonoscopy was performed without difficulty. The  ?                          patient tolerated the procedure well. The quality  ?                          of the bowel preparation was good. ?Scope In: 2:34:12 PM ?Scope Out: 3:12:27 PM ?Scope Withdrawal Time: 0 hours 35 minutes 30 seconds  ?Total Procedure Duration: 0 hours 38 minutes 15 seconds  ?Findings: ?     The perianal and digital rectal examinations were normal. ?     A 6 mm polyp was found in the ascending colon. The polyp was sessile.  ?     The polyp was removed with a cold snare. Resection and retrieval were  ?     complete. ?     An ulcerated non-obstructing medium-sized mass was found in the proximal  ?     transverse colon. The mass was partially circumferential (involving  ?     one-third of the lumen circumference). The mass measured three cm in  ?     length. No bleeding was present. This was biopsied with a cold  ?     large-capacity forceps for histology. Area distal to the mass was  ?     successfully injected with 3 mL [place] ink for tattooing. ?     A 2 mm polyp was found in the transverse colon.  The polyp was sessile.  ?     The polyp was removed with a jumbo cold forceps. Resection and retrieval  ?     were complete. ?     A 30 mm polyp was found in the descending colon. The polyp was  ?     pedunculated. The polyp was removed with a hot snare. Resection and  ?     retrieval were complete. To prevent bleeding post-intervention, three  ?     hemostatic clips were successfully placed. There was no bleeding at the  ?     end of the procedure. ?     There was evidence of a prior end-to-end  colo-colonic anastomosis at 30  ?     cm proximal to the anus. This was patent and was characterized by  ?     healthy appearing mucosa. The anastomosis was traversed. ?     The retroflexed view of the distal rectum and anal verge was normal and  ?     showed no anal or rectal abnormalities. ?Impression:               - One 6 mm polyp in the ascending colon, removed  ?                          with a cold snare. Resected and retrieved. ?                          - Likely malignant tumor in the proximal transverse  ?                          colon. Biopsied. Injected. ?                          - One 2 mm polyp in the transverse colon, removed  ?                          with a jumbo cold forceps. Resected and retrieved. ?                          - One 30 mm polyp in the descending colon, removed  ?                          with a hot snare. Resected and retrieved. Clips  ?                          were placed. ?                          - Patent end-to-end colo-colonic anastomosis,  ?                          characterized by healthy appearing mucosa. ?                          - The distal rectum and anal verge are normal on  ?  retroflexion view. ?Moderate Sedation: ?     Per Anesthesia Care ?Recommendation:           - Discharge patient to home (ambulatory). ?                          - Resume previous diet. ?                          - Await pathology results. ?                          - Will discuss results with Dr. Delton Coombes and Dr.  ?                          Thomas once pathology is back. ?Procedure Code(s):        --- Professional --- ?                          [phone], Colonoscopy, flexible; with removal of  ?                          tumor(s), polyp(s), or other lesion(s) by snare  ?                          technique ?                          45381, Colonoscopy, flexible; with directed  ?                          submucosal injection(s), any substance ?                           45380, 59, Colonoscopy, flexible; with biopsy,  ?                          single or multiple ?Diagnosis Code(s):        --- Professional --- ?                          K63.5, Polyp of colon ?                          D49.0, Neoplasm of unspecified behavior of  ?                          digestive system ?                          Z98.0, Intestinal bypass and anastomosis status ?                          C18.9, Malignant neoplasm of colon, unspecified ?                          R93.3, Abnormal findings on diagnostic imaging of  ?  other parts of digestive tract ?CPT copyright 2019 American Medical Association. All rights reserved. ?The codes documented in this report are preliminary and upon coder review may  ?be revised to meet current compliance requirements. ?[name], MD ?[name],  ?06/25/2021 3:24:50 PM ?This report has been signed electronically. ?Number of Addenda: 0 ?

## 2021-06-25 NOTE — Interval H&P Note (Signed)
History and Physical Interval Note: ? ?06/25/2021 ?1:20 PM ? ?Fernando Dean  has presented today for surgery, with the diagnosis of Abnormal PET scan Colon Cancer.  The various methods of treatment have been discussed with the patient and family. After consideration of risks, benefits and other options for treatment, the patient has consented to  Procedure(s) with comments: ?COLONOSCOPY WITH PROPOFOL (N/A) - 235 ASA 2 as a surgical intervention.  The patient's history has been reviewed, patient examined, no change in status, stable for surgery.  I have reviewed the patient's chart and labs.  Questions were answered to the patient's satisfaction.   ? ? ?Fernando Dean ? ? ?

## 2021-06-25 NOTE — Transfer of Care (Signed)
Immediate Anesthesia Transfer of Care Note ? ?Patient: Fernando Dean ? ?Procedure(s) Performed: COLONOSCOPY WITH PROPOFOL ?BIOPSY ?POLYPECTOMY ?HEMOSTASIS CLIP PLACEMENT ? ?Patient Location: Short Stay ? ?Anesthesia Type:General ? ?Level of Consciousness: awake ? ?Airway & Oxygen Therapy: Patient Spontanous Breathing ? ?Post-op Assessment: Report given to RN and Post -op Vital signs reviewed and stable ? ?Post vital signs: Reviewed and stable ? ?Last Vitals:  ?Vitals Value Taken Time  ?BP    ?Temp    ?Pulse    ?Resp    ?SpO2    ? ? ?Last Pain:  ?Vitals:  ? 06/25/21 1434  ?PainSc: 0-No pain  ?   ? ?  ? ?Complications: No notable events documented. ?

## 2021-06-28 ENCOUNTER — Encounter (INDEPENDENT_AMBULATORY_CARE_PROVIDER_SITE_OTHER): Payer: Self-pay | Admitting: *Deleted

## 2021-06-28 NOTE — Anesthesia Postprocedure Evaluation (Signed)
Anesthesia Post Note ? ?Patient: Fernando Dean ? ?Procedure(s) Performed: COLONOSCOPY WITH PROPOFOL ?BIOPSY ?POLYPECTOMY ?HEMOSTASIS CLIP PLACEMENT ? ?Patient location during evaluation: PACU ?Anesthesia Type: General ?Level of consciousness: awake and alert and oriented ?Pain management: pain level controlled ?Vital Signs Assessment: post-procedure vital signs reviewed and stable ?Respiratory status: spontaneous breathing, nonlabored ventilation and respiratory function stable ?Cardiovascular status: blood pressure returned to baseline and stable ?Postop Assessment: no apparent nausea or vomiting ?Anesthetic complications: no ? ? ?No notable events documented. ? ? ?Last Vitals:  ?Vitals:  ? 06/25/21 1323 06/25/21 1518  ?BP: (!) 177/99 (!) 108/57  ?Pulse: 68 60  ?Resp: 20 (!) 25  ?Temp: 36.8 ?C (!) 36.4 ?C  ?SpO2: 100% 99%  ?  ?Last Pain:  ?Vitals:  ? 06/25/21 1518  ?TempSrc: Oral  ?PainSc: 0-No pain  ? ? ?  ?  ?  ?  ?  ?  ? ?Melanie Pellot C Morton Simson ? ? ? ? ?

## 2021-06-29 ENCOUNTER — Encounter (HOSPITAL_COMMUNITY): Payer: Self-pay | Admitting: Emergency Medicine

## 2021-06-29 ENCOUNTER — Emergency Department (HOSPITAL_COMMUNITY)
Admission: EM | Admit: 2021-06-29 | Discharge: 2021-06-29 | Disposition: A | Discharge status: Home / Self Care | Payer: Medicare Other | Attending: Emergency Medicine | Admitting: Emergency Medicine

## 2021-06-29 ENCOUNTER — Other Ambulatory Visit: Payer: Self-pay

## 2021-06-29 DIAGNOSIS — Z79899 Other long term (current) drug therapy: Secondary | ICD-10-CM | POA: Insufficient documentation

## 2021-06-29 DIAGNOSIS — T783XXA Angioneurotic edema, initial encounter: Secondary | ICD-10-CM

## 2021-06-29 DIAGNOSIS — Z7982 Long term (current) use of aspirin: Secondary | ICD-10-CM | POA: Diagnosis not present

## 2021-06-29 LAB — CBC WITH DIFFERENTIAL/PLATELET
Abs Immature Granulocytes: 0.02 10*3/uL (ref 0.00–0.07)
Basophils Absolute: 0.1 10*3/uL (ref 0.0–0.1)
Basophils Relative: 1 %
Eosinophils Absolute: 0.4 10*3/uL (ref 0.0–0.5)
Eosinophils Relative: 5 %
HCT: 40.3 % (ref 39.0–52.0)
Hemoglobin: 12.5 g/dL — ABNORMAL LOW (ref 13.0–17.0)
Immature Granulocytes: 0 %
Lymphocytes Relative: 29 %
Lymphs Abs: 2.3 10*3/uL (ref 0.7–4.0)
MCH: 28 pg (ref 26.0–34.0)
MCHC: 31 g/dL (ref 30.0–36.0)
MCV: 90.2 fL (ref 80.0–100.0)
Monocytes Absolute: 0.6 10*3/uL (ref 0.1–1.0)
Monocytes Relative: 8 %
Neutro Abs: 4.5 10*3/uL (ref 1.7–7.7)
Neutrophils Relative %: 57 %
Platelets: 196 10*3/uL (ref 150–400)
RBC: 4.47 MIL/uL (ref 4.22–5.81)
RDW: 18.8 % — ABNORMAL HIGH (ref 11.5–15.5)
WBC: 7.9 10*3/uL (ref 4.0–10.5)
nRBC: 0 % (ref 0.0–0.2)

## 2021-06-29 LAB — COMPREHENSIVE METABOLIC PANEL
ALT: 16 U/L (ref 0–44)
AST: 21 U/L (ref 15–41)
Albumin: 3.9 g/dL (ref 3.5–5.0)
Alkaline Phosphatase: 140 U/L — ABNORMAL HIGH (ref 38–126)
Anion gap: 4 — ABNORMAL LOW (ref 5–15)
BUN: 18 mg/dL (ref 8–23)
CO2: 25 mmol/L (ref 22–32)
Calcium: 9.6 mg/dL (ref 8.9–10.3)
Chloride: 111 mmol/L (ref 98–111)
Creatinine, Ser: 1.79 mg/dL — ABNORMAL HIGH (ref 0.61–1.24)
GFR, Estimated: 40 mL/min — ABNORMAL LOW (ref >60)
Glucose, Bld: 93 mg/dL (ref 70–99)
Potassium: 4.5 mmol/L (ref 3.5–5.1)
Sodium: 140 mmol/L (ref 135–145)
Total Bilirubin: 0.4 mg/dL (ref 0.3–1.2)
Total Protein: 8.3 g/dL — ABNORMAL HIGH (ref 6.5–8.1)

## 2021-06-29 LAB — SURGICAL PATHOLOGY

## 2021-06-29 MED ORDER — FAMOTIDINE 20 MG PO TABS: 20.0000 mg | ORAL_TABLET | Freq: Two times a day (BID) | ORAL | 0 refills | Status: DC

## 2021-06-29 MED ORDER — AMLODIPINE BESYLATE 10 MG PO TABS: 10.0000 mg | ORAL_TABLET | Freq: Every day | ORAL | 3 refills | Status: DC

## 2021-06-29 MED ORDER — FAMOTIDINE 20 MG PO TABS
10.0000 mg | ORAL_TABLET | Freq: Once | ORAL | Status: AC
  Administered 2021-06-29: 10 mg via ORAL
  Filled 2021-06-29: qty 1

## 2021-06-29 MED ORDER — DIPHENHYDRAMINE HCL 25 MG PO TABS: 25.0000 mg | ORAL_TABLET | Freq: Three times a day (TID) | ORAL | 0 refills | Status: DC

## 2021-06-29 MED ORDER — DIPHENHYDRAMINE HCL 25 MG PO CAPS
25.0000 mg | ORAL_CAPSULE | Freq: Once | ORAL | Status: AC
  Administered 2021-06-29: 25 mg via ORAL
  Filled 2021-06-29: qty 1

## 2021-06-29 MED ORDER — CARVEDILOL 6.25 MG PO TABS: 6.2500 mg | ORAL_TABLET | Freq: Two times a day (BID) | ORAL | 3 refills | Status: AC

## 2021-06-29 NOTE — ED Provider Notes (Signed)
?[facility] ?Provider Note ? ? ?CSN: [id number] ?Arrival date & time: 06/29/21  1215 ? ?  ? ?History ? ?Chief Complaint  ?Patient presents with  ? Allergic Reaction  ? ? ?Fernando Dean is a 71 y.o. male. ? ?HPI ?Patient presents with concern of tongue swelling.  Onset was fairly sudden, about 3 hours ago, since that time he has had persistent swelling of the tongue, with what he reports to be occasional difficulty swallowing, speaking, breathing.  Currently no other pain or complaints.  He has been taking his medication as directed including lisinopril and amlodipine earlier to ?  ? ?Home Medications ?Prior to Admission medications   ?Medication Sig Start Date End Date Taking? Authorizing Provider  ?albuterol (VENTOLIN HFA) 108 (90 Base) MCG/ACT inhaler Inhale 2 puffs into the lungs every 6 (six) hours as needed for wheezing or shortness of breath. 05/18/21   Lindell Spar, MD  ?amLODipine (NORVASC) 10 MG tablet Take 1 tablet (10 mg total) by mouth daily. 02/10/21   Dwyane Dee, MD  ?aspirin EC 81 MG EC tablet Take 1 tablet (81 mg total) by mouth daily at 6 (six) AM. Swallow whole. 02/28/21   Dagoberto Ligas, PA-C  ?atorvastatin (LIPITOR) 40 MG tablet Take 1 tablet (40 mg total) by mouth daily. 03/25/21 06/23/21  Early Osmond, MD  ?carvedilol (COREG) 6.25 MG tablet Take 1 tablet (6.25 mg total) by mouth 2 (two) times daily with a meal. 02/09/21   Dwyane Dee, MD  ?lisinopril (ZESTRIL) 5 MG tablet Take 1 tablet by mouth daily.    [provider]  ?Multiple Vitamins-Minerals (MULTIVITAMIN WITH MINERALS) tablet Take 1 tablet by mouth daily. Centrum Silver for Men 50+    [provider]  ?nicotine (NICODERM CQ - DOSED IN MG/24 HOURS) 21 mg/24hr patch Place 21 mg onto the skin daily. 02/18/21   [provider]  ?simvastatin (ZOCOR) 20 MG tablet Take 20 mg by mouth at bedtime. 05/29/21   [provider]  ?   ? ?Allergies    ?Nsaids   ? ?Review of Systems    ?Review of Systems  ?All other systems reviewed and are negative. ? ?Physical Exam ?Updated Vital Signs ?BP (!) 147/77   Pulse 60   Temp 98 ?F (36.7 ?C) (Oral)   Resp 15   Ht 6' (1.829 m)   Wt 77.1 kg   SpO2 100%   BMI 23.06 kg/m?  ?Physical Exam ?HENT:  ?   Mouth/Throat:  ? ? ? ?ED Results / Procedures / Treatments   ?Labs ?(all labs ordered are listed, but only abnormal results are displayed) ?Labs Reviewed  ?CBC WITH DIFFERENTIAL/PLATELET - Abnormal; Notable for the following components:  ?    Result Value  ? Hemoglobin 12.5 (*)   ? RDW 18.8 (*)   ? All other components within normal limits  ?COMPREHENSIVE METABOLIC PANEL  ? ? ?EKG ?None ? ?Radiology ?No results found. ? ?Procedures ?Procedures  ? ? ?Medications Ordered in ED ?Medications - No data to display ? ?ED Course/ Medical Decision Making/ A&P ? ?ED Course/ Medical Decision Making/ A&P ?This patient with a Hx of hypertension presents to the ED for concern of tongue swelling, possible angioedema versus anaphylaxis versus allergic reaction, this involves an extensive number of treatment options, and is a complaint that carries with it a high risk of complications and morbidity.   ? ?The differential diagnosis includes as above, angioedema versus anaphylaxis versus allergic reaction ? ? ?Social  Determinants of Health: ? ?Age ? ?Additional history obtained: ? ?Additional history and/or information obtained from sister, notable for HPI ? ? ?After the initial evaluation, orders, including: Labs, monitoring were initiated. ? ? ?Patient placed on Cardiac and Pulse-Oximetry Monitors. ?The patient was maintained on a cardiac monitor.  The cardiac monitored showed an rhythm of 90 sinus normal ?The patient was also maintained on pulse oximetry. The readings were typically 100% room air normal ? ? ?On repeat evaluation of the patient stayed the same ? ?Lab Tests: ? ?I personally interpreted labs.  The pertinent results include: Unremarkable labs ? ?2:39  PM ?Patient in no distress, hemodynamically about the same, no hypoxia, increased work of breathing, no progression of tongue swelling ?Dispostion / Final MDM: ? ?After consideration of the diagnostic results and the patient's response to treatment, patient is appropriate for discharge.  This adult male with history of hypertension presents with right-sided swelling of his tongue, no airway compromise, no oral or pharyngeal abnormalities, no hemodynamic instability.  Given his use of lisinopril there are some suspicion for this contributing to symptoms, no evidence for concurrent infection, bacteremia, sepsis.  Patient started on antihistamines, after discussion on importance of cessation of his lisinopril, outpatient follow-up for consideration of antihypertensive regimen with his physician. ? ?Final Clinical Impression(s) / ED Diagnoses ?Final diagnoses:  ?Angioedema, initial encounter  ? ? ?Rx / DC Orders ?ED Discharge Orders   ? ? None  ? ?  ? ? ?  ?Carmin Muskrat, MD ?06/29/21 1444 ? ?

## 2021-06-29 NOTE — Discharge Instructions (Addendum)
As discussed, it is important to stop taking lisinopril.  You have been provided a refill for your amlodipine.  Please obtain and take this medication as previously prescribed as well as your carvedilol. ? ?You have been prescribed antihistamines, Pepcid and Benadryl.  Please take these for the next 2 days as directed.   ? ?It important to follow-up with your physician for discussion of your antihypertensive regimen. ? ?Return here for concerning changes in your condition. ?

## 2021-06-29 NOTE — ED Triage Notes (Signed)
Pt experience tongue swelling that started this am, currently taking Lisinopril and Norvasc, pt denies SOB or difficulty breathing. ?

## 2021-07-01 ENCOUNTER — Telehealth (HOSPITAL_COMMUNITY): Payer: Medicare Other | Admitting: Licensed Clinical Social Worker

## 2021-07-02 ENCOUNTER — Encounter (HOSPITAL_COMMUNITY): Payer: Self-pay | Admitting: Gastroenterology

## 2021-07-07 ENCOUNTER — Ambulatory Visit: Payer: Self-pay | Admitting: General Surgery

## 2021-07-07 ENCOUNTER — Telehealth: Payer: Self-pay | Admitting: *Deleted

## 2021-07-07 DIAGNOSIS — C184 Malignant neoplasm of transverse colon: Secondary | ICD-10-CM | POA: Diagnosis not present

## 2021-07-07 NOTE — H&P (Signed)
?REFERRING PHYSICIAN:  Dr Melida Quitter ? ?PROVIDER:  Monico Blitz, MD ? ?MRN: [id number] ?DOB: [birth year] ?DATE OF ENCOUNTER: 07/07/2021 ? ?Subjective  ? ?Chief Complaint: Follow-up (Colon cancer) ?  ? ? ?History of Present Illness: ?Fernando Dean is a 71 y.o. male who is seen today as an office consultation at the request of Dr. Melida Quitter for evaluation of colon cancer.   ?.  Patient is status post robotic colonic resection of a distal transverse colon cancer in January 2023.  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  He then underwent a follow-up completion colonoscopy on Jun 25, 2021.  A mass was found in the proximal transverse colon.  Biopsy showed invasive adenocarcinoma.  Area distal to the mass was tattooed.  Patient is status post endovascular aortic aneurysm repair. ? ? ? ?Review of Systems: ?A complete review of systems was obtained from the patient.  I have reviewed this information and discussed as appropriate with the patient.  See HPI as well for other ROS. ?  ? ? ?Medical History: ?Past Medical History:  ?Diagnosis Date  ? Aneurysm (CMS-HCC)   ? Hypertension   ? ? ?There is no problem list on file for this patient. ? ? ?Past Surgical History:  ?Procedure Laterality Date  ? COLON SURGERY    ? stint    ? TRANSCATHETER INSERTION/REPLACEMENT LEADLESS PACEMAKER    ?  ? ?Allergies  ?Allergen Reactions  ? Ibuprofen Palpitations and Shortness Of Breath  ? Naproxen Shortness Of Breath  ? Nsaids (Non-Steroidal Anti-Inflammatory Drug) Shortness Of Breath  ? Lisinopril Unknown  ? ? ?Current Outpatient Medications on File Prior to Visit  ?Medication Sig Dispense Refill  ? albuterol 90 mcg/actuation inhaler albuterol sulfate HFA 90 mcg/actuation aerosol inhaler ? INHALE 2 PUFFS BY MOUTH EVERY 8 HOURS AS NEEDED    ? amLODIPine (NORVASC) 10 MG tablet Take 1 tablet by mouth once daily    ? simvastatin (ZOCOR) 20 MG tablet simvastatin 20 mg tablet ?  TAKE 1 TABLET BY MOUTH EVERY DAY AT BEDTIME    ? aspirin 81 MG EC tablet Take by mouth    ? atorvastatin (LIPITOR) 40 MG tablet atorvastatin 40 mg tablet    ? bisacodyL (DULCOLAX) 5 mg EC tablet Take 4 tablets (20 mg total) by mouth once daily as needed for Constipation for up to 1 dose 4 tablet 0  ? carvediloL (COREG) 6.25 MG tablet     ? multivitamin with minerals tablet Take by mouth    ? nicotine (NICODERM CQ) 21 mg/24 hr patch Place 1 patch onto the skin once daily    ? ?No current facility-administered medications on file prior to visit.  ? ? ?Family History  ?Problem Relation Age of Onset  ? Obesity Mother   ? High blood pressure (Hypertension) Mother   ? Colon cancer Mother   ? Stroke Father   ? High blood pressure (Hypertension) Father   ? Hyperlipidemia (Elevated cholesterol) Father   ? Coronary Artery Disease (Blocked arteries around heart) Father   ? High blood pressure (Hypertension) Sister   ? Obesity Sister   ?  ? ?Social History  ? ?Tobacco Use  ?Smoking Status Former  ? Types: Cigarettes  ?Smokeless Tobacco Never  ?  ? ?Social History  ? ?Socioeconomic History  ? Marital status: Single  ?Tobacco Use  ? Smoking status: Former  ?  Types: Cigarettes  ? Smokeless tobacco: Never  ?Substance and Sexual Activity  ?  Alcohol use: Yes  ? Drug use: Yes  ?  Types: Marijuana  ? ? ?Objective:  ? ? ?There were no vitals filed for this visit.  ? ?Exam ?Gen: NAD ?Abd: soft, incisions healed ?CV: RRR ?Lungs: CTA ? ? ?Labs, Imaging and Diagnostic Testing: ?Colonoscopy report and pathology reports reviewed. ? ?CT angio of the abdomen and pelvis recently performed with no sign of metastatic disease. ? ?Assessment and Plan:  ?Diagnoses and all orders for this visit: ? ?Malignant neoplasm of transverse colon (CMS-HCC) ? ?  ?Patient with a recent history of left colon resection due to adenocarcinoma now with a right-sided colon cancer in the proximal transverse colon.  This was tattooed distally.  I have recommended  proceeding with a robotic assisted right colectomy.  We discussed a significant risk of chronic diarrhea after this procedure.  We discussed how diarrhea can lead to worsening kidney failure.  We discussed that he will need to either correct his diarrhea or increase his water consumption to avoid this.  We discussed that his risk for surgery would be similar to his previous colon surgery.  We discussed the need to reduce the amount of smoking he does around surgery or quit altogether to avoid postoperative complications is much as possible.  All questions were answered.  We will proceed with surgery as soon as possible. ? ?The surgery and anatomy were described to the patient as well as the risks of surgery and the possible complications.  These include: Bleeding, deep abdominal infections and possible wound complications such as hernia and infection, damage to adjacent structures, leak of surgical connections, which can lead to other surgeries and possibly an ostomy, possible need for other procedures, such as abscess drains in radiology, possible prolonged hospital stay, possible diarrhea from removal of part of the colon, possible constipation from narcotics, possible bowel, bladder or sexual dysfunction if having rectal surgery, prolonged fatigue/weakness or appetite loss, possible early recurrence of of disease, possible complications of their medical problems such as heart disease or arrhythmias or lung problems, death (less than 1%). I believe the patient understands and wishes to proceed with the surgery. ? ?No follow-ups on file. ? ? Rosario Adie, MD ?Colon and Rectal Surgery ?[facility]  ?

## 2021-07-07 NOTE — Telephone Encounter (Signed)
? ?  Pre-operative Risk Assessment  ?  ?Patient Name: Fernando Dean  ?DOB: 10-01-1950 ?MRN: [id number]  ? ?  ? ?Request for Surgical Clearance   ? ?Procedure:   ROBOTIC ASSISTED PARTIAL COLECTOMY ? ?Date of Surgery:  Clearance TBD                              ?   ?Surgeon:  DR. Leighton Ruff ?Surgeon's Group or Practice Name:  [facility] ?Phone number:  [phone] ?Fax number:  [phone] ATTN: [name], LPN ?  ?Type of Clearance Requested:   ?- Medical  PT IS ON ASA 81 MG (NOT LISTED AS TO BE HELD) ? ?REQUESTING DEVICE CLEARANCE. I WILL FORWARD TO DEVICE CLINIC AS WELL ?  ?Type of Anesthesia:  General  ?  ?Additional requests/questions:   ? ?Signed, ?Julaine Hua   ?07/07/2021, 4:46 PM  ? ?

## 2021-07-08 NOTE — Telephone Encounter (Signed)
PERIOPERATIVE PRESCRIPTION FOR IMPLANTED CARDIAC DEVICE PROGRAMMING   Patient Information:  Patient: Fernando Dean  MRN: [id number]  Date of Birth: 05-17-1950     Procedure:   ROBOTIC ASSISTED PARTIAL COLECTOMY   Date of Surgery:  Clearance TBD                                 Surgeon:  DR. Leighton Ruff Surgeon's Group or Practice Name:  [facility] Phone number:  [phone] Fax number:  [phone] ATTN: [name], LPN   Device Information:   Clinic EP Physician:   Dr. Cristopher Peru Device Type:  Pacemaker Manufacturer and Phone #:  [employer]: [phone] Pacemaker Dependent?:  Yes Date of Last Device Check:  05/25/21        Normal Device Function?:  Yes     Electrophysiologist's Recommendations:   Have magnet available. Provide continuous ECG monitoring when magnet is used or reprogramming is to be performed.  Procedure may interfere with device function.  Magnet should be placed over device during procedure.  Per Device Clinic Standing Orders, Willadean Carol  07/08/2021 11:56 AM

## 2021-07-08 NOTE — Telephone Encounter (Signed)
    Name: Fernando Dean  DOB: 09-Mar-1950  MRN: [id number]  Primary Cardiologist: Rozann Lesches, MD - Last OV with gen cards 03/2021, cleared for colectomy but clearance is now out of date. Last EP OV 05/2021, s/p PPM. Just need to touch base to make sure OK to proceed.  Preoperative team, please contact this patient and set up a phone call appointment for further preoperative risk assessment. Please obtain consent and complete medication review. Thank you for your help.  ASA not listed as needing to be held though was referenced on a prior clearance to hold. No specific contraindication identified from cardiac standpoint but likely needs to be directed to d/w vascular given EVAR 02/2021.   Charlie Pitter, PA-C 07/08/2021, 4:00 PM [facility] [address] [place], [place] [address]

## 2021-07-09 ENCOUNTER — Telehealth: Payer: Self-pay | Admitting: *Deleted

## 2021-07-09 NOTE — Telephone Encounter (Signed)
Tele appt for pre op 07/12/21 @ 3 pm. Med rec and consent are done. Pt tells me he is taking both statins on the chart.     Patient Consent for Virtual Visit        [name] has provided verbal consent on 07/09/2021 for a virtual visit (video or telephone).   CONSENT FOR VIRTUAL VISIT FOR:  Fernando Dean  By participating in this virtual visit I agree to the following:  I hereby voluntarily request, consent and authorize [facility] and its employed or contracted physicians, physician assistants, nurse practitioners or other licensed health care professionals (the Practitioner), to provide me with telemedicine health care services (the "Services") as deemed necessary by the treating Practitioner. I acknowledge and consent to receive the Services by the Practitioner via telemedicine. I understand that the telemedicine visit will involve communicating with the Practitioner through live audiovisual communication technology and the disclosure of certain medical information by electronic transmission. I acknowledge that I have been given the opportunity to request an in-person assessment or other available alternative prior to the telemedicine visit and am voluntarily participating in the telemedicine visit.  I understand that I have the right to withhold or withdraw my consent to the use of telemedicine in the course of my care at any time, without affecting my right to future care or treatment, and that the Practitioner or I may terminate the telemedicine visit at any time. I understand that I have the right to inspect all information obtained and/or recorded in the course of the telemedicine visit and may receive copies of available information for a reasonable fee.  I understand that some of the potential risks of receiving the Services via telemedicine include:  Delay or interruption in medical evaluation due to technological equipment failure or disruption; Information transmitted may  not be sufficient (e.g. poor resolution of images) to allow for appropriate medical decision making by the Practitioner; and/or  In rare instances, security protocols could fail, causing a breach of personal health information.  Furthermore, I acknowledge that it is my responsibility to provide information about my medical history, conditions and care that is complete and accurate to the best of my ability. I acknowledge that Practitioner's advice, recommendations, and/or decision may be based on factors not within their control, such as incomplete or inaccurate data provided by me or distortions of diagnostic images or specimens that may result from electronic transmissions. I understand that the practice of medicine is not an exact science and that Practitioner makes no warranties or guarantees regarding treatment outcomes. I acknowledge that a copy of this consent can be made available to me via my patient portal ([facility]), or I can request a printed copy by calling the office of [facility].    I understand that my insurance will be billed for this visit.   I have read or had this consent read to me. I understand the contents of this consent, which adequately explains the benefits and risks of the Services being provided via telemedicine.  I have been provided ample opportunity to ask questions regarding this consent and the Services and have had my questions answered to my satisfaction. I give my informed consent for the services to be provided through the use of telemedicine in my medical care

## 2021-07-09 NOTE — Telephone Encounter (Signed)
Tele appt for pre op 07/12/21 @ 3 pm. Med rec and consent are done. Pt tells me he is taking both statins on the chart.

## 2021-07-12 ENCOUNTER — Encounter: Payer: Self-pay | Admitting: Nurse Practitioner

## 2021-07-12 ENCOUNTER — Ambulatory Visit (INDEPENDENT_AMBULATORY_CARE_PROVIDER_SITE_OTHER): Payer: Medicare Other | Admitting: Nurse Practitioner

## 2021-07-12 DIAGNOSIS — Z01810 Encounter for preprocedural cardiovascular examination: Secondary | ICD-10-CM

## 2021-07-12 NOTE — Progress Notes (Signed)
Virtual Visit via Telephone Note   Because of Fernando Dean's co-morbid illnesses, he is at least at moderate risk for complications without adequate follow up.  This format is felt to be most appropriate for this patient at this time.  The patient did not have access to video technology/had technical difficulties with video requiring transitioning to audio format only (telephone).  All issues noted in this document were discussed and addressed.  No physical exam could be performed with this format.  Please refer to the patient's chart for his consent to telehealth for [facility].  Evaluation Performed:  Preoperative cardiovascular risk assessment _____________   Date:  07/12/2021   Patient ID:  Fernando Dean, DOB 1951/01/04, MRN [id number] Patient Location:  Home Provider location:   Office  Primary Care Provider:  [name], FNP Primary Cardiologist:  Fernando Lesches, Dean  Chief Complaint / Patient Profile   71 y.o. y/o male with a h/o complete heart block s/p PPM implant, tobacco abuse, hypertension, CKD, aortic regurgitation, s/p AAA repair who is pending robotic assisted partial colectomy and presents today for telephonic preoperative cardiovascular risk assessment.  Past Medical History    Past Medical History:  Diagnosis Date   AAA (abdominal aortic aneurysm) (HCC)    Alcohol use    Aortic regurgitation    moderate AR 02/05/21 echo   Chronic kidney disease    stage 3   Colonic mass    Family history of colon cancer 06/08/2021   Heme positive stool    Hypertension    Presence of permanent cardiac pacemaker 02/08/2021   Inserted 02/08/21 for CHB - [employer] MRI 2272 dual chamber PPM   Tuberculosis    as a child, was treated   Past Surgical History:  Procedure Laterality Date   ABDOMINAL AORTIC ENDOVASCULAR STENT GRAFT Bilateral 02/26/2021   Procedure: ABDOMINAL AORTIC ENDOVASCULAR STENT GRAFT REPAIR;  Surgeon: Fernando Heck, Dean;   Location: [facility] OR;  Service: Vascular;  Laterality: Bilateral;   BIOPSY  03/12/2021   Procedure: BIOPSY;  Surgeon: Fernando Quale, Dean;  [facility];  Service: Gastroenterology;;   BIOPSY  06/25/2021   Procedure: BIOPSY;  Surgeon: Fernando Quale, Dean;  [facility];  Service: Gastroenterology;;  mass    COLONOSCOPY WITH PROPOFOL N/A 03/12/2021   Procedure: COLONOSCOPY WITH PROPOFOL;  Surgeon: Fernando Quale, Dean;  [facility];  Service: Gastroenterology;  Laterality: N/A;  10:55   COLONOSCOPY WITH PROPOFOL N/A 06/25/2021   Procedure: COLONOSCOPY WITH PROPOFOL;  Surgeon: Fernando Quale, Dean;  [facility];  Service: Gastroenterology;  Laterality: N/A;  235 ASA 2   HEMOSTASIS CLIP PLACEMENT  03/12/2021   Procedure: HEMOSTASIS CLIP PLACEMENT;  Surgeon: Fernando Quale, Dean;  [facility];  Service: Gastroenterology;;   HEMOSTASIS CLIP PLACEMENT  06/25/2021   Procedure: HEMOSTASIS CLIP PLACEMENT;  Surgeon: Fernando Quale, Dean;  [facility];  Service: Gastroenterology;;   HERNIA REPAIR Left 10/25/2006   PACEMAKER IMPLANT N/A 02/08/2021   Procedure: PACEMAKER IMPLANT;  Surgeon: Fernando Sprang, Dean;  Location: [facility];  Service: Cardiovascular;  Laterality: N/A;   POLYPECTOMY  03/12/2021   Procedure: POLYPECTOMY;  Surgeon: Fernando Quale, Dean;  [facility];  Service: Gastroenterology;;   POLYPECTOMY  06/25/2021   Procedure: POLYPECTOMY;  Surgeon: Fernando Quale, Dean;  [facility];  Service: Gastroenterology;;   [name]  03/12/2021  Procedure: SCLEROTHERAPY;  Surgeon: Fernando Dean;  [facility];  Service: Gastroenterology;;    Allergies  Allergies  Allergen Reactions   Nsaids Shortness Of Breath and Palpitations    History of Present Illness    Fernando Dean is a 71 y.o. male who  presents via audio/video conferencing for a telehealth visit today.  Pt was last seen in cardiology clinic on 05/25/21 by Dr. Lovena Le.  At that time Fernando Dean was doing well .  The patient is now pending procedure as outlined above. Since his last visit, he denies chest pain, shortness of breath, lower extremity edema, fatigue, palpitations, melena, hematuria, hemoptysis, diaphoresis, weakness, presyncope, syncope, orthopnea, and PND.   Home Medications    Prior to Admission medications   Medication Sig Start Date End Date Taking? Authorizing Provider  albuterol (VENTOLIN HFA) 108 (90 Base) MCG/ACT inhaler Inhale 2 puffs into the lungs every 6 (six) hours as needed for wheezing or shortness of breath. 05/18/21   Fernando Spar, Dean  amLODipine (NORVASC) 10 MG tablet Take 1 tablet (10 mg total) by mouth daily. 06/29/21   Fernando Muskrat, Dean  aspirin EC 81 MG EC tablet Take 1 tablet (81 mg total) by mouth daily at 6 (six) AM. Swallow whole. 02/28/21   Fernando Ligas, PA-C  atorvastatin (LIPITOR) 40 MG tablet Take 1 tablet (40 mg total) by mouth daily. 03/25/21 07/09/21  Fernando Osmond, Dean  carvedilol (COREG) 6.25 MG tablet Take 1 tablet (6.25 mg total) by mouth 2 (two) times daily with a meal. 06/29/21   Fernando Muskrat, Dean  diphenhydrAMINE (BENADRYL) 25 MG tablet Take 1 tablet (25 mg total) by mouth 3 (three) times daily. Take one tablet three times daily for two days Patient taking differently: Take 25 mg by mouth every 6 (six) hours as needed. Take one tablet three times daily for two days 06/29/21   Fernando Muskrat, Dean  famotidine (PEPCID) 20 MG tablet Take 1 tablet (20 mg total) by mouth 2 (two) times daily for 2 days. Patient not taking: Reported on 07/09/2021 06/29/21 07/01/21  Fernando Muskrat, Dean  Multiple Vitamins-Minerals (MULTIVITAMIN WITH MINERALS) tablet Take 1 tablet by mouth daily. Centrum Silver for Men 50+    Provider, Historical, Dean  nicotine (NICODERM CQ - DOSED IN MG/24 HOURS) 21  mg/24hr patch Place 21 mg onto the skin daily. 02/18/21   Provider, Historical, Dean  simvastatin (ZOCOR) 20 MG tablet Take 20 mg by mouth at bedtime. 05/29/21   Provider, Historical, Dean    Physical Exam    Vital Signs:  Fernando Dean does not have vital signs available for review today.  Given telephonic nature of communication, physical exam is limited. AAOx3. NAD. Normal affect.  Speech and respirations are unlabored.  Accessory Clinical Findings    None  Assessment & Plan    1.  Preoperative Cardiovascular Risk Assessment: He is doing well from a cardiac perspective and may proceed without further cardiac testing.   According to the Revised Cardiac Risk Index (RCRI), his Perioperative Risk of Major Cardiac Event is (%): 0.9. His Functional Capacity in METs is: 7.59 according to the Duke Activity Status Index (DASI).  Question of holding aspirin for procedure needs to be directed to Dr. Carlis Abbott, with [facility] due to patient is s/p EVAR 02/26/21.  A copy of this note will be routed to requesting surgeon.  Time:   Today, I have spent 10 minutes with the patient with telehealth technology discussing  medical history, symptoms, and management plan.    Emmaline Life, NP-C    07/12/2021, 3:09 PM [place] [address] Office [phone] Fax [phone]

## 2021-07-26 NOTE — Progress Notes (Addendum)
COVID Vaccine Completed: Yes  Date of COVID positive in last 90 days:  PCP - [name], FNP Cardiologist - Rozann Lesches, MD Vascular - Fortunato Curling, MD  Cardiac clearance in Epic dated 07-12-21 by Christen Bame, NP-C  Chest x-ray - 02-26-21 Epic EKG - 03-25-21 Epic Stress Test -  ECHO - 02-05-21 Epic Cardiac Cath -  Pacemaker/ICD device last checked: 05-10-21 Epic Spinal Cord Stimulator:  Bowel Prep - Clear liquids day of prep Dulcolax, Miralax  Sleep Study -  CPAP -   Fasting Blood Sugar -  Checks Blood Sugar _____ times a day  Blood Thinner Instructions: Aspirin Instructions:  ASA 81 mg  Last Dose:  Activity level:  Can go up a flight of stairs and perform activities of daily living without stopping and without symptoms of chest pain or shortness of breath.  Able to exercise without symptoms  Unable to go up a flight of stairs without symptoms of     Anesthesia review: Complete heart block,Pacemaker,  AAA with repair, aoritc regurg, emphysema, CKD, HTN  Patient denies shortness of breath, fever, cough and chest pain at PAT appointment  Patient verbalized understanding of instructions that were given to them at the PAT appointment. Patient was also instructed that they will need to review over the PAT instructions again at home before surgery.

## 2021-07-26 NOTE — Patient Instructions (Signed)
DUE TO COVID-19 ONLY TWO VISITORS  (aged 71 and older)  IS ALLOWED TO COME WITH YOU AND STAY IN THE WAITING ROOM ONLY DURING PRE OP AND PROCEDURE.   **NO VISITORS ARE ALLOWED IN THE SHORT STAY AREA OR RECOVERY ROOM!!**  IF YOU WILL BE ADMITTED INTO THE HOSPITAL YOU ARE ALLOWED ONLY FOUR SUPPORT PEOPLE DURING VISITATION HOURS ONLY (7 AM -8PM)   The support person(s) must pass our screening, gel in and out Visitors GUEST BADGE MUST BE WORN VISIBLY  One adult visitor may remain with you overnight and MUST be in the room by 8 P.M.   You are not required to [employer] often Do NOT share personal items Notify your provider if you are in close contact with someone who has COVID or you develop fever 100.4 or greater, new onset of sneezing, cough, sore throat, shortness of breath or body aches.        Your procedure is scheduled on:  08-18-21   Report to [facility] Main Entrance    Report to admitting at 12:15 PM   Call this number if you have problems the morning of surgery [phone]   Follow a clear liquid diet day of prep to prevent dehydration   May have clear liquids until 11:30 AM DAY OF SURGERY  Clear Liquid Diet Water Black Coffee (sugar ok, NO MILK/CREAM OR CREAMERS)  Tea (sugar ok, NO MILK/CREAM OR CREAMERS) regular and decaf                             Plain Jell-O (NO RED)                                           Fruit ices (not with fruit pulp, NO RED)                                     Popsicles (NO RED)                                                                  Juice: apple, WHITE grape, WHITE cranberry Sports drinks like Gatorade (NO RED) Clear broth(vegetable,chicken,beef)  Complete 2 Pre Surgery Ensure the evening before surgery (have completed by 10 PM)                The day of surgery:  Drink ONE (1) Pre-Surgery Clear Ensure at 11:30 AM the morning of surgery. Drink in one sitting. Do not sip.  This drink was given to you during  your hospital  pre-op appointment visit. Nothing else to drink after completing the Pre-Surgery Clear Ensure          If you have questions, please contact your surgeon's office.   FOLLOW BOWEL PREP AND ANY ADDITIONAL PRE OP INSTRUCTIONS YOU RECEIVED FROM YOUR SURGEON'S OFFICE!!!  Miralax, Dulcolax, Neomycin and Metronidazole     Oral Hygiene is also important to reduce your risk of infection.  Remember - BRUSH YOUR TEETH THE MORNING OF SURGERY WITH YOUR REGULAR TOOTHPASTE   Do NOT smoke after Midnight   Take these medicines the morning of surgery with A SIP OF WATER:  Amlodipine, Atorvastatin, Carvedilol                              You may not have any metal on your body including jewelry, and body piercing             Do not wear  lotions, powders, cologne, or deodorant              Men may shave face and neck.   Do not bring valuables to the hospital. [place].   Contacts, dentures or bridgework may not be worn into surgery.   Bring small overnight bag day of surgery.   Special Instructions: Bring a copy of your healthcare power of attorney and living will documents the day of surgery if you haven't scanned them before.  Please read over the following fact sheets you were given: IF YOU HAVE QUESTIONS ABOUT YOUR PRE-OP INSTRUCTIONS PLEASE CALL [place] - Preparing for Surgery Before surgery, you can play an important role.  Because skin is not sterile, your skin needs to be as free of germs as possible.  You can reduce the number of germs on your skin by washing with CHG (chlorahexidine gluconate) soap before surgery.  CHG is an antiseptic cleaner which kills germs and bonds with the skin to continue killing germs even after washing. Please DO NOT use if you have an allergy to CHG or antibacterial soaps.  If your skin becomes reddened/irritated stop using the CHG and inform your nurse when  you arrive at Short Stay. Do not shave (including legs and underarms) for at least 48 hours prior to the first CHG shower.  You may shave your face/neck.  Please follow these instructions carefully:  1.  Shower with CHG Soap the night before surgery and the  morning of surgery.  2.  If you choose to wash your hair, wash your hair first as usual with your normal  shampoo.  3.  After you shampoo, rinse your hair and body thoroughly to remove the shampoo.                             4.  Use CHG as you would any other liquid soap.  You can apply chg directly to the skin and wash.  Gently with a scrungie or clean washcloth.  5.  Apply the CHG Soap to your body ONLY FROM THE NECK DOWN.   Do   not use on face/ open                           Wound or open sores. Avoid contact with eyes, ears mouth and   genitals (private parts).                       Wash face,  Genitals (private parts) with your normal soap.             6.  Wash thoroughly, paying special attention to the area where your    surgery  will be performed.  7.  Thoroughly rinse your body with warm water  from the neck down.  8.  DO NOT shower/wash with your normal soap after using and rinsing off the CHG Soap.                9.  Pat yourself dry with a clean towel.            10.  Wear clean pajamas.            11.  Place clean sheets on your bed the night of your first shower and do not  sleep with pets. Day of Surgery : Do not apply any lotions/deodorants the morning of surgery.  Please wear clean clothes to the hospital/surgery center.  FAILURE TO FOLLOW THESE INSTRUCTIONS MAY RESULT IN THE CANCELLATION OF YOUR SURGERY  PATIENT SIGNATURE_________________________________  NURSE SIGNATURE__________________________________  ________________________________________________________________________     Adam Phenix  An incentive spirometer is a tool that can help keep your lungs clear and active. This tool measures how well  you are filling your lungs with each breath. Taking long deep breaths may help reverse or decrease the chance of developing breathing (pulmonary) problems (especially infection) following: A long period of time when you are unable to move or be active. BEFORE THE PROCEDURE  If the spirometer includes an indicator to show your best effort, your nurse or respiratory therapist will set it to a desired goal. If possible, sit up straight or lean slightly forward. Try not to slouch. Hold the incentive spirometer in an upright position. INSTRUCTIONS FOR USE  Sit on the edge of your bed if possible, or sit up as far as you can in bed or on a chair. Hold the incentive spirometer in an upright position. Breathe out normally. Place the mouthpiece in your mouth and seal your lips tightly around it. Breathe in slowly and as deeply as possible, raising the piston or the ball toward the top of the column. Hold your breath for 3-5 seconds or for as long as possible. Allow the piston or ball to fall to the bottom of the column. Remove the mouthpiece from your mouth and breathe out normally. Rest for a few seconds and repeat Steps 1 through 7 at least 10 times every 1-2 hours when you are awake. Take your time and take a few normal breaths between deep breaths. The spirometer may include an indicator to show your best effort. Use the indicator as a goal to work toward during each repetition. After each set of 10 deep breaths, practice coughing to be sure your lungs are clear. If you have an incision (the cut made at the time of surgery), support your incision when coughing by placing a pillow or rolled up towels firmly against it. Once you are able to get out of bed, walk around indoors and cough well. You may stop using the incentive spirometer when instructed by your caregiver.  RISKS AND COMPLICATIONS Take your time so you do not get dizzy or light-headed. If you are in pain, you may need to take or ask for  pain medication before doing incentive spirometry. It is harder to take a deep breath if you are having pain. AFTER USE Rest and breathe slowly and easily. It can be helpful to keep track of a log of your progress. Your caregiver can provide you with a simple table to help with this. If you are using the spirometer at home, follow these instructions: [place] IF:  You are having difficultly using the spirometer. You have trouble using the  spirometer as often as instructed. Your pain medication is not giving enough relief while using the spirometer. You develop fever of 100.5 F (38.1 C) or higher. SEEK IMMEDIATE MEDICAL CARE IF:  You cough up bloody sputum that had not been present before. You develop fever of 102 F (38.9 C) or greater. You develop worsening pain at or near the incision site. MAKE SURE YOU:  Understand these instructions. Will watch your condition. Will get help right away if you are not doing well or get worse. Document Released: 06/20/2006 Document Revised: 05/02/2011 Document Reviewed: 08/21/2006 Exit[employer] Patient Information 2014 [employer], [place].   ________________________________________________________________________  WHAT IS A BLOOD TRANSFUSION? Blood Transfusion Information  A transfusion is the replacement of blood or some of its parts. Blood is made up of multiple cells which provide different functions. Red blood cells carry oxygen and are used for blood loss replacement. White blood cells fight against infection. Platelets control bleeding. Plasma helps clot blood. Other blood products are available for specialized needs, such as hemophilia or other clotting disorders. BEFORE THE TRANSFUSION  Who gives blood for transfusions?  Healthy volunteers who are fully evaluated to make sure their blood is safe. This is blood bank blood. Transfusion therapy is the safest it has ever been in the practice of medicine. Before blood is taken from a donor,  a complete history is taken to make sure that person has no history of diseases nor engages in risky social behavior (examples are intravenous drug use or sexual activity with multiple partners). The donor's travel history is screened to minimize risk of transmitting infections, such as malaria. The donated blood is tested for signs of infectious diseases, such as HIV and hepatitis. The blood is then tested to be sure it is compatible with you in order to minimize the chance of a transfusion reaction. If you or a relative donates blood, this is often done in anticipation of surgery and is not appropriate for emergency situations. It takes many days to process the donated blood. RISKS AND COMPLICATIONS Although transfusion therapy is very safe and saves many lives, the main dangers of transfusion include:  Getting an infectious disease. Developing a transfusion reaction. This is an allergic reaction to something in the blood you were given. Every precaution is taken to prevent this. The decision to have a blood transfusion has been considered carefully by your caregiver before blood is given. Blood is not given unless the benefits outweigh the risks. AFTER THE TRANSFUSION Right after receiving a blood transfusion, you will usually feel much better and more energetic. This is especially true if your red blood cells have gotten low (anemic). The transfusion raises the level of the red blood cells which carry oxygen, and this usually causes an energy increase. The nurse administering the transfusion will monitor you carefully for complications. HOME CARE INSTRUCTIONS  No special instructions are needed after a transfusion. You may find your energy is better. Speak with your caregiver about any limitations on activity for underlying diseases you may have. SEEK MEDICAL CARE IF:  Your condition is not improving after your transfusion. You develop redness or irritation at the intravenous (IV) site. SEEK  IMMEDIATE MEDICAL CARE IF:  Any of the following symptoms occur over the next 12 hours: Shaking chills. You have a temperature by mouth above 102 F (38.9 C), not controlled by medicine. Chest, back, or muscle pain. People around you feel you are not acting correctly or are confused. Shortness of breath or difficulty breathing. Dizziness  and fainting. You get a rash or develop hives. You have a decrease in urine output. Your urine turns a dark color or changes to pink, red, or brown. Any of the following symptoms occur over the next 10 days: You have a temperature by mouth above 102 F (38.9 C), not controlled by medicine. Shortness of breath. Weakness after normal activity. The white part of the eye turns yellow (jaundice). You have a decrease in the amount of urine or are urinating less often. Your urine turns a dark color or changes to pink, red, or brown. Document Released: 02/05/2000 Document Revised: 05/02/2011 Document Reviewed: 09/24/2007 [facility] Patient Information 2014 [place], [place].  _______________________________________________________________________

## 2021-08-03 ENCOUNTER — Encounter (HOSPITAL_COMMUNITY)
Admission: RE | Admit: 2021-08-03 | Discharge: 2021-08-03 | Disposition: A | Discharge status: Home / Self Care | Payer: Medicare Other | Source: Ambulatory Visit | Attending: Family Medicine | Admitting: Family Medicine

## 2021-08-09 ENCOUNTER — Ambulatory Visit (INDEPENDENT_AMBULATORY_CARE_PROVIDER_SITE_OTHER): Payer: Medicare Other

## 2021-08-09 DIAGNOSIS — I442 Atrioventricular block, complete: Secondary | ICD-10-CM

## 2021-08-12 ENCOUNTER — Encounter: Payer: Self-pay | Admitting: Internal Medicine

## 2021-08-12 ENCOUNTER — Encounter (HOSPITAL_COMMUNITY): Payer: Self-pay

## 2021-08-12 LAB — CUP PACEART REMOTE DEVICE CHECK
Battery Remaining Longevity: 119 mo
Battery Remaining Percentage: 95.5 %
Battery Voltage: 3.02 V
Brady Statistic AP VP Percent: 17 %
Brady Statistic AP VS Percent: 1 %
Brady Statistic AS VP Percent: 82 %
Brady Statistic AS VS Percent: 1 %
Brady Statistic RA Percent Paced: 16 %
Brady Statistic RV Percent Paced: 99 %
Date Time Interrogation Session: 20230619020014
Implantable Lead Implant Date: 20221219
Implantable Lead Implant Date: 20221219
Implantable Lead Location: 753859
Implantable Lead Location: 753860
Implantable Pulse Generator Implant Date: 20221219
Lead Channel Impedance Value: 390 Ohm
Lead Channel Impedance Value: 490 Ohm
Lead Channel Pacing Threshold Amplitude: 0.375 V
Lead Channel Pacing Threshold Amplitude: 0.875 V
Lead Channel Pacing Threshold Pulse Width: 0.5 ms
Lead Channel Pacing Threshold Pulse Width: 0.5 ms
Lead Channel Sensing Intrinsic Amplitude: 12 mV
Lead Channel Sensing Intrinsic Amplitude: 4.7 mV
Lead Channel Setting Pacing Amplitude: 1.125
Lead Channel Setting Pacing Amplitude: 1.375
Lead Channel Setting Pacing Pulse Width: 0.5 ms
Lead Channel Setting Sensing Sensitivity: 2 mV
Pulse Gen Model: 2272
Pulse Gen Serial Number: 3985814

## 2021-08-12 NOTE — Patient Instructions (Addendum)
DUE TO COVID-19 ONLY TWO VISITORS  (aged 71 and older)  ARE ALLOWED TO COME WITH YOU AND STAY IN THE WAITING ROOM ONLY DURING PRE OP AND PROCEDURE.   **NO VISITORS ARE ALLOWED IN THE SHORT STAY AREA OR RECOVERY ROOM!!**  IF YOU WILL BE ADMITTED INTO THE HOSPITAL YOU ARE ALLOWED ONLY FOUR SUPPORT PEOPLE DURING VISITATION HOURS ONLY (7 AM -8PM)   The support person(s) must pass our screening, gel in and out, and wear a mask at all times, including in the patient's room. Patients must also wear a mask when staff or their support person are in the room. Visitors GUEST BADGE MUST BE WORN VISIBLY  One adult visitor may remain with you overnight and MUST be in the room by 8 P.M.     Your procedure is scheduled on: 08/18/21   Report to [facility] Main Entrance    Report to admitting at  5:15 AM   Call this number if you have problems the morning of surgery [phone]   Do not eat food :After Midnight.on the 27th   After Midnight you may have the following liquids until _4:30_ AM/  DAY OF SURGERY  Water Black Coffee (sugar ok, NO MILK/CREAM OR CREAMERS)  Tea (sugar ok, NO MILK/CREAM OR CREAMERS) regular and decaf                             Plain Jell-O (NO RED)                                           Fruit ices (not with fruit pulp, NO RED)                                     Popsicles (NO RED)                                                                  Juice: apple, WHITE grape, WHITE cranberry Sports drinks like Gatorade (NO RED) Clear broth(vegetable,chicken,beef)               Drink 2 Ensure/G2 drinks AT 10:00 PM the night before surgery.  The night of the 27th      The day of surgery:  Drink ONE (1) Pre-Surgery Clear Ensure  at  4:15 AM the morning of surgery. Drink in one sitting. Do not sip.  This drink was given to you during your hospital  pre-op appointment visit. Nothing else to drink after completing the  Pre-Surgery Clear Ensure at 4:30 AM           If you have questions, please contact your surgeon's office.   FOLLOW BOWEL PREP AND ANY ADDITIONAL PRE OP INSTRUCTIONS YOU RECEIVED FROM YOUR SURGEON'S OFFICE!!!   Drink plenty of fluids on prep day to prevent dehydration.   Oral Hygiene is also important to reduce your risk of infection.  Remember - BRUSH YOUR TEETH THE MORNING OF SURGERY WITH YOUR REGULAR TOOTHPASTE   Do NOT smoke after Midnight   Take these medicines the morning of surgery with A SIP OF WATER: Carvedilol, Amlodipine. Use your inhalers and bring them with you   Before surgery.Stop taking _ASA 81 mg__________on __________as instructed by _____________.   Contact your Surgeon/Cardiologist for instructions on Anticoagulant Therapy prior to surgery.   Bring CPAP mask and tubing day of surgery.                              You may not have any metal on your body including hair pins, jewelry, and body piercing             Do not wear make-up, lotions, powders, perfumes/cologne, or deodorant                Men may shave face and neck.   Do not bring valuables to the hospital. [place].   Contacts, dentures or bridgework may not be worn into surgery.   Bring small overnight bag day of surgery.   DO NOT [place]. PHARMACY WILL DISPENSE MEDICATIONS LISTED ON YOUR MEDICATION LIST TO YOU DURING YOUR ADMISSION [place]!       Special Instructions: Bring a copy of your healthcare power of attorney and living will documents the day of surgery if you haven't scanned them before.              Please read over the following fact sheets you were given: IF YOU HAVE QUESTIONS ABOUT YOUR PRE-OP INSTRUCTIONS PLEASE CALL [phone]     [facility] - Preparing for Surgery Before surgery, you can play an important role.  Because skin is not sterile, your skin needs to be as free of germs as possible.   You can reduce the number of germs on your skin by washing with CHG (chlorahexidine gluconate) soap before surgery.  CHG is an antiseptic cleaner which kills germs and bonds with the skin to continue killing germs even after washing. Please DO NOT use if you have an allergy to CHG or antibacterial soaps.  If your skin becomes reddened/irritated stop using the CHG and inform your nurse when you arrive at Short Stay. You may shave your face/neck. Please follow these instructions carefully:  1.  Shower with CHG Soap the night before surgery and the  morning of Surgery.  2.  If you choose to wash your hair, wash your hair first as usual with your  normal  shampoo.  3.  After you shampoo, rinse your hair and body thoroughly to remove the  shampoo.                            4.  Use CHG as you would any other liquid soap.  You can apply chg directly  to the skin and wash                       Gently with a scrungie or clean washcloth.  5.  Apply the CHG Soap to your body ONLY FROM THE NECK DOWN.   Do not use on face/ open  Wound or open sores. Avoid contact with eyes, ears mouth and genitals (private parts).                       Wash face,  Genitals (private parts) with your normal soap.             6.  Wash thoroughly, paying special attention to the area where your surgery  will be performed.  7.  Thoroughly rinse your body with warm water from the neck down.  8.  DO NOT shower/wash with your normal soap after using and rinsing off  the CHG Soap.                9.  Pat yourself dry with a clean towel.            10.  Wear clean pajamas.            11.  Place clean sheets on your bed the night of your first shower and do not  sleep with pets. Day of Surgery : Do not apply any lotions/deodorants the morning of surgery.  Please wear clean clothes to the hospital/surgery center.  FAILURE TO FOLLOW THESE INSTRUCTIONS MAY RESULT IN THE CANCELLATION OF YOUR  SURGERY   ________________________________________________________________________   Incentive Spirometer  An incentive spirometer is a tool that can help keep your lungs clear and active. This tool measures how well you are filling your lungs with each breath. Taking long deep breaths may help reverse or decrease the chance of developing breathing (pulmonary) problems (especially infection) following: A long period of time when you are unable to move or be active. BEFORE THE PROCEDURE  If the spirometer includes an indicator to show your best effort, your nurse or respiratory therapist will set it to a desired goal. If possible, sit up straight or lean slightly forward. Try not to slouch. Hold the incentive spirometer in an upright position. INSTRUCTIONS FOR USE  Sit on the edge of your bed if possible, or sit up as far as you can in bed or on a chair. Hold the incentive spirometer in an upright position. Breathe out normally. Place the mouthpiece in your mouth and seal your lips tightly around it. Breathe in slowly and as deeply as possible, raising the piston or the ball toward the top of the column. Hold your breath for 3-5 seconds or for as long as possible. Allow the piston or ball to fall to the bottom of the column. Remove the mouthpiece from your mouth and breathe out normally. Rest for a few seconds and repeat Steps 1 through 7 at least 10 times every 1-2 hours when you are awake. Take your time and take a few normal breaths between deep breaths. The spirometer may include an indicator to show your best effort. Use the indicator as a goal to work toward during each repetition. After each set of 10 deep breaths, practice coughing to be sure your lungs are clear. If you have an incision (the cut made at the time of surgery), support your incision when coughing by placing a pillow or rolled up towels firmly against it. Once you are able to get out of bed, walk around indoors and cough  well. You may stop using the incentive spirometer when instructed by your caregiver.  RISKS AND COMPLICATIONS Take your time so you do not get dizzy or light-headed. If you are in pain, you may need to take or ask for pain medication  before doing incentive spirometry. It is harder to take a deep breath if you are having pain. AFTER USE Rest and breathe slowly and easily. It can be helpful to keep track of a log of your progress. Your caregiver can provide you with a simple table to help with this. If you are using the spirometer at home, follow these instructions: [place] IF:  You are having difficultly using the spirometer. You have trouble using the spirometer as often as instructed. Your pain medication is not giving enough relief while using the spirometer. You develop fever of 100.5 F (38.1 C) or higher. SEEK IMMEDIATE MEDICAL CARE IF:  You cough up bloody sputum that had not been present before. You develop fever of 102 F (38.9 C) or greater. You develop worsening pain at or near the incision site. MAKE SURE YOU:  Understand these instructions. Will watch your condition. Will get help right away if you are not doing well or get worse. Document Released: 06/20/2006 Document Revised: 05/02/2011 Document Reviewed: 08/21/2006 [facility] Patient Information 2014 [place], [place].   ________________________________________________________________________

## 2021-08-12 NOTE — Progress Notes (Signed)
[place] DEVICE PROGRAMMING  Patient Information: Name:  Fernando Dean  DOB:  02-17-1951  MRN:  [id number]    Planned Procedure:  Robotic assisted partial colectomy  Surgeon: Dr Leighton Ruff  Date of Procedure:  08/18/21  Cautery will be used.  Position during surgery:      Please send documentation back to:  [facility] (Fax # [phone])  Device Information:  Clinic EP Physician:  Virl Axe, MD   Device Type:  Pacemaker Manufacturer and Phone #:  St. Jude/Abbott: [phone] Pacemaker Dependent?:  Yes.   Date of Last Device Check:  08/09/21 ~ Remote Normal Device Function?:  Yes.    Electrophysiologist's Recommendations:  Have magnet available. Provide continuous ECG monitoring when magnet is used or reprogramming is to be performed.  Procedure may interfere with device function.  Magnet should be placed over device during procedure.  Per Device Clinic Standing Orders, Simone Curia, RN  11:36 AM 08/12/2021

## 2021-08-13 ENCOUNTER — Encounter (HOSPITAL_COMMUNITY): Payer: Self-pay

## 2021-08-13 ENCOUNTER — Other Ambulatory Visit: Payer: Self-pay

## 2021-08-13 ENCOUNTER — Encounter (HOSPITAL_COMMUNITY)
Admission: RE | Admit: 2021-08-13 | Discharge: 2021-08-13 | Disposition: A | Discharge status: Home / Self Care | Payer: Medicare Other | Source: Ambulatory Visit | Attending: General Surgery | Admitting: General Surgery

## 2021-08-13 DIAGNOSIS — I129 Hypertensive chronic kidney disease with stage 1 through stage 4 chronic kidney disease, or unspecified chronic kidney disease: Secondary | ICD-10-CM | POA: Diagnosis not present

## 2021-08-13 DIAGNOSIS — Z950 Presence of cardiac pacemaker: Secondary | ICD-10-CM | POA: Diagnosis not present

## 2021-08-13 DIAGNOSIS — Z952 Presence of prosthetic heart valve: Secondary | ICD-10-CM | POA: Diagnosis not present

## 2021-08-13 DIAGNOSIS — C189 Malignant neoplasm of colon, unspecified: Secondary | ICD-10-CM | POA: Insufficient documentation

## 2021-08-13 DIAGNOSIS — C184 Malignant neoplasm of transverse colon: Secondary | ICD-10-CM

## 2021-08-13 DIAGNOSIS — N183 Chronic kidney disease, stage 3 unspecified: Secondary | ICD-10-CM | POA: Diagnosis not present

## 2021-08-13 DIAGNOSIS — Z01812 Encounter for preprocedural laboratory examination: Secondary | ICD-10-CM | POA: Insufficient documentation

## 2021-08-13 DIAGNOSIS — F17200 Nicotine dependence, unspecified, uncomplicated: Secondary | ICD-10-CM | POA: Insufficient documentation

## 2021-08-13 DIAGNOSIS — Z01818 Encounter for other preprocedural examination: Secondary | ICD-10-CM

## 2021-08-13 DIAGNOSIS — F1010 Alcohol abuse, uncomplicated: Secondary | ICD-10-CM | POA: Diagnosis not present

## 2021-08-13 DIAGNOSIS — I10 Essential (primary) hypertension: Secondary | ICD-10-CM

## 2021-08-13 LAB — COMPREHENSIVE METABOLIC PANEL
ALT: 20 U/L (ref 0–44)
AST: 24 U/L (ref 15–41)
Albumin: 4.1 g/dL (ref 3.5–5.0)
Alkaline Phosphatase: 145 U/L — ABNORMAL HIGH (ref 38–126)
Anion gap: 7 (ref 5–15)
BUN: 25 mg/dL — ABNORMAL HIGH (ref 8–23)
CO2: 23 mmol/L (ref 22–32)
Calcium: 9.9 mg/dL (ref 8.9–10.3)
Chloride: 108 mmol/L (ref 98–111)
Creatinine, Ser: 1.68 mg/dL — ABNORMAL HIGH (ref 0.61–1.24)
GFR, Estimated: 43 mL/min — ABNORMAL LOW (ref >60)
Glucose, Bld: 79 mg/dL (ref 70–99)
Potassium: 5.2 mmol/L — ABNORMAL HIGH (ref 3.5–5.1)
Sodium: 138 mmol/L (ref 135–145)
Total Bilirubin: 0.6 mg/dL (ref 0.3–1.2)
Total Protein: 8.8 g/dL — ABNORMAL HIGH (ref 6.5–8.1)

## 2021-08-13 LAB — CBC
HCT: 41.8 % (ref 39.0–52.0)
Hemoglobin: 13.8 g/dL (ref 13.0–17.0)
MCH: 30.5 pg (ref 26.0–34.0)
MCHC: 33 g/dL (ref 30.0–36.0)
MCV: 92.3 fL (ref 80.0–100.0)
Platelets: 206 10*3/uL (ref 150–400)
RBC: 4.53 MIL/uL (ref 4.22–5.81)
RDW: 18.8 % — ABNORMAL HIGH (ref 11.5–15.5)
WBC: 8.1 10*3/uL (ref 4.0–10.5)
nRBC: 0 % (ref 0.0–0.2)

## 2021-08-15 LAB — CEA: CEA: 11.2 ng/mL — ABNORMAL HIGH (ref 0.0–4.7)

## 2021-08-16 NOTE — Anesthesia Preprocedure Evaluation (Addendum)
Anesthesia Evaluation  Patient identified by MRN, date of birth, ID band Patient awake    Reviewed: Allergy & Precautions, NPO status , Patient's Chart, lab work & pertinent test results, reviewed documented beta blocker date and time   Airway Mallampati: I       Dental  (+) Edentulous Upper, Missing,    Pulmonary Current Smoker and Patient abstained from smoking.,    Pulmonary exam normal        Cardiovascular hypertension, Pt. on home beta blockers and Pt. on medications Normal cardiovascular exam+ dysrhythmias + pacemaker  Rhythm:Regular Rate:Normal + Systolic murmurs    Neuro/Psych    GI/Hepatic negative GI ROS, Neg liver ROS,   Endo/Other    Renal/GU Renal diseaseCKD III     Musculoskeletal   Abdominal Normal abdominal exam  (+)   Peds  Hematology   Anesthesia Other Findings Conclusion  Scheduled remote reviewed. Normal device function.   1 AMS, PAT, 8sec in duration  Next remote 91 days.  LA  1. Left ventricular ejection fraction, by estimation, is 60 to 65%. The  left ventricle has normal function. The left ventricle has no regional  wall motion abnormalities. There is mild left ventricular hypertrophy.  Left ventricular diastolic parameters  are indeterminate.  2. Right ventricular systolic function is normal. The right ventricular  size is not well visualized. There is mildly elevated pulmonary artery  systolic pressure.  3. Left atrial size was severely dilated.  4. Right atrial size was moderately dilated.  5. The mitral valve is grossly normal. Trivial mitral valve  regurgitation.  6. AI jet against the anterior mitral valve leaflet. Jet eccentric P1/2T  may underestimate. The aortic valve is calcified. Aortic valve  regurgitation is moderate.  7. The inferior vena cava is normal in size with greater than 50%  respiratory variability, suggesting right atrial pressure of 3 mmHg.    Reproductive/Obstetrics                          Anesthesia Physical Anesthesia Plan  ASA: 3  Anesthesia Plan: General   Post-op Pain Management: Ofirmev IV (intra-op)*   Induction: Intravenous  PONV Risk Score and Plan: 3 and Ondansetron, Dexamethasone and Midazolam  Airway Management Planned: Oral ETT  Additional Equipment: None  Intra-op Plan:   Post-operative Plan: Extubation in OR  Informed Consent: I have reviewed the patients History and Physical, chart, labs and discussed the procedure including the risks, benefits and alternatives for the proposed anesthesia with the patient or authorized representative who has indicated his/her understanding and acceptance.     Dental advisory given  Plan Discussed with: CRNA  Anesthesia Plan Comments: (See PAT note 08/13/2021)       Anesthesia Quick Evaluation

## 2021-08-16 NOTE — Progress Notes (Signed)
Anesthesia Chart Review   Case: [id number] Date/Time: 08/18/21 0715   Procedure: XI ROBOT ASSISTED LAPAROSCOPIC PARTIAL COLECTOMY   Anesthesia type: General   Pre-op diagnosis: colon cancer   [facility] ROOM 02 / [facility]   Surgeons: Romie Levee, MD       DISCUSSION:71 y.o. every day smoker with h/o HTN, CKD Stage III, pacemaker in place (dual chamber Jude/Abbott with last device check 08/09/21, device orders in 08/12/2021 progress note, magnet should be placed during procedure), s/p AAA repair 02/26/2021, colon cancer scheduled for above procedure 08/18/2021 with Dr. Romie Levee.   Pt reports daily alcohol, smokes cigarettes and marijuana.   Pt last seen by cardiology 07/12/2021. Per OV note, "Preoperative Cardiovascular Risk Assessment: He is doing well from a cardiac perspective and may proceed without further cardiac testing.   According to the Revised Cardiac Risk Index (RCRI), his Perioperative Risk of Major Cardiac Event is (%): 0.9. His Functional Capacity in METs is: 7.59 according to the [facility] Activity Status Index (DASI)."  Last seen by vascular surgeon 04/13/21, stable at this visit.   Anticipate pt can proceed with planned procedure barring acute status change.   VS: BP (!) 165/78   Pulse 68   Temp 36.6 C (Oral)   Resp 18   Ht 6' (1.829 m)   Wt 78.9 kg   SpO2 94%   BMI 23.60 kg/m   PROVIDERS: Leone Payor, FNP is PCP   Cardiologist -Dr. Sherald Hess LABS: Labs reviewed: Acceptable for surgery. (all labs ordered are listed, but only abnormal results are displayed)  Labs Reviewed  CBC - Abnormal; Notable for the following components:      Result Value   RDW 18.8 (*)    All other components within normal limits  COMPREHENSIVE METABOLIC PANEL - Abnormal; Notable for the following components:   Potassium 5.2 (*)    BUN 25 (*)    Creatinine, Ser 1.68 (*)    Total Protein 8.8 (*)    Alkaline Phosphatase 145 (*)    GFR, Estimated 43 (*)    All other components  within normal limits  CEA - Abnormal; Notable for the following components:   CEA 11.2 (*)    All other components within normal limits  TYPE AND SCREEN     IMAGES:   EKG: 03/25/21 Rate 61 bpm  Sinus Rhythm  LAFB/RBBB/PVC  CV: Echo 02/05/2021 1. Left ventricular ejection fraction, by estimation, is 60 to 65%. The  left ventricle has normal function. The left ventricle has no regional  wall motion abnormalities. There is mild left ventricular hypertrophy.  Left ventricular diastolic parameters  are indeterminate.   2. Right ventricular systolic function is normal. The right ventricular  size is not well visualized. There is mildly elevated pulmonary artery  systolic pressure.   3. Left atrial size was severely dilated.   4. Right atrial size was moderately dilated.   5. The mitral valve is grossly normal. Trivial mitral valve  regurgitation.   6. AI jet against the anterior mitral valve leaflet. Jet eccentric P1/2T  may underestimate. The aortic valve is calcified. Aortic valve  regurgitation is moderate.   7. The inferior vena cava is normal in size with greater than 50%  respiratory variability, suggesting right atrial pressure of 3 mmHg.  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 2272 dual chamber PPM   Tuberculosis    as a child, was treated    Past Surgical History:  Procedure Laterality Date   ABDOMINAL AORTIC ENDOVASCULAR STENT GRAFT Bilateral 02/26/2021   Procedure: ABDOMINAL AORTIC ENDOVASCULAR STENT GRAFT REPAIR;  Surgeon: Cephus Shelling, MD;  Location: [facility] OR;  Service: Vascular;  Laterality: Bilateral;   BIOPSY  03/12/2021   Procedure: BIOPSY;  Surgeon: Dolores Frame, MD;  [facility];  Service: Gastroenterology;;   BIOPSY  06/25/2021   Procedure: BIOPSY;  Surgeon: Dolores Frame, MD;  [facility];  Service: Gastroenterology;;  mass    COLONOSCOPY WITH PROPOFOL N/A 03/12/2021   Procedure: COLONOSCOPY WITH PROPOFOL;  Surgeon: Dolores Frame, MD;  [facility];  Service: Gastroenterology;  Laterality: N/A;  10:55   COLONOSCOPY WITH PROPOFOL N/A 06/25/2021   Procedure: COLONOSCOPY WITH PROPOFOL;  Surgeon: Dolores Frame, MD;  [facility];  Service: Gastroenterology;  Laterality: N/A;  235 ASA 2   HEMOSTASIS CLIP PLACEMENT  03/12/2021   Procedure: HEMOSTASIS CLIP PLACEMENT;  Surgeon: Dolores Frame, MD;  [facility];  Service: Gastroenterology;;   HEMOSTASIS CLIP PLACEMENT  06/25/2021   Procedure: HEMOSTASIS CLIP PLACEMENT;  Surgeon: Dolores Frame, MD;  [facility];  Service: Gastroenterology;;   HERNIA REPAIR Left 10/25/2006   PACEMAKER IMPLANT N/A 02/08/2021   Procedure: PACEMAKER IMPLANT;  Surgeon: Duke Salvia, MD;  Location: [facility] INVASIVE CV LAB;  Service: Cardiovascular;  Laterality: N/A;   POLYPECTOMY  03/12/2021   Procedure: POLYPECTOMY;  Surgeon: Marguerita Merles, Reuel Boom, MD;  [facility];  Service: Gastroenterology;;   POLYPECTOMY  06/25/2021   Procedure: POLYPECTOMY;  Surgeon: Marguerita Merles, Reuel Boom, MD;  [facility];  Service: Gastroenterology;;   Susa Day  03/12/2021   Procedure: Susa Day;  Surgeon: Marguerita Merles, Reuel Boom, MD;  [facility];  Service: Gastroenterology;;    MEDICATIONS:  albuterol (VENTOLIN HFA) 108 (90 Base) MCG/ACT inhaler   amLODipine (NORVASC) 10 MG tablet   aspirin EC 81 MG EC tablet   atorvastatin (LIPITOR) 40 MG tablet   bisacodyl (DULCOLAX) 5 MG EC tablet   carvedilol (COREG) 6.25 MG tablet   diphenhydrAMINE (BENADRYL) 25 MG tablet    famotidine (PEPCID) 20 MG tablet   Multiple Vitamins-Minerals (MULTIVITAMIN WITH MINERALS) tablet   nicotine (NICODERM CQ - DOSED IN MG/24 HOURS) 21 mg/24hr patch   simvastatin (ZOCOR) 20 MG tablet   No current facility-administered medications for this encounter.    Jodell Cipro Ward, PA-C [facility] Pre-Surgical Testing [phone]

## 2021-08-18 ENCOUNTER — Other Ambulatory Visit: Payer: Self-pay

## 2021-08-18 ENCOUNTER — Encounter (HOSPITAL_COMMUNITY)
Admission: RE | Disposition: A | Discharge status: Home / Self Care | Payer: Self-pay | Source: Home / Self Care | Attending: General Surgery

## 2021-08-18 ENCOUNTER — Encounter (HOSPITAL_COMMUNITY): Payer: Self-pay | Admitting: General Surgery

## 2021-08-18 ENCOUNTER — Inpatient Hospital Stay (HOSPITAL_COMMUNITY)
Admission: RE | Admit: 2021-08-18 | Discharge: 2021-08-24 | DRG: 330 | Disposition: A | Discharge status: Home / Self Care | Payer: Medicare Other | Attending: General Surgery | Admitting: General Surgery

## 2021-08-18 ENCOUNTER — Inpatient Hospital Stay (HOSPITAL_COMMUNITY): Payer: Medicare Other | Admitting: Physician Assistant

## 2021-08-18 ENCOUNTER — Inpatient Hospital Stay (HOSPITAL_COMMUNITY): Payer: Medicare Other | Admitting: Certified Registered Nurse Anesthetist

## 2021-08-18 DIAGNOSIS — I129 Hypertensive chronic kidney disease with stage 1 through stage 4 chronic kidney disease, or unspecified chronic kidney disease: Secondary | ICD-10-CM | POA: Diagnosis not present

## 2021-08-18 DIAGNOSIS — E7800 Pure hypercholesterolemia, unspecified: Secondary | ICD-10-CM | POA: Diagnosis not present

## 2021-08-18 DIAGNOSIS — Z79899 Other long term (current) drug therapy: Secondary | ICD-10-CM | POA: Diagnosis not present

## 2021-08-18 DIAGNOSIS — Z8249 Family history of ischemic heart disease and other diseases of the circulatory system: Secondary | ICD-10-CM

## 2021-08-18 DIAGNOSIS — K567 Ileus, unspecified: Secondary | ICD-10-CM | POA: Diagnosis not present

## 2021-08-18 DIAGNOSIS — N183 Chronic kidney disease, stage 3 unspecified: Secondary | ICD-10-CM

## 2021-08-18 DIAGNOSIS — J45909 Unspecified asthma, uncomplicated: Secondary | ICD-10-CM | POA: Diagnosis not present

## 2021-08-18 DIAGNOSIS — I10 Essential (primary) hypertension: Secondary | ICD-10-CM | POA: Diagnosis present

## 2021-08-18 DIAGNOSIS — Z01818 Encounter for other preprocedural examination: Secondary | ICD-10-CM

## 2021-08-18 DIAGNOSIS — Z800 Family history of malignant neoplasm of digestive organs: Secondary | ICD-10-CM | POA: Diagnosis not present

## 2021-08-18 DIAGNOSIS — C189 Malignant neoplasm of colon, unspecified: Secondary | ICD-10-CM

## 2021-08-18 DIAGNOSIS — Z87891 Personal history of nicotine dependence: Secondary | ICD-10-CM

## 2021-08-18 DIAGNOSIS — C183 Malignant neoplasm of hepatic flexure: Secondary | ICD-10-CM | POA: Diagnosis not present

## 2021-08-18 DIAGNOSIS — Z950 Presence of cardiac pacemaker: Secondary | ICD-10-CM | POA: Diagnosis not present

## 2021-08-18 DIAGNOSIS — Z7982 Long term (current) use of aspirin: Secondary | ICD-10-CM

## 2021-08-18 DIAGNOSIS — Z886 Allergy status to analgesic agent status: Secondary | ICD-10-CM

## 2021-08-18 DIAGNOSIS — Z9109 Other allergy status, other than to drugs and biological substances: Secondary | ICD-10-CM

## 2021-08-18 DIAGNOSIS — C772 Secondary and unspecified malignant neoplasm of intra-abdominal lymph nodes: Secondary | ICD-10-CM | POA: Diagnosis not present

## 2021-08-18 DIAGNOSIS — F1010 Alcohol abuse, uncomplicated: Principal | ICD-10-CM

## 2021-08-18 DIAGNOSIS — F17200 Nicotine dependence, unspecified, uncomplicated: Secondary | ICD-10-CM | POA: Diagnosis not present

## 2021-08-18 DIAGNOSIS — Z85038 Personal history of other malignant neoplasm of large intestine: Secondary | ICD-10-CM

## 2021-08-18 DIAGNOSIS — I351 Nonrheumatic aortic (valve) insufficiency: Secondary | ICD-10-CM

## 2021-08-18 DIAGNOSIS — I442 Atrioventricular block, complete: Secondary | ICD-10-CM | POA: Diagnosis not present

## 2021-08-18 DIAGNOSIS — Z01810 Encounter for preprocedural cardiovascular examination: Secondary | ICD-10-CM | POA: Diagnosis not present

## 2021-08-18 DIAGNOSIS — Z823 Family history of stroke: Secondary | ICD-10-CM

## 2021-08-18 DIAGNOSIS — Z720 Tobacco use: Secondary | ICD-10-CM | POA: Diagnosis present

## 2021-08-18 DIAGNOSIS — F17210 Nicotine dependence, cigarettes, uncomplicated: Secondary | ICD-10-CM

## 2021-08-18 DIAGNOSIS — C182 Malignant neoplasm of ascending colon: Secondary | ICD-10-CM | POA: Diagnosis not present

## 2021-08-18 LAB — TYPE AND SCREEN
ABO/RH(D): O POS
Antibody Screen: NEGATIVE

## 2021-08-18 SURGERY — COLECTOMY, PARTIAL, ROBOT-ASSISTED, LAPAROSCOPIC
Anesthesia: General | Site: Abdomen

## 2021-08-18 MED ORDER — ENSURE PRE-SURGERY PO LIQD
296.0000 mL | Freq: Once | ORAL | Status: DC
  Filled 2021-08-18: qty 296

## 2021-08-18 MED ORDER — ACETAMINOPHEN 10 MG/ML IV SOLN: 1000.0000 mg | Freq: Once | INTRAVENOUS | Status: DC | PRN

## 2021-08-18 MED ORDER — SUGAMMADEX SODIUM 200 MG/2ML IV SOLN
INTRAVENOUS | Status: DC | PRN
  Administered 2021-08-18: 200 mg via INTRAVENOUS

## 2021-08-18 MED ORDER — ENOXAPARIN SODIUM 40 MG/0.4ML IJ SOSY
40.0000 mg | PREFILLED_SYRINGE | INTRAMUSCULAR | Status: DC
  Administered 2021-08-19 – 2021-08-24 (×6): 40 mg via SUBCUTANEOUS
  Filled 2021-08-18 (×6): qty 0.4

## 2021-08-18 MED ORDER — KETAMINE HCL 10 MG/ML IJ SOLN
INTRAMUSCULAR | Status: AC
  Filled 2021-08-18: qty 1

## 2021-08-18 MED ORDER — BUPIVACAINE LIPOSOME 1.3 % IJ SUSP
INTRAMUSCULAR | Status: AC
  Filled 2021-08-18: qty 20

## 2021-08-18 MED ORDER — LIDOCAINE 2% (20 MG/ML) 5 ML SYRINGE
INTRAMUSCULAR | Status: DC | PRN
  Administered 2021-08-18: 80 mg via INTRAVENOUS
  Administered 2021-08-18: 1.5 mg/kg/h via INTRAVENOUS

## 2021-08-18 MED ORDER — ONDANSETRON HCL 4 MG/2ML IJ SOLN: 4.0000 mg | Freq: Once | INTRAMUSCULAR | Status: DC | PRN

## 2021-08-18 MED ORDER — ORAL CARE MOUTH RINSE: 15.0000 mL | Freq: Once | OROMUCOSAL | Status: AC

## 2021-08-18 MED ORDER — ACETAMINOPHEN 500 MG PO TABS
1000.0000 mg | ORAL_TABLET | Freq: Four times a day (QID) | ORAL | Status: DC
  Administered 2021-08-18 – 2021-08-24 (×20): 1000 mg via ORAL
  Filled 2021-08-18 (×23): qty 2

## 2021-08-18 MED ORDER — PROPOFOL 10 MG/ML IV BOLUS
INTRAVENOUS | Status: AC
  Filled 2021-08-18: qty 20

## 2021-08-18 MED ORDER — OXYCODONE HCL 5 MG PO TABS
5.0000 mg | ORAL_TABLET | ORAL | Status: DC | PRN
  Administered 2021-08-18 – 2021-08-20 (×6): 5 mg via ORAL
  Filled 2021-08-18 (×7): qty 1

## 2021-08-18 MED ORDER — ONDANSETRON HCL 4 MG/2ML IJ SOLN
INTRAMUSCULAR | Status: DC | PRN
  Administered 2021-08-18: 4 mg via INTRAVENOUS

## 2021-08-18 MED ORDER — ALBUTEROL SULFATE (2.5 MG/3ML) 0.083% IN NEBU
3.0000 mL | INHALATION_SOLUTION | Freq: Four times a day (QID) | RESPIRATORY_TRACT | Status: DC | PRN
Start: 2021-08-18 — End: 2021-08-24

## 2021-08-18 MED ORDER — ALVIMOPAN 12 MG PO CAPS: 12.0000 mg | ORAL_CAPSULE | Freq: Two times a day (BID) | ORAL | Status: DC

## 2021-08-18 MED ORDER — ONDANSETRON HCL 4 MG PO TABS: 4.0000 mg | ORAL_TABLET | Freq: Four times a day (QID) | ORAL | Status: DC | PRN

## 2021-08-18 MED ORDER — DIPHENHYDRAMINE HCL 12.5 MG/5ML PO ELIX: 12.5000 mg | ORAL_SOLUTION | Freq: Four times a day (QID) | ORAL | Status: DC | PRN

## 2021-08-18 MED ORDER — FENTANYL CITRATE (PF) 100 MCG/2ML IJ SOLN
INTRAMUSCULAR | Status: DC | PRN
Start: 2021-08-18 — End: 2021-08-18
  Administered 2021-08-18: 100 ug via INTRAVENOUS

## 2021-08-18 MED ORDER — SIMETHICONE 80 MG PO CHEW
40.0000 mg | CHEWABLE_TABLET | Freq: Four times a day (QID) | ORAL | Status: DC | PRN
  Administered 2021-08-20: 40 mg via ORAL
  Filled 2021-08-18: qty 1

## 2021-08-18 MED ORDER — DIPHENHYDRAMINE HCL 50 MG/ML IJ SOLN: 12.5000 mg | Freq: Four times a day (QID) | INTRAMUSCULAR | Status: DC | PRN

## 2021-08-18 MED ORDER — BUPIVACAINE LIPOSOME 1.3 % IJ SUSP: 20.0000 mL | Freq: Once | INTRAMUSCULAR | Status: DC

## 2021-08-18 MED ORDER — BUPIVACAINE-EPINEPHRINE 0.25% -1:200000 IJ SOLN
INTRAMUSCULAR | Status: DC | PRN
  Administered 2021-08-18: 30 mL

## 2021-08-18 MED ORDER — ROCURONIUM BROMIDE 10 MG/ML (PF) SYRINGE
PREFILLED_SYRINGE | INTRAVENOUS | Status: AC
  Filled 2021-08-18: qty 30

## 2021-08-18 MED ORDER — ROCURONIUM BROMIDE 10 MG/ML (PF) SYRINGE
PREFILLED_SYRINGE | INTRAVENOUS | Status: DC | PRN
  Administered 2021-08-18: 80 mg via INTRAVENOUS

## 2021-08-18 MED ORDER — GABAPENTIN 300 MG PO CAPS
300.0000 mg | ORAL_CAPSULE | ORAL | Status: AC
  Administered 2021-08-18: 300 mg via ORAL
  Filled 2021-08-18: qty 1

## 2021-08-18 MED ORDER — LIDOCAINE HCL 2 % IJ SOLN
INTRAMUSCULAR | Status: AC
  Filled 2021-08-18: qty 20

## 2021-08-18 MED ORDER — ENSURE PRE-SURGERY PO LIQD
592.0000 mL | Freq: Once | ORAL | Status: DC
  Filled 2021-08-18: qty 592

## 2021-08-18 MED ORDER — BISACODYL 5 MG PO TBEC: 20.0000 mg | DELAYED_RELEASE_TABLET | Freq: Once | ORAL | Status: DC

## 2021-08-18 MED ORDER — LACTATED RINGERS IV SOLN: INTRAVENOUS | Status: DC | PRN

## 2021-08-18 MED ORDER — ACETAMINOPHEN 500 MG PO TABS
1000.0000 mg | ORAL_TABLET | ORAL | Status: AC
  Administered 2021-08-18: 1000 mg via ORAL
  Filled 2021-08-18: qty 2

## 2021-08-18 MED ORDER — CARVEDILOL 6.25 MG PO TABS
6.2500 mg | ORAL_TABLET | Freq: Two times a day (BID) | ORAL | Status: DC
  Administered 2021-08-18 – 2021-08-24 (×12): 6.25 mg via ORAL
  Filled 2021-08-18 (×13): qty 1

## 2021-08-18 MED ORDER — PROPOFOL 10 MG/ML IV BOLUS
INTRAVENOUS | Status: DC | PRN
  Administered 2021-08-18: 140 mg via INTRAVENOUS

## 2021-08-18 MED ORDER — HYDROMORPHONE HCL 1 MG/ML IJ SOLN
0.5000 mg | INTRAMUSCULAR | Status: DC | PRN
  Administered 2021-08-20: 0.5 mg via INTRAVENOUS
  Filled 2021-08-18 (×2): qty 0.5

## 2021-08-18 MED ORDER — BUPIVACAINE LIPOSOME 1.3 % IJ SUSP
INTRAMUSCULAR | Status: DC | PRN
  Administered 2021-08-18: 20 mL

## 2021-08-18 MED ORDER — ENSURE SURGERY PO LIQD
237.0000 mL | Freq: Two times a day (BID) | ORAL | Status: DC
  Administered 2021-08-18 – 2021-08-23 (×9): 237 mL via ORAL

## 2021-08-18 MED ORDER — DEXAMETHASONE SODIUM PHOSPHATE 10 MG/ML IJ SOLN
INTRAMUSCULAR | Status: DC | PRN
  Administered 2021-08-18: 10 mg via INTRAVENOUS

## 2021-08-18 MED ORDER — PHENYLEPHRINE 80 MCG/ML (10ML) SYRINGE FOR IV PUSH (FOR BLOOD PRESSURE SUPPORT)
PREFILLED_SYRINGE | INTRAVENOUS | Status: DC | PRN
  Administered 2021-08-18 (×4): 80 ug via INTRAVENOUS

## 2021-08-18 MED ORDER — LACTATED RINGERS IR SOLN
Status: DC | PRN
  Administered 2021-08-18: 1000 mL

## 2021-08-18 MED ORDER — SODIUM CHLORIDE 0.9 % IV SOLN
2.0000 g | INTRAVENOUS | Status: AC
  Administered 2021-08-18: 2 g via INTRAVENOUS
  Filled 2021-08-18: qty 2

## 2021-08-18 MED ORDER — HYDROMORPHONE HCL 1 MG/ML IJ SOLN: 0.2500 mg | INTRAMUSCULAR | Status: DC | PRN

## 2021-08-18 MED ORDER — BUPIVACAINE-EPINEPHRINE 0.25% -1:200000 IJ SOLN
INTRAMUSCULAR | Status: AC
  Filled 2021-08-18: qty 1

## 2021-08-18 MED ORDER — NICOTINE 21 MG/24HR TD PT24
21.0000 mg | MEDICATED_PATCH | Freq: Every day | TRANSDERMAL | Status: DC | PRN
Start: 2021-08-18 — End: 2021-08-24
  Administered 2021-08-18 – 2021-08-19 (×2): 21 mg via TRANSDERMAL
  Filled 2021-08-18 (×2): qty 1

## 2021-08-18 MED ORDER — POLYETHYLENE GLYCOL 3350 17 GM/SCOOP PO POWD: 1.0000 | Freq: Once | ORAL | Status: DC

## 2021-08-18 MED ORDER — CHLORHEXIDINE GLUCONATE 0.12 % MT SOLN
15.0000 mL | Freq: Once | OROMUCOSAL | Status: AC
  Administered 2021-08-18: 15 mL via OROMUCOSAL

## 2021-08-18 MED ORDER — ONDANSETRON HCL 4 MG/2ML IJ SOLN
4.0000 mg | Freq: Four times a day (QID) | INTRAMUSCULAR | Status: DC | PRN
  Administered 2021-08-20 (×2): 4 mg via INTRAVENOUS
  Filled 2021-08-18 (×3): qty 2

## 2021-08-18 MED ORDER — SPY AGENT GREEN - (INDOCYANINE FOR INJECTION)
INTRAMUSCULAR | Status: DC | PRN
  Administered 2021-08-18: 1.75 mL via INTRAVENOUS

## 2021-08-18 MED ORDER — SODIUM CHLORIDE 0.9 % IV SOLN
2.0000 g | Freq: Two times a day (BID) | INTRAVENOUS | Status: AC
  Administered 2021-08-18: 2 g via INTRAVENOUS
  Filled 2021-08-18: qty 2

## 2021-08-18 MED ORDER — MEPERIDINE HCL 50 MG/ML IJ SOLN: 6.2500 mg | INTRAMUSCULAR | Status: DC | PRN

## 2021-08-18 MED ORDER — KCL IN DEXTROSE-NACL 20-5-0.45 MEQ/L-%-% IV SOLN
INTRAVENOUS | Status: DC
  Filled 2021-08-18 (×4): qty 1000

## 2021-08-18 MED ORDER — KETAMINE HCL 10 MG/ML IJ SOLN
INTRAMUSCULAR | Status: DC | PRN
  Administered 2021-08-18: 30 mg via INTRAVENOUS

## 2021-08-18 MED ORDER — ALVIMOPAN 12 MG PO CAPS
12.0000 mg | ORAL_CAPSULE | ORAL | Status: AC
  Administered 2021-08-18: 12 mg via ORAL
  Filled 2021-08-18: qty 1

## 2021-08-18 MED ORDER — ALUM & MAG HYDROXIDE-SIMETH 200-200-20 MG/5ML PO SUSP
30.0000 mL | Freq: Four times a day (QID) | ORAL | Status: DC | PRN
Start: 2021-08-18 — End: 2021-08-24

## 2021-08-18 MED ORDER — FENTANYL CITRATE (PF) 250 MCG/5ML IJ SOLN
INTRAMUSCULAR | Status: AC
  Filled 2021-08-18: qty 5

## 2021-08-18 MED ORDER — AMLODIPINE BESYLATE 10 MG PO TABS
10.0000 mg | ORAL_TABLET | Freq: Every day | ORAL | Status: DC
  Administered 2021-08-19 – 2021-08-24 (×6): 10 mg via ORAL
  Filled 2021-08-18 (×6): qty 1

## 2021-08-18 MED ORDER — SACCHAROMYCES BOULARDII 250 MG PO CAPS
250.0000 mg | ORAL_CAPSULE | Freq: Two times a day (BID) | ORAL | Status: DC
  Administered 2021-08-18 – 2021-08-20 (×5): 250 mg via ORAL
  Filled 2021-08-18 (×6): qty 1

## 2021-08-18 MED ORDER — HEPARIN SODIUM (PORCINE) 5000 UNIT/ML IJ SOLN
5000.0000 [IU] | Freq: Once | INTRAMUSCULAR | Status: AC
  Administered 2021-08-18: 5000 [IU] via SUBCUTANEOUS
  Filled 2021-08-18: qty 1

## 2021-08-18 MED ORDER — GABAPENTIN 300 MG PO CAPS
300.0000 mg | ORAL_CAPSULE | Freq: Two times a day (BID) | ORAL | Status: DC
  Administered 2021-08-18 – 2021-08-20 (×5): 300 mg via ORAL
  Filled 2021-08-18 (×5): qty 1

## 2021-08-18 MED ORDER — LACTATED RINGERS IV SOLN: INTRAVENOUS | Status: DC

## 2021-08-18 MED ORDER — 0.9 % SODIUM CHLORIDE (POUR BTL) OPTIME
TOPICAL | Status: DC | PRN
  Administered 2021-08-18: 1000 mL

## 2021-08-18 SURGICAL SUPPLY — 103 items
ADH SKN CLS APL DERMABOND .7 (GAUZE/BANDAGES/DRESSINGS) ×1
BAG COUNTER SPONGE SURGICOUNT (BAG) ×3 IMPLANT
BAG SPNG CNTER NS LX DISP (BAG) ×1
BLADE EXTENDED COATED 6.5IN (ELECTRODE) IMPLANT
CANNULA REDUC XI 12-8 STAPL (CANNULA)
CANNULA REDUCER 12-8 DVNC XI (CANNULA) IMPLANT
CELLS DAT CNTRL 66122 CELL SVR (MISCELLANEOUS) IMPLANT
COVER SURGICAL LIGHT HANDLE (MISCELLANEOUS) ×6 IMPLANT
COVER TIP SHEARS 8 DVNC (MISCELLANEOUS) ×2 IMPLANT
COVER TIP SHEARS 8MM DA VINCI (MISCELLANEOUS) ×2
DERMABOND ADVANCED (GAUZE/BANDAGES/DRESSINGS) ×1
DERMABOND ADVANCED .7 DNX12 (GAUZE/BANDAGES/DRESSINGS) IMPLANT
DRAIN CHANNEL 19F RND (DRAIN) IMPLANT
DRAPE ARM DVNC X/XI (DISPOSABLE) ×8 IMPLANT
DRAPE COLUMN DVNC XI (DISPOSABLE) ×2 IMPLANT
DRAPE DA VINCI XI ARM (DISPOSABLE) ×8
DRAPE DA VINCI XI COLUMN (DISPOSABLE) ×2
DRAPE SURG IRRIG POUCH 19X23 (DRAPES) ×3 IMPLANT
DRSG OPSITE POSTOP 4X10 (GAUZE/BANDAGES/DRESSINGS) IMPLANT
DRSG OPSITE POSTOP 4X6 (GAUZE/BANDAGES/DRESSINGS) IMPLANT
DRSG OPSITE POSTOP 4X8 (GAUZE/BANDAGES/DRESSINGS) IMPLANT
ELECT PENCIL ROCKER SW 15FT (MISCELLANEOUS) ×3 IMPLANT
ELECT REM PT RETURN 15FT ADLT (MISCELLANEOUS) ×3 IMPLANT
ENDOLOOP SUT PDS II  0 18 (SUTURE)
ENDOLOOP SUT PDS II 0 18 (SUTURE) IMPLANT
EVACUATOR SILICONE 100CC (DRAIN) IMPLANT
GLOVE BIO SURGEON STRL SZ 6.5 (GLOVE) ×9 IMPLANT
GLOVE BIOGEL PI IND STRL 7.0 (GLOVE) ×4 IMPLANT
GLOVE BIOGEL PI INDICATOR 7.0 (GLOVE) ×2
GLOVE INDICATOR 6.5 STRL GRN (GLOVE) ×3 IMPLANT
GOWN SRG XL LVL 4 BRTHBL STRL (GOWNS) ×2 IMPLANT
GOWN STRL NON-REIN XL LVL4 (GOWNS) ×2
GOWN STRL REUS W/ TWL XL LVL3 (GOWN DISPOSABLE) ×6 IMPLANT
GOWN STRL REUS W/TWL XL LVL3 (GOWN DISPOSABLE) ×6
GRASPER SUT TROCAR 14GX15 (MISCELLANEOUS) IMPLANT
HOLDER FOLEY CATH W/STRAP (MISCELLANEOUS) ×3 IMPLANT
IRRIG SUCT STRYKERFLOW 2 WTIP (MISCELLANEOUS) ×2
IRRIGATION SUCT STRKRFLW 2 WTP (MISCELLANEOUS) ×2 IMPLANT
KIT PROCEDURE DA VINCI SI (MISCELLANEOUS)
KIT PROCEDURE DVNC SI (MISCELLANEOUS) IMPLANT
KIT TURNOVER KIT A (KITS) ×1 IMPLANT
NDL INSUFFLATION 14GA 120MM (NEEDLE) ×2 IMPLANT
NEEDLE INSUFFLATION 14GA 120MM (NEEDLE) ×2 IMPLANT
PACK CARDIOVASCULAR III (CUSTOM PROCEDURE TRAY) ×3 IMPLANT
PACK COLON (CUSTOM PROCEDURE TRAY) ×3 IMPLANT
PAD POSITIONING PINK XL (MISCELLANEOUS) ×3 IMPLANT
RELOAD STAPLE 60 2.5 WHT DVNC (STAPLE) IMPLANT
RELOAD STAPLE 60 3.5 BLU DVNC (STAPLE) IMPLANT
RELOAD STAPLE 60 4.3 GRN DVNC (STAPLE) IMPLANT
RELOAD STAPLER 2.5X60 WHT DVNC (STAPLE) ×1 IMPLANT
RELOAD STAPLER 3.5X60 BLU DVNC (STAPLE) ×1 IMPLANT
RELOAD STAPLER 4.3X60 GRN DVNC (STAPLE) ×1 IMPLANT
RETRACTOR WND ALEXIS 18 MED (MISCELLANEOUS) IMPLANT
RTRCTR WOUND ALEXIS 18CM MED (MISCELLANEOUS)
SCISSORS LAP 5X35 DISP (ENDOMECHANICALS) ×1 IMPLANT
SEAL CANN UNIV 5-8 DVNC XI (MISCELLANEOUS) ×6 IMPLANT
SEAL XI 5MM-8MM UNIVERSAL (MISCELLANEOUS) ×6
SEALER VESSEL DA VINCI XI (MISCELLANEOUS) ×2
SEALER VESSEL EXT DVNC XI (MISCELLANEOUS) ×2 IMPLANT
SOLUTION ELECTROLUBE (MISCELLANEOUS) ×3 IMPLANT
SPIKE FLUID TRANSFER (MISCELLANEOUS) IMPLANT
STAPLER 60 DA VINCI SURE FORM (STAPLE) ×2
STAPLER 60 SUREFORM DVNC (STAPLE) IMPLANT
STAPLER CANNULA SEAL DVNC XI (STAPLE) IMPLANT
STAPLER CANNULA SEAL XI (STAPLE)
STAPLER ECHELON POWER CIR 29 (STAPLE) IMPLANT
STAPLER ECHELON POWER CIR 31 (STAPLE) IMPLANT
STAPLER RELOAD 2.5X60 WHITE (STAPLE) ×2
STAPLER RELOAD 2.5X60 WHT DVNC (STAPLE) ×1
STAPLER RELOAD 3.5X60 BLU DVNC (STAPLE) ×1
STAPLER RELOAD 3.5X60 BLUE (STAPLE) ×2
STAPLER RELOAD 4.3X60 GREEN (STAPLE) ×2
STAPLER RELOAD 4.3X60 GRN DVNC (STAPLE) ×1
STOPCOCK 4 WAY LG BORE MALE ST (IV SETS) ×6 IMPLANT
SUT ETHILON 2 0 PS N (SUTURE) IMPLANT
SUT NOVA NAB DX-16 0-1 5-0 T12 (SUTURE) ×6 IMPLANT
SUT NOVA NAB GS-21 1 T12 (SUTURE) ×2 IMPLANT
SUT PROLENE 2 0 KS (SUTURE) IMPLANT
SUT SILK 2 0 (SUTURE) ×2
SUT SILK 2 0 SH CR/8 (SUTURE) IMPLANT
SUT SILK 2-0 18XBRD TIE 12 (SUTURE) ×2 IMPLANT
SUT SILK 3 0 (SUTURE)
SUT SILK 3 0 SH CR/8 (SUTURE) ×3 IMPLANT
SUT SILK 3-0 18XBRD TIE 12 (SUTURE) IMPLANT
SUT V-LOC BARB 180 2/0GR6 GS22 (SUTURE)
SUT VIC AB 2-0 SH 18 (SUTURE) IMPLANT
SUT VIC AB 2-0 SH 27 (SUTURE)
SUT VIC AB 2-0 SH 27X BRD (SUTURE) IMPLANT
SUT VIC AB 3-0 SH 18 (SUTURE) IMPLANT
SUT VIC AB 4-0 PS2 27 (SUTURE) ×5 IMPLANT
SUT VICRYL 0 UR6 27IN ABS (SUTURE) ×3 IMPLANT
SUTURE V-LC BRB 180 2/0GR6GS22 (SUTURE) IMPLANT
SYR 10ML ECCENTRIC (SYRINGE) ×3 IMPLANT
SYS LAPSCP GELPORT 120MM (MISCELLANEOUS)
SYS WOUND ALEXIS 18CM MED (MISCELLANEOUS)
SYSTEM LAPSCP GELPORT 120MM (MISCELLANEOUS) IMPLANT
SYSTEM WOUND ALEXIS 18CM MED (MISCELLANEOUS) IMPLANT
TOWEL OR 17X26 10 PK STRL BLUE (TOWEL DISPOSABLE) IMPLANT
TOWEL OR NON WOVEN STRL DISP B (DISPOSABLE) ×3 IMPLANT
TRAY FOLEY MTR SLVR 16FR STAT (SET/KITS/TRAYS/PACK) ×3 IMPLANT
TROCAR ADV FIXATION 5X100MM (TROCAR) ×3 IMPLANT
TUBING CONNECTING 10 (TUBING) ×7 IMPLANT
TUBING INSUFFLATION 10FT LAP (TUBING) ×3 IMPLANT

## 2021-08-18 NOTE — Op Note (Signed)
08/18/2021  9:50 AM  PATIENT:  Fernando Dean  71 y.o. male  Patient Care Team: [name], FNP as PCP - General (Family Medicine) Satira Sark, MD as PCP - Cardiology (Cardiology) Derek Jack, MD as Medical Oncologist (Medical Oncology) Brien Mates, RN as Oncology Nurse Navigator (Medical Oncology)  PRE-OPERATIVE DIAGNOSIS:  colon cancer  POST-OPERATIVE DIAGNOSIS:  colon cancer  PROCEDURE:  XI ROBOT ASSISTED RIGHT COLECTOMY, ASSESSMENT OF TISSUE PERFUSION WITH INJECTION OF FIREFLY   Surgeon(s): Michael Boston, MD Leighton Ruff, MD  ASSISTANT: Dr Johney [place]   ANESTHESIA:   local and general  EBL: 25 ml Total I/O In: 100 [IV Piggyback:100] Out: 25 [Blood:25]  Delay start of Pharmacological VTE agent (>24hrs) due to surgical blood loss or risk of bleeding:  no  DRAINS: none   SPECIMEN:  Source of Specimen:  R colon  DISPOSITION OF SPECIMEN:  PATHOLOGY  COUNTS:  YES  PLAN OF CARE: Admit to inpatient   PATIENT DISPOSITION:  PACU - hemodynamically stable.  INDICATION:    71 y.o. M s/p sigmoid resection for obstructing colon cancer was determined to have a right sided mass on completion colonoscopy.  I recommended segmental resection:  The anatomy & physiology of the digestive tract was discussed.  The pathophysiology was discussed.  Natural history risks without surgery was discussed.   I worked to give an overview of the disease and the frequent need to have multispecialty involvement.  I feel the risks of no intervention will lead to serious problems that outweigh the operative risks; therefore, I recommended a partial colectomy to remove the pathology.  Laparoscopic & open techniques were discussed.   Risks such as bleeding, infection, abscess, leak, reoperation, possible ostomy, hernia, heart attack, death, and other risks were discussed.  I noted a good likelihood this will help address the problem.   Goals of post-operative recovery were discussed  as well.    The patient expressed understanding & wished to proceed with surgery.  OR FINDINGS:   Patient had mass and tattoo in the transverse colon just proximal to the hepatic flexure  No obvious metastatic disease on visceral parietal peritoneum or liver.  DESCRIPTION:   Informed consent was confirmed.  The patient underwent general anaesthesia without difficulty.  The patient was positioned appropriately.  VTE prevention in place.  The patient's abdomen was clipped, prepped, & draped in a sterile fashion.  Surgical timeout confirmed our plan.  The patient was positioned in reverse Trendelenburg.  Abdominal entry was gained using a Varies needle in the LUQ.  Entry was clean.  I induced carbon dioxide insufflation.  An 5m robotic port was placed in the LUQ.  Camera inspection revealed no injury.  Extra ports were carefully placed under direct laparoscopic visualization.  I laparoscopically reflected the greater omentum and the upper abdomen the small bowel in the peilvis. The patient was appropriately positioned and the robot was docked to the patient's right side.  Instruments were placed under direct visualization.  There were minimal adhesions noted in the abdomen from his previous surgery   I began by identifying the ileocolic artery and vein within the mesentery. Dissection was bluntly carried around these structures. The duodenum was identified and free from the structures. I then separated the structures bluntly and used the robotic vessel sealer device to transect these.  I developed the retroperitoneal plane bluntly.  I then freed the appendix off its attachments to the pelvic wall. I mobilized the terminal ileum.  I took care  to avoid injuring any retroperitoneal structures.  After this I began to mobilize laterally down the white line of [place] and then took down the hepatic flexure using the Enseal device. I mobilized the omentum off of the right transverse colon. The entire colon was  then flipped medially and mobilized off of the retroperitoneal structures until I could visualize the lateral edge of the duodenum underneath.  I gently freed the duodenal attachments.   I identified a portion of mesentery of the transverse colon distal to the right branch of the middle colic.  I divided up to the colon from the previous dissection of the mesentery using the robotic vessel sealer, including the right branch of the middle colic in my specimen side.  I then divided the terminal ileal mesentery in similar fashion.  At that point, the terminal ileum was divided with a blue load robotic 60 mm stapler.  The transverse colon was divided with a green load robotic stapler.  The specimen was then completely free and placed in the right upper quadrant.  Hemostasis was good.  I then oriented the remaining terminal ileum and transverse colon and a isoperistaltic fashion.  I placed an enterotomy in the small bowel and colon using the robotic scissors.  I then introduced a white load 60 mm robotic stapler into both enterotomies and created an anastomosis between the small bowel and transverse colon.  Hemostasis within the staple line was good.  The common enterotomy channel was closed using 2 running 2-0 V-Loc sutures.  The abdomen was then irrigated with normal saline. The omentum was then brought down over the anastomosis.  At this point the robot was undocked.  The 12 mm suprapubic port was enlarged to a Pfannenstiel incision and an [place] wound protector was placed.  The specimen was removed from the abdomen and evaluated.  Once the abdomen was inspected for hemostasis, the [place] wound protector was removed.    The peritoneum of the Pfannenstiel incision was closed using a running 0 Vicryl suture.  The fascia was then closed using #1 Novafil interrupted sutures.  The subcutaneous tissue of the extraction incision was closed using a running 2-0 Vicryl suture. The skin was then closed using running  subcuticular 4-0 Vicryl sutures.  A sterile dressing was applied.  The remaining port sites were closed using interrupted 4-0 Vicryl sutures and Dermabond. All counts were correct per operating room staff. The patient was then awakened from anesthesia and sent to the post anesthesia care unit in stable condition.    Rosario Adie, MD  Colorectal and [place] [facility]

## 2021-08-18 NOTE — Anesthesia Postprocedure Evaluation (Signed)
Anesthesia Post Note  Patient: Fernando Dean  Procedure(s) Performed: XI ROBOT ASSISTED RIGHT COLECTOMY, ASSESSMENT OF TISSUE PERFUSION WITH INJECTION OF FIREFLY (Abdomen)     Patient location during evaluation: PACU Anesthesia Type: General Level of consciousness: sedated Pain management: pain level controlled Vital Signs Assessment: post-procedure vital signs reviewed and stable Respiratory status: spontaneous breathing Cardiovascular status: stable Postop Assessment: no apparent nausea or vomiting Anesthetic complications: no   No notable events documented.  Last Vitals:  Vitals:   08/18/21 1030 08/18/21 1045  BP: (!) 148/72 (!) 144/71  Pulse: 60 60  Resp: 17 16  Temp:    SpO2: 97% 92%    Last Pain:  Vitals:   08/18/21 1030  TempSrc:   PainSc: 0-No pain                 John F Salome Arnt

## 2021-08-18 NOTE — Anesthesia Procedure Notes (Signed)
Procedure Name: Intubation Date/Time: 08/18/2021 8:19 AM  Performed by: Gean Maidens, CRNAPre-anesthesia Checklist: Patient identified, Emergency Drugs available, Suction available, Patient being monitored and Timeout performed Patient Re-evaluated:Patient Re-evaluated prior to induction Oxygen Delivery Method: Circle system utilized Preoxygenation: Pre-oxygenation with 100% oxygen Induction Type: IV induction Ventilation: Mask ventilation without difficulty Laryngoscope Size: Mac and 3 Grade View: Grade I Tube type: Oral Tube size: 7.5 mm Number of attempts: 1 Airway Equipment and Method: Stylet Placement Confirmation: ETT inserted through vocal cords under direct vision, positive ETCO2 and breath sounds checked- equal and bilateral Secured at: 23 cm Tube secured with: Tape Dental Injury: Teeth and Oropharynx as per pre-operative assessment

## 2021-08-18 NOTE — Transfer of Care (Signed)
Immediate Anesthesia Transfer of Care Note  Patient: Fernando Dean  Procedure(s) Performed: XI ROBOT ASSISTED RIGHT COLECTOMY, ASSESSMENT OF TISSUE PERFUSION WITH INJECTION OF FIREFLY (Abdomen)  Patient Location: PACU  Anesthesia Type:General  Level of Consciousness: sedated, patient cooperative and responds to stimulation  Airway & Oxygen Therapy: Patient Spontanous Breathing and Patient connected to face mask oxygen  Post-op Assessment: Report given to RN and Post -op Vital signs reviewed and stable  Post vital signs: Reviewed and stable  Last Vitals:  Vitals Value Taken Time  BP    Temp    Pulse    Resp    SpO2      Last Pain:  Vitals:   08/18/21 0615  TempSrc:   PainSc: 0-No pain         Complications: No notable events documented.

## 2021-08-18 NOTE — H&P (Signed)
REFERRING PHYSICIAN:  Dr Melida Quitter   PROVIDER:  Monico Blitz, MD   MRN: [id number] DOB: 1950/08/19    Subjective    Chief Complaint: Follow-up (Colon cancer)       History of Present Illness: Fernando Dean is a 71 y.o. male who is seen today as an office consultation at the request of Dr. Melida Quitter for evaluation of colon cancer.   .  Patient is status post robotic colonic resection of a distal transverse colon cancer in January 2023.  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  He then underwent a follow-up completion colonoscopy on Jun 25, 2021.  A mass was found in the proximal transverse colon.  Biopsy showed invasive adenocarcinoma.  Area distal to the mass was tattooed.  Patient is status post endovascular aortic aneurysm repair.       Review of Systems: A complete review of systems was obtained from the patient.  I have reviewed this information and discussed as appropriate with the patient.  See HPI as well for other ROS.       Medical History:     Past Medical History:  Diagnosis Date   Aneurysm (CMS-HCC)     Hypertension        There is no problem list on file for this patient.          Past Surgical History:  Procedure Laterality Date   COLON SURGERY       stint       TRANSCATHETER INSERTION/REPLACEMENT LEADLESS PACEMAKER              Allergies  Allergen Reactions   Ibuprofen Palpitations and Shortness Of Breath   Naproxen Shortness Of Breath   Nsaids (Non-Steroidal Anti-Inflammatory Drug) Shortness Of Breath   Lisinopril Unknown            Current Outpatient Medications on File Prior to Visit  Medication Sig Dispense Refill   albuterol 90 mcg/actuation inhaler albuterol sulfate HFA 90 mcg/actuation aerosol inhaler  INHALE 2 PUFFS BY MOUTH EVERY 8 HOURS AS NEEDED       amLODIPine (NORVASC) 10 MG tablet Take 1 tablet by mouth once daily       simvastatin (ZOCOR) 20 MG tablet  simvastatin 20 mg tablet  TAKE 1 TABLET BY MOUTH EVERY DAY AT BEDTIME       aspirin 81 MG EC tablet Take by mouth       atorvastatin (LIPITOR) 40 MG tablet atorvastatin 40 mg tablet       bisacodyL (DULCOLAX) 5 mg EC tablet Take 4 tablets (20 mg total) by mouth once daily as needed for Constipation for up to 1 dose 4 tablet 0   carvediloL (COREG) 6.25 MG tablet         multivitamin with minerals tablet Take by mouth       nicotine (NICODERM CQ) 21 mg/24 hr patch Place 1 patch onto the skin once daily        No current facility-administered medications on file prior to visit.           Family History  Problem Relation Age of Onset   Obesity Mother     High blood pressure (Hypertension) Mother     Colon cancer Mother     Stroke Father     High blood pressure (Hypertension) Father     Hyperlipidemia (Elevated cholesterol) Father     Coronary Artery Disease (Blocked arteries around heart) Father  High blood pressure (Hypertension) Sister     Obesity Sister        Social History        Tobacco Use  Smoking Status Former   Types: Cigarettes  Smokeless Tobacco Never      Social History         Socioeconomic History   Marital status: Single  Tobacco Use   Smoking status: Former      Types: Cigarettes   Smokeless tobacco: Never  Substance and Sexual Activity   Alcohol use: Yes   Drug use: Yes      Types: Marijuana      Objective:      Vitals:   08/18/21 0540  BP: (!) 155/81  Pulse: 64  Resp: 18  Temp: 98.5 F (36.9 C)  SpO2: 100%      Exam Gen: NAD Abd: soft, incisions healed CV: RRR Lungs: CTA     Labs, Imaging and Diagnostic Testing: Colonoscopy report and pathology reports reviewed.   CT angio of the abdomen and pelvis recently performed with no sign of metastatic disease.   Assessment and Plan:  Diagnoses and all orders for this visit:   Malignant neoplasm of transverse colon (CMS-HCC)     Patient with a recent history of left colon  resection due to adenocarcinoma now with a right-sided colon cancer in the proximal transverse colon.  This was tattooed distally.  I have recommended proceeding with a robotic assisted right colectomy.  We discussed a significant risk of chronic diarrhea after this procedure.  We discussed how diarrhea can lead to worsening kidney failure.  We discussed that he will need to either correct his diarrhea or increase his water consumption to avoid this.  We discussed that his risk for surgery would be similar to his previous colon surgery.  We discussed the need to reduce the amount of smoking he does around surgery or quit altogether to avoid postoperative complications is much as possible.  All questions were answered.  We will proceed with surgery as soon as possible.   The surgery and anatomy were described to the patient as well as the risks of surgery and the possible complications.  These include: Bleeding, deep abdominal infections and possible wound complications such as hernia and infection, damage to adjacent structures, leak of surgical connections, which can lead to other surgeries and possibly an ostomy, possible need for other procedures, such as abscess drains in radiology, possible prolonged hospital stay, possible diarrhea from removal of part of the colon, possible constipation from narcotics, possible bowel, bladder or sexual dysfunction if having rectal surgery, prolonged fatigue/weakness or appetite loss, possible early recurrence of of disease, possible complications of their medical problems such as heart disease or arrhythmias or lung problems, death (less than 1%). I believe the patient understands and wishes to proceed with the surgery.   No follow-ups on file.    Rosario Adie, MD Colon and Rectal Surgery [facility]

## 2021-08-19 LAB — BASIC METABOLIC PANEL
Anion gap: 8 (ref 5–15)
BUN: 23 mg/dL (ref 8–23)
CO2: 20 mmol/L — ABNORMAL LOW (ref 22–32)
Calcium: 9.2 mg/dL (ref 8.9–10.3)
Chloride: 109 mmol/L (ref 98–111)
Creatinine, Ser: 2.01 mg/dL — ABNORMAL HIGH (ref 0.61–1.24)
GFR, Estimated: 35 mL/min — ABNORMAL LOW (ref >60)
Glucose, Bld: 168 mg/dL — ABNORMAL HIGH (ref 70–99)
Potassium: 4.9 mmol/L (ref 3.5–5.1)
Sodium: 137 mmol/L (ref 135–145)

## 2021-08-19 LAB — CBC
HCT: 34.9 % — ABNORMAL LOW (ref 39.0–52.0)
Hemoglobin: 11.2 g/dL — ABNORMAL LOW (ref 13.0–17.0)
MCH: 30.2 pg (ref 26.0–34.0)
MCHC: 32.1 g/dL (ref 30.0–36.0)
MCV: 94.1 fL (ref 80.0–100.0)
Platelets: 176 10*3/uL (ref 150–400)
RBC: 3.71 MIL/uL — ABNORMAL LOW (ref 4.22–5.81)
RDW: 18.5 % — ABNORMAL HIGH (ref 11.5–15.5)
WBC: 16.1 10*3/uL — ABNORMAL HIGH (ref 4.0–10.5)
nRBC: 0 % (ref 0.0–0.2)

## 2021-08-19 NOTE — Progress Notes (Signed)
PHARMACIST - PHYSICIAN COMMUNICATION DR:   Marcello Moores CONCERNING: Alvimopan   RECOMMENDATION: This patient is receiving alvimopan post-operatively.  Based on criteria approved by the Pharmacy and Therapeutics Committee, the medication will be discontinued.  DESCRIPTION: These criteria include: Patient will receive NO MORE than 15 doses total during current hospitalization If bowel recovery (documented return of bowel sounds and a bowel movement confirmed by RN or patient) occurs before completion of 7 days of therapy, a pharmacist may discontinue alvimopan  If you have questions about this conversion, please contact the Pharmacy Department  '[]'$   [phone] )  [facility] '[]'$   [phone] )  [facility]  '[]'$   [phone] )  [facility] '[x]'$   [phone] )  [facility]   6/29: Patient with documented BM this AM. Entereg dose held this AM. Subsequent doses will be discontinued.  Dimple Nanas, PharmD 08/19/2021 10:32 AM

## 2021-08-19 NOTE — Progress Notes (Signed)
1 Day Post-Op Robotic R colectomy  Subjective: Having bowel function, tolerating liquids, pain ok, ambulated in hall, foley out and urinating well  Objective: Vital signs in last 24 hours: Temp:  [97.4 F (36.3 C)-98.6 F (37 C)] 97.4 F (36.3 C) (06/29 1300) Pulse Rate:  [66-87] 81 (06/29 1300) Resp:  [16-19] 18 (06/29 1300) BP: (138-154)/(72-89) 154/78 (06/29 1300) SpO2:  [96 %-100 %] 99 % (06/29 1300)   Intake/Output from previous day: 06/28 0701 - 06/29 0700 In: 3966 [P.O.:770; I.V.:3096; IV Piggyback:100] Out: 0354 [Urine:3150; Stool:2; Blood:100] Intake/Output this shift: Total I/O In: 480 [P.O.:480] Out: -    General appearance: alert and cooperative GI: normal findings: soft, nondistended  Incision: no significant drainage  Lab Results:  Recent Labs    08/19/21 0409  WBC 16.1*  HGB 11.2*  HCT 34.9*  PLT 176   BMET Recent Labs    08/19/21 0409  NA 137  K 4.9  CL 109  CO2 20*  GLUCOSE 168*  BUN 23  CREATININE 2.01*  CALCIUM 9.2   PT/INR No results for input(s): "LABPROT", "INR" in the last 72 hours. ABG No results for input(s): "PHART", "HCO3" in the last 72 hours.  Invalid input(s): "PCO2", "PO2"  MEDS, Scheduled  acetaminophen  1,000 mg Oral Q6H   amLODipine  10 mg Oral Daily   carvedilol  6.25 mg Oral BID WC   enoxaparin (LOVENOX) injection  40 mg Subcutaneous Q24H   feeding supplement  237 mL Oral BID BM   gabapentin  300 mg Oral BID   saccharomyces boulardii  250 mg Oral BID    Studies/Results: No results found.  Assessment: s/p Procedure(s): XI ROBOT ASSISTED RIGHT COLECTOMY, ASSESSMENT OF TISSUE PERFUSION WITH INJECTION OF FIREFLY Patient Active Problem List   Diagnosis Date Noted   Colon cancer ([place]) 08/18/2021   Abnormal PET scan of colon 06/21/2021   Family history of colon cancer 06/08/2021   Centrilobular emphysema ([place]) 05/18/2021   Microcytic anemia 05/18/2021   Cancer of left colon ([place]) 05/11/2021   Colonic mass  s/p robotic splenic flexure resection 04/23/2021 04/23/2021   S/P AAA repair 02/26/2021   AAA (abdominal aortic aneurysm) without rupture ([place]) 02/16/2021   Complete heart block ([place]) 02/04/2021   Alcohol abuse 02/04/2021   Tobacco abuse 02/04/2021    Class: Chronic   CKD (chronic kidney disease), stage III ([place]) 02/04/2021   Essential hypertension 02/01/2021   Encounter for abdominal aortic aneurysm (AAA) screening 02/01/2021    Expected post op course  Plan: Advance diet to soft foods Cont IVF's, recheck Cr in AM   LOS: 1 day     .Rosario Adie, [facility], [place]    08/19/2021 2:03 PM

## 2021-08-20 LAB — BASIC METABOLIC PANEL
Anion gap: 5 (ref 5–15)
BUN: 22 mg/dL (ref 8–23)
CO2: 22 mmol/L (ref 22–32)
Calcium: 10 mg/dL (ref 8.9–10.3)
Chloride: 112 mmol/L — ABNORMAL HIGH (ref 98–111)
Creatinine, Ser: 1.68 mg/dL — ABNORMAL HIGH (ref 0.61–1.24)
GFR, Estimated: 43 mL/min — ABNORMAL LOW (ref >60)
Glucose, Bld: 126 mg/dL — ABNORMAL HIGH (ref 70–99)
Potassium: 5.3 mmol/L — ABNORMAL HIGH (ref 3.5–5.1)
Sodium: 139 mmol/L (ref 135–145)

## 2021-08-20 LAB — CBC
HCT: 37.8 % — ABNORMAL LOW (ref 39.0–52.0)
Hemoglobin: 12.6 g/dL — ABNORMAL LOW (ref 13.0–17.0)
MCH: 31.5 pg (ref 26.0–34.0)
MCHC: 33.3 g/dL (ref 30.0–36.0)
MCV: 94.5 fL (ref 80.0–100.0)
Platelets: 208 10*3/uL (ref 150–400)
RBC: 4 MIL/uL — ABNORMAL LOW (ref 4.22–5.81)
RDW: 18.9 % — ABNORMAL HIGH (ref 11.5–15.5)
WBC: 13.6 10*3/uL — ABNORMAL HIGH (ref 4.0–10.5)
nRBC: 0 % (ref 0.0–0.2)

## 2021-08-20 LAB — SURGICAL PATHOLOGY

## 2021-08-20 MED ORDER — DEXTROSE-NACL 5-0.45 % IV SOLN: INTRAVENOUS | Status: DC

## 2021-08-20 MED ORDER — PROCHLORPERAZINE EDISYLATE 10 MG/2ML IJ SOLN
10.0000 mg | Freq: Four times a day (QID) | INTRAMUSCULAR | Status: DC | PRN
  Administered 2021-08-20: 10 mg via INTRAVENOUS
  Filled 2021-08-20: qty 2

## 2021-08-20 NOTE — Progress Notes (Signed)
2 Days Post-Op Robotic R colectomy  Subjective: Having bowel function with one episode of nausea, tolerating some solid foods, pain ok  Objective: Vital signs in last 24 hours: Temp:  [97.4 F (36.3 C)-98.9 F (37.2 C)] 98.2 F (36.8 C) (06/30 7412) Pulse Rate:  [66-81] 75 (06/30 0613) Resp:  [16-18] 16 (06/29 2207) BP: (139-154)/(77-88) 139/86 (06/30 0613) SpO2:  [94 %-100 %] 100 % (06/30 8786)   Intake/Output from previous day: 06/29 0701 - 06/30 0700 In: 2574.2 [P.O.:1200; I.V.:1374.2] Out: 1600 [Urine:1600] Intake/Output this shift: No intake/output data recorded.   General appearance: alert and cooperative GI: normal findings: soft, nondistended  Incision: no significant drainage  Lab Results:  Recent Labs    08/19/21 0409 08/20/21 0433  WBC 16.1* 13.6*  HGB 11.2* 12.6*  HCT 34.9* 37.8*  PLT 176 208    BMET Recent Labs    08/19/21 0409 08/20/21 0433  NA 137 139  K 4.9 5.3*  CL 109 112*  CO2 20* 22  GLUCOSE 168* 126*  BUN 23 22  CREATININE 2.01* 1.68*  CALCIUM 9.2 10.0    PT/INR No results for input(s): "LABPROT", "INR" in the last 72 hours. ABG No results for input(s): "PHART", "HCO3" in the last 72 hours.  Invalid input(s): "PCO2", "PO2"  MEDS, Scheduled  acetaminophen  1,000 mg Oral Q6H   amLODipine  10 mg Oral Daily   carvedilol  6.25 mg Oral BID WC   enoxaparin (LOVENOX) injection  40 mg Subcutaneous Q24H   feeding supplement  237 mL Oral BID BM   gabapentin  300 mg Oral BID   saccharomyces boulardii  250 mg Oral BID    Studies/Results: No results found.  Assessment: s/p Procedure(s): XI ROBOT ASSISTED RIGHT COLECTOMY, ASSESSMENT OF TISSUE PERFUSION WITH INJECTION OF FIREFLY Patient Active Problem List   Diagnosis Date Noted   Colon cancer ([place]) 08/18/2021   Abnormal PET scan of colon 06/21/2021   Family history of colon cancer 06/08/2021   Centrilobular emphysema ([place]) 05/18/2021   Microcytic anemia 05/18/2021   Cancer of  left colon ([place]) 05/11/2021   Colonic mass s/p robotic splenic flexure resection 04/23/2021 04/23/2021   S/P AAA repair 02/26/2021   AAA (abdominal aortic aneurysm) without rupture ([place]) 02/16/2021   Complete heart block ([place]) 02/04/2021   Alcohol abuse 02/04/2021   Tobacco abuse 02/04/2021    Class: Chronic   CKD (chronic kidney disease), stage III ([place]) 02/04/2021   Essential hypertension 02/01/2021   Encounter for abdominal aortic aneurysm (AAA) screening 02/01/2021    Expected post op course  Plan: Advance diet to soft foods Cont IVF's, recheck Cr and Hgb in AM   LOS: 2 days     .Rosario Adie, MD [facility], [place]    08/20/2021 7:42 AM

## 2021-08-20 NOTE — Progress Notes (Signed)
Patient expresses to me that he has vomited. NT states that he filled up the whole blue bag. Unsure how much the amount was but did confirm he vomited. Advised him to pace himself with PO intake. Will give PRN and reassess.

## 2021-08-20 NOTE — Progress Notes (Signed)
  Transition of Care [facility]) Screening Note   Patient Details  Name: Fernando Dean Date of Birth: Jul 08, 1950   Transition of Care [facility]) CM/SW Contact:    Vassie Moselle, LCSW Phone Number: 08/20/2021, 10:59 AM    Transition of Care Department [facility]) has reviewed patient and no TOC needs have been identified at this time. We will continue to monitor patient advancement through interdisciplinary progression rounds. If new patient transition needs arise, please place a TOC consult.

## 2021-08-20 NOTE — Discharge Instructions (Signed)
SURGERY: POST OP INSTRUCTIONS (Surgery for small bowel obstruction, colon resection, etc)   ######################################################################  EAT Gradually transition to a high fiber diet with a fiber supplement over the next few days after discharge  WALK Walk an hour a day.  Control your pain to do that.    CONTROL PAIN Control pain so that you can walk, sleep, tolerate sneezing/coughing, go up/down stairs.  HAVE A BOWEL MOVEMENT DAILY Keep your bowels regular to avoid problems.  OK to try a laxative to override constipation.  OK to use an antidairrheal to slow down diarrhea.  Call if not better after 2 tries  CALL IF YOU HAVE PROBLEMS/CONCERNS Call if you are still struggling despite following these instructions. Call if you have concerns not answered by these instructions  ######################################################################   DIET Follow a light diet the first few days at home.  Start with a bland diet such as soups, liquids, starchy foods, low fat foods, etc.  If you feel full, bloated, or constipated, stay on a ful liquid or pureed/blenderized diet for a few days until you feel better and no longer constipated. Be sure to drink plenty of fluids every day to avoid getting dehydrated (feeling dizzy, not urinating, etc.). Gradually add a fiber supplement to your diet over the next week.  Gradually get back to a regular solid diet.  Avoid fast food or heavy meals the first week as you are more likely to get nauseated. It is expected for your digestive tract to need a few months to get back to normal.  It is common for your bowel movements and stools to be irregular.  You will have occasional bloating and cramping that should eventually fade away.  Until you are eating solid food normally, off all pain medications, and back to regular activities; your bowels will not be normal. Focus on eating a low-fat, high fiber diet the rest of your life  (See Getting to Good Bowel Health, below).  CARE of your INCISION or WOUND  It is good for closed incisions and even open wounds to be washed every day.  Shower every day.  Short baths are fine.  Wash the incisions and wounds clean with soap & water.    You may leave closed incisions open to air if it is dry.   You may cover the incision with clean gauze & replace it after your daily shower for comfort.  STAPLES: You have skin staples.  Leave them in place & set up an appointment for them to be removed by a surgery office nurse ~10 days after surgery. = 1st week of January 2024    ACTIVITIES as tolerated Start light daily activities --- self-care, walking, climbing stairs-- beginning the day after surgery.  Gradually increase activities as tolerated.  Control your pain to be active.  Stop when you are tired.  Ideally, walk several times a day, eventually an hour a day.   Most people are back to most day-to-day activities in a few weeks.  It takes 4-8 weeks to get back to unrestricted, intense activity. If you can walk 30 minutes without difficulty, it is safe to try more intense activity such as jogging, treadmill, bicycling, low-impact aerobics, swimming, etc. Save the most intensive and strenuous activity for last (Usually 4-8 weeks after surgery) such as sit-ups, heavy lifting, contact sports, etc.  Refrain from any intense heavy lifting or straining until you are off narcotics for pain control.  You will have off days, but things should improve   week-by-week. DO NOT PUSH THROUGH PAIN.  Let pain be your guide: If it hurts to do something, don't do it.  Pain is your body warning you to avoid that activity for another week until the pain goes down. You may drive when you are no longer taking narcotic prescription pain medication, you can comfortably wear a seatbelt, and you can safely make sudden turns/stops to protect yourself without hesitating due to pain. You may have sexual intercourse when it  is comfortable. If it hurts to do something, stop.  MEDICATIONS Take your usually prescribed home medications unless otherwise directed.   Blood thinners:  Usually you can restart any strong blood thinners after the second postoperative day.  It is OK to take aspirin right away.     If you are on strong blood thinners (warfarin/Coumadin, Plavix, Xerelto, Eliquis, Pradaxa, etc), discuss with your surgeon, medicine PCP, and/or cardiologist for instructions on when to restart the blood thinner & if blood monitoring is needed (PT/INR blood check, etc).     PAIN CONTROL Pain after surgery or related to activity is often due to strain/injury to muscle, tendon, nerves and/or incisions.  This pain is usually short-term and will improve in a few months.  To help speed the process of healing and to get back to regular activity more quickly, DO THE FOLLOWING THINGS TOGETHER: Increase activity gradually.  DO NOT PUSH THROUGH PAIN Use Ice and/or Heat Try Gentle Massage and/or Stretching Take over the counter pain medication Take Narcotic prescription pain medication for more severe pain  Good pain control = faster recovery.  It is better to take more medicine to be more active than to stay in bed all day to avoid medications.  Increase activity gradually Avoid heavy lifting at first, then increase to lifting as tolerated over the next 6 weeks. Do not "push through" the pain.  Listen to your body and avoid positions and maneuvers than reproduce the pain.  Wait a few days before trying something more intense Walking an hour a day is encouraged to help your body recover faster and more safely.  Start slowly and stop when getting sore.  If you can walk 30 minutes without stopping or pain, you can try more intense activity (running, jogging, aerobics, cycling, swimming, treadmill, sex, sports, weightlifting, etc.) Remember: If it hurts to do it, then don't do it! Use Ice and/or Heat You will have swelling and  bruising around the incisions.  This will take several weeks to resolve. Ice packs or heating pads (6-8 times a day, 30-60 minutes at a time) will help sooth soreness & bruising. Some people prefer to use ice alone, heat alone, or alternate between ice & heat.  Experiment and see what works best for you.  Consider trying ice for the first few days to help decrease swelling and bruising; then, switch to heat to help relax sore spots and speed recovery. Shower every day.  Short baths are fine.  It feels good!  Keep the incisions and wounds clean with soap & water.   Try Gentle Massage and/or Stretching Massage at the area of pain many times a day Stop if you feel pain - do not overdo it Take over the counter pain medication This helps the muscle and nerve tissues become less irritable and calm down faster Choose ONE of the following over-the-counter anti-inflammatory medications: Acetaminophen 500mg tabs (Tylenol) 1-2 pills with every meal and just before bedtime (avoid if you have liver problems or if you have   acetaminophen in you narcotic prescription) Naproxen 220mg tabs (ex. Aleve, Naprosyn) 1-2 pills twice a day (avoid if you have kidney, stomach, IBD, or bleeding problems) Ibuprofen 200mg tabs (ex. Advil, Motrin) 3-4 pills with every meal and just before bedtime (avoid if you have kidney, stomach, IBD, or bleeding problems) Take with food/snack several times a day as directed for at least 2 weeks to help keep pain / soreness down & more manageable. Take Narcotic prescription pain medication for more severe pain A prescription for strong pain control is often given to you upon discharge (for example: oxycodone/Percocet, hydrocodone/Norco/Vicodin, or tramadol/Ultram) Take your pain medication as prescribed. Be mindful that most narcotic prescriptions contain Tylenol (acetaminophen) as well - avoid taking too much Tylenol. If you are having problems/concerns with the prescription medicine (does  not control pain, nausea, vomiting, rash, itching, etc.), please call us [phone] to see if we need to switch you to a different pain medicine that will work better for you and/or control your side effects better. If you need a refill on your pain medication, you must call the office before 4 pm and on weekdays only.  By federal law, prescriptions for narcotics cannot be called into a pharmacy.  They must be filled out on paper & picked up from our office by the patient or authorized caretaker.  Prescriptions cannot be filled after 4 pm nor on weekends.    WHEN TO CALL US [phone] Severe uncontrolled or worsening pain  Fever over 101 F (38.5 C) Concerns with the incision: Worsening pain, redness, rash/hives, swelling, bleeding, or drainage Reactions / problems with new medications (itching, rash, hives, nausea, etc.) Nausea and/or vomiting Difficulty urinating Difficulty breathing Worsening fatigue, dizziness, lightheadedness, blurred vision Other concerns If you are not getting better after two weeks or are noticing you are getting worse, contact our office [phone] for further advice.  We may need to adjust your medications, re-evaluate you in the office, send you to the emergency room, or see what other things we can do to help. The clinic staff is available to answer your questions during regular business hours (8:30am-5pm).  Please don't hesitate to call and ask to speak to one of our nurses for clinical concerns.    A surgeon from [facility] is always on call at the hospitals 24 hours/day If you have a medical emergency, go to the nearest emergency room or call 911.  FOLLOW UP in our office One the day of your discharge from the hospital (or the next business weekday), please call [facility] to set up or confirm an appointment to see your surgeon in the office for a follow-up appointment.  Usually it is 2-3 weeks after your surgery.   If you  have skin staples at your incision(s), let the office know so we can set up a time in the office for the nurse to remove them (usually around 10 days after surgery). Make sure that you call for appointments the day of discharge (or the next business weekday) from the hospital to ensure a convenient appointment time. IF YOU HAVE DISABILITY OR FAMILY LEAVE FORMS, BRING THEM TO THE OFFICE FOR PROCESSING.  DO NOT GIVE THEM TO YOUR DOCTOR.  [facility] [address], [address], [place], [place]  [address] ? [phone] - Main [phone] - Toll Free,  [phone] - Fax [email or web address]    GETTING TO GOOD BOWEL HEALTH. It is expected for your digestive tract to   need a few months to get back to normal.  It is common for your bowel movements and stools to be irregular.  You will have occasional bloating and cramping that should eventually fade away.  Until you are eating solid food normally, off all pain medications, and back to regular activities; your bowels will not be normal.   Avoiding constipation The goal: ONE SOFT BOWEL MOVEMENT A DAY!    Drink plenty of fluids.  Choose water first. TAKE A FIBER SUPPLEMENT EVERY DAY THE REST OF YOUR LIFE During your first week back home, gradually add back a fiber supplement every day Experiment which form you can tolerate.   There are many forms such as powders, tablets, wafers, gummies, etc Psyllium bran (Metamucil), methylcellulose (Citrucel), Miralax or Glycolax, Benefiber, Flax Seed.  Adjust the dose week-by-week (1/2 dose/day to 6 doses a day) until you are moving your bowels 1-2 times a day.  Cut back the dose or try a different fiber product if it is giving you problems such as diarrhea or bloating. Sometimes a laxative is needed to help jump-start bowels if constipated until the fiber supplement can help regulate your bowels.  If you are tolerating eating & you are farting, it is okay to try a gentle  laxative such as double dose MiraLax, prune juice, or Milk of Magnesia.  Avoid using laxatives too often. Stool softeners can sometimes help counteract the constipating effects of narcotic pain medicines.  It can also cause diarrhea, so avoid using for too long. If you are still constipated despite taking fiber daily, eating solids, and a few doses of laxatives, call our office. Controlling diarrhea Try drinking liquids and eating bland foods for a few days to avoid stressing your intestines further. Avoid dairy products (especially milk & ice cream) for a short time.  The intestines often can lose the ability to digest lactose when stressed. Avoid foods that cause gassiness or bloating.  Typical foods include beans and other legumes, cabbage, broccoli, and dairy foods.  Avoid greasy, spicy, fast foods.  Every person has some sensitivity to other foods, so listen to your body and avoid those foods that trigger problems for you. Probiotics (such as active yogurt, Align, etc) may help repopulate the intestines and colon with normal bacteria and calm down a sensitive digestive tract Adding a fiber supplement gradually can help thicken stools by absorbing excess fluid and retrain the intestines to act more normally.  Slowly increase the dose over a few weeks.  Too much fiber too soon can backfire and cause cramping & bloating. It is okay to try and slow down diarrhea with a few doses of antidiarrheal medicines.   Bismuth subsalicylate (ex. Kayopectate, Pepto Bismol) for a few doses can help control diarrhea.  Avoid if pregnant.   Loperamide (Imodium) can slow down diarrhea.  Start with one tablet (2mg) first.  Avoid if you are having fevers or severe pain.  ILEOSTOMY PATIENTS WILL HAVE CHRONIC DIARRHEA since their colon is not in use.    Drink plenty of liquids.  You will need to drink even more glasses of water/liquid a day to avoid getting dehydrated. Record output from your ileostomy.  Expect to empty  the bag every 3-4 hours at first.  Most people with a permanent ileostomy empty their bag 4-6 times at the least.   Use antidiarrheal medicine (especially Imodium) several times a day to avoid getting dehydrated.  Start with a dose at bedtime & breakfast.  Adjust up or   down as needed.  Increase antidiarrheal medications as directed to avoid emptying the bag more than 8 times a day (every 3 hours). Work with your wound ostomy nurse to learn care for your ostomy.  See ostomy care instructions. TROUBLESHOOTING IRREGULAR BOWELS 1) Start with a soft & bland diet. No spicy, greasy, or fried foods.  2) Avoid gluten/wheat or dairy products from diet to see if symptoms improve. 3) Miralax 17gm or flax seed mixed in 8oz. water or juice-daily. May use 2-4 times a day as needed. 4) Gas-X, Phazyme, etc. as needed for gas & bloating.  5) Prilosec (omeprazole) over-the-counter as needed 6)  Consider probiotics (Align, Activa, etc) to help calm the bowels down  Call your doctor if you are getting worse or not getting better.  Sometimes further testing (cultures, endoscopy, X-ray studies, CT scans, bloodwork, etc.) may be needed to help diagnose and treat the cause of the diarrhea. [facility] [address], [address], [place], [place]  [address] [phone] - Main.    [phone]  - Toll Free.   [phone] - Fax [email or web address]   ###############################   #######################################################  Ostomy Support Information  You've heard that people get along just fine with only one of their eyes, or one of their lungs, or one of their kidneys. But you also know that you have only one intestine and only one bladder, and that leaves you feeling awfully empty, both physically and emotionally: You think no other people go around without part of their intestine with the ends of their intestines sticking out through their abdominal walls.    YOU ARE NOT ALONE.  There are nearly three quarters of a million people in the [place] who have an ostomy; people who have had surgery to remove all or part of their colons or bladders.   There is even a national association, the [employer] with over 350 local affiliated support groups that are organized by volunteers who provide peer support and counseling. [employer] has a toll free telephone num-ber, 800-826-0826 and an educational, interactive website, www.ostomy.org   An ostomy is an opening in the belly (abdominal wall) made by surgery. Ostomates are people who have had this procedure. The opening (stoma) allows the kidney or bowel to grdischarge waste. An external pouch covers the stoma to collect waste. Pouches are are a simple bag and are odor free. Different companies have disposable or reusable pouches to fit one's lifestyle. An ostomy can either be temporary or permanent.   THERE ARE THREE MAIN TYPES OF OSTOMIES Colostomy. A colostomy is a surgically created opening in the large intestine (colon). Ileostomy. An ileostomy is a surgically created opening in the small intestine. Urostomy. A urostomy is a surgically created opening to divert urine away from the bladder.  OSTOMY Care  The following guidelines will make care of your colostomy easier. Keep this information close by for quick reference.  Helpful DIET hints Eat a well-balanced diet including vegetables and fresh fruits. Eat on a regular schedule.  Drink at least 6 to 8 glasses of fluids daily. Eat slowly in a relaxed atmosphere. Chew your food thoroughly. Avoid chewing gum, smoking, and drinking from a straw. This will help decrease the amount of air you swallow, which may help reduce gas. Eating yogurt or drinking buttermilk may help reduce gas.  To control gas at night, do not eat after 8 p.m. This will give your bowel time to quiet down before you go   to bed.  If gas is a problem, you can purchase  Beano. Sprinkle Beano on the first bite of food before eating to reduce gas. It has no flavor and should not change the taste of your food. You can buy Beano over the counter at your local drugstore.  Foods like fish, onions, garlic, broccoli, asparagus, and cabbage produce odor. Although your pouch is odor-proof, if you eat these foods you may notice a stronger odor when emptying your pouch. If this is a concern, you may want to limit these foods in your diet.  If you have an ileostomy, you will have chronic diarrhea & need to drink more liquids to avoid getting dehydrated.  Consider antidiarrheal medicine like imodium (loperamide) or Lomotil to help slow down bowel movements / diarrhea into your ileostomy bag.  GETTING TO GOOD BOWEL HEALTH WITH AN ILEOSTOMY    With the colon bypassed & not in use, you will have small bowel diarrhea.   It is important to thicken & slow your bowel movements down.   The goal: 4-6 small BOWEL MOVEMENTS A DAY It is important to drink plenty of liquids to avoid getting dehydrated  CONTROLLING ILEOSTOMY DIARRHEA  TAKE A FIBER SUPPLEMENT (FiberCon or Benefiner soluble fiber) twice a day - to thicken stools by absorbing excess fluid and retrain the intestines to act more normally.  Slowly increase the dose over a few weeks.  Too much fiber too soon can backfire and cause cramping & bloating.  TAKE AN IRON SUPPLEMENT twice a day to naturally constipate your bowels.  Usually ferrous sulfate 325mg twice a day)  TAKE ANTI-DIARRHEAL MEDICINES: Loperamide (Imodium) can slow down diarrhea.  Start with two tablets (= 4mg) first and then try one tablet every 6 hours.  Can go up to 2 pills four times day (8 pills of 2mg max) Avoid if you are having fevers or severe pain.  If you are not better or start feeling worse, stop all medicines and call your doctor for advice LoMotil (Diphenoxylate / Atropine) is another medicine that can constipate & slow down bowel moevements Pepto  Bismol (bismuth) can gently thicken bowels as well  If diarrhea is worse,: drink plenty of liquids and try simpler foods for a few days to avoid stressing your intestines further. Avoid dairy products (especially milk & ice cream) for a short time.  The intestines often can lose the ability to digest lactose when stressed. Avoid foods that cause gassiness or bloating.  Typical foods include beans and other legumes, cabbage, broccoli, and dairy foods.  Every person has some sensitivity to other foods, so listen to our body and avoid those foods that trigger problems for you.Call your doctor if you are getting worse or not better.  Sometimes further testing (cultures, endoscopy, X-ray studies, bloodwork, etc) may be needed to help diagnose and treat the cause of the diarrhea. Take extra anti-diarrheal medicines (maximum is 8 pills of 2mg loperamide a day)   Tips for POUCHING an OSTOMY   Changing Your Pouch The best time to change your pouch is in the morning, before eating or drinking anything. Your stoma can function at any time, but it will function more after eating or drinking.   Applying the pouching system  Place all your equipment close at hand before removing your pouch.  Wash your hands.  Stand or sit in front of a mirror. Use the position that works best for you. Remember that you must keep the skin around the stoma   wrinkle-free for a good seal.  Gently remove the used pouch (1-piece system) or the pouch and old wafer (2-piece system). Empty the pouch into the toilet. Save the closure clip to use again.  Wash the stoma itself and the skin around the stoma. Your stoma may bleed a little when being washed. This is normal. Rinse and pat dry. You may use a wash cloth or soft paper towels (like Bounty), mild soap (like Dial, Safeguard, or Ivory), and water. Avoid soaps that contain perfumes or lotions.  For a new pouch (1-piece system) or a new wafer (2-piece system), measure your  stoma using the stoma guide in each box of supplies.  Trace the shape of your stoma onto the back of the new pouch or the back of the new wafer. Cut out the opening. Remove the paper backing and set it aside.  Optional: Apply a skin barrier powder to surrounding skin if it is irritated (bare or weeping), and dust off the excess. Optional: Apply a skin-prep wipe (such as Skin Prep or All-Kare) to the skin around the stoma, and let it dry. Do not apply this solution if the skin is irritated (red, tender, or broken) or if you have shaved around the stoma. Optional: Apply a skin barrier paste (such as Stomahesive, Coloplast, or Premium) around the opening cut in the back of the pouch or wafer. Allow it to dry for 30 to 60 seconds.  Hold the pouch (1-piece system) or wafer (2-piece system) with the sticky side toward your body. Make sure the skin around the stoma is wrinkle-free. Center the opening on the stoma, then press firmly to your abdomen (Fig. 4). Look in the mirror to check if you are placing the pouch, or wafer, in the right position. For a 2-piece system, snap the pouch onto the wafer. Make sure it snaps into place securely.  Place your hand over the stoma and the pouch or wafer for about 30 seconds. The heat from your hand can help the pouch or wafer stick to your skin.  Add deodorant (such as Super Banish or Nullo) to your pouch. Other options include food extracts such as vanilla oil and peppermint extract. Add about 10 drops of the deodorant to the pouch. Then apply the closure clamp. Note: Do not use toxic  chemicals or commercial cleaning agents in your pouch. These substances may harm the stoma.  Optional: For extra seal, apply tape to all 4 sides around the pouch or wafer, as if you were framing a picture. You may use any brand of medical adhesive tape. Change your pouch every 5 to 7 days. Change it immediately if a leak occurs.  Wash your hands afterwards.  If you are wearing a  2-piece system, you may use 2 new pouches per week and alternate them. Rinse the pouch with mild soap and warm water and hang it to dry for the next day. Apply the fresh pouch. Alternate the 2 pouches like this for a week. After a week, change the wafer and begin with 2 new pouches. Place the old pouches in a plastic bag, and put them in the trash.   LIVING WITH AN OSTOMY  Emptying Your Pouch Empty your pouch when it is one-third full (of urine, stool, and/or gas). If you wait until your pouch is fuller than this, it will be more difficult to empty and more noticeable. When you empty your pouch, either put toilet paper in the toilet bowl first, or flush the   toilet while you empty the pouch. This will reduce splashing. You can empty the pouch between your legs or to one side while sitting, or while standing or stooping. If you have a 2-piece system, you can snap off the pouch to empty it. Remember that your stoma may function during this time. If you wish to rinse your pouch after you empty it, a turkey baster can be helpful. When using a baster, squirt water up into the pouch through the opening at the bottom. With a 2-piece system, you can snap off the pouch to rinse it. After rinsing  your pouch, empty it into the toilet. When rinsing your pouch at home, put a few granules of Dreft soap in the rinse water. This helps lubricate and freshen your pouch. The inside of your pouch can be sprayed with non-stick cooking oil (Pam spray). This may help reduce stool sticking to the inside of the pouch.  Bathing You may shower or bathe with your pouch on or off. Remember that your stoma may function during this time.  The materials you use to wash your stoma and the skin around it should be clean, but they do not need to be sterile.  Wearing Your Pouch During hot weather, or if you perspire a lot in general, wear a cover over your pouch. This may prevent a rash on your skin under the pouch. Pouch covers are  sold at ostomy supply stores. Wear the pouch inside your underwear for better support. Watch your weight. Any gain or loss of 10 to 15 pounds or more can change the way your pouch fits.  Going Away From Home A collapsible cup (like those that come in travel kits) or a soft plastic squirt bottle with a pull-up top (like a travel bottle for shampoo) can be used for rinsing your pouch when you are away from home. Tilt the opening of the pouch at an upward angle when using a cup to rinse.  Carry wet wipes or extra tissues to use in public bathrooms.  Carry an extra pouching system with you at all times.  Never keep ostomy supplies in the glove compartment of your car. Extreme heat or cold can damage the skin barriers and adhesive wafers on the pouch.  When you travel, carry your ostomy supplies with you at all times. Keep them within easy reach. Do not pack ostomy supplies in baggage that will be checked or otherwise separated from you, because your baggage might be lost. If you're traveling out of the country, it is helpful to have a letter stating that you are carrying ostomy supplies as a medical necessity.  If you need ostomy supplies while traveling, look in the yellow pages of the telephone book under "Surgical Supplies." Or call the local ostomy organization to find out where supplies are available.  Do not let your ostomy supplies get low. Always order new pouches before you use the last one.  Reducing Odor Limit foods such as broccoli, cabbage, onions, fish, and garlic in your diet to help reduce odor. Each time you empty your pouch, carefully clean the opening of the pouch, both inside and outside, with toilet paper. Rinse your pouch 1 or 2 times daily after you empty it (see directions for emptying your pouch and going away from home). Add deodorant (such as Super Banish or Nullo) to your pouch. Use air deodorizers in your bathroom. Do not add aspirin to your pouch. Even though  aspirin can help prevent odor, it   could cause ulcers on your stoma.  When to call the doctor Call the doctor if you have any of the following symptoms: Purple, black, or white stoma Severe cramps lasting more than 6 hours Severe watery discharge from the stoma lasting more than 6 hours No output from the colostomy for 3 days Excessive bleeding from your stoma Swelling of your stoma to more than 1/2-inch larger than usual Pulling inward of your stoma below skin level Severe skin irritation or deep ulcers Bulging or other changes in your abdomen  When to call your ostomy nurse Call your ostomy/enterostomal therapy (WOCN) nurse if any of the following occurs: Frequent leaking of your pouching system Change in size or appearance of your stoma, causing discomfort or problems with your pouch Skin rash or rawness Weight gain or loss that causes problems with your pouch     FREQUENTLY ASKED QUESTIONS   Why haven't you met any of these folks who have an ostomy?  Well, maybe you have! You just did not recognize them because an ostomy doesn't show. It can be kept secret if you wish. Why, maybe some of your best friends, office associates or neighbors have an ostomy ... you never can tell. People facing ostomy surgery have many quality-of-life questions like: Will you bulge? Smell? Make noises? Will you feel waste leaving your body? Will you be a captive of the toilet? Will you starve? Be a social outcast? Get/stay married? Have babies? Easily bathe, go swimming, bend over?  OK, let's look at what you can expect:   Will you bulge?  Remember, without part of the intestine or bladder, and its contents, you should have a flatter tummy than before. You can expect to wear, with little exception, what you wore before surgery ... and this in-cludes tight clothing and bathing suits.   Will you smell?  Today, thanks to modern odor proof pouching systems, you can walk into an ostomy support group  meeting and not smell anything that is foul or offensive. And, for those with an ileostomy or colostomy who are concerned about odor when emptying their pouch, there are in-pouch deodorants that can be used to eliminate any waste odors that may exist.   Will you make noises?  Everyone produces gas, especially if they are an air-swallower. But intestinal sounds that occur from time to time are no differ-ent than a gurgling tummy, and quite often your clothing will muffle any sounds.   Will you feel the waste discharges?  For those with a colostomy or ileostomy there might be a slight pressure when waste leaves your body, but understand that the intestines have no nerve endings, so there will be no unpleasant sensations. Those with a urostomy will probably be unaware of any kidney drainage.   Will you be a captive of the toilet?  Immediately post-op you will spend more time in the bathroom than you will after your body recovers from surgery. Every person is different, but on average those with an ileostomy or urostomy may empty their pouches 4 to 6 times a day; a little  less if you have a colostomy. The average wear time between pouch system changes is 3 to 5 days and the changing process should take less than 30 minutes.   Will I need to be on a special diet? Most people return to their normal diet when they have recovered from surgery. Be sure to chew your food well, eat a well-balanced diet and drink plenty of fluids. If   you experience problems with a certain food, wait a couple of weeks and try it again.  Will there be odor and noises? Pouching systems are designed to be odor-proof or odor-resistant. There are deodorants that can be used in the pouch. Medications are also available to help reduce odor. Limit gas-producing foods and carbonated beverages. You will experience less gas and fewer noises as you heal from surgery.  How much time will it take to care for my ostomy? At first, you may  spend a lot of time learning about your ostomy and how to take care of it. As you become more comfortable and skilled at changing the pouching system, it will take very little time to care for it.   Will I be able to return to work? People with ostomies can perform most jobs. As soon as you have healed from surgery, you should be able to return to work. Heavy lifting (more than 10 pounds) may be discouraged.   What about intimacy? Sexual relationships and intimacy are important and fulfilling aspects of your life. They should continue after ostomy surgery. Intimacy-related concerns should be discussed openly between you and your partner.   Can I wear regular clothing? You do not need to wear special clothing. Ostomy pouches are fairly flat and barely noticeable. Elastic undergarments will not hurt the stoma or prevent the ostomy from functioning.   Can I participate in sports? An ostomy should not limit your involvement in sports. Many people with ostomies are runners, skiers, swimmers or participate in other active lifestyles. Talk with your caregiver first before doing heavy physical activity.  Will you starve?  Not if you follow doctor's orders at each stage of your post-op adjustment. There is no such thing as an "ostomy diet". Some people with an ostomy will be able to eat and tolerate anything; others may find diffi-culty with some foods. Each person is an individual and must determine, by trial, what is best for them. A good practice for all is to drink plenty of water.   Will you be a social outcast?  Have you met anyone who has an ostomy and is a social outcast? Why should you be the first? Only your attitude and self image will effect how you are treated. No confi-dent person is an outcast.    PROFESSIONAL HELP   Resources are available if you need help or have questions about your ostomy.   Specially trained nurses called Wound, Ostomy Continence Nurses (WOCN) are available for  consultation in most major medical centers.  Consider getting an ostomy consult at an outpatient ostomy clinic.   [place] has an Ostomy Clinic run by an WOCN ostomy nurse at the [facility].  [phone]. [facility] can help set up an appointment   The United Ostomy Association (UOA) is a group made up of many local chapters throughout the [place]. These local groups hold meetings and provide support to prospective and existing ostomates. They sponsor educational events and have qualified visitors to make personal or telephone visits. Contact the UOA for the chapter nearest you and for other educational publications.  More detailed information can be found in Colostomy Guide, a publication of the United Ostomy Association (UOA). Contact UOA at 1-800-826-0826 or visit their web site at www.uoaa.org. The website contains links to other sites, suppliers and resources.  [facility] Secure Start Services: Start at the website to enlist for support.  Your Wound Ostomy (WOCN) nurse may have started this   process. https://www.hollister.com/en/securestart Secure Start services are designed to support people as they live their lives with an ostomy or neurogenic bladder. Enrolling is easy and at no cost to the patient. We realize that each person's needs and life journey are different. Through Secure Start services, we want to help people live their life, their way.  #######################################################  

## 2021-08-20 NOTE — Progress Notes (Signed)
Pt had some nausea at this time. Prn zofran given.

## 2021-08-21 DIAGNOSIS — Z85038 Personal history of other malignant neoplasm of large intestine: Secondary | ICD-10-CM

## 2021-08-21 LAB — BASIC METABOLIC PANEL
Anion gap: 5 (ref 5–15)
BUN: 22 mg/dL (ref 8–23)
CO2: 24 mmol/L (ref 22–32)
Calcium: 9.8 mg/dL (ref 8.9–10.3)
Chloride: 107 mmol/L (ref 98–111)
Creatinine, Ser: 1.61 mg/dL — ABNORMAL HIGH (ref 0.61–1.24)
GFR, Estimated: 46 mL/min — ABNORMAL LOW (ref >60)
Glucose, Bld: 125 mg/dL — ABNORMAL HIGH (ref 70–99)
Potassium: 4.5 mmol/L (ref 3.5–5.1)
Sodium: 136 mmol/L (ref 135–145)

## 2021-08-21 LAB — CBC
HCT: 39.2 % (ref 39.0–52.0)
Hemoglobin: 12.7 g/dL — ABNORMAL LOW (ref 13.0–17.0)
MCH: 30.5 pg (ref 26.0–34.0)
MCHC: 32.4 g/dL (ref 30.0–36.0)
MCV: 94.2 fL (ref 80.0–100.0)
Platelets: 224 10*3/uL (ref 150–400)
RBC: 4.16 MIL/uL — ABNORMAL LOW (ref 4.22–5.81)
RDW: 18.6 % — ABNORMAL HIGH (ref 11.5–15.5)
WBC: 10.8 10*3/uL — ABNORMAL HIGH (ref 4.0–10.5)
nRBC: 0 % (ref 0.0–0.2)

## 2021-08-21 MED ORDER — MULTI-VITAMIN/MINERALS PO TABS: 1.0000 | ORAL_TABLET | Freq: Every day | ORAL | Status: DC

## 2021-08-21 MED ORDER — MAGIC MOUTHWASH: 15.0000 mL | Freq: Four times a day (QID) | ORAL | Status: DC | PRN

## 2021-08-21 MED ORDER — LIP MEDEX EX OINT
TOPICAL_OINTMENT | Freq: Two times a day (BID) | CUTANEOUS | Status: DC
  Administered 2021-08-21: 75 via TOPICAL
  Administered 2021-08-22: 1 via TOPICAL
  Administered 2021-08-22 – 2021-08-23 (×2): 75 via TOPICAL
  Filled 2021-08-21: qty 7

## 2021-08-21 MED ORDER — ADULT MULTIVITAMIN W/MINERALS CH
1.0000 | ORAL_TABLET | Freq: Every day | ORAL | Status: DC
  Administered 2021-08-21 – 2021-08-24 (×4): 1 via ORAL
  Filled 2021-08-21 (×4): qty 1

## 2021-08-21 MED ORDER — SIMETHICONE 80 MG PO CHEW
80.0000 mg | CHEWABLE_TABLET | Freq: Four times a day (QID) | ORAL | Status: AC
  Administered 2021-08-21 – 2021-08-23 (×12): 80 mg via ORAL
  Filled 2021-08-21 (×12): qty 1

## 2021-08-21 MED ORDER — DIPHENHYDRAMINE HCL 25 MG PO TABS
25.0000 mg | ORAL_TABLET | Freq: Three times a day (TID) | ORAL | Status: DC
  Filled 2021-08-21: qty 1

## 2021-08-21 MED ORDER — SIMETHICONE 80 MG PO CHEW: 40.0000 mg | CHEWABLE_TABLET | Freq: Four times a day (QID) | ORAL | Status: DC | PRN

## 2021-08-21 MED ORDER — FAMOTIDINE 20 MG PO TABS
20.0000 mg | ORAL_TABLET | Freq: Two times a day (BID) | ORAL | Status: DC
  Administered 2021-08-21 – 2021-08-24 (×7): 20 mg via ORAL
  Filled 2021-08-21 (×7): qty 1

## 2021-08-21 MED ORDER — CALCIUM POLYCARBOPHIL 625 MG PO TABS
625.0000 mg | ORAL_TABLET | Freq: Two times a day (BID) | ORAL | Status: DC
  Administered 2021-08-21 – 2021-08-24 (×7): 625 mg via ORAL
  Filled 2021-08-21 (×7): qty 1

## 2021-08-21 MED ORDER — ONDANSETRON HCL 4 MG PO TABS
4.0000 mg | ORAL_TABLET | Freq: Once | ORAL | Status: AC
  Administered 2021-08-21: 4 mg via ORAL
  Filled 2021-08-21: qty 1

## 2021-08-21 MED ORDER — GABAPENTIN 100 MG PO CAPS
100.0000 mg | ORAL_CAPSULE | Freq: Three times a day (TID) | ORAL | Status: DC
  Administered 2021-08-21 – 2021-08-24 (×10): 100 mg via ORAL
  Filled 2021-08-21 (×10): qty 1

## 2021-08-21 MED ORDER — DIPHENHYDRAMINE HCL 25 MG PO CAPS
25.0000 mg | ORAL_CAPSULE | Freq: Three times a day (TID) | ORAL | Status: DC
  Administered 2021-08-21 – 2021-08-24 (×10): 25 mg via ORAL
  Filled 2021-08-21 (×10): qty 1

## 2021-08-21 MED ORDER — SIMVASTATIN 20 MG PO TABS
20.0000 mg | ORAL_TABLET | Freq: Every day | ORAL | Status: DC
  Administered 2021-08-21 – 2021-08-23 (×3): 20 mg via ORAL
  Filled 2021-08-21 (×3): qty 1

## 2021-08-21 MED ORDER — LACTATED RINGERS IV BOLUS: 1000.0000 mL | Freq: Three times a day (TID) | INTRAVENOUS | Status: DC | PRN

## 2021-08-21 NOTE — Progress Notes (Addendum)
Fernando Dean [id number] [birth year]  CARE TEAM:  PCP: [name], [place]  Outpatient Care Team: Patient Care Team: [name], [place] as PCP - General (Family Medicine) Satira Sark, MD as PCP - Cardiology (Cardiology) Derek Jack, MD as Medical Oncologist (Medical Oncology) Brien Mates, RN as Oncology Nurse Navigator (Medical Oncology)  Inpatient Treatment Team: Treatment Team: Attending Provider: Leighton Ruff, MD; Social Worker: Craige Cotta; Technician: Abbe Amsterdam, NT; Registered Nurse: Dorena Bodo, RN; Utilization Review: Claudie Leach, RN   Problem List:   Principal Problem:   Colon cancer ([place])   3 Days Post-Op  08/18/2021  POST-OPERATIVE DIAGNOSIS:  colon cancer   PROCEDURE:  XI ROBOT ASSISTED RIGHT COLECTOMY, ASSESSMENT OF TISSUE PERFUSION WITH INJECTION OF FIREFLY   OR FINDINGS:    Patient had mass and tattoo in the transverse colon just proximal to the hepatic flexure   No obvious metastatic disease on visceral parietal peritoneum or liver.   Assessment  Okay.  [facility] Stay = 3 days)  Plan:  -Retry liquids.  Probably keep it on clears for now since still feeling somewhat bloated. -Scheduled simethicone and some nausea meds to help out. -Follow-up on pathology. -Hypertension control with Coreg.  As needed backup. -Hypercholesterolemia controlled -Reactive airway disease.  Inhalers. -Resume H2 blockers with his bloating and discomfort. -Has chronic kidney disease but is not oliguric.  2 half maintenance IV fluids with IV fluid backup. -Fiber bowel regimen hopefully to encourage things to move forward. -VTE prophylaxis- SCDs, etc -mobilize as tolerated to help recovery  Disposition:  Disposition:  The patient is from: Home  Anticipate discharge to:  Home with Home Health  Anticipated Date of Discharge is:  July 4,2023    Barriers to discharge:  Pending Clinical improvement (more likely than  not)  Patient currently is NOT MEDICALLY STABLE for discharge from the hospital from a surgery standpoint.      I reviewed nursing notes, last 24 h vitals and pain scores, last 48 h intake and output, last 24 h labs and trends, and last 24 h imaging results. I have reviewed this patient's available data, including medical history, events of note, test results, etc as part of my evaluation.  A significant portion of that time was spent in counseling.  Care during the described time interval was provided by me.  This care required moderate level of medical decision making.  08/21/2021    Subjective: (Chief complaint)  Feeling less nauseated.  Bloated with sips.  Trying to get up more.  Objective:  Vital signs:  Vitals:   08/20/21 0613 08/20/21 1343 08/20/21 2152 08/21/21 0520  BP: 139/86 121/83 131/80 140/89  Pulse: 75 81 80 79  Resp:  '18 18 18  '$ Temp: 98.2 F (36.8 C) 98.3 F (36.8 C) 98.4 F (36.9 C) 98.8 F (37.1 C)  TempSrc: Oral Oral Oral Oral  SpO2: 100% 94% 96% 95%  Weight:      Height:        Last BM Date : 08/20/21  Intake/Output   Yesterday:  06/30 0701 - 07/01 0700 In: 1349.1 [I.V.:1349.1] Out: 600 [Urine:300; Stool:300] This shift:  No intake/output data recorded.  Bowel function:  Flatus: YES  BM:  YES  Drain: (No drain)   Physical Exam:  General: Pt awake/alert in no acute distress Eyes: PERRL, normal EOM.  Sclera clear.  No icterus Neuro: CN II-XII intact w/o focal sensory/motor deficits. Lymph: No head/neck/groin lymphadenopathy Psych:  No delerium/psychosis/paranoia.  Oriented x 4 HENT: Normocephalic, Mucus membranes moist.  No thrush Neck: Supple, No tracheal deviation.  No obvious thyromegaly Chest: No pain to chest wall compression.  Good respiratory excursion.  No audible wheezing CV:  Pulses intact.  Regular rhythm.  No major extremity edema MS: Normal AROM mjr joints.  No obvious deformity  Abdomen: Soft.  Moderately  distended.  Mildly tender at incisions only.  No evidence of peritonitis.  No incarcerated hernias.  Ext:   No deformity.  No mjr edema.  No cyanosis Skin: No petechiae / purpurea.  No major sores.  Warm and dry    Results:   Cultures: No results found for this or any previous visit (from the past 720 hour(s)).  Labs: Results for orders placed or performed during the hospital encounter of 08/18/21 (from the past 48 hour(s))  CBC     Status: Abnormal   Collection Time: 08/20/21  4:33 AM  Result Value Ref Range   WBC 13.6 (H) 4.0 - 10.5 K/uL   RBC 4.00 (L) 4.22 - 5.81 MIL/uL   Hemoglobin 12.6 (L) 13.0 - 17.0 g/dL   HCT 37.8 (L) 39.0 - 52.0 %   MCV 94.5 80.0 - 100.0 fL   MCH 31.5 26.0 - 34.0 pg   MCHC 33.3 30.0 - 36.0 g/dL   RDW 18.9 (H) 11.5 - 15.5 %   Platelets 208 150 - 400 K/uL   nRBC 0.0 0.0 - 0.2 %    Comment: Performed at [facility], [place] [address] [place], [place] [address]  Basic metabolic panel     Status: Abnormal   Collection Time: 08/20/21  4:33 AM  Result Value Ref Range   Sodium 139 135 - 145 mmol/L   Potassium 5.3 (H) 3.5 - 5.1 mmol/L   Chloride 112 (H) 98 - 111 mmol/L   CO2 22 22 - 32 mmol/L   Glucose, Bld 126 (H) 70 - 99 mg/dL    Comment: Glucose reference range applies only to samples taken after fasting for at least 8 hours.   BUN 22 8 - 23 mg/dL   Creatinine, Ser 1.68 (H) 0.61 - 1.24 mg/dL   Calcium 10.0 8.9 - 10.3 mg/dL   GFR, Estimated 43 (L) >60 mL/min    Comment: (NOTE) Calculated using the CKD-EPI Creatinine Equation (2021)    Anion gap 5 5 - 15    Comment: Performed at [facility], [place] [address] [place], [place] [address]  CBC     Status: Abnormal   Collection Time: 08/21/21  5:17 AM  Result Value Ref Range   WBC 10.8 (H) 4.0 - 10.5 K/uL   RBC 4.16 (L) 4.22 - 5.81 MIL/uL   Hemoglobin 12.7 (L) 13.0 - 17.0 g/dL   HCT 39.2 39.0 - 52.0 %   MCV 94.2 80.0 - 100.0 fL   MCH 30.5 26.0 - 34.0 pg   MCHC  32.4 30.0 - 36.0 g/dL   RDW 18.6 (H) 11.5 - 15.5 %   Platelets 224 150 - 400 K/uL   nRBC 0.0 0.0 - 0.2 %    Comment: Performed at [facility], [place] [address], [place], [place] [address]  Basic metabolic panel     Status: Abnormal   Collection Time: 08/21/21  5:17 AM  Result Value Ref Range   Sodium 136 135 - 145 mmol/L   Potassium 4.5 3.5 - 5.1 mmol/L   Chloride 107 98 - 111 mmol/L   CO2 24 22 -  32 mmol/L   Glucose, Bld 125 (H) 70 - 99 mg/dL    Comment: Glucose reference range applies only to samples taken after fasting for at least 8 hours.   BUN 22 8 - 23 mg/dL   Creatinine, Ser 1.61 (H) 0.61 - 1.24 mg/dL   Calcium 9.8 8.9 - 10.3 mg/dL   GFR, Estimated 46 (L) >60 mL/min    Comment: (NOTE) Calculated using the CKD-EPI Creatinine Equation (2021)    Anion gap 5 5 - 15    Comment: Performed at [facility], [place] [address] [place], [place] [address]    Imaging / Studies: No results found.  Medications / Allergies: per chart  Antibiotics: Anti-infectives (From admission, onward)    Start     Dose/Rate Route Frequency Ordered Stop   08/18/21 2000  cefoTEtan (CEFOTAN) 2 g in sodium chloride 0.9 % 100 mL IVPB        2 g 200 mL/hr over 30 Minutes Intravenous Every 12 hours 08/18/21 1323 08/19/21 0049   08/18/21 0600  cefoTEtan (CEFOTAN) 2 g in sodium chloride 0.9 % 100 mL IVPB        2 g 200 mL/hr over 30 Minutes Intravenous On call to O.R. 08/18/21 0528 08/18/21 0800         Note: Portions of this report may have been transcribed using voice recognition software. Every effort was made to ensure accuracy; however, inadvertent computerized transcription errors may be present.   Any transcriptional errors that result from this process are unintentional.    Adin Hector, MD, FACS, MASCRS Esophageal, Gastrointestinal & Colorectal Surgery Robotic and Minimally Invasive Surgery  [facility],  [place]. [address] [place] [address] [place], [place] [address] [phone] Fax [phone] Main  CONTACT INFORMATION:  Weekday (9AM-5PM): Call [facility] main office at [phone]  Weeknight (5PM-9AM) or Weekend/Holiday: Check [email or web address] (password " [email or web address]") for General Surgery [facility] coverage  (Please, do not use SecureChat as it is not reliable communication to operating surgeons for immediate patient care)      08/21/2021  7:32 AM

## 2021-08-22 LAB — CREATININE, SERUM
Creatinine, Ser: 1.86 mg/dL — ABNORMAL HIGH (ref 0.61–1.24)
GFR, Estimated: 38 mL/min — ABNORMAL LOW (ref >60)

## 2021-08-22 LAB — POTASSIUM: Potassium: 4.6 mmol/L (ref 3.5–5.1)

## 2021-08-22 LAB — HEMOGLOBIN: Hemoglobin: 13.1 g/dL (ref 13.0–17.0)

## 2021-08-22 MED ORDER — METOPROLOL TARTRATE 5 MG/5ML IV SOLN: 5.0000 mg | Freq: Four times a day (QID) | INTRAVENOUS | Status: DC | PRN

## 2021-08-22 MED ORDER — HYDRALAZINE HCL 20 MG/ML IJ SOLN: 5.0000 mg | INTRAMUSCULAR | Status: DC | PRN

## 2021-08-22 NOTE — Progress Notes (Addendum)
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   Assessment  Slowly improving.  [facility] Stay = 4 days)  Plan:  -Tolerated clears.  Advance to full/dysphagia 1 diet.  -Scheduled simethicone and some nausea meds to help out.  -Follow-up on pathology.  -Resume H2 blockers with his bloating and discomfort.  -Has chronic kidney disease but is not oliguric.  Medlock fluids with IV bolus backup.  Continue to follow creatinine and potassium closely  -Fiber bowel regimen hopefully to encourage things to move forward.  -Hypertension control with Coreg and amlodipine.  Still elevated so add on backup metoprolol/hydralazine just in case.  -Hypercholesterolemia controlled  -Reactive airway disease.  Inhalers.  -VTE prophylaxis- SCDs, etc  -mobilize as  tolerated to help recovery.  Patient starting to walk more hopeful sign.  Hold off on PT/OT unless not improved by tomorrow defer to Dr. Marcello Moores.    Disposition:  Disposition:  The patient is from: Home  Anticipate discharge to:  Home with Home Health  Anticipated Date of Discharge is:  July 4,2023    Barriers to discharge:  Pending Clinical improvement (more likely than not)  Patient currently is NOT MEDICALLY STABLE for discharge from the hospital from a surgery standpoint.      I reviewed nursing notes, last 24 h vitals and pain scores, last 48 h intake and output, last 24 h labs and trends, and last 24 h imaging results. I have reviewed this patient's available data, including medical history, events of note, test results, etc as part of my evaluation.  A significant portion of that time was spent in counseling.  Care during the described time interval was provided by me.  This care required moderate level of medical decision making.  08/22/2021    Subjective: (Chief complaint)  Tolerated clears.  Less bloating and crampiness.  Passing gas but no bowel movement.  Walked in the hallways twice.  Objective:  Vital signs:  Vitals:   08/21/21 1356 08/21/21 2132 08/22/21 0500 08/22/21 0527  BP: (!) 148/88 (!) 154/87  (!) 152/87  Pulse: 95 92  85  Resp: '19 18  18  '$ Temp: 97.9 F (36.6 C) 97.6 F (36.4 C)  98.7 F (37.1 C)  TempSrc: Oral Oral    SpO2: 97% 96%  99%  Weight:   78.6 kg   Height:        Last BM Date :  08/21/21  Intake/Output   Yesterday:  07/01 0701 - 07/02 0700 In: 1899.3 [P.O.:600; I.V.:1299.3] Out: 900 [Urine:900] This shift:  No intake/output data recorded.  Bowel function:  Flatus: YES  BM:  YES  Drain: (No drain)   Physical Exam:  General: Pt awake/alert in no acute distress Eyes: PERRL, normal EOM.  Sclera clear.  No icterus Neuro: CN II-XII intact w/o focal sensory/motor deficits. Lymph: No head/neck/groin  lymphadenopathy Psych:  No delerium/psychosis/paranoia.  Oriented x 4 HENT: Normocephalic, Mucus membranes moist.  No thrush Neck: Supple, No tracheal deviation.  No obvious thyromegaly Chest: No pain to chest wall compression.  Good respiratory excursion.  No audible wheezing CV:  Pulses intact.  Regular rhythm.  No major extremity edema MS: Normal AROM mjr joints.  No obvious deformity  Abdomen: Soft.  Moderately distended.  Mildly tender at incisions only.  No evidence of peritonitis.  No incarcerated hernias.  Ext:   No deformity.  No mjr edema.  No cyanosis Skin: No petechiae / purpurea.  No major sores.  Warm and dry    Results:   Cultures: No results found for this or any previous visit (from the past 720 hour(s)).  Labs: Results for orders placed or performed during the hospital encounter of 08/18/21 (from the past 48 hour(s))  CBC     Status: Abnormal   Collection Time: 08/21/21  5:17 AM  Result Value Ref Range   WBC 10.8 (H) 4.0 - 10.5 K/uL   RBC 4.16 (L) 4.22 - 5.81 MIL/uL   Hemoglobin 12.7 (L) 13.0 - 17.0 g/dL   HCT 39.2 39.0 - 52.0 %   MCV 94.2 80.0 - 100.0 fL   MCH 30.5 26.0 - 34.0 pg   MCHC 32.4 30.0 - 36.0 g/dL   RDW 18.6 (H) 11.5 - 15.5 %   Platelets 224 150 - 400 K/uL   nRBC 0.0 0.0 - 0.2 %    Comment: Performed at [facility], [place] [address], [place], [place] [address]  Basic metabolic panel     Status: Abnormal   Collection Time: 08/21/21  5:17 AM  Result Value Ref Range   Sodium 136 135 - 145 mmol/L   Potassium 4.5 3.5 - 5.1 mmol/L   Chloride 107 98 - 111 mmol/L   CO2 24 22 - 32 mmol/L   Glucose, Bld 125 (H) 70 - 99 mg/dL    Comment: Glucose reference range applies only to samples taken after fasting for at least 8 hours.   BUN 22 8 - 23 mg/dL   Creatinine, Ser 1.61 (H) 0.61 - 1.24 mg/dL   Calcium 9.8 8.9 - 10.3 mg/dL   GFR, Estimated 46 (L) >60 mL/min    Comment: (NOTE) Calculated using the CKD-EPI Creatinine Equation  (2021)    Anion gap 5 5 - 15    Comment: Performed at [facility], [place] [address], [place], [place] [address]  Hemoglobin     Status: None   Collection Time: 08/22/21 12:39 AM  Result Value Ref Range   Hemoglobin 13.1 13.0 - 17.0 g/dL    Comment: Performed at [facility], [place] [address] [place], [place] [address]  Potassium     Status: None   Collection Time: 08/22/21 12:39 AM  Result Value Ref Range   Potassium 4.6 3.5 - 5.1 mmol/L    Comment: Performed at [facility], [place] [address] [place], [place] [address]  Creatinine, serum     Status:  Abnormal   Collection Time: 08/22/21 12:39 AM  Result Value Ref Range   Creatinine, Ser 1.86 (H) 0.61 - 1.24 mg/dL   GFR, Estimated 38 (L) >60 mL/min    Comment: (NOTE) Calculated using the CKD-EPI Creatinine Equation (2021) Performed at [facility], [place] [address] [place], [place] [address]     Imaging / Studies: No results found.  Medications / Allergies: per chart  Antibiotics: Anti-infectives (From admission, onward)    Start     Dose/Rate Route Frequency Ordered Stop   08/18/21 2000  cefoTEtan (CEFOTAN) 2 g in sodium chloride 0.9 % 100 mL IVPB        2 g 200 mL/hr over 30 Minutes Intravenous Every 12 hours 08/18/21 1323 08/19/21 0049   08/18/21 0600  cefoTEtan (CEFOTAN) 2 g in sodium chloride 0.9 % 100 mL IVPB        2 g 200 mL/hr over 30 Minutes Intravenous On call to O.R. 08/18/21 0528 08/18/21 0800         Note: Portions of this report may have been transcribed using voice recognition software. Every effort was made to ensure accuracy; however, inadvertent computerized transcription errors may be present.   Any transcriptional errors that result from this process are unintentional.    Adin Hector, MD, FACS, MASCRS Esophageal, Gastrointestinal & Colorectal Surgery Robotic and Minimally Invasive Surgery  [facility], [place]  [place]. [address], [place], [place] [address] [phone] Fax [phone] Main  CONTACT INFORMATION:  Weekday (9AM-5PM): Call [facility] main office at [phone]  Weeknight (5PM-9AM) or Weekend/Holiday: Check [email or web address] (password " [email or web address]") for General Surgery [facility] coverage  (Please, do not use SecureChat as it is not reliable communication to operating surgeons for immediate patient care)      08/22/2021  8:28 AM

## 2021-08-23 LAB — CREATININE, SERUM
Creatinine, Ser: 1.77 mg/dL — ABNORMAL HIGH (ref 0.61–1.24)
GFR, Estimated: 41 mL/min — ABNORMAL LOW (ref >60)

## 2021-08-23 LAB — POTASSIUM: Potassium: 4.6 mmol/L (ref 3.5–5.1)

## 2021-08-23 NOTE — Care Management Important Message (Signed)
Important Message  Patient Details IM Letter given to the Patient. Name: Fernando Dean MRN: [id number] Date of Birth: [birth year]   Medicare Important Message Given:  Yes     Kerin Salen 08/23/2021, 4:18 PM

## 2021-08-23 NOTE — Progress Notes (Signed)
5 Days Post-Op Robotic R colectomy  Subjective: Having bowel function no further nausea, tolerating full liquids, pain ok  Objective: Vital signs in last 24 hours: Temp:  [97.5 F (36.4 C)] 97.5 F (36.4 C) (07/03 0606) Pulse Rate:  [75-82] 78 (07/03 0606) Resp:  [16-18] 16 (07/03 0606) BP: (136-161)/(87-97) 136/87 (07/03 0606) SpO2:  [99 %-100 %] 99 % (07/03 0606)   Intake/Output from previous day: 07/02 0701 - 07/03 0700 In: 960 [P.O.:960] Out: -  Intake/Output this shift: No intake/output data recorded.   General appearance: alert and cooperative GI: normal findings: soft, nondistended  Incision: no significant drainage  Lab Results:  Recent Labs    08/21/21 0517 08/22/21 0039  WBC 10.8*  --   HGB 12.7* 13.1  HCT 39.2  --   PLT 224  --     BMET Recent Labs    08/21/21 0517 08/22/21 0039 08/23/21 0414  NA 136  --   --   K 4.5 4.6 4.6  CL 107  --   --   CO2 24  --   --   GLUCOSE 125*  --   --   BUN 22  --   --   CREATININE 1.61* 1.86* 1.77*  CALCIUM 9.8  --   --     PT/INR No results for input(s): "LABPROT", "INR" in the last 72 hours. ABG No results for input(s): "PHART", "HCO3" in the last 72 hours.  Invalid input(s): "PCO2", "PO2"  MEDS, Scheduled  acetaminophen  1,000 mg Oral Q6H   amLODipine  10 mg Oral Daily   carvedilol  6.25 mg Oral BID WC   diphenhydrAMINE  25 mg Oral TID   enoxaparin (LOVENOX) injection  40 mg Subcutaneous Q24H   famotidine  20 mg Oral BID   feeding supplement  237 mL Oral BID BM   gabapentin  100 mg Oral TID   lip balm   Topical BID   multivitamin with minerals  1 tablet Oral Daily   polycarbophil  625 mg Oral BID   simethicone  80 mg Oral QID   simvastatin  20 mg Oral QHS    Studies/Results: No results found.  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 08/18/2021 08/18/2021   Abnormal PET scan of colon 06/21/2021   Family history of colon cancer 06/08/2021   Centrilobular emphysema ([place]) 05/18/2021   Microcytic anemia 05/18/2021   Cancer of left colon ([place]) 05/11/2021   Colonic mass s/p robotic splenic flexure resection 04/23/2021 04/23/2021   S/P AAA repair 02/26/2021   AAA (abdominal aortic aneurysm) without rupture ([place]) 02/16/2021   Complete heart block ([place]) 02/04/2021   Alcohol abuse 02/04/2021   Tobacco abuse 02/04/2021    Class: Chronic   CKD (chronic kidney disease), stage III ([place]) 02/04/2021   Essential hypertension 02/01/2021   Encounter for abdominal aortic aneurysm (AAA) screening 02/01/2021    Expected post op course  Plan: Advance diet to soft foods when ready Ambulate in hall   LOS: 5 days     .Rosario Adie, MD [facility], [place]    08/23/2021 8:21 AM

## 2021-08-24 MED ORDER — LOPERAMIDE HCL 2 MG PO TABS: 2.0000 mg | ORAL_TABLET | Freq: Four times a day (QID) | ORAL | 0 refills | Status: AC | PRN

## 2021-08-24 MED ORDER — OXYCODONE HCL 5 MG PO TABS: 5.0000 mg | ORAL_TABLET | Freq: Four times a day (QID) | ORAL | 0 refills | Status: DC | PRN

## 2021-08-24 NOTE — Discharge Summary (Signed)
Physician Discharge Summary  Patient ID: Fernando Dean MRN: [id number] DOB/AGE: 71-Sep-1952 71 y.o.  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  Diet was advanced as tolerated.  Patient began to have bowel function on postop day 1, but then developed an ileus.  This slowly resolved over the next couple days.  By postop day 6, He was tolerating a solid diet and pain was controlled with oral medications.  He was urinating without difficulty and ambulating without assistance.  Patient was felt to be in stable condition for discharge to home.   Consults: None  Significant Diagnostic Studies: labs: cbc, bmet  Treatments: IV hydration, analgesia: acetaminophen, and surgery: robotic R colectomy  Discharge Exam: Blood pressure 127/84, pulse 84, temperature 98.1 F (36.7 C), temperature source Oral, resp. rate 18, height 6' (1.829 m), weight 78.4 kg, SpO2 99 %. General appearance: alert and cooperative GI: soft, non-distended Incision/Wound: clean, dry, intact  Disposition: Discharge disposition: 01-Home or Self Care        Allergies as of 08/24/2021       Reactions   Lisinopril Anaphylaxis   Naproxen Shortness Of Breath, Palpitations   Nsaids Shortness Of Breath, Palpitations        Medication List     STOP taking these medications    diphenhydrAMINE 25 MG tablet Commonly known as: BENADRYL       TAKE these medications    albuterol 108 (90 Base) MCG/ACT inhaler Commonly known as: VENTOLIN HFA Inhale 2 puffs into the lungs every  6 (six) hours as needed for wheezing or shortness of breath.   amLODipine 10 MG tablet Commonly known as: NORVASC Take 1 tablet (10 mg total) by mouth daily.   aspirin EC 81 MG tablet Take 1 tablet (81 mg total) by mouth daily at 6 (six) AM. Swallow whole.   atorvastatin 40 MG tablet Commonly known as: LIPITOR Take 1 tablet (40 mg total) by mouth daily.   bisacodyl 5 MG EC tablet Commonly known as: DULCOLAX Take 5 mg by mouth daily as needed for moderate constipation.   carvedilol 6.25 MG tablet Commonly known as: COREG Take 1 tablet (6.25 mg total) by mouth 2 (two) times daily with a meal.   famotidine 20 MG tablet Commonly known as: PEPCID Take 1 tablet (20 mg total) by mouth 2 (two) times daily for 2 days.   loperamide 2 MG tablet Commonly known as: Imodium A-D Take 1 tablet (2 mg total) by mouth 4 (four) times daily as needed for diarrhea or loose stools.   multivitamin with minerals tablet Take 1 tablet by mouth daily. Centrum Silver for Men 50+   nicotine 21 mg/24hr patch Commonly known as: NICODERM CQ - dosed in mg/24 hours Place 21 mg onto the skin daily as needed (nicotine cravings).   oxyCODONE 5 MG immediate release tablet Commonly known as: Oxy IR/ROXICODONE Take 1 tablet (5 mg total) by mouth every 6 (six) hours as needed for moderate pain.   simvastatin 20 MG tablet Commonly known as: ZOCOR Take 20 mg by mouth at bedtime.  Follow-up Information     Leighton Ruff, MD. Schedule an appointment as soon as possible for a visit in 2 week(s).   Specialties: General Surgery, Colon and Rectal Surgery Contact information: [place] [address] [phone]                 Signed: Rosario Adie 04/21/4968, 7:21 AM

## 2021-08-24 NOTE — Progress Notes (Signed)
Patient was given discharge instructions, and all questions were answered.  Patient was stable for discharge and was taken to the main exit by wheelchair. 

## 2021-08-25 ENCOUNTER — Other Ambulatory Visit: Payer: Self-pay

## 2021-08-25 NOTE — Progress Notes (Signed)
The proposed treatment discussed in conference is for discussion purpose only and is not a binding recommendation.  The patients have not been physically examined, or presented with their treatment options.  Therefore, final treatment plans cannot be decided.  

## 2021-08-26 NOTE — Progress Notes (Signed)
Remote pacemaker transmission.   

## 2021-09-02 DIAGNOSIS — E785 Hyperlipidemia, unspecified: Secondary | ICD-10-CM | POA: Diagnosis not present

## 2021-09-02 DIAGNOSIS — E875 Hyperkalemia: Secondary | ICD-10-CM | POA: Diagnosis not present

## 2021-09-03 ENCOUNTER — Encounter (HOSPITAL_COMMUNITY): Payer: Self-pay

## 2021-09-03 ENCOUNTER — Inpatient Hospital Stay (HOSPITAL_COMMUNITY): Payer: Medicare Other

## 2021-09-03 ENCOUNTER — Inpatient Hospital Stay (HOSPITAL_COMMUNITY)
Admission: EM | Admit: 2021-09-03 | Discharge: 2021-09-06 | DRG: 683 | Disposition: A | Discharge status: Home / Self Care | Payer: Medicare Other | Source: Ambulatory Visit | Attending: Internal Medicine | Admitting: Internal Medicine

## 2021-09-03 ENCOUNTER — Other Ambulatory Visit: Payer: Self-pay

## 2021-09-03 DIAGNOSIS — Z8611 Personal history of tuberculosis: Secondary | ICD-10-CM

## 2021-09-03 DIAGNOSIS — R778 Other specified abnormalities of plasma proteins: Secondary | ICD-10-CM

## 2021-09-03 DIAGNOSIS — N170 Acute kidney failure with tubular necrosis: Principal | ICD-10-CM | POA: Diagnosis present

## 2021-09-03 DIAGNOSIS — E871 Hypo-osmolality and hyponatremia: Secondary | ICD-10-CM | POA: Diagnosis not present

## 2021-09-03 DIAGNOSIS — I700 Atherosclerosis of aorta: Secondary | ICD-10-CM | POA: Diagnosis not present

## 2021-09-03 DIAGNOSIS — Z800 Family history of malignant neoplasm of digestive organs: Secondary | ICD-10-CM

## 2021-09-03 DIAGNOSIS — Z87892 Personal history of anaphylaxis: Secondary | ICD-10-CM | POA: Diagnosis not present

## 2021-09-03 DIAGNOSIS — Z79899 Other long term (current) drug therapy: Secondary | ICD-10-CM | POA: Diagnosis not present

## 2021-09-03 DIAGNOSIS — Z9049 Acquired absence of other specified parts of digestive tract: Secondary | ICD-10-CM | POA: Diagnosis not present

## 2021-09-03 DIAGNOSIS — Z888 Allergy status to other drugs, medicaments and biological substances status: Secondary | ICD-10-CM

## 2021-09-03 DIAGNOSIS — Z8249 Family history of ischemic heart disease and other diseases of the circulatory system: Secondary | ICD-10-CM | POA: Diagnosis not present

## 2021-09-03 DIAGNOSIS — Z7982 Long term (current) use of aspirin: Secondary | ICD-10-CM | POA: Diagnosis not present

## 2021-09-03 DIAGNOSIS — F17210 Nicotine dependence, cigarettes, uncomplicated: Secondary | ICD-10-CM | POA: Diagnosis present

## 2021-09-03 DIAGNOSIS — C183 Malignant neoplasm of hepatic flexure: Secondary | ICD-10-CM | POA: Diagnosis not present

## 2021-09-03 DIAGNOSIS — N179 Acute kidney failure, unspecified: Secondary | ICD-10-CM | POA: Diagnosis not present

## 2021-09-03 DIAGNOSIS — I10 Essential (primary) hypertension: Secondary | ICD-10-CM | POA: Diagnosis not present

## 2021-09-03 DIAGNOSIS — Z950 Presence of cardiac pacemaker: Secondary | ICD-10-CM | POA: Diagnosis not present

## 2021-09-03 DIAGNOSIS — E869 Volume depletion, unspecified: Secondary | ICD-10-CM | POA: Diagnosis not present

## 2021-09-03 DIAGNOSIS — N1832 Chronic kidney disease, stage 3b: Secondary | ICD-10-CM | POA: Diagnosis not present

## 2021-09-03 DIAGNOSIS — I129 Hypertensive chronic kidney disease with stage 1 through stage 4 chronic kidney disease, or unspecified chronic kidney disease: Secondary | ICD-10-CM | POA: Diagnosis not present

## 2021-09-03 DIAGNOSIS — N261 Atrophy of kidney (terminal): Secondary | ICD-10-CM | POA: Diagnosis not present

## 2021-09-03 DIAGNOSIS — E875 Hyperkalemia: Secondary | ICD-10-CM | POA: Diagnosis not present

## 2021-09-03 DIAGNOSIS — I442 Atrioventricular block, complete: Secondary | ICD-10-CM | POA: Diagnosis not present

## 2021-09-03 DIAGNOSIS — E872 Acidosis, unspecified: Secondary | ICD-10-CM | POA: Diagnosis present

## 2021-09-03 DIAGNOSIS — D649 Anemia, unspecified: Secondary | ICD-10-CM | POA: Diagnosis not present

## 2021-09-03 DIAGNOSIS — E861 Hypovolemia: Secondary | ICD-10-CM | POA: Diagnosis present

## 2021-09-03 DIAGNOSIS — D75839 Thrombocytosis, unspecified: Secondary | ICD-10-CM | POA: Diagnosis not present

## 2021-09-03 DIAGNOSIS — I248 Other forms of acute ischemic heart disease: Secondary | ICD-10-CM | POA: Diagnosis present

## 2021-09-03 DIAGNOSIS — Z85038 Personal history of other malignant neoplasm of large intestine: Secondary | ICD-10-CM | POA: Diagnosis not present

## 2021-09-03 DIAGNOSIS — N189 Chronic kidney disease, unspecified: Secondary | ICD-10-CM

## 2021-09-03 DIAGNOSIS — E860 Dehydration: Secondary | ICD-10-CM | POA: Diagnosis not present

## 2021-09-03 DIAGNOSIS — N289 Disorder of kidney and ureter, unspecified: Secondary | ICD-10-CM | POA: Diagnosis not present

## 2021-09-03 DIAGNOSIS — Z9889 Other specified postprocedural states: Secondary | ICD-10-CM | POA: Diagnosis not present

## 2021-09-03 DIAGNOSIS — N281 Cyst of kidney, acquired: Secondary | ICD-10-CM | POA: Diagnosis not present

## 2021-09-03 LAB — URINALYSIS, ROUTINE W REFLEX MICROSCOPIC
Bilirubin Urine: NEGATIVE
Glucose, UA: NEGATIVE mg/dL
Hgb urine dipstick: NEGATIVE
Ketones, ur: NEGATIVE mg/dL
Leukocytes,Ua: NEGATIVE
Nitrite: NEGATIVE
Protein, ur: 30 mg/dL — AB
Specific Gravity, Urine: 1.014 (ref 1.005–1.030)
pH: 5 (ref 5.0–8.0)

## 2021-09-03 LAB — COMPREHENSIVE METABOLIC PANEL
ALT: 20 U/L (ref 0–44)
AST: 13 U/L — ABNORMAL LOW (ref 15–41)
Albumin: 4.3 g/dL (ref 3.5–5.0)
Alkaline Phosphatase: 148 U/L — ABNORMAL HIGH (ref 38–126)
Anion gap: 20 — ABNORMAL HIGH (ref 5–15)
BUN: 138 mg/dL — ABNORMAL HIGH (ref 8–23)
CO2: 16 mmol/L — ABNORMAL LOW (ref 22–32)
Calcium: 10.7 mg/dL — ABNORMAL HIGH (ref 8.9–10.3)
Chloride: 94 mmol/L — ABNORMAL LOW (ref 98–111)
Creatinine, Ser: 8.08 mg/dL — ABNORMAL HIGH (ref 0.61–1.24)
GFR, Estimated: 7 mL/min — ABNORMAL LOW (ref >60)
Glucose, Bld: 133 mg/dL — ABNORMAL HIGH (ref 70–99)
Potassium: 5.7 mmol/L — ABNORMAL HIGH (ref 3.5–5.1)
Sodium: 130 mmol/L — ABNORMAL LOW (ref 135–145)
Total Bilirubin: 0.6 mg/dL (ref 0.3–1.2)
Total Protein: 9.7 g/dL — ABNORMAL HIGH (ref 6.5–8.1)

## 2021-09-03 LAB — BLOOD GAS, VENOUS
Acid-base deficit: 10.2 mmol/L — ABNORMAL HIGH (ref 0.0–2.0)
Bicarbonate: 15.5 mmol/L — ABNORMAL LOW (ref 20.0–28.0)
Drawn by: 7049
FIO2: 21 %
O2 Saturation: 45.7 %
Patient temperature: 36.3
pCO2, Ven: 32 mmHg — ABNORMAL LOW (ref 44–60)
pH, Ven: 7.29 (ref 7.25–7.43)
pO2, Ven: <31 mmHg — CL (ref 32–45)

## 2021-09-03 LAB — CBC WITH DIFFERENTIAL/PLATELET
Abs Immature Granulocytes: 0.05 10*3/uL (ref 0.00–0.07)
Basophils Absolute: 0 10*3/uL (ref 0.0–0.1)
Basophils Relative: 0 %
Eosinophils Absolute: 0.1 10*3/uL (ref 0.0–0.5)
Eosinophils Relative: 1 %
HCT: 42.1 % (ref 39.0–52.0)
Hemoglobin: 14.3 g/dL (ref 13.0–17.0)
Immature Granulocytes: 1 %
Lymphocytes Relative: 35 %
Lymphs Abs: 2.7 10*3/uL (ref 0.7–4.0)
MCH: 30.7 pg (ref 26.0–34.0)
MCHC: 34 g/dL (ref 30.0–36.0)
MCV: 90.3 fL (ref 80.0–100.0)
Monocytes Absolute: 0.6 10*3/uL (ref 0.1–1.0)
Monocytes Relative: 8 %
Neutro Abs: 4.2 10*3/uL (ref 1.7–7.7)
Neutrophils Relative %: 55 %
Platelets: 415 10*3/uL — ABNORMAL HIGH (ref 150–400)
RBC: 4.66 MIL/uL (ref 4.22–5.81)
RDW: 15.8 % — ABNORMAL HIGH (ref 11.5–15.5)
WBC: 7.6 10*3/uL (ref 4.0–10.5)
nRBC: 0 % (ref 0.0–0.2)

## 2021-09-03 LAB — TROPONIN I (HIGH SENSITIVITY): Troponin I (High Sensitivity): 29 ng/L — ABNORMAL HIGH (ref <18)

## 2021-09-03 MED ORDER — LACTATED RINGERS IV BOLUS
1000.0000 mL | Freq: Once | INTRAVENOUS | Status: AC
  Administered 2021-09-03: 1000 mL via INTRAVENOUS

## 2021-09-03 MED ORDER — LACTATED RINGERS IV SOLN: INTRAVENOUS | Status: DC

## 2021-09-03 MED ORDER — SODIUM ZIRCONIUM CYCLOSILICATE 10 G PO PACK
10.0000 g | PACK | Freq: Every day | ORAL | Status: DC
  Administered 2021-09-04 – 2021-09-05 (×2): 10 g via ORAL
  Filled 2021-09-03 (×2): qty 1

## 2021-09-03 NOTE — H&P (Signed)
History and Physical    Patient: Fernando Dean:[id number] DOB: Nov 08, 1950 DOA: 09/03/2021 DOS: the patient was seen and examined on 09/04/2021 PCP: [name], FNP  Patient coming from: Home   Chief Complaint:  Chief Complaint  Patient presents with   Dehydration   HPI: Fernando Dean is a 71 y.o. 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  Patient was unable to provide a history, history was obtained from ED physician and family at bedside.  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  Niece at bedside states that patient was doing well after the surgery and for about 4 to 5 days after the surgery, but after this, patient barely eats due to lack of appetite and fluid intake was also greatly reduced.  Urinary output has decreased tremendously especially in the last 2 to 3 days whereby he has not urinated.  He went to his PCP, lab work was done and he was told to go to the ED for further evaluation and management based on abnormal labs.  Patient denies chest pain, abdominal pain, shortness of breath, fever, headache.  ED Course:  In the emergency department, he was intermittently tachypneic, BP was 122/75, other vital signs were within normal range.  Work-up in the ED showed normal CBC except for thrombocytosis, BMP showed sodium 130, potassium 4.7, bicarb 16, glucose 133, BUN 138, creatinine 8.08, calcium 10.7, ALP 148, EGFR 7, anion gap 20, troponin x1- 29, urinalysis was unimpressive for UTI. IV hydration was provided, Milly Jakob was given.  Nephrologist (Dr. Johnney Ou) was consulted by ED physician and recommended admitting  patient with plan to follow-up with patient in the morning.  Review of Systems: Review of systems as noted in the HPI. All other systems reviewed and are negative.   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 - [employer] MRI 2272 dual chamber PPM   Tuberculosis    as a child, was treated   Past Surgical History:  Procedure Laterality Date   ABDOMINAL AORTIC ENDOVASCULAR STENT GRAFT Bilateral 02/26/2021   Procedure: ABDOMINAL AORTIC ENDOVASCULAR STENT GRAFT REPAIR;  Surgeon: Marty Heck, MD;  Location: [facility] OR;  Service: Vascular;  Laterality: Bilateral;   BIOPSY  03/12/2021   Procedure: BIOPSY;  Surgeon: Harvel Quale, MD;  Location: [facility];  Service: Gastroenterology;;   BIOPSY  06/25/2021   Procedure: BIOPSY;  Surgeon: Harvel Quale, MD;  Location: [facility];  Service: Gastroenterology;;  mass    COLONOSCOPY WITH PROPOFOL N/A 03/12/2021   Procedure: COLONOSCOPY WITH PROPOFOL;  Surgeon: Harvel Quale, MD;  Location: [facility];  Service: Gastroenterology;  Laterality: N/A;  10:55   COLONOSCOPY WITH PROPOFOL N/A 06/25/2021   Procedure: COLONOSCOPY WITH PROPOFOL;  Surgeon: Harvel Quale, MD;  Location: [facility];  Service: Gastroenterology;  Laterality: N/A;  235 ASA 2   HEMOSTASIS CLIP PLACEMENT  03/12/2021   Procedure: HEMOSTASIS CLIP PLACEMENT;  Surgeon: Harvel Quale, MD;  [facility]: [facility];  Service: Gastroenterology;;   HEMOSTASIS CLIP PLACEMENT  06/25/2021   Procedure: HEMOSTASIS CLIP PLACEMENT;  Surgeon: Harvel Quale, MD;  [facility];  Service: Gastroenterology;;   HERNIA REPAIR Left 10/25/2006    PACEMAKER IMPLANT N/A 02/08/2021   Procedure: PACEMAKER IMPLANT;  Surgeon: Deboraha Sprang, MD;  Location: [facility];  Service: Cardiovascular;  Laterality: N/A;   POLYPECTOMY  03/12/2021   Procedure: POLYPECTOMY;  Surgeon: Harvel Quale, MD;  [facility];  Service: Gastroenterology;;   POLYPECTOMY  06/25/2021   Procedure: POLYPECTOMY;  Surgeon: Harvel Quale, MD;  [facility];  Service: Gastroenterology;;   [name]  03/12/2021   Procedure: [name];  Surgeon: [name], [name], MD;  Location: [facility];  Service: Gastroenterology;;    Social History:  reports that he has been smoking cigarettes. He has a 37.50 pack-year smoking history. He has been exposed to tobacco smoke. He has never used smokeless tobacco. He reports current alcohol use of about 2.0 - 3.0 standard drinks of alcohol per week.  Drug: Marijuana.   Allergies  Allergen Reactions   Lisinopril Anaphylaxis   Naproxen Shortness Of Breath and Palpitations   Nsaids Shortness Of Breath and Palpitations    Family History  Problem Relation Age of Onset   Heart disease Father    Colon polyps Sister        less than 10 lifetime polyps   Colon cancer Maternal Grandmother        dx 90s     Prior to Admission medications   Medication Sig Start Date End Date Taking? Authorizing Provider  albuterol (VENTOLIN HFA) 108 (90 Base) MCG/ACT inhaler Inhale 2 puffs into the lungs every 6 (six) hours as needed for wheezing or shortness of breath. 05/18/21   Lindell Spar, MD  amLODipine (NORVASC) 10 MG tablet Take 1 tablet (10 mg total) by mouth daily. 06/29/21   Carmin Muskrat, MD  aspirin EC 81 MG EC tablet Take 1 tablet (81 mg total) by mouth daily at 6 (six) AM. Swallow whole. 02/28/21   Dagoberto Ligas, PA-C  atorvastatin (LIPITOR) 40 MG tablet Take 1 tablet (40 mg total) by mouth daily. 03/25/21 07/27/21  Early Osmond, MD  bisacodyl (DULCOLAX) 5 MG EC tablet  Take 5 mg by mouth daily as needed for moderate constipation.    [provider]  carvedilol (COREG) 6.25 MG tablet Take 1 tablet (6.25 mg total) by mouth 2 (two) times daily with a meal. 06/29/21   Carmin Muskrat, MD  famotidine (PEPCID) 20 MG tablet Take 1 tablet (20 mg total) by mouth 2 (two) times daily for 2 days. Patient not taking: Reported on 07/09/2021 06/29/21 07/01/21  Carmin Muskrat, MD  loperamide (IMODIUM A-D) 2 MG tablet Take 1 tablet (2 mg total) by mouth 4 (four) times daily as needed for diarrhea or loose stools. 04/01/39   Leighton Ruff, MD  Multiple Vitamins-Minerals (MULTIVITAMIN WITH MINERALS) tablet Take 1 tablet by mouth daily. Centrum Silver for Men 50+    [provider]  nicotine (NICODERM CQ - DOSED IN MG/24 HOURS) 21 mg/24hr patch Place 21 mg onto the skin daily as needed (nicotine cravings). 02/18/21   [provider]  oxyCODONE (OXY IR/ROXICODONE) 5 MG immediate release tablet Take 1 tablet (5 mg total) by mouth  every 6 (six) hours as needed for moderate pain. 6/0/73   Leighton Ruff, MD  simvastatin (ZOCOR) 20 MG tablet Take 20 mg by mouth at bedtime. 05/29/21   [provider]    Physical Exam: BP (!) 152/85 (BP Location: Right Arm)   Pulse 82   Temp (!) 97.5 F (36.4 C) (Oral)   Resp (!) 22   Ht 6' (1.829 m)   Wt 71.7 kg   SpO2 100%   BMI 21.44 kg/m   General: 71 y.o. year-old male ill appearing, but in no acute distress.  Alert and oriented x3. HEENT: NCAT, EOMI, dry mucous membranes Neck: Supple, trachea medial Cardiovascular: Regular rate and rhythm with no rubs or gallops.  No thyromegaly or JVD noted.  No lower extremity edema. 2/4 pulses in all 4 extremities. Respiratory: Tachypnea.  Clear to auscultation with no wheezes or rales. Good inspiratory effort. Abdomen: Soft, nontender nondistended with normal bowel sounds x4 quadrants. Muskuloskeletal: No cyanosis, clubbing or edema noted bilaterally Neuro: CN II-XII  intact, strength 5/5 x 4, sensation, reflexes intact Skin: No ulcerative lesions noted or rashes Psychiatry: Mood is appropriate for condition and setting          Labs on Admission:  Basic Metabolic Panel: Recent Labs  Lab 09/03/21 1934  NA 130*  K 5.7*  CL 94*  CO2 16*  GLUCOSE 133*  BUN 138*  CREATININE 8.08*  CALCIUM 10.7*   Liver Function Tests: Recent Labs  Lab 09/03/21 1934  AST 13*  ALT 20  ALKPHOS 148*  BILITOT 0.6  PROT 9.7*  ALBUMIN 4.3   No results for input(s): "LIPASE", "AMYLASE" in the last 168 hours. No results for input(s): "AMMONIA" in the last 168 hours. CBC: Recent Labs  Lab 09/03/21 1934  WBC 7.6  NEUTROABS 4.2  HGB 14.3  HCT 42.1  MCV 90.3  PLT 415*   Cardiac Enzymes: No results for input(s): "CKTOTAL", "CKMB", "CKMBINDEX", "TROPONINI" in the last 168 hours.  BNP (last 3 results) No results for input(s): "BNP" in the last 8760 hours.  ProBNP (last 3 results) No results for input(s): "PROBNP" in the last 8760 hours.  CBG: No results for input(s): "GLUCAP" in the last 168 hours.  Radiological Exams on Admission: No results found.  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/2021   Dehydration   Hyponatremia   Hyperkalemia   Thrombocytosis   Elevated troponin   Acute kidney injury on CKD stage 3B Dehydration BUN 138, creatinine 8.08 (baseline creatinine at 1.5-1.8) Continue IV hydration Renally adjust medications, avoid nephrotoxic agents/dehydration/hypotension Nephrologist was consulted and will follow-up with patient in  the morning per ED physician  Hyponatremia possibly due to dehydration sodium 130, Continue IV hydration  Hyperkalemia K+ 5.7, Lokelma was given Continue to monitor potassium level  Thrombocytosis possibly reactive Platelets 415, continue to monitor platelet levels with morning labs  Hypercalcemia Calcium 10.7, continue IV hydration  Chronically elevated troponin possibly secondary to type II demand ischemia Troponin x1 - 29, patient denies any chest pain, this was 36 about 7 months ago  Complete heart block s/p pulmonary  pacemaker placement Stable  Prolonged QT interval QTc 530 ms; patient is ventricularly paced   DVT prophylaxis: Heparin subcu  Code Status: Full code  Consults: Nephrology  Family Communication: Family at bedside (all questions answered to satisfaction)  Severity of Illness: The appropriate patient status for this patient is INPATIENT. Inpatient status is judged to be reasonable and necessary in order to provide the required intensity of service to ensure the patient's safety. The patient's presenting symptoms, physical exam findings, and initial radiographic and laboratory data in the context of their chronic comorbidities is felt to place them at high risk for further clinical deterioration. Furthermore, it is not anticipated that the patient will be medically stable for discharge from the hospital within 2 midnights of admission.   * I certify that at the point of admission it is my clinical judgment that the patient will require inpatient hospital care spanning beyond 2 midnights from the point of admission due to high intensity of service, high risk for further deterioration and high frequency of surveillance required.*  Author: Bernadette Hoit, DO 09/04/2021 3:53 AM  For on call review [email or web address].

## 2021-09-03 NOTE — ED Triage Notes (Signed)
Pt brought by family, states that pt has not been eating or drinking well post colon procedure on 6/28, was advised by pcp to come here due to abnormal lab work

## 2021-09-03 NOTE — ED Provider Notes (Signed)
[facility] Provider Note  CSN: [id number] Arrival date & time: 09/03/21 7124  Chief Complaint(s) Dehydration  HPI Fernando Dean is a 71 y.o. male with PMH colon cancer status post robotic right colectomy on 08/18/2021, complete heart block, CKD 3 who presents emergency department for evaluation of dehydration and abnormal labs.  Family states that the patient has had significant difficulty tolerating p.o. as of recently due to patient not wanting to eat or drink.  He states that he is not nauseous, having vomiting or diarrhea, he just does not feel the urge to eat.  He states over the last 2 to 3 days he has noticed he has not urinated.  He saw his primary care physician who obtained outpatient labs showing significant azotemia and sent him to the emergency department.  He denies abdominal pain, chest pain, shortness of breath, headache, fever or other systemic symptoms.   Past Medical History 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 - St Jude/[employer] MRI 2272 dual chamber PPM   Tuberculosis    as a child, was treated   Patient Active Problem List   Diagnosis Date Noted   Acute kidney injury ([place]) 09/03/2021   Personal history of left colon cancer 08/21/2021   Primary cancer of hepatic flexure s/p robotic right colectomy 08/18/2021 08/18/2021   Abnormal PET scan of colon 06/21/2021   Family history of colon cancer 06/08/2021   Centrilobular emphysema ([place]) 05/18/2021   Microcytic anemia 05/18/2021   Cancer of left colon ([place]) 05/11/2021   Colonic mass s/p robotic splenic flexure resection 04/23/2021 04/23/2021   S/P AAA repair 02/26/2021   AAA (abdominal aortic 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 Authorizing Provider  albuterol (VENTOLIN HFA) 108 (90 Base) MCG/ACT inhaler Inhale 2 puffs into the lungs every 6 (six) hours as needed for wheezing or shortness of breath. 05/18/21   Lindell Spar, MD  amLODipine (NORVASC) 10 MG tablet Take 1 tablet (10 mg total) by mouth daily. 06/29/21   Carmin Muskrat, MD  aspirin EC 81 MG EC tablet Take 1 tablet (81 mg total) by mouth daily at 6 (six) AM. Swallow whole. 02/28/21   Dagoberto Ligas, PA-C  atorvastatin (LIPITOR) 40 MG tablet Take 1 tablet (40 mg total) by mouth daily. 03/25/21 07/27/21  Early Osmond, MD  bisacodyl (DULCOLAX) 5 MG EC tablet Take 5 mg by mouth daily as needed for moderate constipation.    [provider]  carvedilol (COREG) 6.25 MG tablet Take 1 tablet (6.25 mg total) by mouth 2 (two) times daily with a meal. 06/29/21   Carmin Muskrat, MD  famotidine (PEPCID) 20 MG tablet Take 1 tablet (20 mg total) by mouth 2 (two) times daily for 2 days. Patient not taking: Reported on 07/09/2021 06/29/21 07/01/21  Vanita Panda,  Herbie Baltimore, MD  loperamide (IMODIUM A-D) 2 MG tablet Take 1 tablet (2 mg total) by mouth 4 (four) times daily as needed for diarrhea or loose stools. 0/8/14   Leighton Ruff, MD  Multiple Vitamins-Minerals (MULTIVITAMIN WITH MINERALS) tablet Take 1 tablet by mouth daily. Centrum Silver for Men 50+    [provider]  nicotine (NICODERM CQ - DOSED IN MG/24 HOURS) 21 mg/24hr patch Place 21 mg onto the skin daily as needed (nicotine cravings). 02/18/21   [provider]  oxyCODONE (OXY IR/ROXICODONE) 5 MG immediate release  tablet Take 1 tablet (5 mg total) by mouth every 6 (six) hours as needed for moderate pain. 05/29/16   Leighton Ruff, MD  simvastatin (ZOCOR) 20 MG tablet Take 20 mg by mouth at bedtime. 05/29/21   [provider]                                                                                                                                    Past Surgical History Past Surgical History:  Procedure Laterality Date   ABDOMINAL AORTIC ENDOVASCULAR STENT GRAFT Bilateral 02/26/2021   Procedure: ABDOMINAL AORTIC ENDOVASCULAR STENT GRAFT REPAIR;  Surgeon: Marty Heck, MD;  Location: [facility];  Service: Vascular;  Laterality: Bilateral;   BIOPSY  03/12/2021   Procedure: BIOPSY;  Surgeon: Harvel Quale, MD;  [facility];  Service: Gastroenterology;;   BIOPSY  06/25/2021   Procedure: BIOPSY;  Surgeon: Harvel Quale, MD;  [facility];  Service: Gastroenterology;;  mass    COLONOSCOPY WITH PROPOFOL N/A 03/12/2021   Procedure: COLONOSCOPY WITH PROPOFOL;  Surgeon: Harvel Quale, MD;  [facility];  Service: Gastroenterology;  Laterality: N/A;  10:55   COLONOSCOPY WITH PROPOFOL N/A 06/25/2021   Procedure: COLONOSCOPY WITH PROPOFOL;  Surgeon: Harvel Quale, MD;  [facility];  Service: Gastroenterology;  Laterality: N/A;  235 ASA 2   HEMOSTASIS CLIP PLACEMENT  03/12/2021   Procedure: HEMOSTASIS CLIP PLACEMENT;  Surgeon: Harvel Quale, MD;  [facility];  Service: Gastroenterology;;   HEMOSTASIS CLIP PLACEMENT  06/25/2021   Procedure: HEMOSTASIS CLIP PLACEMENT;  Surgeon: Harvel Quale, MD;  [facility];  Service: Gastroenterology;;   HERNIA REPAIR Left 10/25/2006   PACEMAKER IMPLANT N/A 02/08/2021   Procedure: PACEMAKER IMPLANT;  Surgeon: Deboraha Sprang, MD;  Location: [facility];  Service: Cardiovascular;  Laterality: N/A;   POLYPECTOMY  03/12/2021    Procedure: POLYPECTOMY;  Surgeon: Harvel Quale, MD;  [facility];  Service: Gastroenterology;;   POLYPECTOMY  06/25/2021   Procedure: POLYPECTOMY;  Surgeon: Harvel Quale, MD;  [facility];  Service: Gastroenterology;;   [name]  03/12/2021   Procedure: [name];  Surgeon: [name], [name], MD;  [facility];  Service: Gastroenterology;;   Family History Family History  Problem Relation Age of Onset  Heart disease Father    Colon polyps Sister        less than 10 lifetime polyps   Colon cancer Maternal Grandmother        dx 90s    Social History Social History   Tobacco Use   Smoking status: Every Day    Packs/day: 0.75    Years: 50.00    Total pack years: 37.50    Types: Cigarettes    Passive exposure: Current   Smokeless tobacco: Never  Vaping Use   Vaping Use: Never used  Substance Use Topics   Alcohol use: Yes    Alcohol/week: 2.0 - 3.0 standard drinks of alcohol    Types: 2 - 3 Shots of liquor per week    Comment: daily liquor moderatly   Allergies Lisinopril, Naproxen, and Nsaids  Review of Systems Review of Systems  Constitutional:  Positive for appetite change and fatigue.  Genitourinary:  Positive for decreased urine volume.    Physical Exam Vital Signs  I have reviewed the triage vital signs BP (!) 152/85 (BP Location: Right Arm)   Pulse 82   Temp (!) 97.5 F (36.4 C) (Oral)   Resp (!) 22   Ht 6' (1.829 m)   Wt 71.7 kg   SpO2 100%   BMI 21.44 kg/m   Physical Exam Constitutional:      General: He is not in acute distress.    Appearance: Normal appearance.  HENT:     Head: Normocephalic and atraumatic.     Nose: No congestion or rhinorrhea.  Eyes:     General:        Right eye: No discharge.        Left eye: No discharge.     Extraocular Movements: Extraocular movements intact.     Pupils: Pupils are equal, round, and reactive to light.  Cardiovascular:      Rate and Rhythm: Normal rate and regular rhythm.     Heart sounds: No murmur heard. Pulmonary:     Effort: No respiratory distress.     Breath sounds: No wheezing or rales.  Abdominal:     General: There is no distension.     Tenderness: There is no abdominal tenderness.  Musculoskeletal:        General: Normal range of motion.     Cervical back: Normal range of motion.  Skin:    General: Skin is warm and dry.  Neurological:     General: No focal deficit present.     Mental Status: He is alert.     ED Results and Treatments Labs (all labs ordered are listed, but only abnormal results are displayed) Labs Reviewed  COMPREHENSIVE METABOLIC PANEL - Abnormal; Notable for the following components:      Result Value   Sodium 130 (*)    Potassium 5.7 (*)    Chloride 94 (*)    CO2 16 (*)    Glucose, Bld 133 (*)    BUN 138 (*)    Creatinine, Ser 8.08 (*)    Calcium 10.7 (*)    Total Protein 9.7 (*)    AST 13 (*)    Alkaline Phosphatase 148 (*)    GFR, Estimated 7 (*)    Anion gap 20 (*)    All other components within normal limits  CBC WITH DIFFERENTIAL/PLATELET - Abnormal; Notable for the following components:   RDW 15.8 (*)    Platelets 415 (*)    All other components within normal limits  URINALYSIS, ROUTINE W REFLEX MICROSCOPIC - Abnormal; Notable for the following components:   APPearance HAZY (*)    Protein, ur 30 (*)    Bacteria, UA RARE (*)    All other components within normal limits  BLOOD GAS, VENOUS - Abnormal; Notable for the following components:   pCO2, Ven 32 (*)    pO2, Ven <31 (*)    Bicarbonate 15.5 (*)    Acid-base deficit 10.2 (*)    All other components within normal limits  TROPONIN I (HIGH SENSITIVITY) - Abnormal; Notable for the following components:   Troponin I (High Sensitivity) 29 (*)    All other components within normal limits  URINE CULTURE                                                                                                                           Radiology No results found.  Pertinent labs & imaging results that were available during my care of the patient were reviewed by me and considered in my medical decision making (see MDM for details).  Medications Ordered in ED Medications  lactated ringers infusion ( Intravenous New Bag/Given 09/03/21 2223)  sodium zirconium cyclosilicate (LOKELMA) packet 10 g (has no administration in time range)  lactated ringers bolus 1,000 mL (0 mLs Intravenous Stopped 09/03/21 2028)  lactated ringers bolus 1,000 mL (1,000 mLs Intravenous New Bag/Given 09/03/21 2222)                                                                                                                                     Procedures .Critical Care  Performed by: Teressa Lower, MD Authorized by: Teressa Lower, MD   Critical care provider statement:    Critical care time (minutes):  30   Critical care was necessary to treat or prevent imminent or life-threatening deterioration of the following conditions:  Dehydration and renal failure   Critical care was time spent personally by me on the following activities:  Development of treatment plan with patient or surrogate, discussions with consultants, evaluation of patient's response to treatment, examination of patient, ordering and review of laboratory studies, ordering and review of radiographic studies, ordering and performing treatments and interventions, pulse oximetry, re-evaluation of patient's condition and review of old charts   (including critical care time)  Medical Decision Making / ED Course   This patient presents to the ED for concern  of dehydration and abnormal labs, this involves an extensive number of treatment options, and is a complaint that carries with it a high risk of complications and morbidity.  The differential diagnosis includes prerenal AKI, obstructive uropathy, ileus, medical renal disease  MDM: Patient seen in the  emergency room for evaluation of dehydration and abnormal labs.  Physical exam largely unremarkable.  Laboratory evaluation is concerning with hyponatremia 130, hyperkalemia to 5.7, hypochloremia to 94, bicarb 16, significant new azotemia with BUN 138, creatinine 8.08 and hypercalcemia to 10.7.  Patient started on aggressive fluid resuscitation and patient did start to urinate while here.  I consulted nephrology who will evaluate the patient tomorrow.  CT renal stone ordered and is pending to ensure patient does not have an obstructive uropathy.  Renal ultrasound will be performed tomorrow as the services not available at night.  Lokelma initiated for hyperkalemia.  Patient then admitted to the hospital service for significant AKI and dehydration.   Additional history obtained: -Additional history obtained from multiple family members -External records from outside source obtained and reviewed including: Chart review including previous notes, labs, imaging, consultation notes   Lab Tests: -I ordered, reviewed, and interpreted labs.   The pertinent results include:   Labs Reviewed  COMPREHENSIVE METABOLIC PANEL - Abnormal; Notable for the following components:      Result Value   Sodium 130 (*)    Potassium 5.7 (*)    Chloride 94 (*)    CO2 16 (*)    Glucose, Bld 133 (*)    BUN 138 (*)    Creatinine, Ser 8.08 (*)    Calcium 10.7 (*)    Total Protein 9.7 (*)    AST 13 (*)    Alkaline Phosphatase 148 (*)    GFR, Estimated 7 (*)    Anion gap 20 (*)    All other components within normal limits  CBC WITH DIFFERENTIAL/PLATELET - Abnormal; Notable for the following components:   RDW 15.8 (*)    Platelets 415 (*)    All other components within normal limits  URINALYSIS, ROUTINE W REFLEX MICROSCOPIC - Abnormal; Notable for the following components:   APPearance HAZY (*)    Protein, ur 30 (*)    Bacteria, UA RARE (*)    All other components within normal limits  BLOOD GAS, VENOUS -  Abnormal; Notable for the following components:   pCO2, Ven 32 (*)    pO2, Ven <31 (*)    Bicarbonate 15.5 (*)    Acid-base deficit 10.2 (*)    All other components within normal limits  TROPONIN I (HIGH SENSITIVITY) - Abnormal; Notable for the following components:   Troponin I (High Sensitivity) 29 (*)    All other components within normal limits  URINE CULTURE      EKG   EKG Interpretation  Date/Time:  Friday September 03 2021 23:04:39 EDT Ventricular Rate:  79 PR Interval:  206 QRS Duration: 207 QT Interval:  462 QTC Calculation: 530 R Axis:   216 Text Interpretation: VENTRICULAR PACED RHYTHM Confirmed by [place], [place] ([id number]) on 09/04/2021 12:18:03 AM         Imaging Studies ordered: I ordered imaging studies including CT stone study and renal ultrasound, results are pending    Medicines ordered and prescription drug management: Meds ordered this encounter  Medications   lactated ringers bolus 1,000 mL   lactated ringers bolus 1,000 mL   lactated ringers infusion   sodium zirconium cyclosilicate (LOKELMA) packet 10 g    -  I have reviewed the patients home medicines and have made adjustments as needed  Critical interventions Aggressive fluid resuscitation, Lokelma  Consultations Obtained: I requested consultation with the nephrologist Dr. Johnney Ou,  and discussed lab and imaging findings as well as pertinent plan - they recommend: Medical admission   Cardiac Monitoring: The patient was maintained on a cardiac monitor.  I personally viewed and interpreted the cardiac monitored which showed an underlying rhythm of: Ventricular paced rhythm  Social Determinants of Health:  Factors impacting patients care include: none   Reevaluation: After the interventions noted above, I reevaluated the patient and found that they have :improved  Co morbidities that complicate the patient evaluation  Past Medical History:  Diagnosis Date   Alcohol use    Aortic  regurgitation    moderate AR 02/05/21 echo   Chronic kidney disease    stage 3   Colonic mass    Family history of colon cancer 06/08/2021   Hypertension    Presence of permanent cardiac pacemaker 02/08/2021   Inserted 02/08/21 for CHB - St Jude/[employer] MRI 2272 dual chamber PPM   Tuberculosis    as a child, was treated      Dispostion: I considered admission for this patient, and given significant new AKI, patient require hospital admission     Final Clinical Impression(s) / ED Diagnoses Final diagnoses:  None     '@PCDICTATION'$ @    Teressa Lower, MD 09/04/21 [phone]

## 2021-09-03 NOTE — ED Notes (Signed)
Provider at bedside

## 2021-09-04 ENCOUNTER — Inpatient Hospital Stay (HOSPITAL_COMMUNITY): Payer: Medicare Other

## 2021-09-04 DIAGNOSIS — E875 Hyperkalemia: Secondary | ICD-10-CM

## 2021-09-04 DIAGNOSIS — E860 Dehydration: Secondary | ICD-10-CM

## 2021-09-04 DIAGNOSIS — E871 Hypo-osmolality and hyponatremia: Secondary | ICD-10-CM

## 2021-09-04 DIAGNOSIS — N179 Acute kidney failure, unspecified: Secondary | ICD-10-CM | POA: Diagnosis not present

## 2021-09-04 DIAGNOSIS — R778 Other specified abnormalities of plasma proteins: Secondary | ICD-10-CM

## 2021-09-04 DIAGNOSIS — N189 Chronic kidney disease, unspecified: Secondary | ICD-10-CM | POA: Diagnosis not present

## 2021-09-04 DIAGNOSIS — D75839 Thrombocytosis, unspecified: Secondary | ICD-10-CM

## 2021-09-04 LAB — COMPREHENSIVE METABOLIC PANEL
ALT: 16 U/L (ref 0–44)
AST: 12 U/L — ABNORMAL LOW (ref 15–41)
Albumin: 3.6 g/dL (ref 3.5–5.0)
Alkaline Phosphatase: 116 U/L (ref 38–126)
Anion gap: 15 (ref 5–15)
BUN: 125 mg/dL — ABNORMAL HIGH (ref 8–23)
CO2: 16 mmol/L — ABNORMAL LOW (ref 22–32)
Calcium: 9.8 mg/dL (ref 8.9–10.3)
Chloride: 100 mmol/L (ref 98–111)
Creatinine, Ser: 6.02 mg/dL — ABNORMAL HIGH (ref 0.61–1.24)
GFR, Estimated: 9 mL/min — ABNORMAL LOW (ref >60)
Glucose, Bld: 83 mg/dL (ref 70–99)
Potassium: 4.2 mmol/L (ref 3.5–5.1)
Sodium: 131 mmol/L — ABNORMAL LOW (ref 135–145)
Total Bilirubin: 0.6 mg/dL (ref 0.3–1.2)
Total Protein: 7.8 g/dL (ref 6.5–8.1)

## 2021-09-04 LAB — CBC
HCT: 37.5 % — ABNORMAL LOW (ref 39.0–52.0)
Hemoglobin: 12.7 g/dL — ABNORMAL LOW (ref 13.0–17.0)
MCH: 30.6 pg (ref 26.0–34.0)
MCHC: 33.9 g/dL (ref 30.0–36.0)
MCV: 90.4 fL (ref 80.0–100.0)
Platelets: 336 10*3/uL (ref 150–400)
RBC: 4.15 MIL/uL — ABNORMAL LOW (ref 4.22–5.81)
RDW: 15.6 % — ABNORMAL HIGH (ref 11.5–15.5)
WBC: 6.8 10*3/uL (ref 4.0–10.5)
nRBC: 0 % (ref 0.0–0.2)

## 2021-09-04 LAB — MAGNESIUM: Magnesium: 2.4 mg/dL (ref 1.7–2.4)

## 2021-09-04 LAB — HIV ANTIBODY (ROUTINE TESTING W REFLEX): HIV Screen 4th Generation wRfx: NONREACTIVE

## 2021-09-04 LAB — APTT: aPTT: 32 seconds (ref 24–36)

## 2021-09-04 LAB — PHOSPHORUS: Phosphorus: 6 mg/dL — ABNORMAL HIGH (ref 2.5–4.6)

## 2021-09-04 MED ORDER — CARVEDILOL 3.125 MG PO TABS
6.2500 mg | ORAL_TABLET | Freq: Two times a day (BID) | ORAL | Status: DC
  Administered 2021-09-04 – 2021-09-06 (×4): 6.25 mg via ORAL
  Filled 2021-09-04 (×4): qty 2

## 2021-09-04 MED ORDER — ALBUTEROL SULFATE (2.5 MG/3ML) 0.083% IN NEBU
3.0000 mL | INHALATION_SOLUTION | Freq: Four times a day (QID) | RESPIRATORY_TRACT | Status: DC | PRN
Start: 2021-09-04 — End: 2021-09-06

## 2021-09-04 MED ORDER — SIMVASTATIN 20 MG PO TABS
20.0000 mg | ORAL_TABLET | Freq: Every day | ORAL | Status: DC
  Administered 2021-09-04 – 2021-09-05 (×2): 20 mg via ORAL
  Filled 2021-09-04 (×2): qty 1

## 2021-09-04 MED ORDER — HEPARIN SODIUM (PORCINE) 5000 UNIT/ML IJ SOLN
5000.0000 [IU] | Freq: Three times a day (TID) | INTRAMUSCULAR | Status: DC
  Administered 2021-09-04 – 2021-09-06 (×7): 5000 [IU] via SUBCUTANEOUS
  Filled 2021-09-04 (×7): qty 1

## 2021-09-04 NOTE — Consult Note (Signed)
[facility]  INPATIENT CONSULTATION  Reason for Consultation: AKI Requesting Provider: Dr. Wyline Copas  HPI: Fernando Dean is an 71 y.o. male with HTN, CKD 3 baseline Cr 1.8-2, h/o CHF s/p PPM, h/o colon cancer s/p resection 02/2021 and another resection 07/2021 who is seen for evaluation and management of AKI on CKD.   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   He was discharged 7/4 and was initially doing well.  He then went onto poor po intake of both liquids and solids - no desire to eat.  NO dysphagia, abd pain, GERD.  UOP declined in the days prior to admission to ~anuric.    In the ED HR and BPs normal (SBP 110s-150s), afebrile.  Labs showing Na 130, K 5.7, Bicarb 16, BUN 138, Cr 8.1, Ca 10.7, Alb 4.3, WBC 7.6 Hb 14.3. Rec'd 2L LR boluses then 100/hr.  Labs this AM Na 131, K 4.2, BIcarb 16, BUN 125, Cr 6, Hb 12.7, WBC 6.8.   Reports 4-5 watery stools per day since colectomy.  Went home and recent had developed anorexia.  Had a few episodes of feeling orthostatic.  No ACEi/ARB/NSAIDs outpt.  UOP x 2 since here - no LUTs.  CT this AM atrophic L kidney, R with multiple cystic lesions.   He actually ate breakfast this AM bacon, muffin, etc and said it tasted good.  Sister bedside.   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 - St Jude/Abbott Assurity MRI 2272 dual chamber PPM   Tuberculosis    as a child, was treated   PSH: Past Surgical History:  Procedure Laterality Date   ABDOMINAL AORTIC ENDOVASCULAR STENT GRAFT Bilateral  02/26/2021   Procedure: ABDOMINAL AORTIC ENDOVASCULAR STENT GRAFT REPAIR;  Surgeon: Marty Heck, MD;  Location: [facility];  Service: Vascular;  Laterality: Bilateral;   BIOPSY  03/12/2021   Procedure: BIOPSY;  Surgeon: Harvel Quale, MD;  [facility];  Service: Gastroenterology;;   BIOPSY  06/25/2021   Procedure: BIOPSY;  Surgeon: Harvel Quale, MD;  Location: [facility];  Service: Gastroenterology;;  mass    COLONOSCOPY WITH PROPOFOL N/A 03/12/2021   Procedure: COLONOSCOPY WITH PROPOFOL;  Surgeon: Harvel Quale, MD;  [facility]: [facility];  Service: Gastroenterology;  Laterality: N/A;  10:55   COLONOSCOPY WITH PROPOFOL N/A 06/25/2021   Procedure: COLONOSCOPY WITH PROPOFOL;  Surgeon: Harvel Quale, MD;  Location: [facility];  Service: Gastroenterology;  Laterality: N/A;  235 ASA 2   HEMOSTASIS CLIP PLACEMENT  03/12/2021   Procedure: HEMOSTASIS CLIP PLACEMENT;  Surgeon: Harvel Quale, MD;  [facility]: [facility];  Service: Gastroenterology;;   HEMOSTASIS CLIP PLACEMENT  06/25/2021   Procedure: HEMOSTASIS CLIP PLACEMENT;  Surgeon: Harvel Quale, MD;  [facility];  Service: Gastroenterology;;   HERNIA REPAIR Left 10/25/2006   PACEMAKER IMPLANT N/A 02/08/2021   Procedure: PACEMAKER IMPLANT;  Surgeon: Deboraha Sprang, MD;  Location: [facility];  Service: Cardiovascular;  Laterality: N/A;   POLYPECTOMY  03/12/2021   Procedure: POLYPECTOMY;  Surgeon: Harvel Quale, MD;  [facility];  Service: Gastroenterology;;   POLYPECTOMY  06/25/2021   Procedure: POLYPECTOMY;  Surgeon: Harvel Quale, MD;  [facility];  Service: Gastroenterology;;   [name]  03/12/2021   Procedure: [name];  Surgeon: [name], [name], MD;  Location: [facility];  Service: Gastroenterology;;    Past Medical History:  Diagnosis Date   Alcohol use    Aortic  regurgitation    moderate AR 02/05/21 echo   Chronic kidney disease    stage 3   Colonic mass    Family history of colon cancer 06/08/2021   Hypertension    Presence of permanent cardiac pacemaker 02/08/2021   Inserted 02/08/21 for CHB - St Jude/Abbott Assurity MRI 2272 dual chamber PPM   Tuberculosis    as a child, was treated    Medications:  I have reviewed the patient's current medications.  Medications Prior to Admission  Medication Sig Dispense Refill   albuterol (VENTOLIN HFA) 108 (90 Base) MCG/ACT inhaler Inhale 2 puffs into the lungs every 6 (six) hours as needed for wheezing or shortness of breath. 8 g 2   amLODipine (NORVASC) 10 MG tablet Take 1 tablet (10 mg total) by mouth daily. 30 tablet 3   aspirin EC 81 MG EC tablet Take 1 tablet (81 mg total) by mouth daily at 6 (six) AM. Swallow whole. 30 tablet 11   atorvastatin (LIPITOR) 40 MG tablet Take 1 tablet (40 mg total) by mouth daily. 90 tablet 3   bisacodyl (DULCOLAX) 5 MG EC tablet Take 5 mg by mouth daily as needed for moderate constipation.     carvedilol (COREG) 6.25 MG tablet Take 1 tablet (6.25 mg total) by mouth 2 (two) times daily with a meal. 60 tablet 3   famotidine (PEPCID) 20 MG tablet Take 1 tablet (20 mg total) by mouth 2 (two) times daily for 2 days. (Patient not taking: Reported on 07/09/2021) 30 tablet 0   loperamide (IMODIUM A-D) 2 MG tablet Take 1 tablet (2 mg total) by mouth 4 (four) times daily as needed for diarrhea or loose stools.  0   Multiple Vitamins-Minerals (MULTIVITAMIN WITH MINERALS) tablet Take 1 tablet by mouth daily. Centrum Silver for Men 50+     nicotine (NICODERM CQ - DOSED IN MG/24 HOURS) 21 mg/24hr patch Place 21 mg onto the skin daily as needed (nicotine cravings).     oxyCODONE (OXY IR/ROXICODONE) 5 MG immediate release tablet Take 1 tablet (5 mg total) by mouth every 6 (six) hours as needed for moderate pain. 20 tablet 0   simvastatin (ZOCOR) 20 MG tablet Take 20 mg by mouth at  bedtime.      ALLERGIES:   Allergies  Allergen Reactions   Lisinopril Anaphylaxis   Naproxen Shortness Of Breath and Palpitations   Nsaids Shortness Of Breath and Palpitations    FAM HX: Family History  Problem Relation Age of Onset   Heart disease Father    Colon polyps Sister        less than 10 lifetime polyps   Colon cancer Maternal Grandmother        dx 90s    Social History:   reports that he has been smoking cigarettes. He has a 37.50 pack-year smoking history. He has been exposed to tobacco smoke. He has never used smokeless tobacco. He reports current alcohol use of about 2.0 - 3.0  standard drinks of alcohol per week.  Drug: Marijuana.  ROS: 12 system ROS neg except per HPI above  Blood pressure 122/76, pulse 81, temperature 98.5 F (36.9 C), resp. rate 19, height 6' (1.829 m), weight 71.7 kg, SpO2 98 %. PHYSICAL EXAM: Gen: lying calmly in bed  Eyes:  EOMI ENT: MM tacky Neck: supple, no JVD CV:  RRR Abd: soft, +BS, well healed incisions Lungs: clear GU: no foley Extr: no edema Neuro: nonfocal Skin: no rashes   Results for orders placed or performed during the hospital encounter of 09/03/21 (from the past 48 hour(s))  Urinalysis, Routine w reflex microscopic Urine, Clean Catch     Status: Abnormal   Collection Time: 09/03/21  7:21 PM  Result Value Ref Range   Color, Urine YELLOW YELLOW   APPearance HAZY (A) CLEAR   Specific Gravity, Urine 1.014 1.005 - 1.030   pH 5.0 5.0 - 8.0   Glucose, UA NEGATIVE NEGATIVE mg/dL   Hgb urine dipstick NEGATIVE NEGATIVE   Bilirubin Urine NEGATIVE NEGATIVE   Ketones, ur NEGATIVE NEGATIVE mg/dL   Protein, ur 30 (A) NEGATIVE mg/dL   Nitrite NEGATIVE NEGATIVE   Leukocytes,Ua NEGATIVE NEGATIVE   RBC / HPF 0-5 0 - 5 RBC/hpf   WBC, UA 0-5 0 - 5 WBC/hpf   Bacteria, UA RARE (A) NONE SEEN   Squamous Epithelial / LPF 0-5 0 - 5   Mucus PRESENT    Hyaline Casts, UA PRESENT     Comment: Performed at [facility], [address], [place], [place] [address]  Comprehensive metabolic panel     Status: Abnormal   Collection Time: 09/03/21  7:34 PM  Result Value Ref Range   Sodium 130 (L) 135 - 145 mmol/L   Potassium 5.7 (H) 3.5 - 5.1 mmol/L   Chloride 94 (L) 98 - 111 mmol/L   CO2 16 (L) 22 - 32 mmol/L   Glucose, Bld 133 (H) 70 - 99 mg/dL    Comment: Glucose reference range applies only to samples taken after fasting for at least 8 hours.   BUN 138 (H) 8 - 23 mg/dL    Comment: RESULTS CONFIRMED BY MANUAL DILUTION   Creatinine, Ser 8.08 (H) 0.61 - 1.24 mg/dL   Calcium 10.7 (H) 8.9 - 10.3 mg/dL   Total Protein 9.7 (H) 6.5 - 8.1 g/dL   Albumin 4.3 3.5 - 5.0 g/dL   AST 13 (L) 15 - 41 U/L   ALT 20 0 - 44 U/L   Alkaline Phosphatase 148 (H) 38 - 126 U/L   Total Bilirubin 0.6 0.3 - 1.2 mg/dL   GFR, Estimated 7 (L) >60 mL/min    Comment: (NOTE) Calculated using the CKD-EPI Creatinine Equation (2021)    Anion gap 20 (H) 5 - 15    Comment: Performed at [facility], [address], [place], [place] [address]  Troponin I (High Sensitivity)     Status: Abnormal   Collection Time: 09/03/21  7:34 PM  Result Value Ref Range   Troponin I (High Sensitivity) 29 (H) <18 ng/L    Comment: (NOTE) Elevated high sensitivity troponin I (hsTnI) values and significant  changes across serial measurements may suggest ACS but many other  chronic and acute conditions are known to elevate hsTnI results.  Refer to the Links section for chest pain algorithms and additional  guidance. Performed at [facility], [address] [place], [place] [address]   CBC with Differential     Status: Abnormal  Collection Time: 09/03/21  7:34 PM  Result Value Ref Range   WBC 7.6 4.0 - 10.5 K/uL   RBC 4.66 4.22 - 5.81 MIL/uL   Hemoglobin 14.3 13.0 - 17.0 g/dL   HCT 42.1 39.0 - 52.0 %   MCV 90.3 80.0 - 100.0 fL   MCH 30.7 26.0 - 34.0 pg   MCHC 34.0 30.0 - 36.0 g/dL   RDW 15.8 (H) 11.5 - 15.5 %   Platelets 415 (H) 150 - 400 K/uL   nRBC 0.0 0.0 -  0.2 %   Neutrophils Relative % 55 %   Neutro Abs 4.2 1.7 - 7.7 K/uL   Lymphocytes Relative 35 %   Lymphs Abs 2.7 0.7 - 4.0 K/uL   Monocytes Relative 8 %   Monocytes Absolute 0.6 0.1 - 1.0 K/uL   Eosinophils Relative 1 %   Eosinophils Absolute 0.1 0.0 - 0.5 K/uL   Basophils Relative 0 %   Basophils Absolute 0.0 0.0 - 0.1 K/uL   Immature Granulocytes 1 %   Abs Immature Granulocytes 0.05 0.00 - 0.07 K/uL    Comment: Performed at [facility], [address], [place], [place] [address]  Blood gas, venous (at [facility] and [facility], not at [facility])     Status: Abnormal   Collection Time: 09/03/21 11:10 PM  Result Value Ref Range   FIO2 21.0 %   pH, Ven 7.29 7.25 - 7.43   pCO2, Ven 32 (L) 44 - 60 mmHg   pO2, Ven <31 (LL) 32 - 45 mmHg    Comment: CRITICAL RESULT CALLED TO, READ BACK BY AND VERIFIED WITH: PRUITT,G ON 09/03/2021 @ 2323 BY ACOSTA,A    Bicarbonate 15.5 (L) 20.0 - 28.0 mmol/L   Acid-base deficit 10.2 (H) 0.0 - 2.0 mmol/L   O2 Saturation 45.7 %   Patient temperature 36.3    Collection site BLOOD RIGHT HAND    Drawn by [id number]     Comment: Performed at [facility], [address], [place], [place] [address]  Comprehensive metabolic panel     Status: Abnormal   Collection Time: 09/04/21  5:00 AM  Result Value Ref Range   Sodium 131 (L) 135 - 145 mmol/L   Potassium 4.2 3.5 - 5.1 mmol/L    Comment: DELTA CHECK NOTED   Chloride 100 98 - 111 mmol/L   CO2 16 (L) 22 - 32 mmol/L   Glucose, Bld 83 70 - 99 mg/dL    Comment: Glucose reference range applies only to samples taken after fasting for at least 8 hours.   BUN 125 (H) 8 - 23 mg/dL    Comment: RESULTS CONFIRMED BY MANUAL DILUTION   Creatinine, Ser 6.02 (H) 0.61 - 1.24 mg/dL   Calcium 9.8 8.9 - 10.3 mg/dL   Total Protein 7.8 6.5 - 8.1 g/dL   Albumin 3.6 3.5 - 5.0 g/dL   AST 12 (L) 15 - 41 U/L   ALT 16 0 - 44 U/L   Alkaline Phosphatase 116 38 - 126 U/L   Total Bilirubin 0.6 0.3 - 1.2 mg/dL   GFR, Estimated 9 (L) >60 mL/min    Comment:  (NOTE) Calculated using the CKD-EPI Creatinine Equation (2021)    Anion gap 15 5 - 15    Comment: Performed at [facility], [address] [place], [place] [address]  CBC     Status: Abnormal   Collection Time: 09/04/21  5:00 AM  Result Value Ref Range   WBC 6.8 4.0 - 10.5 K/uL  RBC 4.15 (L) 4.22 - 5.81 MIL/uL   Hemoglobin 12.7 (L) 13.0 - 17.0 g/dL   HCT 37.5 (L) 39.0 - 52.0 %   MCV 90.4 80.0 - 100.0 fL   MCH 30.6 26.0 - 34.0 pg   MCHC 33.9 30.0 - 36.0 g/dL   RDW 15.6 (H) 11.5 - 15.5 %   Platelets 336 150 - 400 K/uL   nRBC 0.0 0.0 - 0.2 %    Comment: Performed at [facility], [address] [place], [place] [address]  APTT     Status: None   Collection Time: 09/04/21  5:00 AM  Result Value Ref Range   aPTT 32 24 - 36 seconds    Comment: Performed at [facility], [address], [place], [place] [address]  Magnesium     Status: None   Collection Time: 09/04/21  5:00 AM  Result Value Ref Range   Magnesium 2.4 1.7 - 2.4 mg/dL    Comment: Performed at [facility], [address], [place], [place] [address]  Phosphorus     Status: Abnormal   Collection Time: 09/04/21  5:00 AM  Result Value Ref Range   Phosphorus 6.0 (H) 2.5 - 4.6 mg/dL    Comment: Performed at [facility], [address], [place], [place] [address]    CT Renal Joaquim Lai Study  Result Date: 09/04/2021 CLINICAL DATA:  71 year old male under evaluation for potential hydronephrosis. EXAM: CT ABDOMEN AND PELVIS WITHOUT CONTRAST TECHNIQUE: Multidetector CT imaging of the abdomen and pelvis was performed following the standard protocol without IV contrast. RADIATION DOSE REDUCTION: This exam was performed according to the departmental dose-optimization program which includes automated exposure control, adjustment of the mA and/or kV according to patient size and/or use of iterative reconstruction technique. COMPARISON:  PET-CT 06/10/2021. FINDINGS: Lower chest: Emphysema. Atherosclerotic calcifications in the descending  thoracic aorta as well as the left main, left anterior descending, left circumflex and right coronary arteries. Pacemaker leads in the right atrium and right ventricle. Hepatobiliary: No definite suspicious cystic or solid hepatic lesions are confidently identified on today's noncontrast CT examination. There is a subcentimeter low-attenuation lesion in the posterior aspect of the right lobe of the liver, incompletely characterized on today's non-contrast CT examination, but statistically likely a small cyst. Unenhanced appearance of the gallbladder is unremarkable. Pancreas: No definite pancreatic mass or peripancreatic fluid collections or inflammatory changes are noted on today's noncontrast CT examination. Spleen: Unremarkable. Adrenals/Urinary Tract: Severe left renal atrophy. Multiple lesions in the right kidney, incompletely characterized on today's noncontrast CT examination, but ranging from low to intermediate to high attenuation. The largest of these lesions is an exophytic intermediate attenuation lesion in the lateral aspect of the interpolar region of the right kidney measuring 1.7 cm in diameter (axial image 34 of series 2). These lesions are grossly similar to prior CT 04/08/2021, likely a combination of cysts and mildly proteinaceous/hemorrhagic cysts. No hydroureteronephrosis. Urinary bladder is normal in appearance. Stomach/Bowel: The unenhanced appearance of the stomach is normal. No pathologic dilatation of small bowel or colon. Postoperative changes of partial colectomy are noted. 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 No lymphadenopathy noted in the abdomen or pelvis. Reproductive: Prostate gland and seminal vesicles are unremarkable in appearance. Other: No significant volume of ascites.  No  pneumoperitoneum. Musculoskeletal: There are no aggressive appearing  lytic or blastic lesions noted in the visualized portions of the skeleton. IMPRESSION: 1. No hydroureteronephrosis. 2. Multiple lesions in the right kidney varying degrees of complexity, likely to represent proteinaceous/hemorrhagic cysts and simple cysts, but incompletely characterized on today's noncontrast CT examination. These could be further characterized with follow-up nonemergent abdominal MRI with and without IV gadolinium if of clinical concern. 3. Severe atrophy of the left kidney, unchanged. 4. Emphysema. 5. Aortic atherosclerosis, in addition to left main and three-vessel coronary artery disease. 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 6. Additional incidental findings, as above. Electronically Signed   By: Vinnie Langton M.D.   On: 09/04/2021 07:20    Assessment/Plan:  Fernando Dean is an 71 y.o. male with HTN, CKD 3 baseline Cr 1.8-2, h/o CHF s/p PPM, h/o colon cancer s/p resection 02/2021 and another resection 07/2021 who is seen for evaluation and management of AKI on CKD.   **severe AKI on CKD 3b:  Baseline Cr 2 now in with Cr 8 2 wks after partial colectomy.  Certainly a component of prerenal azotemia but may have progressed to ATN.  ? If he was prerenal with frequent stools, got AKI and uremic anorexia.  Renal US without obstruction, UA 1+ protein o/w bland - inconsistent with GN.  Improving with hydration.  No current indications for RRT but will follow closely as they may still arise.  For now continue with the MIVF and supportive care.  Strict I/Os, daily weights.  Avoid nephrotoxins.   **Hyperkalemia: resolved with hydration  **Metabolic acidosis: secondary to AKI, bicarb 16, follow - may need po bicarb if worsening  **Hyponatremia:  hypovolemic; improving with  IVF  **s/p recent colectomy:  4-5 water BMs/day, may end up needing tx to slow stooling.   **R renal lesions: stable c/w 03/2021 suspected cysts but further imaging recommended - MRI w and w/o gad rec --> would need GFR back to baseline for gad.  OK to defer until more stable/outpt.   Will follow, call with questions/concerns.   Justin Mend 09/04/2021, 1:32 PM

## 2021-09-04 NOTE — Progress Notes (Signed)
  Progress Note   Patient: Fernando Dean LGX:[id number] DOB: Jul 26, 1950 DOA: 09/03/2021     1 DOS: the patient was seen and examined on 09/04/2021   Brief hospital course: 71 y.o. 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 Pt was found to have Cr of 8.08. Nephrology was consulted  Assessment and Plan: Acute kidney injury on CKD stage 3B Dehydration BUN 138, creatinine 8.08 (baseline creatinine at 1.5-1.8) Continue IV hydration Renally adjust medications, avoid nephrotoxic agents/dehydration/hypotension Appreciate input by Nephrology. No indication for RRT but will cont to follow closely No evidence of hydronephrosis on CT, however this AM, was noted to have 350cc on bladder scan. S/p I/O cath  Hyponatremia possibly due to dehydration -sodium 130 at presentation -Continue IV hydration -repeat bmet in AM  Hyperkalemia Resolved with lokelma Repeat bmet in AM  Thrombocytosis possibly reactive Platelets 415 at presentation Normalized  Hypercalcemia Calcium 10.7 Improved with IVF hydration  Chronically elevated troponin possibly secondary to type II demand ischemia Troponin x1 - 29, patient denies any chest pain, this was 36 about 7 months ago   Complete heart block s/p pulmonary pacemaker placement Stable   Prolonged QT interval QTc 530 ms; patient is ventricularly paced       Subjective: Without complaints this AM. Denies abd pain or sob  Physical Exam: Vitals:   09/03/21 2309 09/03/21 2345 09/04/21 0416 09/04/21 1454  BP:  (!) 152/85 122/76 134/79  Pulse:  82 81 77  Resp:  (!) '22 19 20  '$ Temp: (!) 97.3 F (36.3 C) (!) 97.5 F (36.4 C) 98.5 F (36.9 C)   TempSrc: Axillary Oral    SpO2:  100% 98% 100%  Weight:  71.7 kg     Height:  6' (1.829 m)     General exam: Awake, laying in bed, in nad Respiratory system: Normal respiratory effort, no wheezing Cardiovascular system: regular rate, s1, s2 Gastrointestinal system: Soft, nondistended, positive BS Central nervous system: CN2-12 grossly intact, strength intact Extremities: Perfused, no clubbing Skin: Normal skin turgor, no notable skin lesions seen Psychiatry: Mood normal // no visual hallucinations   Data Reviewed:  Labs reviewed: Na 131, Cr 6.02, Hgb 12.7  Family Communication: Pt in room, family not at bedside  Disposition: Status is: Inpatient Remains inpatient appropriate because: Severity of illness  Planned Discharge Destination: Home    Author: Marylu Lund, MD 09/04/2021 3:55 PM  For on call review [email or web address].

## 2021-09-04 NOTE — Hospital Course (Signed)
71 y.o. 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 Pt was found to have Cr of 8.08. Nephrology was consulted

## 2021-09-04 NOTE — Progress Notes (Signed)
Patient voided twice in the toilet; educated patient to void into urinal so we can measure output. Deirdre Pippins, RN

## 2021-09-05 DIAGNOSIS — N1832 Chronic kidney disease, stage 3b: Secondary | ICD-10-CM

## 2021-09-05 DIAGNOSIS — I10 Essential (primary) hypertension: Secondary | ICD-10-CM

## 2021-09-05 DIAGNOSIS — R778 Other specified abnormalities of plasma proteins: Secondary | ICD-10-CM | POA: Diagnosis not present

## 2021-09-05 DIAGNOSIS — N179 Acute kidney failure, unspecified: Secondary | ICD-10-CM | POA: Diagnosis not present

## 2021-09-05 DIAGNOSIS — I442 Atrioventricular block, complete: Secondary | ICD-10-CM | POA: Diagnosis not present

## 2021-09-05 LAB — COMPREHENSIVE METABOLIC PANEL
ALT: 21 U/L (ref 0–44)
AST: 20 U/L (ref 15–41)
Albumin: 3.2 g/dL — ABNORMAL LOW (ref 3.5–5.0)
Alkaline Phosphatase: 100 U/L (ref 38–126)
Anion gap: 10 (ref 5–15)
BUN: 102 mg/dL — ABNORMAL HIGH (ref 8–23)
CO2: 18 mmol/L — ABNORMAL LOW (ref 22–32)
Calcium: 9.2 mg/dL (ref 8.9–10.3)
Chloride: 108 mmol/L (ref 98–111)
Creatinine, Ser: 3.7 mg/dL — ABNORMAL HIGH (ref 0.61–1.24)
GFR, Estimated: 17 mL/min — ABNORMAL LOW (ref >60)
Glucose, Bld: 86 mg/dL (ref 70–99)
Potassium: 3.8 mmol/L (ref 3.5–5.1)
Sodium: 136 mmol/L (ref 135–145)
Total Bilirubin: 0.4 mg/dL (ref 0.3–1.2)
Total Protein: 6.9 g/dL (ref 6.5–8.1)

## 2021-09-05 LAB — URINE CULTURE: Culture: NO GROWTH

## 2021-09-05 LAB — CBC
HCT: 33.7 % — ABNORMAL LOW (ref 39.0–52.0)
Hemoglobin: 11.6 g/dL — ABNORMAL LOW (ref 13.0–17.0)
MCH: 31.2 pg (ref 26.0–34.0)
MCHC: 34.4 g/dL (ref 30.0–36.0)
MCV: 90.6 fL (ref 80.0–100.0)
Platelets: 270 10*3/uL (ref 150–400)
RBC: 3.72 MIL/uL — ABNORMAL LOW (ref 4.22–5.81)
RDW: 15.6 % — ABNORMAL HIGH (ref 11.5–15.5)
WBC: 6.3 10*3/uL (ref 4.0–10.5)
nRBC: 0 % (ref 0.0–0.2)

## 2021-09-05 MED ORDER — ENSURE ENLIVE PO LIQD
237.0000 mL | Freq: Three times a day (TID) | ORAL | 12 refills | Status: AC
Start: 2021-09-05 — End: ?

## 2021-09-05 MED ORDER — ADULT MULTIVITAMIN W/MINERALS CH
1.0000 | ORAL_TABLET | Freq: Every day | ORAL | Status: DC
  Administered 2021-09-06: 1 via ORAL
  Filled 2021-09-05: qty 1

## 2021-09-05 MED ORDER — ENSURE ENLIVE PO LIQD
237.0000 mL | Freq: Three times a day (TID) | ORAL | Status: DC
  Administered 2021-09-05 – 2021-09-06 (×3): 237 mL via ORAL

## 2021-09-05 NOTE — Discharge Summary (Signed)
Physician Discharge Summary   Patient: Fernando Dean MRN: [id number] DOB: 06/21/1950  Admit date:     09/03/2021  Discharge date: 09/06/2021  Discharge Physician: Shanon Brow Kyal Arts   PCP: [name], FNP    Please follow up with primary care provider within 1-2 weeks  Please repeat BMP and CBC in one week    Hospital Course: 71 y.o. 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 Pt was found to have Cr of 8.08. Nephrology was consulted and patient was started on IVF.  Notably, the patient was recent admitted to the hospital [facility]) from 08/18/2021 to 08/24/2021 during which time he underwent a robotic assisted right-sided colectomy (Dr. Leighton Ruff).  His postoperative course was complicated by postoperative ileus.  He subsequently recovered and eventually was discharged home after he tolerated his diet.  His family stated that patient was doing well after the surgery and for about 4 to 5 days after the surgery, but soon after the patient's oral intake gradually decreased and continued to worsen.  They also noted that the patient's urine output gradually decreased in the 2 to 3 days prior to admission.  He went to see his PCP and had lab work done.  He was noted to have an elevated serum creatinine and hyperkalemia.  He was told to go to emergency department for further evaluation and treatment.  Assessment and Plan: * Acute renal failure superimposed on stage 3b chronic kidney disease (HCC) Baseline creatinine 1.6-1.8 Patient presented with serum creatinine 8.08 Due to prerenal azotemia with possible component of ATN Appreciate nephrology, Dr. 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 Delton Coombes 09/23/2021 at 11:30 AM  Complete heart block ([place]) Status post permanent pacemaker 02/08/2021  Essential hypertension Continue carvedilol Holding amlodipine--restart at 2. 5 mg         Consultants: nephrology Procedures performed: none  Disposition: Home Diet recommendation:  Cardiac diet DISCHARGE MEDICATION: Allergies as of 09/06/2021       Reactions   Lisinopril Anaphylaxis   Naproxen Shortness Of Breath, Palpitations   Nsaids Shortness Of Breath, Palpitations        Medication List     STOP taking these medications    famotidine 20 MG tablet Commonly known as: PEPCID   metroNIDAZOLE 500 MG tablet Commonly known as: FLAGYL   nicotine 21 mg/24hr patch Commonly known as: NICODERM CQ - dosed in mg/24 hours   oxyCODONE 5 MG immediate release tablet Commonly known as: Oxy IR/ROXICODONE   oxyCODONE-acetaminophen 5-325 MG tablet Commonly known as:  PERCOCET/ROXICET   simvastatin 20 MG tablet Commonly known as: ZOCOR   traMADol 50 MG tablet Commonly known as: ULTRAM       TAKE these medications    albuterol 108 (90 Base)  MCG/ACT inhaler Commonly known as: VENTOLIN HFA Inhale 2 puffs into the lungs every 6 (six) hours as needed for wheezing or shortness of breath.   amLODipine 2.5 MG tablet Commonly known as: NORVASC Take 1 tablet (2.5 mg total) by mouth daily. What changed:  medication strength how much to take   aspirin EC 81 MG tablet Take 1 tablet (81 mg total) by mouth daily at 6 (six) AM. Swallow whole.   atorvastatin 40 MG tablet Commonly known as: LIPITOR Take 1 tablet (40 mg total) by mouth daily.   bisacodyl 5 MG EC tablet Commonly known as: DULCOLAX Take 5 mg by mouth daily as needed for moderate constipation.   carvedilol 6.25 MG tablet Commonly known as: COREG Take 1 tablet (6.25 mg total) by mouth 2 (two) times daily with a meal.   feeding supplement Liqd Take 237 mLs by mouth 3 (three) times daily between meals.   loperamide 2 MG tablet Commonly known as: Imodium A-D Take 1 tablet (2 mg total) by mouth 4 (four) times daily as needed for diarrhea or loose stools.   multivitamin with minerals tablet Take 1 tablet by mouth daily. Centrum Silver for Men 50+        Discharge Exam: Filed Weights   09/03/21 1900 09/03/21 2345 09/05/21 2321  Weight: 72.6 kg 71.7 kg 71.7 kg   HEENT:  [place]/AT, No thrush, no icterus CV:  RRR, no rub, no S3, no S4 Lung:  CTA, no wheeze, no rhonchi Abd:  soft/+BS, NT Ext:  No edema, no lymphangitis, no synovitis, no rash   Condition at discharge: stable  The results of significant diagnostics from this hospitalization (including imaging, microbiology, ancillary and laboratory) are listed below for reference.   Imaging Studies: US Renal  Result Date: 09/04/2021 CLINICAL DATA:  Azotemia EXAM: RENAL / URINARY TRACT ULTRASOUND COMPLETE COMPARISON:  CT 09/04/2021 FINDINGS: Right Kidney: Renal measurements: 11.4 x 5.4 x 7.2 cm = volume: 231 mL. Increased renal cortical echogenicity. Adjacent cysts within the interpolar region of the right kidney  measuring up to 1.7 cm and 1.6 cm. No solid mass, shadowing stone, or hydronephrosis visualized. Left Kidney: Renal measurements: 5.7 x 2.2 x 3.2 cm = volume: 21 mL. Atrophic kidney with increased cortical echogenicity. 1.2 cm lower pole cyst. No solid mass, shadowing stone or hydronephrosis visualized. Bladder: Appears normal for degree of bladder distention. Other: None. IMPRESSION: 1. No evidence of obstructive uropathy. 2. Echogenic kidneys with left renal atrophy, likely sequela of medical renal disease. 3. Bilateral renal cysts.  No solid renal mass identified. Electronically Signed   By: Davina Poke D.O.   On: 09/04/2021 13:35   CT Renal Stone Study  Result Date: 09/04/2021 CLINICAL DATA:  71 year old male under evaluation for potential hydronephrosis. EXAM: CT ABDOMEN AND PELVIS WITHOUT CONTRAST TECHNIQUE: Multidetector CT imaging of the abdomen and pelvis was performed following the standard protocol without IV contrast. RADIATION DOSE REDUCTION: This exam was performed according to the departmental dose-optimization program which includes automated exposure control, adjustment of the mA and/or kV according to patient size and/or use of iterative reconstruction technique. COMPARISON:  PET-CT 06/10/2021. FINDINGS: Lower chest: Emphysema. Atherosclerotic calcifications in the descending thoracic aorta as well as the left main, left anterior descending, left circumflex and right coronary  arteries. Pacemaker leads in the right atrium and right ventricle. Hepatobiliary: No definite suspicious cystic or solid hepatic lesions are confidently identified on today's noncontrast CT examination. There is a subcentimeter low-attenuation lesion in the posterior aspect of the right lobe of the liver, incompletely characterized on today's non-contrast CT examination, but statistically likely a small cyst. Unenhanced appearance of the gallbladder is unremarkable. Pancreas: No definite pancreatic mass or  peripancreatic fluid collections or inflammatory changes are noted on today's noncontrast CT examination. Spleen: Unremarkable. Adrenals/Urinary Tract: Severe left renal atrophy. Multiple lesions in the right kidney, incompletely characterized on today's noncontrast CT examination, but ranging from low to intermediate to high attenuation. The largest of these lesions is an exophytic intermediate attenuation lesion in the lateral aspect of the interpolar region of the right kidney measuring 1.7 cm in diameter (axial image 34 of series 2). These lesions are grossly similar to prior CT 04/08/2021, likely a combination of cysts and mildly proteinaceous/hemorrhagic cysts. No hydroureteronephrosis. Urinary bladder is normal in appearance. Stomach/Bowel: The unenhanced appearance of the stomach is normal. No pathologic dilatation of small bowel or colon. Postoperative changes of partial colectomy are noted. 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 No lymphadenopathy noted in the abdomen or pelvis. Reproductive: Prostate gland and seminal vesicles are unremarkable in appearance. Other: No significant volume of ascites.  No pneumoperitoneum. Musculoskeletal: There are no aggressive appearing lytic or blastic lesions noted in the visualized portions of the skeleton. IMPRESSION: 1. No hydroureteronephrosis. 2. Multiple lesions in the right kidney varying degrees of complexity, likely to represent proteinaceous/hemorrhagic cysts and simple cysts, but incompletely characterized on today's noncontrast CT examination. These could be further characterized with follow-up nonemergent abdominal MRI with and without IV gadolinium if of clinical concern. 3. Severe atrophy of the left kidney, unchanged. 4. Emphysema. 5. Aortic  atherosclerosis, in addition to left main and three-vessel coronary artery disease. 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 6. Additional incidental findings, as above. Electronically Signed   By: Vinnie Langton M.D.   On: 09/04/2021 07:20   CUP PACEART REMOTE DEVICE CHECK  Result Date: 08/12/2021 Scheduled remote reviewed. Normal device function.  1 AMS, PAT, 8sec in duration Next remote 91 days. H[place]   Microbiology: Results for orders placed or performed during the hospital encounter of 09/03/21  Urine Culture     Status: None   Collection Time: 09/03/21  7:21 PM   Specimen: Urine, Clean Catch  Result Value Ref Range Status   Specimen Description   Final    URINE, CLEAN CATCH Performed at [facility], [name] [place], [place] [address]    Special Requests   Final    NONE Performed at [facility], [address], [place], [place] [address]    Culture   Final    NO GROWTH Performed at [facility], [place] [address] [place], [place] [address]    Report Status 09/05/2021 FINAL  Final    Labs: CBC: Recent Labs  Lab 09/03/21 1934 09/04/21 0500 09/05/21 0549  WBC 7.6 6.8 6.3  NEUTROABS 4.2  --   --   HGB 14.3 12.7* 11.6*  HCT 42.1 37.5* 33.7*  MCV 90.3  90.4 90.6  PLT 415* 336 277   Basic Metabolic Panel: Recent Labs  Lab 09/03/21 1934 09/04/21 0500 09/05/21 0549 09/06/21 0503  NA 130* 131* 136 136  K 5.7* 4.2 3.8 3.8  CL 94* 100 108 107  CO2 16* 16* 18* 20*  GLUCOSE 133* 83 86 91  BUN 138* 125* 102* 79*  CREATININE 8.08* 6.02* 3.70* 2.76*  CALCIUM 10.7* 9.8 9.2 8.9  MG  --  2.4  --  2.2  PHOS  --  6.0*  --  3.6   Liver Function Tests: Recent Labs  Lab 09/03/21 1934 09/04/21 0500 09/05/21 0549 09/06/21 0503  AST 13* 12* 20 31  ALT '20 16 21 '$ 34  ALKPHOS 148* 116 100 99  BILITOT 0.6 0.6 0.4 0.5   PROT 9.7* 7.8 6.9 7.1  ALBUMIN 4.3 3.6 3.2* 3.2*   CBG: No results for input(s): "GLUCAP" in the last 168 hours.  Discharge time spent: greater than 30 minutes.  Signed: Orson Eva, MD [facility] 09/06/2021

## 2021-09-05 NOTE — Progress Notes (Signed)
Initial Nutrition Assessment  DOCUMENTATION CODES:   Not applicable  INTERVENTION:   Ensure Enlive po TID, each supplement provides 350 kcal and 20 grams of protein.  MVI po daily   Pt at high refeed risk; recommend monitor potassium, magnesium and phosphorus labs daily until stable  NUTRITION DIAGNOSIS:   Inadequate oral intake related to acute illness as evidenced by per patient/family report.  GOAL:   Patient will meet greater than or equal to 90% of their needs  MONITOR:   PO intake, Supplement acceptance, Labs, Weight trends, Skin, I & O's  REASON FOR ASSESSMENT:   Malnutrition Screening Tool    ASSESSMENT:   71 y.o. 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  RD working remotely.  Spoke with pt via phone. Pt reports poor appetite and oral intake ever since having his surgery in June. Pt reports that he has been drinking some strawberry Ensure at home. Per chart, pt is down 15lbs(9%) over the past 3 weeks; this is significant weight loss.Pt reports that his appetite and oral intake have improved in hospital; pt eating 50% of meals. RD will add supplements and MVI to help pt meet his estimated needs. Pt is at high refeed risk. Pt at high risk for malnutrition. RD will obtain nutrition related history and exam at follow-up.   Medications reviewed and include: heparin  Labs reviewed: K 3.8 wnl, BUN 102, creat 3.70(H)  NUTRITION - FOCUSED PHYSICAL EXAM: Unable to perform at this time  Diet Order:   Diet Order             Diet regular Room service appropriate? Yes; Fluid consistency: Thin  Diet effective now                  EDUCATION NEEDS:   Education needs  have been addressed  Skin:  Skin Assessment: Reviewed RN Assessment (closed incision abdomen)  Last BM:  7/15- type 7  Height:   Ht Readings from Last 1 Encounters:  09/03/21 6' (1.829 m)    Weight:   Wt Readings from Last 1 Encounters:  09/03/21 71.7 kg    Ideal Body Weight:  80.9 kg  BMI:  Body mass index is 21.44 kg/m.  Estimated Nutritional Needs:   Kcal:  2000-2300kcal/day  Protein:  100-115g/day  Fluid:  1.8-2.1L/day  Koleen Distance MS, RD, LDN Please refer to [facility] for RD and/or RD on-call/weekend/after hours pager

## 2021-09-05 NOTE — Assessment & Plan Note (Signed)
Acute phase reactant -Improving with IV fluids

## 2021-09-05 NOTE — Assessment & Plan Note (Signed)
Secondary to hypovolemia -Improving with IV fluids

## 2021-09-05 NOTE — Assessment & Plan Note (Addendum)
Continue carvedilol Holding amlodipine--restart at 2. 5 mg

## 2021-09-05 NOTE — Assessment & Plan Note (Signed)
Secondary to renal failure -Improved with IV fluids

## 2021-09-05 NOTE — Assessment & Plan Note (Signed)
Status post permanent pacemaker 02/08/2021

## 2021-09-05 NOTE — Assessment & Plan Note (Signed)
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 Delton Coombes 09/23/2021 at 11:30 AM

## 2021-09-05 NOTE — Progress Notes (Addendum)
PROGRESS NOTE  Fernando Dean PPJ:[id number] DOB: [birth year] DOA: 09/03/2021 PCP: [name], FNP  Brief History:  71 y.o. 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 Pt was found to have Cr of 8.08. Nephrology was consulted and patient was started on IVF.  Notably, the patient was recent admitted to the hospital [facility]) from 08/18/2021 to 08/24/2021 during which time he underwent a robotic assisted right-sided colectomy (Dr. Leighton Ruff).  His postoperative course was complicated by postoperative ileus.  He subsequently recovered and eventually was discharged home after he tolerated his diet.  His family stated that patient was doing well after the surgery and for about 4 to 5 days after the surgery, but soon after the patient's oral intake gradually decreased and continued to worsen.  They also noted that the patient's urine output gradually decreased in the 2 to 3 days prior to admission.  He went to see his PCP and had lab work done.  He was noted to have an elevated serum creatinine and hyperkalemia.  He was told to go to emergency department for further evaluation and treatment.     Assessment and Plan: * Acute renal failure superimposed on stage 3b chronic kidney disease (HCC) Baseline creatinine 1.6-1.8 Patient presented with serum creatinine 8.08 Due to prerenal azotemia with possible component of ATN Appreciate nephrology, Dr. 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 Delton Coombes 09/23/2021 at 11:30 AM  Complete heart block ([place]) Status post permanent pacemaker 02/08/2021  Essential hypertension Continue carvedilol Holding amlodipine      Family Communication:   no Family at bedside  Consultants:  renal  Code Status:  FULL  DVT Prophylaxis:  [place] Heparin    Procedures: As Listed in Progress Note Above  Antibiotics: None      Subjective: Patient denies fevers, chills, headache, chest pain, dyspnea, nausea, vomiting, abdominal pain, dysuria, hematuria, hematochezia, and melena. He had 3-4 loose stools in last 24 hours   Objective: Vitals:   09/04/21 0416 09/04/21 1454 09/04/21 2106 09/05/21 0508  BP: 122/76 134/79 140/68 117/76  Pulse: 81 77 77 72  Resp: '19 20 20 18  '$ Temp: 98.5 F (36.9 C)   97.6 F (36.4 C)  TempSrc:    Oral  SpO2: 98% 100% 100% 100%  Weight:      Height:        Intake/Output Summary (Last 24 hours) at 09/05/2021 0904 Last data filed at 09/05/2021 0536 Gross per 24 hour  Intake 619.85 ml  Output 625 ml  Net -5.15 ml   Weight change:  Exam:  General:  Pt  is alert, follows commands appropriately, not in acute distress HEENT: No icterus, No thrush, No neck mass, [place]/AT Cardiovascular: RRR, S1/S2, no rubs, no gallops Respiratory: CTA bilaterally, no wheezing, no crackles, no rhonchi Abdomen: Soft/+BS, non tender, non distended, no guarding Extremities: No edema, No lymphangitis, No  petechiae, No rashes, no synovitis   Data Reviewed: I have personally reviewed following labs and imaging studies Basic Metabolic Panel: Recent Labs  Lab 09/03/21 1934 09/04/21 0500 09/05/21 0549  NA 130* 131* 136  K 5.7* 4.2 3.8  CL 94* 100 108  CO2 16* 16* 18*  GLUCOSE 133* 83 86  BUN 138* 125* 102*  CREATININE 8.08* 6.02* 3.70*  CALCIUM 10.7* 9.8 9.2  MG  --  2.4  --   PHOS  --  6.0*  --    Liver Function Tests: Recent Labs  Lab 09/03/21 1934 09/04/21 0500 09/05/21 0549  AST 13* 12* 20  ALT '20 16 21  '$ ALKPHOS 148* 116 100  BILITOT 0.6 0.6 0.4  PROT 9.7* 7.8 6.9  ALBUMIN 4.3 3.6 3.2*   No results for input(s): "LIPASE", "AMYLASE" in the last 168 hours. No results for input(s): "AMMONIA" in the last 168 hours. Coagulation Profile: No results for input(s): "INR", "PROTIME" in the last 168 hours. CBC: Recent Labs  Lab 09/03/21 1934 09/04/21 0500 09/05/21 0549  WBC 7.6 6.8 6.3  NEUTROABS 4.2  --   --   HGB 14.3 12.7* 11.6*  HCT 42.1 37.5* 33.7*  MCV 90.3 90.4 90.6  PLT 415* 336 270   Cardiac Enzymes: No results for input(s): "CKTOTAL", "CKMB", "CKMBINDEX", "TROPONINI" in the last 168 hours. BNP: Invalid input(s): "POCBNP" CBG: No results for input(s): "GLUCAP" in the last 168 hours. HbA1C: No results for input(s): "HGBA1C" in the last 72 hours. Urine analysis:    Component Value Date/Time   COLORURINE YELLOW 09/03/2021 1921   APPEARANCEUR HAZY (A) 09/03/2021 1921   LABSPEC 1.014 09/03/2021 1921   PHURINE 5.0 09/03/2021 1921   GLUCOSEU NEGATIVE 09/03/2021 1921   HGBUR NEGATIVE 09/03/2021 [place] NEGATIVE 09/03/2021 [place] NEGATIVE 09/03/2021 1921   PROTEINUR 30 (A) 09/03/2021 1921   NITRITE NEGATIVE 09/03/2021 1921   LEUKOCYTESUR NEGATIVE 09/03/2021 1921   Sepsis Labs: '@LABRCNTIP'$ (procalcitonin:4,lacticidven:4) ) Recent Results (from the past 240 hour(s))  Urine Culture     Status: None   Collection Time: 09/03/21  7:21  PM   Specimen: Urine, Clean Catch  Result Value Ref Range Status   Specimen Description   Final    URINE, CLEAN CATCH Performed at [facility], [address], [place], [place] [address]    Special Requests   Final    NONE Performed at [facility], [address], [place], [place] [address]    Culture   Final    NO GROWTH Performed at [facility], [place] [address] [place],  [address]    Report Status 09/05/2021 FINAL  Final     Scheduled Meds:  carvedilol  6.25 mg Oral BID WC   heparin  5,000 Units Subcutaneous Q8H   simvastatin  20 mg Oral QHS   Continuous Infusions:  lactated ringers 100 mL/hr at 09/04/21 1756    Procedures/Studies: US Renal  Result Date: 09/04/2021 CLINICAL DATA:  Azotemia EXAM: RENAL / URINARY TRACT ULTRASOUND COMPLETE COMPARISON:  CT 09/04/2021 FINDINGS: Right Kidney: Renal measurements: 11.4 x 5.4 x 7.2 cm = volume: 231 mL. Increased renal cortical echogenicity. Adjacent cysts within the interpolar region of  the right kidney measuring up to 1.7 cm and 1.6 cm. No solid mass, shadowing stone, or hydronephrosis visualized. Left Kidney: Renal measurements: 5.7 x 2.2 x 3.2 cm = volume: 21 mL. Atrophic kidney with increased cortical echogenicity. 1.2 cm lower pole cyst. No solid mass, shadowing stone or hydronephrosis visualized. Bladder: Appears normal for degree of bladder distention. Other: None. IMPRESSION: 1. No evidence of obstructive uropathy. 2. Echogenic kidneys with left renal atrophy, likely sequela of medical renal disease. 3. Bilateral renal cysts.  No solid renal mass identified. Electronically Signed   By: Davina Poke D.O.   On: 09/04/2021 13:35   CT Renal Stone Study  Result Date: 09/04/2021 CLINICAL DATA:  71 year old male under evaluation for potential hydronephrosis. EXAM: CT ABDOMEN AND PELVIS WITHOUT CONTRAST TECHNIQUE: Multidetector CT imaging of the abdomen and pelvis was performed following the standard protocol without IV  contrast. RADIATION DOSE REDUCTION: This exam was performed according to the departmental dose-optimization program which includes automated exposure control, adjustment of the mA and/or kV according to patient size and/or use of iterative reconstruction technique. COMPARISON:  PET-CT 06/10/2021. FINDINGS: Lower chest: Emphysema. Atherosclerotic calcifications in the descending thoracic aorta as well as the left main, left anterior descending, left circumflex and right coronary arteries. Pacemaker leads in the right atrium and right ventricle. Hepatobiliary: No definite suspicious cystic or solid hepatic lesions are confidently identified on today's noncontrast CT examination. There is a subcentimeter low-attenuation lesion in the posterior aspect of the right lobe of the liver, incompletely characterized on today's non-contrast CT examination, but statistically likely a small cyst. Unenhanced appearance of the gallbladder is unremarkable. Pancreas: No definite pancreatic mass or peripancreatic fluid collections or inflammatory changes are noted on today's noncontrast CT examination. Spleen: Unremarkable. Adrenals/Urinary Tract: Severe left renal atrophy. Multiple lesions in the right kidney, incompletely characterized on today's noncontrast CT examination, but ranging from low to intermediate to high attenuation. The largest of these lesions is an exophytic intermediate attenuation lesion in the lateral aspect of the interpolar region of the right kidney measuring 1.7 cm in diameter (axial image 34 of series 2). These lesions are grossly similar to prior CT 04/08/2021, likely a combination of cysts and mildly proteinaceous/hemorrhagic cysts. No hydroureteronephrosis. Urinary bladder is normal in appearance. Stomach/Bowel: The unenhanced appearance of the stomach is normal. No pathologic dilatation of small bowel or colon. Postoperative changes of partial colectomy are noted. 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 No lymphadenopathy noted in the abdomen or pelvis. Reproductive: Prostate gland and seminal vesicles are unremarkable in appearance. Other: No significant volume of ascites.  No pneumoperitoneum. Musculoskeletal: There are no aggressive appearing lytic or blastic lesions noted in the visualized portions of the skeleton. IMPRESSION: 1. No hydroureteronephrosis. 2. Multiple lesions in the right kidney varying degrees of complexity, likely to represent proteinaceous/hemorrhagic cysts and simple cysts, but incompletely characterized on today's noncontrast CT examination. These could be further characterized with follow-up nonemergent abdominal MRI with and without IV gadolinium if of clinical concern. 3. Severe atrophy of the left kidney, unchanged. 4. Emphysema. 5. Aortic atherosclerosis, in addition to left main and three-vessel coronary artery disease. 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 6. Additional  incidental findings, as above. Electronically Signed   By: Vinnie Langton M.D.   On: 09/04/2021 07:20   CUP PACEART REMOTE DEVICE CHECK  Result Date: 08/12/2021 Scheduled remote reviewed. Normal device function.  1 AMS, PAT, 8sec in duration Next remote 91 days. LA   Orson Eva, DO  [facility]  If 7PM-7AM, please contact night-coverage [email or web address] Password [email or web address] 09/05/2021, 9:04 AM   LOS: 2 days

## 2021-09-05 NOTE — Assessment & Plan Note (Signed)
Elevated troponin Presented with troponin 29 No chest pain presently Due to demand ischemia Personally reviewed EKG--V paced

## 2021-09-05 NOTE — Assessment & Plan Note (Addendum)
Baseline creatinine 1.6-1.8 Patient presented with serum creatinine 8.08 Due to prerenal azotemia with possible component of ATN Appreciate nephrology, Dr. Johnney Ou Renal ultrasound negative for hydronephrosis Continue IV fluids>>saline lock on 7/16 Serum creatinine

## 2021-09-05 NOTE — Progress Notes (Addendum)
[facility] Progress Note   Subjective:   Feeling much better -- po intake much improved, denies dysgeusia.  Good breakfast and lunch.  Normal UOP now - UOP 639m yesterday; at least 4044min urinal now.  NO LUTs.  Still with 4-5 watery BMs/day.   Objective Vitals:   09/04/21 0416 09/04/21 1454 09/04/21 2106 09/05/21 0508  BP: 122/76 134/79 140/68 117/76  Pulse: 81 77 77 72  Resp: '19 20 20 18  '$ Temp: 98.5 F (36.9 C)   97.6 F (36.4 C)  TempSrc:    Oral  SpO2: 98% 100% 100% 100%  Weight:      Height:       Physical Exam Gen: sitting comfortably eating lunch Eyes:  EOMI ENT: MMM Neck: supple, no JVD CV:  RRR Abd: soft, +BS, well healed incisions Lungs: clear GU: no foley Extr: no edema Neuro: nonfocal  Additional Objective Labs: Basic Metabolic Panel: Recent Labs  Lab 09/03/21 1934 09/04/21 0500 09/05/21 0549  NA 130* 131* 136  K 5.7* 4.2 3.8  CL 94* 100 108  CO2 16* 16* 18*  GLUCOSE 133* 83 86  BUN 138* 125* 102*  CREATININE 8.08* 6.02* 3.70*  CALCIUM 10.7* 9.8 9.2  PHOS  --  6.0*  --    Liver Function Tests: Recent Labs  Lab 09/03/21 1934 09/04/21 0500 09/05/21 0549  AST 13* 12* 20  ALT '20 16 21  '$ ALKPHOS 148* 116 100  BILITOT 0.6 0.6 0.4  PROT 9.7* 7.8 6.9  ALBUMIN 4.3 3.6 3.2*   No results for input(s): "LIPASE", "AMYLASE" in the last 168 hours. CBC: Recent Labs  Lab 09/03/21 1934 09/04/21 0500 09/05/21 0549  WBC 7.6 6.8 6.3  NEUTROABS 4.2  --   --   HGB 14.3 12.7* 11.6*  HCT 42.1 37.5* 33.7*  MCV 90.3 90.4 90.6  PLT 415* 336 270   Blood Culture    Component Value Date/Time   SDES  09/03/2021 1921    URINE, CLEAN CATCH Performed at [facility] [place] [id number]  [place] 1921    NONE [facility]formed at [facility] [place] [id number]  [place]14/2023 1921    NO GROWTH Performed at [address] [place] [id number]   REPTSTATUS 09/05/2021 FINAL 09/03/2021 1921    Cardiac Enzymes: No results for input(s): "CKTOTAL", "CKMB", "CKMBINDEX", "TROPONINI" in the last 168 hours. CBG: No results for input(s): "GLUCAP" in the last 168 hours. Iron Studies: No results for input(s): "IRON", "TIBC", "TRANSFERRIN", "FERRITIN" in the last 72 hours. '@lablastinr3'$ @ Studies/Results: [place]  Result Date: 09/04/2021 CLINICAL DATA:  Azotemia EXAM: RENAL / URINARY TRACT ULTRASOUND COMPLETE COMPARISON:  CT 09/04/2021 FINDINGS: Right Kidney: Renal measurements: 11.4 x 5.4 x 7.2 cm = volume: 231 mL. Increased renal cortical echogenicity. Adjacent cysts within the interpolar region of the right kidney measuring up to 1.7 cm and 1.6 cm. No solid mass, shadowing stone, or hydronephrosis visualized. Left Kidney: Renal measurements: 5.7 x 2.2 x 3.2 cm = volume: 21 mL. Atrophic kidney with increased cortical echogenicity. 1.2 cm lower pole cyst. No solid mass, shadowing stone or hydronephrosis visualized. Bladder: Appears normal for degree of bladder distention. Other: None. IMPRESSION: 1. No evidence of obstructive uropathy. 2. Echogenic kidneys with left renal atrophy, likely sequela of medical renal disease. 3. Bilateral renal cysts.  No solid renal mass identified. Electronically Signed  By: Davina Poke D.O.   On: 09/04/2021 13:35   CT Renal Stone Study  Result Date: 09/04/2021 CLINICAL DATA:  71 year old male under evaluation for potential hydronephrosis. EXAM: CT ABDOMEN AND PELVIS WITHOUT CONTRAST TECHNIQUE: Multidetector CT imaging of the abdomen and pelvis was performed following the standard protocol without IV contrast. RADIATION DOSE REDUCTION: This exam was performed according to the departmental dose-optimization program which includes automated exposure control, adjustment of the mA and/or kV according to patient size and/or use of iterative reconstruction technique. COMPARISON:  PET-CT 06/10/2021. FINDINGS: Lower chest:  Emphysema. Atherosclerotic calcifications in the descending thoracic aorta as well as the left main, left anterior descending, left circumflex and right coronary arteries. Pacemaker leads in the right atrium and right ventricle. Hepatobiliary: No definite suspicious cystic or solid hepatic lesions are confidently identified on today's noncontrast CT examination. There is a subcentimeter low-attenuation lesion in the posterior aspect of the right lobe of the liver, incompletely characterized on today's non-contrast CT examination, but statistically likely a small cyst. Unenhanced appearance of the gallbladder is unremarkable. Pancreas: No definite pancreatic mass or peripancreatic fluid collections or inflammatory changes are noted on today's noncontrast CT examination. Spleen: Unremarkable. Adrenals/Urinary Tract: Severe left renal atrophy. Multiple lesions in the right kidney, incompletely characterized on today's noncontrast CT examination, but ranging from low to intermediate to high attenuation. The largest of these lesions is an exophytic intermediate attenuation lesion in the lateral aspect of the interpolar region of the right kidney measuring 1.7 cm in diameter (axial image 34 of series 2). These lesions are grossly similar to prior CT 04/08/2021, likely a combination of cysts and mildly proteinaceous/hemorrhagic cysts. No hydroureteronephrosis. Urinary bladder is normal in appearance. Stomach/Bowel: The unenhanced appearance of the stomach is normal. No pathologic dilatation of small bowel or colon. Postoperative changes of partial colectomy are noted. 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 No lymphadenopathy noted in the abdomen or pelvis. Reproductive: Prostate gland and seminal vesicles are unremarkable in  appearance. Other: No significant volume of ascites.  No pneumoperitoneum. Musculoskeletal: There are no aggressive appearing lytic or blastic lesions noted in the visualized portions of the skeleton. IMPRESSION: 1. No hydroureteronephrosis. 2. Multiple lesions in the right kidney varying degrees of complexity, likely to represent proteinaceous/hemorrhagic cysts and simple cysts, but incompletely characterized on today's noncontrast CT examination. These could be further characterized with follow-up nonemergent abdominal MRI with and without IV gadolinium if of clinical concern. 3. Severe atrophy of the left kidney, unchanged. 4. Emphysema. 5. Aortic atherosclerosis, in addition to left main and three-vessel coronary artery disease. 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 6. Additional incidental findings, as above. Electronically Signed   By: Vinnie Langton M.D.   On: 09/04/2021 07:20   Medications:  lactated ringers 100 mL/hr at 09/04/21 1756    carvedilol  6.25 mg Oral BID WC   heparin  5,000 Units Subcutaneous Q8H   simvastatin  20 mg Oral QHS   Assessment/Plan:  Fernando Dean is an 71 y.o. male with HTN, CKD 3 baseline Cr 1.8-2, h/o CHF s/p PPM, h/o colon cancer s/p resection 02/2021 and another resection 07/2021 who is seen for evaluation and management of  AKI on CKD.    **severe AKI on CKD 3b:  Baseline Cr 2 presented with Cr 8 2 wks after partial colectomy accompanied by watery diarrhea and poor po intake.    Suspect was prerenal with frequent stools, got AKI and uremic anorexia.  CT/Renal US without obstruction but noted atrophic L kidney.  UA 1+ protein o/w bland - inconsistent with GN.  Improving with hydration with Cr to 3.7 today .  No current indications for RRT.  With po intake normal now D/C IVF and see if he can maintain hydration orally  -- frequent loose stools may need to be addressed as well.  Strict I/Os, daily weights.  Avoid nephrotoxins.  Sister says has already been referred to outpt nephrology.  Will need to confirm at d/c as he'll need a nephrologist going forward.    **Hyperkalemia: resolved with hydration   **Metabolic acidosis: secondary to AKI, improving   **Hyponatremia:  hypovolemic; resolved with IVF   **s/p recent colectomy:  4-5 water BMs/day, may end up needing tx to slow stooling.    **R renal lesions: CT read stable c/w 03/2021 suspected cysts but further imaging recommended - MRI w and w/o gad rec --> would need GFR back to baseline for gad.  OK to defer until more stable/outpt.  The renal US done yesterday said no solid masses - looks like BL cysts, prob could go without MRI.     Will follow, call with questions/concerns.   Jannifer Hick MD 09/05/2021, 12:59 PM  [facility] Pager: [phone]

## 2021-09-06 DIAGNOSIS — E871 Hypo-osmolality and hyponatremia: Secondary | ICD-10-CM | POA: Diagnosis not present

## 2021-09-06 DIAGNOSIS — I442 Atrioventricular block, complete: Secondary | ICD-10-CM | POA: Diagnosis not present

## 2021-09-06 DIAGNOSIS — N179 Acute kidney failure, unspecified: Secondary | ICD-10-CM | POA: Diagnosis not present

## 2021-09-06 DIAGNOSIS — R778 Other specified abnormalities of plasma proteins: Secondary | ICD-10-CM | POA: Diagnosis not present

## 2021-09-06 LAB — COMPREHENSIVE METABOLIC PANEL
ALT: 34 U/L (ref 0–44)
AST: 31 U/L (ref 15–41)
Albumin: 3.2 g/dL — ABNORMAL LOW (ref 3.5–5.0)
Alkaline Phosphatase: 99 U/L (ref 38–126)
Anion gap: 9 (ref 5–15)
BUN: 79 mg/dL — ABNORMAL HIGH (ref 8–23)
CO2: 20 mmol/L — ABNORMAL LOW (ref 22–32)
Calcium: 8.9 mg/dL (ref 8.9–10.3)
Chloride: 107 mmol/L (ref 98–111)
Creatinine, Ser: 2.76 mg/dL — ABNORMAL HIGH (ref 0.61–1.24)
GFR, Estimated: 24 mL/min — ABNORMAL LOW (ref >60)
Glucose, Bld: 91 mg/dL (ref 70–99)
Potassium: 3.8 mmol/L (ref 3.5–5.1)
Sodium: 136 mmol/L (ref 135–145)
Total Bilirubin: 0.5 mg/dL (ref 0.3–1.2)
Total Protein: 7.1 g/dL (ref 6.5–8.1)

## 2021-09-06 LAB — MAGNESIUM: Magnesium: 2.2 mg/dL (ref 1.7–2.4)

## 2021-09-06 LAB — PHOSPHORUS: Phosphorus: 3.6 mg/dL (ref 2.5–4.6)

## 2021-09-06 MED ORDER — AMLODIPINE BESYLATE 2.5 MG PO TABS: 2.5000 mg | ORAL_TABLET | Freq: Every day | ORAL | 1 refills | Status: DC

## 2021-09-06 MED ORDER — SODIUM CHLORIDE 0.9 % IV BOLUS
1000.0000 mL | Freq: Once | INTRAVENOUS | Status: AC
  Administered 2021-09-06: 1000 mL via INTRAVENOUS

## 2021-09-06 NOTE — TOC Transition Note (Signed)
Transition of Care [facility]) - CM/SW Discharge Note   Patient Details  Name: Fernando Dean MRN: [id number] Date of Birth: 11/25/1950  [facility]) CM/SW Contact:  Boneta Lucks, RN Phone Number: 09/06/2021, 1:42 PM   Clinical Narrative:  Patient admitted with acute renal failure. Patient states he does not live alone. He drives occasionally, he walks with a cane some days. He has an ostomy, no issues getting his supplies. PT had not follow up recommendations. Patient discharging home today.     Final next level of care: Home/Self Care Barriers to Discharge: No Barriers Identified   Patient Goals and CMS Choice Patient states their goals for this hospitalization and ongoing recovery are:: to go home. CMS Medicare.gov Compare Post Acute Care list provided to:: Patient Choice offered to / list presented to : Patient  Discharge Placement            Patient and family notified of of transfer: 09/06/21  Discharge Plan and Services   Readmission Risk Interventions    09/06/2021    1:41 PM 09/06/2021    1:40 PM  Readmission Risk Prevention Plan  Transportation Screening  Complete  PCP or Specialist Appt within 3-5 Days Not Complete   HRI or [place] Complete   Social Work Consult for [place] Planning/Counseling Complete   Palliative Care Screening Not Applicable   Medication Review Press photographer) Complete

## 2021-09-06 NOTE — Progress Notes (Signed)
Patient ID: Fernando Dean, male   DOB: [birth year], 71 y.o.   MRN: [id number] S: Feels much better and is eating breakfast O:BP (!) 141/71 (BP Location: Right Arm)   Pulse 66   Temp 97.6 F (36.4 C) (Oral)   Resp 18   Ht 6' (1.829 m)   Wt 71.7 kg   SpO2 100%   BMI 21.43 kg/m   Intake/Output Summary (Last 24 hours) at 09/06/2021 0859 Last data filed at 09/06/2021 0500 Gross per 24 hour  Intake 2571.68 ml  Output 600 ml  Net 1971.68 ml   Intake/Output: I/O last 3 completed shifts: In: 2571.7 [P.O.:480; I.V.:2091.7] Out: 1225 [Urine:1225]  Intake/Output this shift:  No intake/output data recorded. Weight change:  Gen:NAD CVS: RRR Resp:CTA Abd: +BS, soft, NT/ND Ext: no edema  Recent Labs  Lab 09/03/21 1934 09/04/21 0500 09/05/21 0549 09/06/21 0503  NA 130* 131* 136 136  K 5.7* 4.2 3.8 3.8  CL 94* 100 108 107  CO2 16* 16* 18* 20*  GLUCOSE 133* 83 86 91  BUN 138* 125* 102* 79*  CREATININE 8.08* 6.02* 3.70* 2.76*  ALBUMIN 4.3 3.6 3.2* 3.2*  CALCIUM 10.7* 9.8 9.2 8.9  PHOS  --  6.0*  --  3.6  AST 13* 12* 20 31  ALT _0 34   Liver Function Tests: Recent Labs  Lab 09/04/21 0500 09/05/21 0549 09/06/21 0503  AST 12* 20 31  ALT 16 21 34  ALKPHOS 116 100 99  BILITOT 0.6 0.4 0.5  PROT 7.8 6.9 7.1  ALBUMIN 3.6 3.2* 3.2*   No results for input(s): "LIPASE", "AMYLASE" in the last 168 hours. No results for input(s): "AMMONIA" in the last 168 hours. CBC: Recent Labs  Lab 09/03/21 1934 09/04/21 0500 09/05/21 0549  WBC 7.6 6.8 6.3  NEUTROABS 4.2  --   --   HGB 14.3 12.7* 11.6*  HCT 42.1 37.5* 33.7*  MCV 90.3 90.4 90.6  PLT 415* 336 270   Cardiac Enzymes: No results for input(s): "CKTOTAL", "CKMB", "CKMBINDEX", "TROPONINI" in the last 168 hours. CBG: No results for input(s): "GLUCAP" in the last 168 hours.  Iron Studies: No results for input(s): "IRON", "TIBC", "TRANSFERRIN", "FERRITIN" in the last 72 hours. Studies/Results: US Renal  Result Date:  09/04/2021 CLINICAL DATA:  Azotemia EXAM: RENAL / URINARY TRACT ULTRASOUND COMPLETE COMPARISON:  CT 09/04/2021 FINDINGS: Right Kidney: Renal measurements: 11.4 x 5.4 x 7.2 cm = volume: 231 mL. Increased renal cortical echogenicity. Adjacent cysts within the interpolar region of the right kidney measuring up to 1.7 cm and 1.6 cm. No solid mass, shadowing stone, or hydronephrosis visualized. Left Kidney: Renal measurements: 5.7 x 2.2 x 3.2 cm = volume: 21 mL. Atrophic kidney with increased cortical echogenicity. 1.2 cm lower pole cyst. No solid mass, shadowing stone or hydronephrosis visualized. Bladder: Appears normal for degree of bladder distention. Other: None. IMPRESSION: 1. No evidence of obstructive uropathy. 2. Echogenic kidneys with left renal atrophy, likely sequela of medical renal disease. 3. Bilateral renal cysts.  No solid renal mass identified. Electronically Signed   By: Davina Poke D.O.   On: 09/04/2021 13:35    carvedilol  6.25 mg Oral BID WC   feeding supplement  237 mL Oral TID BM   heparin  5,000 Units Subcutaneous Q8H   multivitamin with minerals  1 tablet Oral Daily   simvastatin  20 mg Oral QHS    BMET    Component Value Date/Time   NA 136 09/06/2021 0503  K 3.8 09/06/2021 0503   CL 107 09/06/2021 0503   CO2 20 (L) 09/06/2021 0503   GLUCOSE 91 09/06/2021 0503   BUN 79 (H) 09/06/2021 0503   CREATININE 2.76 (H) 09/06/2021 0503   CALCIUM 8.9 09/06/2021 0503   GFRNONAA 24 (L) 09/06/2021 0503   GFRAA  10/23/2006 2125    >60        The eGFR has been calculated using the MDRD equation. This calculation has not been validated in all clinical   CBC    Component Value Date/Time   WBC 6.3 09/05/2021 0549   RBC 3.72 (L) 09/05/2021 0549   HGB 11.6 (L) 09/05/2021 0549   HCT 33.7 (L) 09/05/2021 0549   PLT 270 09/05/2021 0549   MCV 90.6 09/05/2021 0549   MCH 31.2 09/05/2021 0549   MCHC 34.4 09/05/2021 0549   RDW 15.6 (H) 09/05/2021 0549   LYMPHSABS 2.7 09/03/2021  1934   MONOABS 0.6 09/03/2021 1934   EOSABS 0.1 09/03/2021 1934   BASOSABS 0.0 09/03/2021 1934   Fernando Dean is an 71 y.o. male with HTN, CKD 3 baseline Cr 1.8-2, h/o CHF s/p PPM, h/o colon cancer s/p resection 02/2021 and another resection 07/2021 who is seen for evaluation and management of AKI on CKD.  Assessment/Plan:  AKI/CKD stage IIIa - presumably due to ischemic ATN in setting of volume depletion following partial colectomy on 08/10/21.  Scr peaked at 8 and has improved steadily with IVF's.  Now taking po.  Nothing further to add, will sign off.  Please call with any questions or concerns.  Will arrange for follow up in our office if not already scheduled with Nephrology. Hyperkalemia - resolved Hypovolemic hyponatremia - resolved. S/p colectomy - improved watery stools Right renal lesions - suspected cysts.  Will need MRI with and w/o contrast once his Scr returns to his baseline.  Donetta Potts, MD [facility]

## 2021-09-06 NOTE — Plan of Care (Signed)

## 2021-09-06 NOTE — Evaluation (Signed)
Physical Therapy Evaluation Patient Details Name: Fernando Dean MRN: [id number] DOB: [birth year] Today's Date: 09/06/2021  History of Present Illness  Fernando Dean is a 71 y.o. 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  Patient was unable to provide a history, history was obtained from ED physician and family at bedside.  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  Niece at bedside states that patient was doing well after the surgery and for about 4 to 5 days after the surgery, but after this, patient barely eats due to lack of appetite and fluid intake was also greatly reduced.  Urinary output has decreased tremendously especially in the last 2 to 3 days whereby he has not urinated.  He went to his PCP, lab work was done and he was told to go to the ED for further evaluation and management based on abnormal labs.  Patient denies chest pain, abdominal pain, shortness of breath, fever, headache.   Clinical Impression  Patient functioning near baseline for functional mobility and gait demonstrating good return for bed mobility, transfers and ambulation in room/hallways without loss of balance.  Patient encouraged to ambulate ad lib and with nursing staff as tolerated for length of stay.  Plan:  Patient discharged from physical therapy to care of nursing for ambulation daily as tolerated for length of stay.         Recommendations for follow up therapy are one component of a multi-disciplinary discharge planning process, led by the attending physician.  Recommendations may be updated based on patient status, additional functional  criteria and insurance authorization.  Follow Up Recommendations No PT follow up      Assistance Recommended at Discharge PRN  Patient can return home with the following  Help with stairs or ramp for entrance;Assistance with cooking/housework    Equipment Recommendations    Recommendations for Other Services       Functional Status Assessment Patient has not had a recent decline in their functional status     Precautions / Restrictions Precautions Precautions: None Restrictions Weight Bearing Restrictions: No      Mobility  Bed Mobility Overal bed mobility: Independent                  Transfers Overall transfer level: Modified independent                      Ambulation/Gait Ambulation/Gait assistance: Modified independent (Device/Increase time) Gait Distance (Feet): 150 Feet Assistive device: None Gait Pattern/deviations: WFL(Within Functional Limits) Gait velocity: slightly decreased     General Gait Details: grossly WFL demonstrating good return for ambulation in room/hallway without loss of balance  Stairs            Wheelchair Mobility    Modified Rankin (Stroke Patients Only)       Balance Overall balance assessment: No apparent balance deficits (not formally assessed)                                           Pertinent Vitals/Pain Pain Assessment Pain  Assessment: No/denies pain    Home Living Family/patient expects to be discharged to:: Private residence Living Arrangements: Other relatives Available Help at Discharge: Family;Available 24 hours/day Type of Home: House Home Access: Stairs to enter Entrance Stairs-Rails: None Entrance Stairs-Number of Steps: 2   Home Layout: One level Home Equipment: Conservation officer, nature (2 wheels);Cane - single point      Prior Function Prior Level of Function : Independent/Modified Independent                     Hand Dominance        Extremity/Trunk  Assessment   Upper Extremity Assessment Upper Extremity Assessment: Overall WFL for tasks assessed    Lower Extremity Assessment Lower Extremity Assessment: Overall WFL for tasks assessed    Cervical / Trunk Assessment Cervical / Trunk Assessment: Normal  Communication   Communication: No difficulties  Cognition Arousal/Alertness: Awake/alert Behavior During Therapy: WFL for tasks assessed/performed Overall Cognitive Status: Within Functional Limits for tasks assessed                                          General Comments      Exercises     Assessment/Plan    PT Assessment Patient does not need any further PT services  PT Problem List         PT Treatment Interventions      PT Goals (Current goals can be found in the Care Plan section)  Acute Rehab PT Goals Patient Stated Goal: return home with family to assist PT Goal Formulation: With patient Time For Goal Achievement: 09/06/21 Potential to Achieve Goals: Good    Frequency       Co-evaluation               AM-PAC PT "6 Clicks" Mobility  Outcome Measure Help needed turning from your back to your side while in a flat bed without using bedrails?: None Help needed moving from lying on your back to sitting on the side of a flat bed without using bedrails?: None Help needed moving to and from a bed to a chair (including a wheelchair)?: None Help needed standing up from a chair using your arms (e.g., wheelchair or bedside chair)?: None Help needed to walk in hospital room?: None Help needed climbing 3-5 steps with a railing? : None 6 Click Score: 24    End of Session   Activity Tolerance: Patient tolerated treatment well Patient left: in bed;with call bell/phone within reach Nurse Communication: Mobility status PT Visit Diagnosis: Unsteadiness on feet (R26.81);Other abnormalities of gait and mobility (R26.89);Muscle weakness (generalized) (M62.81)    Time: 7782-4235 PT Time  Calculation (min) (ACUTE ONLY): 15 min   Charges:   PT Evaluation $PT Eval Low Complexity: 1 Low PT Treatments $Therapeutic Activity: 8-22 mins        11:30 AM, 09/06/21 Lonell Grandchild, MPT Physical Therapist with [facility] [phone] office [phone] mobile phone

## 2021-09-09 DIAGNOSIS — N179 Acute kidney failure, unspecified: Secondary | ICD-10-CM | POA: Diagnosis not present

## 2021-09-09 DIAGNOSIS — E781 Pure hyperglyceridemia: Secondary | ICD-10-CM | POA: Diagnosis not present

## 2021-09-09 DIAGNOSIS — C185 Malignant neoplasm of splenic flexure: Secondary | ICD-10-CM | POA: Diagnosis not present

## 2021-09-09 DIAGNOSIS — R748 Abnormal levels of other serum enzymes: Secondary | ICD-10-CM | POA: Diagnosis not present

## 2021-09-09 DIAGNOSIS — I10 Essential (primary) hypertension: Secondary | ICD-10-CM | POA: Diagnosis not present

## 2021-09-09 DIAGNOSIS — N1831 Chronic kidney disease, stage 3a: Secondary | ICD-10-CM | POA: Diagnosis not present

## 2021-09-09 DIAGNOSIS — Z950 Presence of cardiac pacemaker: Secondary | ICD-10-CM | POA: Diagnosis not present

## 2021-09-09 DIAGNOSIS — I714 Abdominal aortic aneurysm, without rupture, unspecified: Secondary | ICD-10-CM | POA: Diagnosis not present

## 2021-09-09 DIAGNOSIS — F17200 Nicotine dependence, unspecified, uncomplicated: Secondary | ICD-10-CM | POA: Diagnosis not present

## 2021-09-09 DIAGNOSIS — E785 Hyperlipidemia, unspecified: Secondary | ICD-10-CM | POA: Diagnosis not present

## 2021-09-09 DIAGNOSIS — R197 Diarrhea, unspecified: Secondary | ICD-10-CM | POA: Diagnosis not present

## 2021-09-15 ENCOUNTER — Other Ambulatory Visit (HOSPITAL_COMMUNITY): Payer: Self-pay

## 2021-09-15 DIAGNOSIS — C186 Malignant neoplasm of descending colon: Secondary | ICD-10-CM

## 2021-09-16 ENCOUNTER — Inpatient Hospital Stay (HOSPITAL_COMMUNITY): Payer: Medicare Other | Attending: Hematology

## 2021-09-16 ENCOUNTER — Ambulatory Visit: Payer: Medicare Other | Admitting: Internal Medicine

## 2021-09-16 DIAGNOSIS — C186 Malignant neoplasm of descending colon: Secondary | ICD-10-CM | POA: Diagnosis not present

## 2021-09-16 LAB — CBC WITH DIFFERENTIAL/PLATELET
Abs Immature Granulocytes: 0.01 10*3/uL (ref 0.00–0.07)
Basophils Absolute: 0.1 10*3/uL (ref 0.0–0.1)
Basophils Relative: 1 %
Eosinophils Absolute: 0.4 10*3/uL (ref 0.0–0.5)
Eosinophils Relative: 6 %
HCT: 37 % — ABNORMAL LOW (ref 39.0–52.0)
Hemoglobin: 11.9 g/dL — ABNORMAL LOW (ref 13.0–17.0)
Immature Granulocytes: 0 %
Lymphocytes Relative: 37 %
Lymphs Abs: 2.4 10*3/uL (ref 0.7–4.0)
MCH: 30.7 pg (ref 26.0–34.0)
MCHC: 32.2 g/dL (ref 30.0–36.0)
MCV: 95.6 fL (ref 80.0–100.0)
Monocytes Absolute: 0.6 10*3/uL (ref 0.1–1.0)
Monocytes Relative: 9 %
Neutro Abs: 3.1 10*3/uL (ref 1.7–7.7)
Neutrophils Relative %: 47 %
Platelets: 251 10*3/uL (ref 150–400)
RBC: 3.87 MIL/uL — ABNORMAL LOW (ref 4.22–5.81)
RDW: 15.2 % (ref 11.5–15.5)
WBC: 6.5 10*3/uL (ref 4.0–10.5)
nRBC: 0 % (ref 0.0–0.2)

## 2021-09-16 LAB — COMPREHENSIVE METABOLIC PANEL
ALT: 36 U/L (ref 0–44)
AST: 23 U/L (ref 15–41)
Albumin: 3.6 g/dL (ref 3.5–5.0)
Alkaline Phosphatase: 113 U/L (ref 38–126)
Anion gap: 5 (ref 5–15)
BUN: 42 mg/dL — ABNORMAL HIGH (ref 8–23)
CO2: 20 mmol/L — ABNORMAL LOW (ref 22–32)
Calcium: 9.3 mg/dL (ref 8.9–10.3)
Chloride: 112 mmol/L — ABNORMAL HIGH (ref 98–111)
Creatinine, Ser: 2.12 mg/dL — ABNORMAL HIGH (ref 0.61–1.24)
GFR, Estimated: 33 mL/min — ABNORMAL LOW (ref >60)
Glucose, Bld: 101 mg/dL — ABNORMAL HIGH (ref 70–99)
Potassium: 5.1 mmol/L (ref 3.5–5.1)
Sodium: 137 mmol/L (ref 135–145)
Total Bilirubin: 0.4 mg/dL (ref 0.3–1.2)
Total Protein: 8 g/dL (ref 6.5–8.1)

## 2021-09-17 LAB — CEA: CEA: 5.7 ng/mL — ABNORMAL HIGH (ref 0.0–4.7)

## 2021-09-20 DIAGNOSIS — N1831 Chronic kidney disease, stage 3a: Secondary | ICD-10-CM | POA: Diagnosis not present

## 2021-09-20 DIAGNOSIS — E785 Hyperlipidemia, unspecified: Secondary | ICD-10-CM | POA: Diagnosis not present

## 2021-09-20 DIAGNOSIS — I129 Hypertensive chronic kidney disease with stage 1 through stage 4 chronic kidney disease, or unspecified chronic kidney disease: Secondary | ICD-10-CM | POA: Diagnosis not present

## 2021-09-22 ENCOUNTER — Encounter: Payer: Medicare Other | Admitting: Internal Medicine

## 2021-09-23 ENCOUNTER — Inpatient Hospital Stay: Payer: Medicare Other | Attending: Hematology | Admitting: Hematology

## 2021-09-23 VITALS — BP 148/86 | HR 63 | Temp 98.3°F | Resp 18 | Ht 72.0 in | Wt 165.3 lb

## 2021-09-23 DIAGNOSIS — C183 Malignant neoplasm of hepatic flexure: Secondary | ICD-10-CM | POA: Insufficient documentation

## 2021-09-23 DIAGNOSIS — Z9049 Acquired absence of other specified parts of digestive tract: Secondary | ICD-10-CM | POA: Diagnosis not present

## 2021-09-23 DIAGNOSIS — Z5111 Encounter for antineoplastic chemotherapy: Secondary | ICD-10-CM | POA: Insufficient documentation

## 2021-09-23 DIAGNOSIS — C186 Malignant neoplasm of descending colon: Secondary | ICD-10-CM

## 2021-09-23 DIAGNOSIS — D649 Anemia, unspecified: Secondary | ICD-10-CM | POA: Diagnosis not present

## 2021-09-23 DIAGNOSIS — F17210 Nicotine dependence, cigarettes, uncomplicated: Secondary | ICD-10-CM | POA: Diagnosis not present

## 2021-09-23 DIAGNOSIS — Z800 Family history of malignant neoplasm of digestive organs: Secondary | ICD-10-CM | POA: Diagnosis not present

## 2021-09-23 NOTE — Progress Notes (Signed)
ERRONEOUS ENCOUNTER

## 2021-09-23 NOTE — Progress Notes (Signed)
START ON PATHWAY REGIMEN - Colorectal     A cycle is every 14 days:     Oxaliplatin      Leucovorin      Fluorouracil      Fluorouracil   **Always confirm dose/schedule in your pharmacy ordering system**  Patient Characteristics: Postoperative without Neoadjuvant Therapy, M0 (Pathologic Staging), Colon, Stage III, High Risk (pT4 or pN2) Tumor Location: Colon Therapeutic Status: Postoperative without Neoadjuvant Therapy, M0 (Pathologic Staging) AJCC M Category: cM0 AJCC T Category: pT4a AJCC N Category: pN1 AJCC 8 Stage Grouping: IIIB Intent of Therapy: Curative Intent, Discussed with Patient

## 2021-09-23 NOTE — Progress Notes (Signed)
[name], [name] [address]   CLINIC:  Medical Oncology/Hematology  PCP:  [name], FNP [name] [address] [phone]   REASON FOR VISIT:  Follow-up for stage 3B right colon cancer  PRIOR THERAPY: Resection of splenic flexure mass on 04/23/2021 by Dr. Marcello Moores, right colectomy on 08/18/2021  NGS Results:  MSI-stable  CURRENT THERAPY: Surveillance   CANCER STAGING:  Cancer Staging  Cancer of left colon [facility]) Staging form: Colon and Rectum, AJCC 8th Edition - Clinical stage from 05/11/2021: Stage IIA (cT3, cN0, cM0) - Unsigned  Primary cancer of hepatic flexure s/p robotic right colectomy 08/18/2021 Staging form: Colon and Rectum, AJCC 8th Edition - Clinical stage from 09/23/2021: Stage IIIB (cT4a, cN1a, cM0) - Unsigned   INTERVAL HISTORY:  [name], a 71 y.o. 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 Nhan was last seen on 06/17/2021.  Today he reports feeling good. He has lost 6 lbs since his last visit. He denies abnormal tingling/numbness or cold sensation.    REVIEW OF SYSTEMS:  Review of Systems  Constitutional:  Positive for unexpected weight change (-6 lbs). Negative for appetite change and fatigue.  Neurological:  Negative for numbness.  All other systems reviewed and are negative.   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 - [employer] MRI 2272 dual chamber PPM   Tuberculosis    as a child, was treated   Past Surgical History:  Procedure Laterality Date   ABDOMINAL AORTIC ENDOVASCULAR STENT GRAFT Bilateral 02/26/2021    Procedure: ABDOMINAL AORTIC ENDOVASCULAR STENT GRAFT REPAIR;  Surgeon: Marty Heck, MD;  Location: [facility] OR;  Service: Vascular;  Laterality: Bilateral;   BIOPSY  03/12/2021   Procedure: BIOPSY;  Surgeon: Harvel Quale, MD;  [facility];  Service: Gastroenterology;;   BIOPSY  06/25/2021   Procedure: BIOPSY;  Surgeon: Harvel Quale, MD;  [facility];  Service: Gastroenterology;;  mass    COLONOSCOPY WITH PROPOFOL N/A 03/12/2021   Procedure: COLONOSCOPY WITH PROPOFOL;  Surgeon: Harvel Quale, MD;  [facility];  Service: Gastroenterology;  Laterality: N/A;  10:55   COLONOSCOPY WITH PROPOFOL N/A 06/25/2021   Procedure: COLONOSCOPY WITH PROPOFOL;  Surgeon: Harvel Quale, MD;  [facility];  Service: Gastroenterology;  Laterality: N/A;  235 ASA 2   HEMOSTASIS CLIP PLACEMENT  03/12/2021   Procedure: HEMOSTASIS CLIP PLACEMENT;  Surgeon: Harvel Quale, MD;  [facility];  Service: Gastroenterology;;   HEMOSTASIS CLIP PLACEMENT  06/25/2021   Procedure: HEMOSTASIS CLIP PLACEMENT;  Surgeon: Harvel Quale, MD;  [facility];  Service: Gastroenterology;;   HERNIA REPAIR Left 10/25/2006   PACEMAKER IMPLANT N/A 02/08/2021   Procedure: PACEMAKER IMPLANT;  Surgeon: Deboraha Sprang, MD;  Location: [facility];  Service: Cardiovascular;  Laterality: N/A;   POLYPECTOMY  03/12/2021   Procedure: POLYPECTOMY;  Surgeon: Harvel Quale, MD;  [facility];  Service: Gastroenterology;;   POLYPECTOMY  06/25/2021   Procedure:  POLYPECTOMY;  Surgeon: [name], [name], MD;  [facility];  Service: Gastroenterology;;   [name]  03/12/2021   Procedure: [name];  Surgeon: [name], [name], MD;  [facility]: [facility];  Service: Gastroenterology;;    SOCIAL HISTORY:  Social History   Socioeconomic History   Marital status:  Single    Spouse name: Not on file   Number of children: Not on file   Years of education: Not on file   Highest education level: Not on file  Occupational History   Not on file  Tobacco Use   Smoking status: Every Day    Packs/day: 0.75    Years: 50.00    Total pack years: 37.50    Types: Cigarettes    Passive exposure: Current   Smokeless tobacco: Never  Vaping Use   Vaping Use: Never used  Substance and Sexual Activity   Alcohol use: Yes    Alcohol/week: 2.0 - 3.0 standard drinks of alcohol    Types: 2 - 3 Shots of liquor per week    Comment: daily liquor moderatly   Drug use: Not on file    Comment: uses every other day   Sexual activity: Not Currently  Other Topics Concern   Not on file  Social History Narrative   Not on file   Social Determinants of Health   Financial Resource Strain: Not on file  Food Insecurity: Not on file  Transportation Needs: Not on file  Physical Activity: Not on file  Stress: Not on file  Social Connections: Not on file  Intimate Partner Violence: Not on file    FAMILY HISTORY:  Family History  Problem Relation Age of Onset   Heart disease Father    Colon polyps Sister        less than 10 lifetime polyps   Colon cancer Maternal Grandmother        dx 90s    CURRENT MEDICATIONS:  Current Outpatient Medications  Medication Sig Dispense Refill   albuterol (VENTOLIN HFA) 108 (90 Base) MCG/ACT inhaler Inhale 2 puffs into the lungs every 6 (six) hours as needed for wheezing or shortness of breath. 8 g 2   amLODipine (NORVASC) 2.5 MG tablet Take 1 tablet (2.5 mg total) by mouth daily. 30 tablet 1   aspirin EC 81 MG EC tablet Take 1 tablet (81 mg total) by mouth daily at 6 (six) AM. Swallow whole. 30 tablet 11   bisacodyl (DULCOLAX) 5 MG EC tablet Take 5 mg by mouth daily as needed for moderate constipation.     carvedilol (COREG) 6.25 MG tablet Take 1 tablet (6.25 mg total) by mouth 2 (two) times daily with a meal. 60 tablet 3    feeding supplement (ENSURE ENLIVE / ENSURE PLUS) LIQD Take 237 mLs by mouth 3 (three) times daily between meals. 237 mL 12   loperamide (IMODIUM A-D) 2 MG tablet Take 1 tablet (2 mg total) by mouth 4 (four) times daily as needed for diarrhea or loose stools.  0   Multiple Vitamins-Minerals (MULTIVITAMIN WITH MINERALS) tablet Take 1 tablet by mouth daily. Centrum Silver for Men 50+     nicotine (NICODERM CQ - DOSED IN MG/24 HOURS) 21 mg/24hr patch Apply 1 patch every day by transdermal route.     atorvastatin (LIPITOR) 40 MG tablet Take 1 tablet (40 mg total) by mouth daily. 90 tablet 3   No current facility-administered medications for this visit.    ALLERGIES:  Allergies  Allergen Reactions  Lisinopril Anaphylaxis   Naproxen Shortness Of Breath and Palpitations   Nsaids Shortness Of Breath and Palpitations    PHYSICAL EXAM:  Performance status (ECOG): 1 - Symptomatic but completely ambulatory  Vitals:   09/23/21 1124  BP: (!) 148/86  Pulse: 63  Resp: 18  Temp: 98.3 F (36.8 C)  SpO2: 100%   Wt Readings from Last 3 Encounters:  09/23/21 165 lb 4.8 oz (75 kg)  09/05/21 158 lb (71.7 kg)  08/24/21 172 lb 13.5 oz (78.4 kg)   Physical Exam Vitals reviewed.  Constitutional:      Appearance: Normal appearance.  Cardiovascular:     Rate and Rhythm: Normal rate and regular rhythm.     Pulses: Normal pulses.     Heart sounds: Normal heart sounds.  Pulmonary:     Effort: Pulmonary effort is normal.     Breath sounds: Normal breath sounds.  Neurological:     General: No focal deficit present.     Mental Status: He is alert and oriented to person, place, and time.  Psychiatric:        Mood and Affect: Mood normal.        Behavior: Behavior normal.      LABORATORY DATA:  I have reviewed the labs as listed.     Latest Ref Rng & Units 09/16/2021    1:36 PM 09/05/2021    5:49 AM 09/04/2021    5:00 AM  CBC  WBC 4.0 - 10.5 K/uL 6.5  6.3  6.8   Hemoglobin 13.0 - 17.0 g/dL  11.9  11.6  12.7   Hematocrit 39.0 - 52.0 % 37.0  33.7  37.5   Platelets 150 - 400 K/uL 251  270  336       Latest Ref Rng & Units 09/16/2021    1:36 PM 09/06/2021    5:03 AM 09/05/2021    5:49 AM  CMP  Glucose 70 - 99 mg/dL 101  91  86   BUN 8 - 23 mg/dL 42  79  102   Creatinine 0.61 - 1.24 mg/dL 2.12  2.76  3.70   Sodium 135 - 145 mmol/L 137  136  136   Potassium 3.5 - 5.1 mmol/L 5.1  3.8  3.8   Chloride 98 - 111 mmol/L 112  107  108   CO2 22 - 32 mmol/L 20  20  18    Calcium 8.9 - 10.3 mg/dL 9.3  8.9  9.2   Total Protein 6.5 - 8.1 g/dL 8.0  7.1  6.9   Total Bilirubin 0.3 - 1.2 mg/dL 0.4  0.5  0.4   Alkaline Phos 38 - 126 U/L 113  99  100   AST 15 - 41 U/L 23  31  20    ALT 0 - 44 U/L 36  34  21     DIAGNOSTIC IMAGING:  I have independently reviewed the scans and discussed with the patient. US Renal  Result Date: 09/04/2021 CLINICAL DATA:  Azotemia EXAM: RENAL / URINARY TRACT ULTRASOUND COMPLETE COMPARISON:  CT 09/04/2021 FINDINGS: Right Kidney: Renal measurements: 11.4 x 5.4 x 7.2 cm = volume: 231 mL. Increased renal cortical echogenicity. Adjacent cysts within the interpolar region of the right kidney measuring up to 1.7 cm and 1.6 cm. No solid mass, shadowing stone, or hydronephrosis visualized. Left Kidney: Renal measurements: 5.7 x 2.2 x 3.2 cm = volume: 21 mL. Atrophic kidney with increased cortical echogenicity. 1.2 cm lower pole cyst. No solid mass, shadowing stone  or hydronephrosis visualized. Bladder: Appears normal for degree of bladder distention. Other: None. IMPRESSION: 1. No evidence of obstructive uropathy. 2. Echogenic kidneys with left renal atrophy, likely sequela of medical renal disease. 3. Bilateral renal cysts.  No solid renal mass identified. Electronically Signed   By: Davina Poke D.O.   On: 09/04/2021 13:35   CT Renal Stone Study  Result Date: 09/04/2021 CLINICAL DATA:  71 year old male under evaluation for potential hydronephrosis. EXAM: CT ABDOMEN AND  PELVIS WITHOUT CONTRAST TECHNIQUE: Multidetector CT imaging of the abdomen and pelvis was performed following the standard protocol without IV contrast. RADIATION DOSE REDUCTION: This exam was performed according to the departmental dose-optimization program which includes automated exposure control, adjustment of the mA and/or kV according to patient size and/or use of iterative reconstruction technique. COMPARISON:  PET-CT 06/10/2021. FINDINGS: Lower chest: Emphysema. Atherosclerotic calcifications in the descending thoracic aorta as well as the left main, left anterior descending, left circumflex and right coronary arteries. Pacemaker leads in the right atrium and right ventricle. Hepatobiliary: No definite suspicious cystic or solid hepatic lesions are confidently identified on today's noncontrast CT examination. There is a subcentimeter low-attenuation lesion in the posterior aspect of the right lobe of the liver, incompletely characterized on today's non-contrast CT examination, but statistically likely a small cyst. Unenhanced appearance of the gallbladder is unremarkable. Pancreas: No definite pancreatic mass or peripancreatic fluid collections or inflammatory changes are noted on today's noncontrast CT examination. Spleen: Unremarkable. Adrenals/Urinary Tract: Severe left renal atrophy. Multiple lesions in the right kidney, incompletely characterized on today's noncontrast CT examination, but ranging from low to intermediate to high attenuation. The largest of these lesions is an exophytic intermediate attenuation lesion in the lateral aspect of the interpolar region of the right kidney measuring 1.7 cm in diameter (axial image 34 of series 2). These lesions are grossly similar to prior CT 04/08/2021, likely a combination of cysts and mildly proteinaceous/hemorrhagic cysts. No hydroureteronephrosis. Urinary bladder is normal in appearance. Stomach/Bowel: The unenhanced appearance of the stomach is normal.  No pathologic dilatation of small bowel or colon. Postoperative changes of partial colectomy are noted. 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 No lymphadenopathy noted in the abdomen or pelvis. Reproductive: Prostate gland and seminal vesicles are unremarkable in appearance. Other: No significant volume of ascites.  No pneumoperitoneum. Musculoskeletal: There are no aggressive appearing lytic or blastic lesions noted in the visualized portions of the skeleton. IMPRESSION: 1. No hydroureteronephrosis. 2. Multiple lesions in the right kidney varying degrees of complexity, likely to represent proteinaceous/hemorrhagic cysts and simple cysts, but incompletely characterized on today's noncontrast CT examination. These could be further characterized with follow-up nonemergent abdominal MRI with and without IV gadolinium if of clinical concern. 3. Severe atrophy of the left kidney, unchanged. 4. Emphysema. 5. Aortic atherosclerosis, in addition to left main and three-vessel coronary artery disease. 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 6. Additional incidental findings, as above. Electronically Signed   By: Vinnie Langton M.D.   On: 09/04/2021 07:20     ASSESSMENT:  Stage IIa (T3N0) adenocarcinoma of the splenic flexure: - He reported some rectal bleeding. - Colonoscopy on 03/12/2021: Fungating partially obstructing large mass found at  40 cm proximal to the anus, circumferential with no bleeding present. - CEA on 03/12/2021: 8.3 - CT angio abdomen and pelvis on 04/08/2021: No evidence of metastatic lymphadenopathy or liver mets.  Stable 7 mm subpleural pulmonary nodule  in the periphery of the right lower lobe. - CT angio CAP on 02/04/2021: No evidence of lung metastasis, abdominal metastasis. - Resection of splenic flexure mass on 04/23/2021 by Dr. Marcello Moores. - Pathology: Moderately differentiated adenocarcinoma, invades into subserosa, margins negative, 0/38 lymph nodes.  No LVI or perineural invasion.  PT3 pN0.  MSI-stable. - Guardant reveal (05/21/2021): CT DNA was detected. - PET scan on 06/10/2021 shows 2 cm hypermetabolic soft tissue density in the transverse colon.  No evidence of local or distant metastatic disease.    Social/family history: - He is a retired [employer]. - Current active smoker, half pack per day for 50 years. - Mother had colon cancer at age 48.  Maternal grandmother had colon cancer.  3.  Stage III (X4IA1) right colon adenocarcinoma: - Right colectomy on 08/10/2021 by Dr. Marcello Moores. - Pathology with grade 2 moderately differentiated colonic adenocarcinoma, tumor site ascending colon, margins negative, 1/22 lymph nodes involved, [id number].  MMR preserved.   PLAN:  Stage III (T4AN1) right colon adenocarcinoma, MMR preserved: -He is status post right hemicolectomy and has recovered very well. - We discussed the pathology report in detail.  CEA has improved to 5.7 on 09/16/2021. - Because of T4 tumor and lymph node involvement, I have recommended 6 months of adjuvant chemotherapy with FOLFOX regimen. - We discussed the side effects and schedule of the regimen in detail. - Recommend port placement. - RTC prior to cycle 1.  2.  Normocytic anemia: -Combination anemia from CKD and relative iron deficiency.  Hemoglobin is 11.9.  Creatinine improved to 2.12. - Will check ferritin and iron panel at next visit.   Orders placed this encounter:  No orders of the defined types were placed in this encounter.    Derek Jack, MD [place] [phone]   I, Thana Ates, am acting as a scribe for Dr. Derek Jack.  I, Derek Jack MD, have reviewed the above documentation for accuracy and completeness, and I agree with the above.

## 2021-09-24 ENCOUNTER — Other Ambulatory Visit: Payer: Self-pay

## 2021-09-24 NOTE — Progress Notes (Signed)
Pharmacist Chemotherapy Monitoring - Initial Assessment    Anticipated start date: 10/05/21   The following has been reviewed per standard work regarding the patient's treatment regimen: The patient's diagnosis, treatment plan and drug doses, and organ/hematologic function Lab orders and baseline tests specific to treatment regimen  The treatment plan start date, drug sequencing, and pre-medications Prior authorization status  Patient's documented medication list, including drug-drug interaction screen and prescriptions for anti-emetics and supportive care specific to the treatment regimen The drug concentrations, fluid compatibility, administration routes, and timing of the medications to be used The patient's access for treatment and lifetime cumulative dose history, if applicable  The patient's medication allergies and previous infusion related reactions, if applicable   Changes made to treatment plan:  treatment plan date  Follow up needed:  N/A   Wynona Neat, [facility], 09/24/2021  3:18 PM

## 2021-09-28 ENCOUNTER — Other Ambulatory Visit: Payer: Self-pay | Admitting: Radiology

## 2021-09-29 ENCOUNTER — Other Ambulatory Visit: Payer: Self-pay

## 2021-09-29 ENCOUNTER — Encounter: Payer: Self-pay | Admitting: Hematology

## 2021-09-29 ENCOUNTER — Ambulatory Visit (HOSPITAL_COMMUNITY)
Admission: RE | Admit: 2021-09-29 | Discharge: 2021-09-29 | Disposition: A | Discharge status: Home / Self Care | Payer: Medicare Other | Source: Ambulatory Visit | Attending: Hematology | Admitting: Hematology

## 2021-09-29 ENCOUNTER — Encounter (HOSPITAL_COMMUNITY): Payer: Self-pay

## 2021-09-29 DIAGNOSIS — C189 Malignant neoplasm of colon, unspecified: Secondary | ICD-10-CM | POA: Insufficient documentation

## 2021-09-29 DIAGNOSIS — I351 Nonrheumatic aortic (valve) insufficiency: Secondary | ICD-10-CM | POA: Insufficient documentation

## 2021-09-29 DIAGNOSIS — N183 Chronic kidney disease, stage 3 unspecified: Secondary | ICD-10-CM | POA: Insufficient documentation

## 2021-09-29 DIAGNOSIS — I129 Hypertensive chronic kidney disease with stage 1 through stage 4 chronic kidney disease, or unspecified chronic kidney disease: Secondary | ICD-10-CM | POA: Insufficient documentation

## 2021-09-29 DIAGNOSIS — C186 Malignant neoplasm of descending colon: Secondary | ICD-10-CM

## 2021-09-29 DIAGNOSIS — Z950 Presence of cardiac pacemaker: Secondary | ICD-10-CM | POA: Diagnosis not present

## 2021-09-29 DIAGNOSIS — Z452 Encounter for adjustment and management of vascular access device: Secondary | ICD-10-CM | POA: Diagnosis not present

## 2021-09-29 DIAGNOSIS — F17210 Nicotine dependence, cigarettes, uncomplicated: Secondary | ICD-10-CM | POA: Insufficient documentation

## 2021-09-29 DIAGNOSIS — Z95828 Presence of other vascular implants and grafts: Secondary | ICD-10-CM | POA: Insufficient documentation

## 2021-09-29 HISTORY — PX: IR IMAGING GUIDED PORT INSERTION: IMG5740

## 2021-09-29 HISTORY — DX: Presence of other vascular implants and grafts: Z95.828

## 2021-09-29 MED ORDER — FENTANYL CITRATE (PF) 100 MCG/2ML IJ SOLN
INTRAMUSCULAR | Status: AC
  Filled 2021-09-29: qty 2

## 2021-09-29 MED ORDER — FENTANYL CITRATE (PF) 100 MCG/2ML IJ SOLN
INTRAMUSCULAR | Status: AC | PRN
  Administered 2021-09-29: 25 ug via INTRAVENOUS

## 2021-09-29 MED ORDER — HEPARIN SOD (PORK) LOCK FLUSH 100 UNIT/ML IV SOLN
INTRAVENOUS | Status: AC
  Filled 2021-09-29: qty 5

## 2021-09-29 MED ORDER — PROCHLORPERAZINE MALEATE 10 MG PO TABS: 10.0000 mg | ORAL_TABLET | Freq: Four times a day (QID) | ORAL | 6 refills | Status: DC | PRN

## 2021-09-29 MED ORDER — SODIUM CHLORIDE 0.9 % IV SOLN: INTRAVENOUS | Status: DC

## 2021-09-29 MED ORDER — HEPARIN SOD (PORK) LOCK FLUSH 100 UNIT/ML IV SOLN
INTRAVENOUS | Status: AC | PRN
  Administered 2021-09-29: 500 [IU] via INTRAVENOUS

## 2021-09-29 MED ORDER — MIDAZOLAM HCL 2 MG/2ML IJ SOLN
INTRAMUSCULAR | Status: AC
  Filled 2021-09-29: qty 2

## 2021-09-29 MED ORDER — LIDOCAINE-PRILOCAINE 2.5-2.5 % EX CREA: TOPICAL_CREAM | CUTANEOUS | 3 refills | Status: DC

## 2021-09-29 MED ORDER — LIDOCAINE-EPINEPHRINE 1 %-1:100000 IJ SOLN
INTRAMUSCULAR | Status: AC
  Filled 2021-09-29: qty 1

## 2021-09-29 MED ORDER — MIDAZOLAM HCL 2 MG/2ML IJ SOLN
INTRAMUSCULAR | Status: AC | PRN
  Administered 2021-09-29: 1 mg via INTRAVENOUS

## 2021-09-29 NOTE — Patient Instructions (Addendum)
[facility] Chem[facility] Teaching   You are diagnosed with Stage III colon cancer. You will be treated in the clinic every 2 weeks with a combination of chemotherapy drugs. Those drugs are oxaliplatin and fluorouracil (5FU). You will also receive a drug called leucovorin. This is not chemotherapy but a vitamin that helps the 5FU work better and remain in your system longer (you should not experience any side effects from this drug). We will plan to give this treatment every 2 weeks for a total of 6 months (12 total treatments). The intent of treatment is cure/ensure that this cancer does not come back. You will see the doctor regularly throughout treatment.  We will obtain blood work from you prior to every treatment and monitor your results to make sure it is safe to give your treatment. The doctor monitors your response to treatment by the way you are feeling, your blood work, and by obtaining scans periodically.  There will be wait times while you are here for treatment.  It will take about 30 minutes to 1 hour for your lab work to result.  Then there will be wait times while pharmacy mixes your medications.   Medications you will receive in the clinic prior to your chemotherapy medications:  Aloxi:  ALOXI is used in adults to help prevent nausea and vomiting that happens with certain chemotherapy drugs.  Aloxi is a long acting medication, and will remain in your system for about two days.   Dexamethasone:  This is a steroid given prior to chemotherapy to help prevent allergic reactions; it may also help prevent and control nausea and diarrhea that chemotherapy can cause.     Oxaliplatin (Eloxatin)  About This Drug  Oxaliplatin is used to treat cancer. It is given in the vein (IV).  It takes two hours to infuse.  Possible Side Effects   Bone marrow suppression. This is a decrease in the number of white blood cells, red blood cells, and platelets. This may raise your risk of  infection, make you tired and weak (fatigue), and raise your risk of bleeding.   Tiredness   Soreness of the mouth and throat. You may have red areas, white patches, or sores that hurt.   Nausea and vomiting (throwing up)   Diarrhea (loose bowel movements)   Changes in your liver function   Effects on the nerves called peripheral neuropathy. You may feel numbness, tingling, or pain in your hands and feet, and may be worse in cold temperatures. It may be hard for you to button your clothes, open jars, or walk as usual. The effect on the nerves may get worse with more doses of the drug. These effects get better in some people after the drug is stopped but it does not get better in all people  Note: Each of the side effects above was reported in 40% or greater of patients treated with oxaliplatin. Not all possible side effects are included above.   Warnings and Precautions   Allergic reactions, including anaphylaxis, which may be life-threatening are rare but may happen in some patients. Signs of allergic reaction to this drug may be swelling of the face, feeling like your tongue or throat are swelling, trouble breathing, rash, itching, fever, chills, feeling dizzy, and/or feeling that your heart is beating in a fast or not normal way. If this happens, do not take another dose of this drug. You should get urgent medical treatment.   Inflammation (swelling) of the lungs, which  may be life-threatening. You may have a dry cough or trouble breathing.   Effects on the nerves (neuropathy) may resolve within 14 days, or it may persist beyond 14 days.   Severe decrease in white blood cells when combined with the chemotherapy agents 5-fluorouracil and leucovorin. This may be life-threatening.   Severe changes in your liver function   Abnormal heart beat and/or EKG, which can be life-threatening   Rhabdomyolysis- damage to your muscles which may release proteins in your blood and affect how your  kidneys work, which can be life-threatening. You may have severe muscle weakness and/or pain, or dark urine.  Important Information   This drug may impair your ability to drive or use machinery. Talk to your doctor and/or nurse about precautions you may need to take.   This drug may be present in the saliva, tears, sweat, urine, stool, vomit, semen, and vaginal secretions. Talk to your doctor and/or your nurse about the necessary precautions to take during this time.  * The effects on the nerves can be aggravated by exposure to cold. Avoid cold beverages, use of ice and make sure you cover your skin and dress warmly prior to being exposed to cold temperatures while you are receiving treatment with oxaliplatin*   Treating Side Effects   Manage tiredness by pacing your activities for the day.   Be sure to include periods of rest between energy-draining activities.   To decrease the risk of infection, wash your hands regularly.   Avoid close contact with people who have a cold, the flu, or other infections.  Take your temperature as your doctor or nurse tells you, and whenever you feel like you may have a fever.   To help decrease the risk of bleeding, use a soft toothbrush. Check with your nurse before using dental floss.   Be very careful when using knives or tools.   Use an electric shaver instead of a razor.   Drink plenty of fluids (a minimum of eight glasses per day is recommended).   Mouth care is very important. Your mouth care should consist of routine, gentle cleaning of your teeth or dentures and rinsing your mouth with a mixture of 1/2 teaspoon of salt in 8 ounces of water or 1/2 teaspoon of baking soda in 8 ounces of water. This should be done at least after each meal and at bedtime.   If you have mouth sores, avoid mouthwash that has alcohol. Also avoid alcohol and smoking because they can bother your mouth and throat.   To help with nausea and vomiting, eat small,  frequent meals instead of three large meals a day. Choose foods and drinks that are at room temperature. Ask your nurse or doctor about other helpful tips and medicine that is available to help stop or lessen these symptoms.   If you throw up or have loose bowel movements, you should drink more fluids so that you do not become dehydrated (lack of water in the body from losing too much fluid).   If you have diarrhea, eat low-fiber foods that are high in protein and calories and avoid foods that can irritate your digestive tracts or lead to cramping.   Ask your nurse or doctor about medicine that can lessen or stop your diarrhea.   If you have numbness and tingling in your hands and feet, be careful when cooking, walking, and handling sharp objects and hot liquids.   Do not drink cold drinks or use ice in beverages.  Drink fluids at room temperature or warmer, and drink through a straw.   Wear gloves to touch cold objects, and wear warm clothing and cover you skin during cold weather.   Food and Drug Interactions   There are no known interactions of oxaliplatin with food and other medications.   This drug may interact with other medicines. Tell your doctor and pharmacist about all the prescription and over-the-counter medicines and dietary supplements (vitamins, minerals, herbs and others) that you are taking at this time. Also, check with your doctor or pharmacist before starting any new prescription or over-the-counter medicines, or dietary supplements to make sure that there are no interactions   When to Call the Doctor  Call your doctor or nurse if you have any of these symptoms and/or any new or unusual symptoms:   Fever of 100.4 F (38 C) or higher   Chills   Tiredness that interferes with your daily activities   Feeling dizzy or lightheaded   Easy bleeding or bruising   Feeling that your heart is beating in a fast or not normal way (palpitations)   Pain in your chest    Dry cough   Trouble breathing   Pain in your mouth or throat that makes it hard to eat or drink   Nausea that stops you from eating or drinking and/or is not relieved by prescribed medicines   Throwing up   Diarrhea, 4 times in one day or diarrhea with lack of strength or a feeling of being dizzy   Numbness, tingling, or pain in your hands and feet   Signs of possible liver problems: dark urine, pale bowel movements, bad stomach pain, feeling very tired and weak, unusual itching, or yellowing of the eyes or skin   Signs of rhabdomyolysis: decreased urine, very dark urine, muscle pain in the shoulders, thighs, or lower back; muscle weakness or trouble moving arms and legs   Signs of allergic reaction: swelling of the face, feeling like your tongue or throat are swelling, trouble breathing, rash, itching, fever, chills, feeling dizzy, and/or feeling that your heart is beating in a fast or not normal way. If this happens, call 911 for emergency care.   If you think you may be pregnant  Reproduction Warnings   Pregnancy warning: This drug may have harmful effects on the unborn baby. Women of childbearing potential should use effective methods of birth control during your cancer treatment. Let your doctor know right away if you think you may be pregnant or may have impregnated your partner.   Breastfeeding warning: It is not known if this drug passes into breast milk. For this reason, women should talk to their doctor about the risks and benefits of breastfeeding during treatment with this drug because this drug may enter the breast milk and cause harm to a breastfeeding baby.   Fertility warning: Human fertility studies have not been done with this drug. Talk with your doctor or nurse if you plan to have children. Ask for information on sperm or egg banking.   Leucovorin Calcium  About This Drug  Leucovorin is a vitamin. It is used in combination with other cancer fighting drugs such  as 5-fluorouracil and methotrexate. Leucovorin is given in the vein (IV).  This drug runs at the same time as the oxaliplatin and takes 2 hours to infuse.   Possible Side Effects  Rash and itching  Note: Leucovorin by itself has very few side effects. Other side effects you may have can  be caused by the other drugs you are taking, such as 5-fluorouracil.   Warnings and Precautions   Allergic reactions, including anaphylaxis are rare but may happen in some patients. Signs of allergic reaction to this drug may be swelling of the face, feeling like your tongue or throat are swelling, trouble breathing, rash, itching, fever, chills, feeling dizzy, and/or feeling that your heart is beating in a fast or not normal way. If this happens, do not take another dose of this drug. You should get urgent medical treatment.  Food and Drug Interactions   There are no known interactions of leucovorin with food.   This drug may interact with other medicines. Tell your doctor and pharmacist about all the prescription and over-the-counter medicines and dietary supplements (vitamins, minerals, herbs and others) that you are taking at this time.   Also, check with your doctor or pharmacist before starting any new prescription or over-the-counter medicines, or dietary supplements to make sure that there are no interactions.   When to Call the Doctor  Call your doctor or nurse if you have any of these symptoms and/or any new or unusual symptoms:   A new rash or a rash that is not relieved by prescribed medicines   Signs of allergic reaction: swelling of the face, feeling like your tongue or throat are swelling, trouble breathing, rash, itching, fever, chills, feeling dizzy, and/or feeling that your heart is beating in a fast or not normal way. If this happens, call 911 for emergency care.   If you think you may be pregnant   Reproduction Warnings   Pregnancy warning: It is not known if this drug may harm an  unborn child. For this reason, be sure to talk with your doctor if you are pregnant or planning to become pregnant while receiving this drug. Let your doctor know right away if you think you may be pregnant   Breastfeeding warning: It is not known if this drug passes into breast milk. For this reason, women should talk to their doctor about the risks and benefits of breastfeeding during treatment with this drug because this drug may enter the breast milk and cause harm to a breastfeeding baby.   Fertility warning: Human fertility studies have not been done with this drug. Talk with your doctor or nurse if you plan to have children. Ask for information on sperm or egg banking.   5-Fluorouracil (Adrucil; 5FU)  About This Drug  Fluorouracil is used to treat cancer. It is given in the vein (IV). It is given as an IV push from a syringe and also as a continuous infusion given via an ambulatory pump (a pump you take home and wear for a specified amount of time).  Possible Side Effects   Bone marrow suppression. This is a decrease in the number of white blood cells, red blood cells, and platelets. This may raise your risk of infection, make you tired and weak (fatigue), and raise your risk of bleeding   Changes in the tissue of the heart and/or heart attack. Some changes may happen that can cause your heart to have less ability to pump blood.   Blurred vision or other changes in eyesight   Nausea and throwing up (vomiting)   Diarrhea (loose bowel movements)   Ulcers - sores that may cause pain or bleeding in your digestive tract, which includes your mouth, esophagus, stomach, small/large intestines and rectum   Soreness of the mouth and throat. You may have red areas,  white patches, or sores that hurt.   Allergic reactions, including anaphylaxis are rare but may happen in some patients. Signs of allergic reaction to this drug may be swelling of the face, feeling like your tongue or throat are  swelling, trouble breathing, rash, itching, fever, chills, feeling dizzy, and/or feeling that your heart is beating in a fast or not normal way. If this happens, do not take another dose of this drug. You should get urgent medical treatment.   Sensitivity to light (photosensitivity). Photosensitivity means that you may become more sensitive to the sun and/or light. You may get a skin rash/reaction if you are in the sun or are exposed to sun lamps and tanning beds. Your eyes may water more, mostly in bright light.   Changes in your nail color, nail loss and/or brittle nail   Darkening of the skin, or changes to the color of your skin and/or veins used for infusion   Rash, dry skin, or itching  Note: Not all possible side effects are included above.  Warnings and Precautions   Hand-and-foot syndrome. The palms of your hands or soles of your feet may tingle, become numb, painful, swollen, or red.   Changes in your central nervous system can happen. The central nervous system is made up of your brain and spinal cord. You could feel extreme tiredness, agitation, confusion, hallucinations (see or hear things that are not there), trouble understanding or speaking, loss of control of your bowels or bladder, eyesight changes, numbness or lack of strength to your arms, legs, face, or body, or coma. If you start to have any of these symptoms let your doctor know right away.   Side effects of this drug may be unexpectedly severe in some patients  Note: Some of the side effects above are very rare. If you have concerns and/or questions, please discuss them with your medical team.   Important Information   This drug may be present in the saliva, tears, sweat, urine, stool, vomit, semen, and vaginal secretions. Talk to your doctor and/or your nurse about the necessary precautions to take during this time.   Treating Side Effects   Manage tiredness by pacing your activities for the day.   Be sure to  include periods of rest between energy-draining activities.   To help decrease the risk of infections, wash your hands regularly.   Avoid close contact with people who have a cold, the flu, or other infections.   Take your temperature as your doctor or nurse tells you, and whenever you feel like you may have a fever.   Use a soft toothbrush. Check with your nurse before using dental floss.   Be very careful when using knives or tools.   Use an electric shaver instead of a razor.   If you have a nose bleed, sit with your head tipped slightly forward. Apply pressure by lightly pinching the bridge of your nose between your thumb and forefinger. Call your doctor if you feel dizzy or faint or if the bleeding doesn't stop after 10 to 15 minutes.   Drink plenty of fluids (a minimum of eight glasses per day is recommended).   If you throw up or have loose bowel movements, you should drink more fluids so that you do not  become dehydrated (lack of water in the body from losing too much fluid).   To help with nausea and vomiting, eat small, frequent meals instead of three large meals a day. Choose foods and drinks  that are at room temperature. Ask your nurse or doctor about other helpful tips and medicine that is available to help, stop, or lessen these symptoms.   If you have diarrhea, eat low-fiber foods that are high in protein and calories and avoid foods that can irritate your digestive tracts or lead to cramping.   Ask your nurse or doctor about medicine that can lessen or stop your diarrhea.   Mouth care is very important. Your mouth care should consist of routine, gentle cleaning of your teeth or dentures and rinsing your mouth with a mixture of 1/2 teaspoon of salt in 8 ounces of water or 1/2 teaspoon of baking soda in 8 ounces of water. This should be done at least after each meal and at bedtime.   If you have mouth sores, avoid mouthwash that has alcohol. Also avoid alcohol and smoking  because they can bother your mouth and throat.   Keeping your nails moisturized may help with brittleness.   To help with itching, moisturize your skin several times day.   Use sunscreen with SPF 30 or higher when you are outdoors even for a short time. Cover up when you are out in the sun. Wear wide-brimmed hats, long-sleeved shirts, and pants. Keep your neck, chest, and back covered. Wear dark sun glasses when in the sun or bright lights.   If you get a rash do not put anything on it unless your doctor or nurse says you may. Keep the area around the rash clean and dry. Ask your doctor for medicine if your rash bothers you.   Keeping your pain under control is important to your well-being. Please tell your doctor or nurse if you are experiencing pain.   Food and Drug Interactions   There are no known interactions of fluorouracil with food.   Check with your doctor or pharmacist about all other prescription medicines and over-the-counter medicines and dietary supplements (vitamins, minerals, herbs and others) you are taking before starting this medicine as there are known drug interactions with 5-fluoroucacil. Also, check with your doctor or pharmacist before starting any new prescription or over-the-counter medicines, or dietary supplements to make sure that there are no interactions.  When to Call the Doctor  Call your doctor or nurse if you have any of these symptoms and/or any new or unusual symptoms:   Fever of 100.4 F (38 C) or higher   Chills   Easy bleeding or bruising   Nose bleed that doesn't stop bleeding after 10-15 minutes   Trouble breathing   Feeling dizzy or lightheaded   Feeling that your heart is beating in a fast or not normal way (palpitations)   Chest pain or symptoms of a heart attack. Most heart attacks involve pain in the center of the chest that lasts more than a few minutes. The pain may go away and come back or it can be constant. It can feel like  pressure, squeezing, fullness, or pain. Sometimes pain is felt in one or both arms, the back, neck, jaw, or stomach. If any of these symptoms last 2 minutes, call 911.   Confusion and/or agitation   Hallucinations   Trouble understanding or speaking   Loss of control of bowels or bladder   Blurry vision or changes in your eyesight   Headache that does not go away   Numbness or lack of strength to your arms, legs, face, or body   Nausea that stops you from eating or drinking and/or is  not relieved by prescribed medicines   Throwing up more than 3 times a day   Diarrhea, 4 times in one day or diarrhea with lack of strength or a feeling of being dizzy   Pain in your mouth or throat that makes it hard to eat or drink   Pain along the digestive tract - especially if worse after eating   Blood in your vomit (bright red or coffee-ground) and/or stools (bright red, or black/tarry)   Coughing up blood   Tiredness that interferes with your daily activities   Painful, red, or swollen areas on your hands or feet or around your nails   A new rash or a rash that is not relieved by prescribed medicines   Develop sensitivity to sunlight/light   Numbness and/or tingling of your hands and/or feet   Signs of allergic reaction: swelling of the face, feeling like your tongue or throat are swelling, trouble breathing, rash, itching, fever, chills, feeling dizzy, and/or feeling that your heart is beating in a fast or not normal way. If this happens, call 911 for emergency care.   If you think you are pregnant or may have impregnated your partner  Reproduction Warnings   Pregnancy warning: This drug may have harmful effects on the unborn baby. Women of child bearing potential should use effective methods of birth control during your cancer treatment and 3 months after treatment. Men with male partners of childbearing potential should use effective methods of birth control during your cancer  treatment and for 3 months after your cancer treatment. Let your doctor know right away if you think you may be pregnant or may have impregnated your partner.   Breastfeeding warning: It is not known if this drug passes into breast milk. For this reason, Women should not breastfeed during treatment because this drug could enter the breast milk and cause harm to a breastfeeding baby.   Fertility warning: In men and women both, this drug may affect your ability to have children in the future. Talk with your doctor or nurse if you plan to have children. Ask for information on sperm or egg banking.  SELF CARE ACTIVITIES WHILE ON CHEMOTHERAPY/IMMUNOTHERAPY:  Hydration Increase your fluid intake and drink at least 8 to 12 cups (64 ounces) of water/decaffeinated beverages per day after treatment. You can still have your cup of coffee or soda but these beverages do not count as part of your 8 to 12 cups that you need to drink daily. No alcohol intake.  Medications Continue taking your normal prescription medication as prescribed.  If you start any new herbal or new supplements please let us know first to make sure it is safe.  Mouth Care Have teeth cleaned professionally before starting treatment. Keep dentures and partial plates clean. Use soft toothbrush and do not use mouthwashes that contain alcohol. Biotene is a good mouthwash that is available at most pharmacies or may be ordered by calling [phone]. Use warm salt water gargles (1 teaspoon salt per 1 quart warm water) before and after meals and at bedtime. Or you may rinse with 2 tablespoons of three-percent hydrogen peroxide mixed in eight ounces of water. If you are still having problems with your mouth or sores in your mouth please call the clinic. If you need dental work, please let the doctor know before you go for your appointment so that we can coordinate the best possible time for you in regards to your chemo regimen. You need to also let  your dentist know that you are actively taking chemo. We may need to do labs prior to your dental appointment.  Skin Care Always use sunscreen that has not expired and with SPF (Sun Protection Factor) of 50 or higher. Wear hats to protect your head from the sun. Remember to use sunscreen on your hands, ears, face, & feet.  Use good moisturizing lotions such as udder cream, eucerin, or even Vaseline. Some chemotherapies can cause dry skin, color changes in your skin and nails.    Avoid long, hot showers or baths. Use gentle, fragrance-free soaps and laundry detergent. Use moisturizers, preferably creams or ointments rather than lotions because the thicker consistency is better at preventing skin dehydration. Apply the cream or ointment within 15 minutes of showering. Reapply moisturizer at night, and moisturize your hands every time after you wash them.   Infection Prevention Please wash your hands for at least 30 seconds using warm soapy water. Handwashing is the #1 way to prevent the spread of germs. Stay away from sick people or people who are getting over a cold. If you develop respiratory systems such as green/yellow mucus production or productive cough or persistent cough let us know and we will see if you need an antibiotic. It is a good idea to keep a pair of gloves on when going into grocery stores/[employer] to decrease your risk of coming into contact with germs on the carts, etc. Carry alcohol hand gel with you at all times and use it frequently if out in public. If your temperature reaches 100.5 or higher please call the clinic and let us know.  If it is after hours or on the weekend please go to the ER if your temperature is over 100.4.  Please have your own personal thermometer at home to use.    Sex and bodily fluids If you are going to have sex, a condom must be used to protect the person that isn't taking immunotherapy. For a few days after treatment, immunotherapy can be excreted through  your bodily fluids.  When using the toilet please close the lid and flush the toilet twice.  Do this for a few day after you have had immunotherapy.   Contraception It is not known for sure whether or not immunotherapy drugs can be passed on through semen or secretions from the vagina. Because of this some doctors advise people to use a barrier method if you have sex during treatment. This applies to vaginal, anal or oral sex.  Generally, doctors advise a barrier method only for the time you are actually having the treatment and for about a week after your treatment.  Advice like this can be worrying, but this does not mean that you have to avoid being intimate with your partner. You can still have close contact with your partner and continue to enjoy sex.  Animals If you have cats or birds we just ask that you not change the litter or change the cage.  Please have someone else do this for you while you are on immunotherapy.   Food Safety During and After Cancer Treatment Food safety is important for people both during and after cancer treatment. Cancer and cancer treatments, such as chemotherapy, radiation therapy, and stem cell/bone marrow transplantation, often weaken the immune system. This makes it harder for your body to protect itself from foodborne illness, also called food poisoning. Foodborne illness is caused by eating food that contains harmful bacteria, parasites, or viruses.  Foods to avoid Some  foods have a higher risk of becoming tainted with bacteria. These include: Unwashed fresh fruit and vegetables, especially leafy vegetables that can hide dirt and other contaminants Raw sprouts, such as alfalfa sprouts Raw or undercooked beef, especially ground beef, or other raw or undercooked meat and poultry Fatty, fried, or spicy foods immediately before or after treatment.  These can sit heavy on your stomach and make you feel nauseous. Raw or undercooked shellfish, such as  oysters. Sushi and sashimi, which often contain raw fish.  Unpasteurized beverages, such as unpasteurized fruit juices, raw milk, raw yogurt, or cider Undercooked eggs, such as soft boiled, over easy, and poached; raw, unpasteurized eggs; or foods made with raw egg, such as homemade raw cookie dough and homemade mayonnaise  Simple steps for food safety  Shop smart. Do not buy food stored or displayed in an unclean area. Do not buy bruised or damaged fruits or vegetables. Do not buy cans that have cracks, dents, or bulges. Pick up foods that can spoil at the end of your shopping trip and store them in a cooler on the way home.  Prepare and clean up foods carefully. Rinse all fresh fruits and vegetables under running water, and dry them with a clean towel or paper towel. Clean the top of cans before opening them. After preparing food, wash your hands for 20 seconds with hot water and soap. Pay special attention to areas between fingers and under nails. Clean your utensils and dishes with hot water and soap. Disinfect your kitchen and cutting boards using 1 teaspoon of liquid, unscented bleach mixed into 1 quart of water.    Dispose of old food. Eat canned and packaged food before its expiration date (the "use by" or "best before" date). Consume refrigerated leftovers within 3 to 4 days. After that time, throw out the food. Even if the food does not smell or look spoiled, it still may be unsafe. Some bacteria, such as Listeria, can grow even on foods stored in the refrigerator if they are kept for too long.  Take precautions when eating out. At restaurants, avoid buffets and salad bars where food sits out for a long time and comes in contact with many people. Food can become contaminated when someone with a virus, often a norovirus, or another "bug" handles it. Put any leftover food in a "to-go" container yourself, rather than having the server do it. And, refrigerate leftovers as soon as you  get home. Choose restaurants that are clean and that are willing to prepare your food as you order it cooked.    SYMPTOMS TO REPORT AS SOON AS POSSIBLE AFTER TREATMENT:  FEVER GREATER THAN 100.4 F CHILLS WITH OR WITHOUT FEVER NAUSEA AND VOMITING THAT IS NOT CONTROLLED WITH YOUR NAUSEA MEDICATION UNUSUAL SHORTNESS OF BREATH UNUSUAL BRUISING OR BLEEDING TENDERNESS IN MOUTH AND THROAT WITH OR WITHOUT PRESENCE OF ULCERS URINARY PROBLEMS BOWEL PROBLEMS UNUSUAL RASH     Wear comfortable clothing and clothing appropriate for easy access to any Portacath or PICC line. Let us know if there is anything that we can do to make your therapy better!   What to do if you need assistance after hours or on the weekends: CALL [phone].  HOLD on the line, do not hang up.  You will hear multiple messages but at the end you will be connected with a nurse triage line.  They will contact the doctor if necessary.  Most of the time they will be able to assist you.  Do not call the hospital operator.    I have been informed and understand all of the instructions given to me and have received a copy. I have been instructed to call the clinic [phone] or my family physician as soon as possible for continued medical care, if indicated. I do not have any more questions at this time but understand that I may call the [facility] or the Patient Navigator at [phone] during office hours should I have questions or need assistance in obtaining follow-up care.

## 2021-09-29 NOTE — H&P (Addendum)
Chief Complaint: Patient was seen in consultation today for colon cancer  at the request of [facility]  Referring Physician(s): Katragadda,Sreedhar  Supervising Physician: Daryll Brod  Patient Status: [facility] - Out-pt  History of Present Illness: Fernando Dean is a 71 y.o. male w/ PMH of aortic regurgitation, CKD stage 3, colonic mass, HTN, pacemaker placement and TB. Pt was diagnosed with colon cancer 05/11/21. He is followed by Dr. Delton Coombes who has referred pt to IR for tunneled catheter with port placement.   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 - [employer] MRI 2272 dual chamber PPM   Tuberculosis    as a child, was treated    Past Surgical History:  Procedure Laterality Date   ABDOMINAL AORTIC ENDOVASCULAR STENT GRAFT Bilateral 02/26/2021   Procedure: ABDOMINAL AORTIC ENDOVASCULAR STENT GRAFT REPAIR;  Surgeon: Marty Heck, MD;  Location: [facility] OR;  Service: Vascular;  Laterality: Bilateral;   BIOPSY  03/12/2021   Procedure: BIOPSY;  Surgeon: Harvel Quale, MD;  [facility];  Service: Gastroenterology;;   BIOPSY  06/25/2021   Procedure: BIOPSY;  Surgeon: Harvel Quale, MD;  [facility];  Service: Gastroenterology;;  mass    COLONOSCOPY WITH PROPOFOL N/A 03/12/2021   Procedure: COLONOSCOPY WITH PROPOFOL;  Surgeon: Harvel Quale, MD;  [facility];  Service: Gastroenterology;  Laterality: N/A;  10:55   COLONOSCOPY WITH PROPOFOL N/A 06/25/2021   Procedure: COLONOSCOPY WITH PROPOFOL;  Surgeon: Harvel Quale, MD;  [facility];  Service: Gastroenterology;  Laterality: N/A;  235 ASA 2   HEMOSTASIS  CLIP PLACEMENT  03/12/2021   Procedure: HEMOSTASIS CLIP PLACEMENT;  Surgeon: Harvel Quale, MD;  [facility];  Service: Gastroenterology;;   HEMOSTASIS CLIP PLACEMENT  06/25/2021   Procedure: HEMOSTASIS CLIP PLACEMENT;  Surgeon: Harvel Quale, MD;  [facility];  Service: Gastroenterology;;   HERNIA REPAIR Left 10/25/2006   PACEMAKER IMPLANT N/A 02/08/2021   Procedure: PACEMAKER IMPLANT;  Surgeon: Deboraha Sprang, MD;  Location: [facility];  Service: Cardiovascular;  Laterality: N/A;   POLYPECTOMY  03/12/2021   Procedure: POLYPECTOMY;  Surgeon: Harvel Quale, MD;  [facility];  Service: Gastroenterology;;   POLYPECTOMY  06/25/2021   Procedure: POLYPECTOMY;  Surgeon: Harvel Quale, MD;  [facility];  Service: Gastroenterology;;   [name]  03/12/2021   Procedure: [name];  Surgeon: [name], [name], MD;  [facility];  Service: Gastroenterology;;    Allergies: Lisinopril, Naproxen, and Nsaids  Medications: Prior to Admission medications   Medication Sig Start Date End Date Taking? Authorizing Provider  amLODipine (NORVASC) 2.5 MG tablet Take 1 tablet (2.5 mg total) by mouth daily. 09/06/21  Yes Tat, Shanon Brow, MD  aspirin EC 81 MG EC tablet Take 1 tablet (81 mg total) by mouth daily at 6 (six) AM. Swallow whole. 02/28/21  Yes Dagoberto Ligas, PA-C  bisacodyl (DULCOLAX) 5 MG EC tablet Take 5 mg by mouth daily as needed for moderate constipation.   Yes [provider]  carvedilol (COREG) 6.25 MG tablet Take 1 tablet (6.25 mg total) by  mouth 2 (two) times daily with a meal. 06/29/21  Yes Carmin Muskrat, MD  famotidine (PEPCID) 20 MG tablet Take 20 mg by mouth daily as needed for heartburn or indigestion.   Yes [provider]  loperamide (IMODIUM A-D) 2 MG tablet Take 1 tablet (2 mg total) by mouth 4 (four) times daily as needed for diarrhea or loose stools.  09/21/25  Yes Leighton Ruff, MD  Multiple Vitamins-Minerals (MULTIVITAMIN WITH MINERALS) tablet Take 1 tablet by mouth daily. Centrum Silver for Men 50+   Yes [provider]  nicotine (NICODERM CQ - DOSED IN MG/24 HOURS) 21 mg/24hr patch Place 21 mg onto the skin daily. 09/09/21  Yes [provider]  simvastatin (ZOCOR) 20 MG tablet Take 20 mg by mouth daily.   Yes [provider]  albuterol (VENTOLIN HFA) 108 (90 Base) MCG/ACT inhaler Inhale 2 puffs into the lungs every 6 (six) hours as needed for wheezing or shortness of breath. 05/18/21   Lindell Spar, MD  feeding supplement (ENSURE ENLIVE / ENSURE PLUS) LIQD Take 237 mLs by mouth 3 (three) times daily between meals. Patient taking differently: Take 237 mLs by mouth 3 (three) times daily as needed (nutrition). 09/05/21   Orson Eva, MD     Family History  Problem Relation Age of Onset   Heart disease Father    Colon polyps Sister        less than 10 lifetime polyps   Colon cancer Maternal Grandmother        dx 90s    Social History   Socioeconomic History   Marital status: Single    Spouse name: Not on file   Number of children: Not on file   Years of education: Not on file   Highest education level: Not on file  Occupational History   Not on file  Tobacco Use   Smoking status: Every Day    Packs/day: 0.75    Years: 50.00    Total pack years: 37.50    Types: Cigarettes    Passive exposure: Current   Smokeless tobacco: Never  Vaping Use   Vaping Use: Never used  Substance and Sexual Activity   Alcohol use: Yes    Alcohol/week: 2.0 - 3.0 standard drinks of alcohol    Types: 2 - 3 Shots of liquor per week    Comment: daily liquor moderatly   Drug use: Not on file    Comment: uses every other day   Sexual activity: Not Currently  Other Topics Concern   Not on file  Social History Narrative   Not on file   Social Determinants of Health   Financial Resource Strain: Not on file  Food  Insecurity: Not on file  Transportation Needs: Not on file  Physical Activity: Not on file  Stress: Not on file  Social Connections: Not on file     Review of Systems: A 12 point ROS discussed and pertinent positives are indicated in the HPI above.  All other systems are negative.  Review of Systems  All other systems reviewed and are negative.   Vital Signs: BP (!) 143/78 (BP Location: Right Arm)   Pulse 61   Temp 98.2 F (36.8 C) (Oral)   Ht 6' (1.829 m)   Wt 163 lb (73.9 kg)   SpO2 100%   BMI 22.11 kg/m     Physical Exam Vitals reviewed.  Constitutional:      General: He is not in acute distress.  Appearance: Normal appearance. He is not ill-appearing.  HENT:     Mouth/Throat:     Mouth: Mucous membranes are dry.     Pharynx: Oropharynx is clear.  Eyes:     Extraocular Movements: Extraocular movements intact.     Pupils: Pupils are equal, round, and reactive to light.  Cardiovascular:     Rate and Rhythm: Normal rate and regular rhythm.     Pulses: Normal pulses.     Heart sounds: Normal heart sounds.     Comments: Left side pacemaker in place Pulmonary:     Effort: Pulmonary effort is normal. No respiratory distress.     Breath sounds: Normal breath sounds.  Abdominal:     General: Bowel sounds are normal. There is no distension.     Palpations: Abdomen is soft.     Tenderness: There is no abdominal tenderness. There is no guarding.  Musculoskeletal:     Right lower leg: No edema.     Left lower leg: No edema.  Skin:    General: Skin is warm and dry.  Neurological:     Mental Status: He is alert and oriented to person, place, and time.  Psychiatric:        Mood and Affect: Mood normal.        Behavior: Behavior normal.        Thought Content: Thought content normal.        Judgment: Judgment normal.     Imaging: US Renal  Result Date: 09/04/2021 CLINICAL DATA:  Azotemia EXAM: RENAL / URINARY TRACT ULTRASOUND COMPLETE COMPARISON:  CT  09/04/2021 FINDINGS: Right Kidney: Renal measurements: 11.4 x 5.4 x 7.2 cm = volume: 231 mL. Increased renal cortical echogenicity. Adjacent cysts within the interpolar region of the right kidney measuring up to 1.7 cm and 1.6 cm. No solid mass, shadowing stone, or hydronephrosis visualized. Left Kidney: Renal measurements: 5.7 x 2.2 x 3.2 cm = volume: 21 mL. Atrophic kidney with increased cortical echogenicity. 1.2 cm lower pole cyst. No solid mass, shadowing stone or hydronephrosis visualized. Bladder: Appears normal for degree of bladder distention. Other: None. IMPRESSION: 1. No evidence of obstructive uropathy. 2. Echogenic kidneys with left renal atrophy, likely sequela of medical renal disease. 3. Bilateral renal cysts.  No solid renal mass identified. Electronically Signed   By: Davina Poke D.O.   On: 09/04/2021 13:35   CT Renal Stone Study  Result Date: 09/04/2021 CLINICAL DATA:  71 year old male under evaluation for potential hydronephrosis. EXAM: CT ABDOMEN AND PELVIS WITHOUT CONTRAST TECHNIQUE: Multidetector CT imaging of the abdomen and pelvis was performed following the standard protocol without IV contrast. RADIATION DOSE REDUCTION: This exam was performed according to the departmental dose-optimization program which includes automated exposure control, adjustment of the mA and/or kV according to patient size and/or use of iterative reconstruction technique. COMPARISON:  PET-CT 06/10/2021. FINDINGS: Lower chest: Emphysema. Atherosclerotic calcifications in the descending thoracic aorta as well as the left main, left anterior descending, left circumflex and right coronary arteries. Pacemaker leads in the right atrium and right ventricle. Hepatobiliary: No definite suspicious cystic or solid hepatic lesions are confidently identified on today's noncontrast CT examination. There is a subcentimeter low-attenuation lesion in the posterior aspect of the right lobe of the liver, incompletely  characterized on today's non-contrast CT examination, but statistically likely a small cyst. Unenhanced appearance of the gallbladder is unremarkable. Pancreas: No definite pancreatic mass or peripancreatic fluid collections or inflammatory changes are noted on today's noncontrast CT examination.  Spleen: Unremarkable. Adrenals/Urinary Tract: Severe left renal atrophy. Multiple lesions in the right kidney, incompletely characterized on today's noncontrast CT examination, but ranging from low to intermediate to high attenuation. The largest of these lesions is an exophytic intermediate attenuation lesion in the lateral aspect of the interpolar region of the right kidney measuring 1.7 cm in diameter (axial image 34 of series 2). These lesions are grossly similar to prior CT 04/08/2021, likely a combination of cysts and mildly proteinaceous/hemorrhagic cysts. No hydroureteronephrosis. Urinary bladder is normal in appearance. Stomach/Bowel: The unenhanced appearance of the stomach is normal. No pathologic dilatation of small bowel or colon. Postoperative changes of partial colectomy are noted. 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 No lymphadenopathy noted in the abdomen or pelvis. Reproductive: Prostate gland and seminal vesicles are unremarkable in appearance. Other: No significant volume of ascites.  No pneumoperitoneum. Musculoskeletal: There are no aggressive appearing lytic or blastic lesions noted in the visualized portions of the skeleton. IMPRESSION: 1. No hydroureteronephrosis. 2. Multiple lesions in the right kidney varying degrees of complexity, likely to represent proteinaceous/hemorrhagic cysts and simple cysts, but incompletely characterized on today's noncontrast CT examination. These could be further  characterized with follow-up nonemergent abdominal MRI with and without IV gadolinium if of clinical concern. 3. Severe atrophy of the left kidney, unchanged. 4. Emphysema. 5. Aortic atherosclerosis, in addition to left main and three-vessel coronary artery disease. 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 6. Additional incidental findings, as above. Electronically Signed   By: Vinnie Langton M.D.   On: 09/04/2021 07:20    Labs:  CBC: Recent Labs    09/03/21 1934 09/04/21 0500 09/05/21 0549 09/16/21 1336  WBC 7.6 6.8 6.3 6.5  HGB 14.3 12.7* 11.6* 11.9*  HCT 42.1 37.5* 33.7* 37.0*  PLT 415* 336 270 251    COAGS: Recent Labs    02/24/21 1507 02/26/21 1001 09/04/21 0500  INR 0.9 1.0  --   APTT 32 38* 32    BMP: Recent Labs    09/04/21 0500 09/05/21 0549 09/06/21 0503 09/16/21 1336  NA 131* 136 136 137  K 4.2 3.8 3.8 5.1  CL 100 108 107 112*  CO2 16* 18* 20* 20*  GLUCOSE 83 86 91 101*  BUN 125* 102* 79* 42*  CALCIUM 9.8 9.2 8.9 9.3  CREATININE 6.02* 3.70* 2.76* 2.12*  GFRNONAA 9* 17* 24* 33*    LIVER FUNCTION TESTS: Recent Labs    09/04/21 0500 09/05/21 0549 09/06/21 0503 09/16/21 1336  BILITOT 0.6 0.4 0.5 0.4  AST 12* '20 31 23  '$ ALT 16 21 34 36  ALKPHOS 116 100 99 113  PROT 7.8 6.9 7.1 8.0  ALBUMIN 3.6 3.2* 3.2* 3.6    TUMOR MARKERS: No results for input(s): "AFPTM", "CEA", "CA199", "CHROMGRNA" in the last 8760 hours.  Assessment and Plan: History of aortic regurgitation, CKD stage 3, colonic mass, HTN, pacemaker placement and TB. Pt was diagnosed with colon cancer 05/11/21. He is followed by Dr. Delton Coombes who has referred pt to IR for tunneled catheter with port placement.   Pt resting on stretcher.  He is A&O, calm and pleasant.  Pt is NPO per order.   Risks and  benefits of image guided tunneled  catheter with port placement was discussed with the patient including, but not limited to bleeding, infection, pneumothorax, or fibrin sheath development and need for additional procedures.  All of the patient's questions were answered, patient is agreeable to proceed. Consent signed and in chart.   Thank you for this interesting consult.  I greatly enjoyed meeting Fernando Dean and look forward to participating in their care.  A copy of this report was sent to the requesting provider on this date.  Electronically Signed: Tyson Alias, NP 09/29/2021, 9:44 AM   I spent a total of 20 minutes in face to face in clinical consultation, greater than 50% of which was counseling/coordinating care for colon cancer.

## 2021-09-29 NOTE — Procedures (Signed)
Interventional Radiology Procedure Note  Procedure: RT IJ POWER PORT    Complications: None  Estimated Blood Loss:  MIN  Findings: TIP SVCRA    M. TREVOR Ruble Buttler, MD    

## 2021-09-30 ENCOUNTER — Inpatient Hospital Stay: Payer: Medicare Other

## 2021-09-30 DIAGNOSIS — Z95828 Presence of other vascular implants and grafts: Secondary | ICD-10-CM

## 2021-09-30 DIAGNOSIS — C183 Malignant neoplasm of hepatic flexure: Secondary | ICD-10-CM

## 2021-10-01 ENCOUNTER — Ambulatory Visit: Payer: Medicare Other | Admitting: Physician Assistant

## 2021-10-01 ENCOUNTER — Encounter: Payer: Self-pay | Admitting: Student

## 2021-10-01 ENCOUNTER — Ambulatory Visit: Payer: Medicare Other | Admitting: Student

## 2021-10-01 VITALS — BP 124/68 | HR 69 | Ht 72.0 in | Wt 167.6 lb

## 2021-10-01 DIAGNOSIS — I442 Atrioventricular block, complete: Secondary | ICD-10-CM

## 2021-10-01 DIAGNOSIS — Z8679 Personal history of other diseases of the circulatory system: Secondary | ICD-10-CM

## 2021-10-01 DIAGNOSIS — E785 Hyperlipidemia, unspecified: Secondary | ICD-10-CM | POA: Diagnosis not present

## 2021-10-01 DIAGNOSIS — C183 Malignant neoplasm of hepatic flexure: Secondary | ICD-10-CM

## 2021-10-01 DIAGNOSIS — Z9889 Other specified postprocedural states: Secondary | ICD-10-CM | POA: Diagnosis not present

## 2021-10-01 DIAGNOSIS — I10 Essential (primary) hypertension: Secondary | ICD-10-CM

## 2021-10-01 NOTE — Progress Notes (Signed)
Cardiology Office Note    Date:  10/01/2021   ID:  Fernando Dean, DOB 07/29/1950, MRN [id number]  PCP:  [name], FNP  Cardiologist: Rozann Lesches, MD   EP: Dr. Lovena Le  Chief Complaint  Patient presents with   Follow-up    6 month visit    History of Present Illness:    Fernando Dean is a 71 y.o. male with past medical history of CHB (s/p St.Jude PPM in 01/2021), AAA (s/p EVAR in 02/2021), colon cancer (s/p partial colectomy in 07/2021), HTN, aortic atherosclerosis and tobacco use who presents to the office today for routine follow-up.  He was examined by Dr. Lovena Le in 05/2021 and denied any recent chest pain, palpitations or syncope. His device was functioning normally and no changes were made to his cardiac regimen at that time  In the interim, he did undergo partial colectomy in 07/2021 and did develop an ileus but this slowly resolved during his admission. He was again admitted to [facility] in 08/2021 for an AKI with creatinine peaking at 8.08 but this had improved to 2.76 at the time of hospital discharge (further improved to 2.12 on 09/16/2021). He is being followed by Hematology and is starting adjunct chemotherapy next week and will have repeat labs at that time.   In talking with the patient and his sister today, he reports overall feeling well since his last office visit. His activity was more limited following his colon surgery this summer but he has resumed walking for at least a mile each day at a local track. He denies any recent chest pain, palpitations or dyspnea on exertion with this. No recent orthopnea, PND or pitting edema. No recurrent fatigue as he felt prior to PPM placement. He did lose weight following his surgery but says this is gradually improving.    Past Medical History:  Diagnosis Date   Alcohol use    Aortic regurgitation    moderate AR 02/05/21 echo   Chronic kidney disease    stage 3   Colonic mass    Family history of colon cancer  06/08/2021   Hypertension    Port-A-Cath in place 09/29/2021   Presence of permanent cardiac pacemaker 02/08/2021   Inserted 02/08/21 for CHB - St Jude/[employer] MRI 2272 dual chamber PPM   Tuberculosis    as a child, was treated    Past Surgical History:  Procedure Laterality Date   ABDOMINAL AORTIC ENDOVASCULAR STENT GRAFT Bilateral 02/26/2021   Procedure: ABDOMINAL AORTIC ENDOVASCULAR STENT GRAFT REPAIR;  Surgeon: Marty Heck, MD;  Location: [facility] OR;  Service: Vascular;  Laterality: Bilateral;   BIOPSY  03/12/2021   Procedure: BIOPSY;  Surgeon: Harvel Quale, MD;  [facility];  Service: Gastroenterology;;   BIOPSY  06/25/2021   Procedure: BIOPSY;  Surgeon: Harvel Quale, MD;  [facility];  Service: Gastroenterology;;  mass    COLONOSCOPY WITH PROPOFOL N/A 03/12/2021   Procedure: COLONOSCOPY WITH PROPOFOL;  Surgeon: Harvel Quale, MD;  [facility];  Service: Gastroenterology;  Laterality: N/A;  10:55   COLONOSCOPY WITH PROPOFOL N/A 06/25/2021   Procedure: COLONOSCOPY WITH PROPOFOL;  Surgeon: Harvel Quale, MD;  [facility];  Service: Gastroenterology;  Laterality: N/A;  235 ASA 2   HEMOSTASIS CLIP PLACEMENT  03/12/2021   Procedure: HEMOSTASIS CLIP PLACEMENT;  Surgeon: Harvel Quale, MD;  [facility];  Service: Gastroenterology;;   HEMOSTASIS CLIP PLACEMENT  06/25/2021  Procedure: HEMOSTASIS CLIP PLACEMENT;  Surgeon: Harvel Quale, MD;  [facility];  Service: Gastroenterology;;   HERNIA REPAIR Left 10/25/2006   IR IMAGING GUIDED PORT INSERTION  09/29/2021   PACEMAKER IMPLANT N/A 02/08/2021   Procedure: PACEMAKER IMPLANT;  Surgeon: Deboraha Sprang, MD;  Location: [place] [facility];  Service: Cardiovascular;  Laterality: N/A;   POLYPECTOMY  03/12/2021   Procedure: POLYPECTOMY;  Surgeon: Harvel Quale, MD;  [facility];   Service: Gastroenterology;;   POLYPECTOMY  06/25/2021   Procedure: POLYPECTOMY;  Surgeon: Harvel Quale, MD;  [facility];  Service: Gastroenterology;;   [name]  03/12/2021   Procedure: [name];  Surgeon: [name], [name], MD;  [facility];  Service: Gastroenterology;;    Current Medications: Outpatient Medications Prior to Visit  Medication Sig Dispense Refill   albuterol (VENTOLIN HFA) 108 (90 Base) MCG/ACT inhaler Inhale 2 puffs into the lungs every 6 (six) hours as needed for wheezing or shortness of breath. 8 g 2   amLODipine (NORVASC) 2.5 MG tablet Take 1 tablet (2.5 mg total) by mouth daily. 30 tablet 1   aspirin EC 81 MG EC tablet Take 1 tablet (81 mg total) by mouth daily at 6 (six) AM. Swallow whole. 30 tablet 11   bisacodyl (DULCOLAX) 5 MG EC tablet Take 5 mg by mouth daily as needed for moderate constipation.     carvedilol (COREG) 6.25 MG tablet Take 1 tablet (6.25 mg total) by mouth 2 (two) times daily with a meal. 60 tablet 3   famotidine (PEPCID) 20 MG tablet Take 20 mg by mouth daily as needed for heartburn or indigestion.     feeding supplement (ENSURE ENLIVE / ENSURE PLUS) LIQD Take 237 mLs by mouth 3 (three) times daily between meals. (Patient taking differently: Take 237 mLs by mouth 3 (three) times daily as needed (nutrition).) 237 mL 12   [START ON 10/05/2021] fluorouracil CALGB 08676 2,400 mg/m2 in sodium chloride 0.9 % 150 mL Inject 2,400 mg/m2 into the vein over 48 hr. Every 14 days     [START ON 10/05/2021] FLUOROURACIL IV Inject into the vein every 14 (fourteen) days.     LEUCOVORIN CALCIUM IV Inject into the vein every 14 (fourteen) days.     lidocaine-prilocaine (EMLA) cream Apply a small amount to port a cath site (do not rub in) and cover with plastic wrap 1 hour prior to infusion appointment 30 g 3   loperamide (IMODIUM A-D) 2 MG tablet Take 1 tablet (2 mg total) by mouth 4 (four) times daily as needed for  diarrhea or loose stools.  0   Multiple Vitamins-Minerals (MULTIVITAMIN WITH MINERALS) tablet Take 1 tablet by mouth daily. Centrum Silver for Men 50+     nicotine (NICODERM CQ - DOSED IN MG/24 HOURS) 21 mg/24hr patch Place 21 mg onto the skin daily.     [START ON 10/05/2021] OXALIPLATIN IV Inject into the vein every 14 (fourteen) days.     prochlorperazine (COMPAZINE) 10 MG tablet Take 1 tablet (10 mg total) by mouth every 6 (six) hours as needed (Nausea or vomiting). 60 tablet 6   simvastatin (ZOCOR) 20 MG tablet Take 20 mg by mouth daily.     No facility-administered medications prior to visit.     Allergies:   Lisinopril, Naproxen, and Nsaids   Social History   Socioeconomic History   Marital status: Single    Spouse name: Not on file   Number of  children: Not on file   Years of education: Not on file   Highest education level: Not on file  Occupational History   Not on file  Tobacco Use   Smoking status: Every Day    Packs/day: 0.75    Years: 50.00    Total pack years: 37.50    Types: Cigarettes    Passive exposure: Current   Smokeless tobacco: Never  Vaping Use   Vaping Use: Never used  Substance and Sexual Activity   Alcohol use: Yes    Alcohol/week: 2.0 - 3.0 standard drinks of alcohol    Types: 2 - 3 Shots of liquor per week    Comment: daily liquor moderatly   Drug use: Not on file    Comment: uses every other day   Sexual activity: Not Currently  Other Topics Concern   Not on file  Social History Narrative   Not on file   Social Determinants of Health   Financial Resource Strain: Not on file  Food Insecurity: Not on file  Transportation Needs: Not on file  Physical Activity: Not on file  Stress: Not on file  Social Connections: Not on file     Family History:  The patient's family history includes Colon cancer in his maternal grandmother; Colon polyps in his sister; Heart disease in his father.   Review of Systems:    Please see the history of  present illness.     All other systems reviewed and are otherwise negative except as noted above.   Physical Exam:    VS:  BP 124/68   Pulse 69   Ht 6' (1.829 m)   Wt 167 lb 9.6 oz (76 kg)   SpO2 99%   BMI 22.73 kg/m    General: Well developed, well nourished,male appearing in no acute distress. Head: Normocephalic, atraumatic. Neck: No carotid bruits. JVD not elevated.  Lungs: Respirations regular and unlabored, without wheezes or rales.  Heart: Regular rate and rhythm. No S3 or S4.  No murmur, no rubs, or gallops appreciated. Abdomen: Appears non-distended. No obvious abdominal masses. Msk:  Strength and tone appear normal for age. No obvious joint deformities or effusions. Extremities: No clubbing or cyanosis. No pitting edema.  Distal pedal pulses are 2+ bilaterally. Neuro: Alert and oriented X 3. Moves all extremities spontaneously. No focal deficits noted. Psych:  Responds to questions appropriately with a normal affect. Skin: No rashes or lesions noted  Wt Readings from Last 3 Encounters:  10/01/21 167 lb 9.6 oz (76 kg)  09/29/21 163 lb (73.9 kg)  09/23/21 165 lb 4.8 oz (75 kg)     Studies/Labs Reviewed:   EKG:  EKG is not ordered today.   Recent Labs: 02/04/2021: TSH 1.006 09/06/2021: Magnesium 2.2 09/16/2021: ALT 36; BUN 42; Creatinine, Ser 2.12; Hemoglobin 11.9; Platelets 251; Potassium 5.1; Sodium 137   Lipid Panel    Component Value Date/Time   CHOL 131 05/23/2021 0000   TRIG 74 05/23/2021 0000   HDL 58 05/23/2021 0000   CHOLHDL 2.3 05/23/2021 0000   LDLCALC 58 05/23/2021 0000    Additional studies/ records that were reviewed today include:   Echocardiogram: 01/2021 IMPRESSIONS     1. Left ventricular ejection fraction, by estimation, is 60 to 65%. The  left ventricle has normal function. The left ventricle has no regional  wall motion abnormalities. There is mild left ventricular hypertrophy.  Left ventricular diastolic parameters  are  indeterminate.   2. Right ventricular systolic function is normal.  The right ventricular  size is not well visualized. There is mildly elevated pulmonary artery  systolic pressure.   3. Left atrial size was severely dilated.   4. Right atrial size was moderately dilated.   5. The mitral valve is grossly normal. Trivial mitral valve  regurgitation.   6. AI jet against the anterior mitral valve leaflet. Jet eccentric P1/2T  may underestimate. The aortic valve is calcified. Aortic valve  regurgitation is moderate.   7. The inferior vena cava is normal in size with greater than 50%  respiratory variability, suggesting right atrial pressure of 3 mmHg.   Assessment:    1. Complete heart block ([place])   2. S/P AAA repair   3. Essential hypertension   4. Hyperlipidemia, unspecified hyperlipidemia type   5. 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 History of CHB - He is s/p St.Jude PPM in 01/2021. He did have a remote device check in 07/2021 which showed normal device function. Followed by Dr. Lovena Le.  2. AAA - He is s/p EVAR in 02/2021 and is followed by Vascular Surgery.   3. HTN - His blood pressure is well controlled at 124/68 during today's visit. Continue current medical therapy with Amlodipine 2.5 mg daily and Coreg 6.25 mg twice daily. I did encourage him to keep a close check on BP as he starts chemotherapy for if his weight continues to decline, Amlodipine may need to be discontinued.  4. HLD - Followed by his PCP. FLP in 05/2021 showed total cholesterol 131, triglycerides 74, HDL 58 and LDL 58.  He remains on Simvastatin 20 mg daily. Would not further titrate given the concurrent use of Amlodipine.  5. Colon Cancer - He underwent partial colectomy in 07/2021 and underwent port placement earlier this week with plans to start adjunct chemotherapy on 10/05/2021.   Medication Adjustments/Labs and  Tests Ordered: Current medicines are reviewed at length with the patient today.  Concerns regarding medicines are outlined above.  Medication changes, Labs and Tests ordered today are listed in the Patient Instructions below. Patient Instructions  Medication Instructions:  Continue all current medications.  Labwork: none  Testing/Procedures: none  Follow-Up: Dr. Lovena Le next April or May   Any Other Special Instructions Will Be Listed Below (If Applicable).   If you need a refill on your cardiac medications before your next appointment, please call your pharmacy.    Signed, Erma Heritage, PA-C  10/01/2021 5:09 PM    [facility] [address] [place], [place] [address] Phone: [phone] Fax: [phone]

## 2021-10-01 NOTE — Patient Instructions (Signed)
Medication Instructions:  Continue all current medications.  Labwork: none  Testing/Procedures: none  Follow-Up: Dr. Lovena Le next April or May   Any Other Special Instructions Will Be Listed Below (If Applicable).   If you need a refill on your cardiac medications before your next appointment, please call your pharmacy.

## 2021-10-04 ENCOUNTER — Encounter: Payer: Self-pay | Admitting: Hematology

## 2021-10-04 ENCOUNTER — Encounter (HOSPITAL_COMMUNITY): Payer: Self-pay | Admitting: Hematology

## 2021-10-04 NOTE — Progress Notes (Signed)

## 2021-10-05 ENCOUNTER — Inpatient Hospital Stay: Payer: Medicare Other

## 2021-10-05 ENCOUNTER — Inpatient Hospital Stay (HOSPITAL_BASED_OUTPATIENT_CLINIC_OR_DEPARTMENT_OTHER): Payer: Medicare Other | Admitting: Hematology

## 2021-10-05 VITALS — BP 142/73 | HR 62 | Temp 96.5°F | Resp 18 | Wt 170.0 lb

## 2021-10-05 DIAGNOSIS — C183 Malignant neoplasm of hepatic flexure: Secondary | ICD-10-CM

## 2021-10-05 DIAGNOSIS — F17210 Nicotine dependence, cigarettes, uncomplicated: Secondary | ICD-10-CM | POA: Diagnosis not present

## 2021-10-05 DIAGNOSIS — C186 Malignant neoplasm of descending colon: Secondary | ICD-10-CM

## 2021-10-05 DIAGNOSIS — Z95828 Presence of other vascular implants and grafts: Secondary | ICD-10-CM

## 2021-10-05 DIAGNOSIS — Z9049 Acquired absence of other specified parts of digestive tract: Secondary | ICD-10-CM | POA: Diagnosis not present

## 2021-10-05 DIAGNOSIS — Z800 Family history of malignant neoplasm of digestive organs: Secondary | ICD-10-CM | POA: Diagnosis not present

## 2021-10-05 DIAGNOSIS — Z5111 Encounter for antineoplastic chemotherapy: Secondary | ICD-10-CM | POA: Diagnosis not present

## 2021-10-05 DIAGNOSIS — D649 Anemia, unspecified: Secondary | ICD-10-CM | POA: Diagnosis not present

## 2021-10-05 LAB — COMPREHENSIVE METABOLIC PANEL
ALT: 17 U/L (ref 0–44)
AST: 21 U/L (ref 15–41)
Albumin: 3.4 g/dL — ABNORMAL LOW (ref 3.5–5.0)
Alkaline Phosphatase: 101 U/L (ref 38–126)
Anion gap: 7 (ref 5–15)
BUN: 22 mg/dL (ref 8–23)
CO2: 19 mmol/L — ABNORMAL LOW (ref 22–32)
Calcium: 9.1 mg/dL (ref 8.9–10.3)
Chloride: 113 mmol/L — ABNORMAL HIGH (ref 98–111)
Creatinine, Ser: 1.78 mg/dL — ABNORMAL HIGH (ref 0.61–1.24)
GFR, Estimated: 41 mL/min — ABNORMAL LOW (ref >60)
Glucose, Bld: 119 mg/dL — ABNORMAL HIGH (ref 70–99)
Potassium: 4.6 mmol/L (ref 3.5–5.1)
Sodium: 139 mmol/L (ref 135–145)
Total Bilirubin: 0.8 mg/dL (ref 0.3–1.2)
Total Protein: 7.4 g/dL (ref 6.5–8.1)

## 2021-10-05 LAB — CBC WITH DIFFERENTIAL/PLATELET
Abs Immature Granulocytes: 0.02 10*3/uL (ref 0.00–0.07)
Basophils Absolute: 0.1 10*3/uL (ref 0.0–0.1)
Basophils Relative: 1 %
Eosinophils Absolute: 0.4 10*3/uL (ref 0.0–0.5)
Eosinophils Relative: 6 %
HCT: 34.4 % — ABNORMAL LOW (ref 39.0–52.0)
Hemoglobin: 11.3 g/dL — ABNORMAL LOW (ref 13.0–17.0)
Immature Granulocytes: 0 %
Lymphocytes Relative: 33 %
Lymphs Abs: 2 10*3/uL (ref 0.7–4.0)
MCH: 31.8 pg (ref 26.0–34.0)
MCHC: 32.8 g/dL (ref 30.0–36.0)
MCV: 96.9 fL (ref 80.0–100.0)
Monocytes Absolute: 0.5 10*3/uL (ref 0.1–1.0)
Monocytes Relative: 9 %
Neutro Abs: 3.1 10*3/uL (ref 1.7–7.7)
Neutrophils Relative %: 51 %
Platelets: 164 10*3/uL (ref 150–400)
RBC: 3.55 MIL/uL — ABNORMAL LOW (ref 4.22–5.81)
RDW: 14.8 % (ref 11.5–15.5)
WBC: 6.1 10*3/uL (ref 4.0–10.5)
nRBC: 0 % (ref 0.0–0.2)

## 2021-10-05 LAB — MAGNESIUM: Magnesium: 1.6 mg/dL — ABNORMAL LOW (ref 1.7–2.4)

## 2021-10-05 MED ORDER — MAGNESIUM SULFATE 2 GM/50ML IV SOLN
2.0000 g | Freq: Once | INTRAVENOUS | Status: AC
  Administered 2021-10-05: 2 g via INTRAVENOUS

## 2021-10-05 MED ORDER — SODIUM CHLORIDE 0.9 % IV SOLN
10.0000 mg | Freq: Once | INTRAVENOUS | Status: AC
  Administered 2021-10-05: 10 mg via INTRAVENOUS
  Filled 2021-10-05: qty 10

## 2021-10-05 MED ORDER — LEUCOVORIN CALCIUM INJECTION 350 MG
320.0000 mg/m2 | Freq: Once | INTRAVENOUS | Status: AC
  Administered 2021-10-05: 624 mg via INTRAVENOUS
  Filled 2021-10-05: qty 31.2

## 2021-10-05 MED ORDER — MAGNESIUM SULFATE 2 GM/50ML IV SOLN
INTRAVENOUS | Status: AC
  Filled 2021-10-05: qty 50

## 2021-10-05 MED ORDER — PALONOSETRON HCL INJECTION 0.25 MG/5ML
INTRAVENOUS | Status: AC
  Filled 2021-10-05: qty 5

## 2021-10-05 MED ORDER — FLUOROURACIL CHEMO INJECTION 2.5 GM/50ML
320.0000 mg/m2 | Freq: Once | INTRAVENOUS | Status: AC
  Administered 2021-10-05: 600 mg via INTRAVENOUS
  Filled 2021-10-05: qty 12

## 2021-10-05 MED ORDER — DEXTROSE 5 % IV SOLN: Freq: Once | INTRAVENOUS | Status: AC

## 2021-10-05 MED ORDER — SODIUM CHLORIDE 0.9 % IV SOLN: INTRAVENOUS | Status: DC

## 2021-10-05 MED ORDER — OXALIPLATIN CHEMO INJECTION 100 MG/20ML
68.0000 mg/m2 | Freq: Once | INTRAVENOUS | Status: AC
  Administered 2021-10-05: 135 mg via INTRAVENOUS
  Filled 2021-10-05: qty 20

## 2021-10-05 MED ORDER — PALONOSETRON HCL INJECTION 0.25 MG/5ML
0.2500 mg | Freq: Once | INTRAVENOUS | Status: AC
  Administered 2021-10-05: 0.25 mg via INTRAVENOUS

## 2021-10-05 MED ORDER — SODIUM CHLORIDE 0.9 % IV SOLN
1920.0000 mg/m2 | INTRAVENOUS | Status: DC
  Administered 2021-10-05: 3750 mg via INTRAVENOUS
  Filled 2021-10-05: qty 75

## 2021-10-05 NOTE — Progress Notes (Signed)
Pt presents today for C1D1 Folfox per provider's order. Vital signs and other labs WNL for treatment today. Pt's magnesium is 1.6. Pt will receive 2g IV magnesium sulfate. Pt's creatinine was 1.78 today per Dr.K all chemotherapy will  dose reduced by 20% due to elevated creatinine. Okay to proceed with treatment per Dr.K.  C1D1 Folfox given today per MD orders. Tolerated infusion without adverse affects. Vital signs stable. No complaints at this time. Discharged from clinic ambulatory in stable condition. Alert and oriented x 3. F/U with [facility] as scheduled. 5FU ambulatory pump infusing.

## 2021-10-05 NOTE — Patient Instructions (Signed)
[place] at [facility] Discharge Instructions   You were seen and examined today by Dr. Delton Coombes.  He discussed your treatment plan with you. He discussed side effects of one of the drugs called oxaliplatin. It can cause cold sensitivity. You should avoid cold drinks and food and touching cold things for 4-5 days after treatment. The 64f drug you will be receiving can cause diarrhea. Have Imodium handy and take as directed.   We will proceed with your treatment today.  Return as scheduled in 2 weeks.     [facility].  To afford each patient quality time with our provider, please arrive at least 15 minutes before your scheduled appointment time.   If you have a lab appointment with the [facility] come in thru the Main Entrance and check in at the main information desk.  You need to re-schedule your appointment should you arrive 10 or more minutes late.  We strive to give you quality time with our providers, and arriving late affects you and other patients whose appointments are after yours.  Also, if you no show three or more times for appointments you may be dismissed from the clinic at the providers discretion.     Again, [facility]  Our hope is that these requests will decrease the amount of time that you wait before being seen by our physicians.       _____________________________________________________________  Should you have questions [facility] please contact our office at [phone] follow the prompts.  Our office hours are 8:00 a.m. and 4:30 p.m. Monday - Friday.  Please note that voicemails left after 4:00 p.m. may not be returned until the following business day.  We are closed weekends and major holidays.  You do have access to a nurse 24-7, just call the main  number to the clinic [phone] do not press any options, hold on the line and a nurse will answer the phone.    For prescription refill requests, have your pharmacy contact our office and allow 72 hours.    Due to Covid, you will need to wear a mask upon entering the hospital. If you do not have a mask, a mask will be given to you at the Main Entrance upon arrival. For doctor visits, patients may have 1 support person age 3791or older with them. For treatment visits, patients can not have anyone with them due to social distancing guidelines and our immunocompromised population.

## 2021-10-05 NOTE — Patient Instructions (Signed)
[facility]  Discharge Instructions: Thank you for choosing [facility] to provide your oncology and hematology care.  If you have a lab appointment with the [facility], please come in thru the Main Entrance and check in at the main information desk.  Wear comfortable clothing and clothing appropriate for easy access to any Portacath or PICC line.   We strive to give you quality time with your provider. You may need to reschedule your appointment if you arrive late (15 or more minutes).  Arriving late affects you and other patients whose appointments are after yours.  Also, if you miss three or more appointments without notifying the office, you may be dismissed from the clinic at the provider's discretion.      For prescription refill requests, have your pharmacy contact our office and allow 72 hours for refills to be completed.    Today you received the following chemotherapy and/or immunotherapy agents Folfox   To help prevent nausea and vomiting after your treatment, we encourage you to take your nausea medication as directed.  BELOW ARE SYMPTOMS THAT SHOULD BE REPORTED IMMEDIATELY: *FEVER GREATER THAN 100.4 F (38 C) OR HIGHER *CHILLS OR SWEATING *NAUSEA AND VOMITING THAT IS NOT CONTROLLED WITH YOUR NAUSEA MEDICATION *UNUSUAL SHORTNESS OF BREATH *UNUSUAL BRUISING OR BLEEDING *URINARY PROBLEMS (pain or burning when urinating, or frequent urination) *BOWEL PROBLEMS (unusual diarrhea, constipation, pain near the anus) TENDERNESS IN MOUTH AND THROAT WITH OR WITHOUT PRESENCE OF ULCERS (sore throat, sores in mouth, or a toothache) UNUSUAL RASH, SWELLING OR PAIN  UNUSUAL VAGINAL DISCHARGE OR ITCHING   Items with * indicate a potential emergency and should be followed up as soon as possible or go to the Emergency Department if any problems should occur.  Please show the CHEMOTHERAPY ALERT CARD or IMMUNOTHERAPY ALERT CARD at check-in to the Emergency  Department and triage nurse.  Should you have questions after your visit or need to cancel or reschedule your appointment, please contact [facility] [phone]  and follow the prompts.  Office hours are 8:00 a.m. to 4:30 p.m. Monday - Friday. Please note that voicemails left after 4:00 p.m. may not be returned until the following business day.  We are closed weekends and major holidays. You have access to a nurse at all times for urgent questions. Please call the main number to the clinic [phone] and follow the prompts.  For any non-urgent questions, you may also contact your provider using MyChart. We now offer e-Visits for anyone 91 and older to request care online for non-urgent symptoms. For details visit [email or web address].   Also download the MyChart app! Go to the app store, search "MyChart", open the app, select [facility], and log in with your MyChart username and password.  Masks are optional in the cancer centers. If you would like for your care team to wear a mask while they are taking care of you, please let them know. For doctor visits, patients may have with them one support person who is at least 71 years old. At this time, visitors are not allowed in the infusion area.  Oxaliplatin Injection What is this medication? OXALIPLATIN (ox AL i PLA tin) treats some types of cancer. It works by slowing down the growth of cancer cells. This medicine may be used for other purposes; ask your health care provider or pharmacist if you have questions. COMMON BRAND NAME(S): Eloxatin What should I tell my care team before I take  this medication? They need to know if you have any of these conditions: Heart disease History of irregular heartbeat or rhythm Liver disease Low blood cell levels (white cells, red cells, and platelets) Lung or breathing disease, such as asthma Take medications that treat or prevent blood clots Tingling of the fingers, toes, or other  nerve disorder An unusual or allergic reaction to oxaliplatin, other medications, foods, dyes, or preservatives If you or your partner are pregnant or trying to get pregnant Breast-feeding How should I use this medication? This medication is injected into a vein. It is given by your care team in a hospital or clinic setting. Talk to your care team about the use of this medication in children. Special care may be needed. Overdosage: If you think you have taken too much of this medicine contact a poison control center or emergency room at once. NOTE: This medicine is only for you. Do not share this medicine with others. What if I miss a dose? Keep appointments for follow-up doses. It is important not to miss a dose. Call your care team if you are unable to keep an appointment. What may interact with this medication? Do not take this medication with any of the following: Cisapride Dronedarone Pimozide Thioridazine This medication may also interact with the following: Aspirin and aspirin-like medications Certain medications that treat or prevent blood clots, such as warfarin, apixaban, dabigatran, and rivaroxaban Cisplatin Cyclosporine Diuretics Medications for infection, such as acyclovir, adefovir, amphotericin B, bacitracin, cidofovir, foscarnet, ganciclovir, gentamicin, pentamidine, vancomycin NSAIDs, medications for pain and inflammation, such as ibuprofen or naproxen Other medications that cause heart rhythm changes Pamidronate Zoledronic acid This list may not describe all possible interactions. Give your health care provider a list of all the medicines, herbs, non-prescription drugs, or dietary supplements you use. Also tell them if you smoke, drink alcohol, or use illegal drugs. Some items may interact with your medicine. What should I watch for while using this medication? Your condition will be monitored carefully while you are receiving this medication. You may need blood work  while taking this medication. This medication may make you feel generally unwell. This is not uncommon as chemotherapy can affect healthy cells as well as cancer cells. Report any side effects. Continue your course of treatment even though you feel ill unless your care team tells you to stop. This medication may increase your risk of getting an infection. Call your care team for advice if you get a fever, chills, sore throat, or other symptoms of a cold or flu. Do not treat yourself. Try to avoid being around people who are sick. Avoid taking medications that contain aspirin, acetaminophen, ibuprofen, naproxen, or ketoprofen unless instructed by your care team. These medications may hide a fever. Be careful brushing or flossing your teeth or using a toothpick because you may get an infection or bleed more easily. If you have any dental work done, tell your dentist you are receiving this medication. This medication can make you more sensitive to cold. Do not drink cold drinks or use ice. Cover exposed skin before coming in contact with cold temperatures or cold objects. When out in cold weather wear warm clothing and cover your mouth and nose to warm the air that goes into your lungs. Tell your care team if you get sensitive to the cold. Talk to your care team if you or your partner are pregnant or think either of you might be pregnant. This medication can cause serious birth defects if  taken during pregnancy and for 9 months after the last dose. A negative pregnancy test is required before starting this medication. A reliable form of contraception is recommended while taking this medication and for 9 months after the last dose. Talk to your care team about effective forms of contraception. Do not father a child while taking this medication and for 6 months after the last dose. Use a condom while having sex during this time period. Do not breastfeed while taking this medication and for 3 months after the last  dose. This medication may cause infertility. Talk to your care team if you are concerned about your fertility. What side effects may I notice from receiving this medication? Side effects that you should report to your care team as soon as possible: Allergic reactions--skin rash, itching, hives, swelling of the face, lips, tongue, or throat Bleeding--bloody or black, tar-like stools, vomiting blood or brown material that looks like coffee grounds, red or dark brown urine, small red or purple spots on skin, unusual bruising or bleeding Dry cough, shortness of breath or trouble breathing Heart rhythm changes--fast or irregular heartbeat, dizziness, feeling faint or lightheaded, chest pain, trouble breathing Infection--fever, chills, cough, sore throat, wounds that don't heal, pain or trouble when passing urine, general feeling of discomfort or being unwell Liver injury--right upper belly pain, loss of appetite, nausea, light-colored stool, dark yellow or brown urine, yellowing skin or eyes, unusual weakness or fatigue Low red blood cell level--unusual weakness or fatigue, dizziness, headache, trouble breathing Muscle injury--unusual weakness or fatigue, muscle pain, dark yellow or brown urine, decrease in amount of urine Pain, tingling, or numbness in the hands or feet Sudden and severe headache, confusion, change in vision, seizures, which may be signs of posterior reversible encephalopathy syndrome (PRES) Unusual bruising or bleeding Side effects that usually do not require medical attention (report to your care team if they continue or are bothersome): Diarrhea Nausea Pain, redness, or swelling with sores inside the mouth or throat Unusual weakness or fatigue Vomiting This list may not describe all possible side effects. Call your doctor for medical advice about side effects. You may report side effects to FDA at 1-800-FDA-1088. Where should I keep my medication? This medication is given in a  hospital or clinic. It will not be stored at home. NOTE: This sheet is a summary. It may not cover all possible information. If you have questions about this medicine, talk to your doctor, pharmacist, or health care provider.  2023 Elsevier/Gold Standard (2021-06-04 00:00:00)  Leucovorin Injection What is this medication? LEUCOVORIN (loo koe VOR in) prevents side effects from certain medications, such as methotrexate. It works by increasing folate levels. This helps protect healthy cells in your body. It may also be used to treat anemia caused by low levels of folate. It can also be used with fluorouracil, a type of chemotherapy, to treat colorectal cancer. It works by increasing the effects of fluorouracil in the body. This medicine may be used for other purposes; ask your health care provider or pharmacist if you have questions. What should I tell my care team before I take this medication? They need to know if you have any of these conditions: Anemia from low levels of vitamin B12 in the blood An unusual or allergic reaction to leucovorin, folic acid, other medications, foods, dyes, or preservatives Pregnant or trying to get pregnant Breastfeeding How should I use this medication? This medication is injected into a vein or a muscle. It is given by  your care team in a hospital or clinic setting. Talk to your care team about the use of this medication in children. Special care may be needed. Overdosage: If you think you have taken too much of this medicine contact a poison control center or emergency room at once. NOTE: This medicine is only for you. Do not share this medicine with others. What if I miss a dose? Keep appointments for follow-up doses. It is important not to miss your dose. Call your care team if you are unable to keep an appointment. What may interact with this medication? Capecitabine Fluorouracil Phenobarbital Phenytoin Primidone Trimethoprim;sulfamethoxazole This list  may not describe all possible interactions. Give your health care provider a list of all the medicines, herbs, non-prescription drugs, or dietary supplements you use. Also tell them if you smoke, drink alcohol, or use illegal drugs. Some items may interact with your medicine. What should I watch for while using this medication? Your condition will be monitored carefully while you are receiving this medication. This medication may increase the side effects of 5-fluorouracil. Tell your care team if you have diarrhea or mouth sores that do not get better or that get worse. What side effects may I notice from receiving this medication? Side effects that you should report to your care team as soon as possible: Allergic reactions--skin rash, itching, hives, swelling of the face, lips, tongue, or throat This list may not describe all possible side effects. Call your doctor for medical advice about side effects. You may report side effects to FDA at 1-800-FDA-1088. Where should I keep my medication? This medication is given in a hospital or clinic. It will not be stored at home. NOTE: This sheet is a summary. It may not cover all possible information. If you have questions about this medicine, talk to your doctor, pharmacist, or health care provider.  2023 Elsevier/Gold Standard (2021-06-18 00:00:00) Fluorouracil Injection What is this medication? FLUOROURACIL (flure oh YOOR a sil) treats some types of cancer. It works by slowing down the growth of cancer cells. This medicine may be used for other purposes; ask your health care provider or pharmacist if you have questions. COMMON BRAND NAME(S): Adrucil What should I tell my care team before I take this medication? They need to know if you have any of these conditions: Blood disorders Dihydropyrimidine dehydrogenase (DPD) deficiency Infection, such as chickenpox, cold sores, herpes Kidney disease Liver disease Poor nutrition Recent or ongoing  radiation therapy An unusual or allergic reaction to fluorouracil, other medications, foods, dyes, or preservatives If you or your partner are pregnant or trying to get pregnant Breast-feeding How should I use this medication? This medication is injected into a vein. It is administered by your care team in a hospital or clinic setting. Talk to your care team about the use of this medication in children. Special care may be needed. Overdosage: If you think you have taken too much of this medicine contact a poison control center or emergency room at once. NOTE: This medicine is only for you. Do not share this medicine with others. What if I miss a dose? Keep appointments for follow-up doses. It is important not to miss your dose. Call your care team if you are unable to keep an appointment. What may interact with this medication? Do not take this medication with any of the following: Live virus vaccines This medication may also interact with the following: Medications that treat or prevent blood clots, such as warfarin, enoxaparin, dalteparin This list  may not describe all possible interactions. Give your health care provider a list of all the medicines, herbs, non-prescription drugs, or dietary supplements you use. Also tell them if you smoke, drink alcohol, or use illegal drugs. Some items may interact with your medicine. What should I watch for while using this medication? Your condition will be monitored carefully while you are receiving this medication. This medication may make you feel generally unwell. This is not uncommon as chemotherapy can affect healthy cells as well as cancer cells. Report any side effects. Continue your course of treatment even though you feel ill unless your care team tells you to stop. In some cases, you may be given additional medications to help with side effects. Follow all directions for their use. This medication may increase your risk of getting an infection.  Call your care team for advice if you get a fever, chills, sore throat, or other symptoms of a cold or flu. Do not treat yourself. Try to avoid being around people who are sick. This medication may increase your risk to bruise or bleed. Call your care team if you notice any unusual bleeding. Be careful brushing or flossing your teeth or using a toothpick because you may get an infection or bleed more easily. If you have any dental work done, tell your dentist you are receiving this medication. Avoid taking medications that contain aspirin, acetaminophen, ibuprofen, naproxen, or ketoprofen unless instructed by your care team. These medications may hide a fever. Do not treat diarrhea with over the counter products. Contact your care team if you have diarrhea that lasts more than 2 days or if it is severe and watery. This medication can make you more sensitive to the sun. Keep out of the sun. If you cannot avoid being in the sun, wear protective clothing and sunscreen. Do not use sun lamps, tanning beds, or tanning booths. Talk to your care team if you or your partner wish to become pregnant or think you might be pregnant. This medication can cause serious birth defects if taken during pregnancy and for 3 months after the last dose. A reliable form of contraception is recommended while taking this medication and for 3 months after the last dose. Talk to your care team about effective forms of contraception. Do not father a child while taking this medication and for 3 months after the last dose. Use a condom while having sex during this time period. Do not breastfeed while taking this medication. This medication may cause infertility. Talk to your care team if you are concerned about your fertility. What side effects may I notice from receiving this medication? Side effects that you should report to your care team as soon as possible: Allergic reactions--skin rash, itching, hives, swelling of the face, lips,  tongue, or throat Heart attack--pain or tightness in the chest, shoulders, arms, or jaw, nausea, shortness of breath, cold or clammy skin, feeling faint or lightheaded Heart failure--shortness of breath, swelling of the ankles, feet, or hands, sudden weight gain, unusual weakness or fatigue Heart rhythm changes--fast or irregular heartbeat, dizziness, feeling faint or lightheaded, chest pain, trouble breathing High ammonia level--unusual weakness or fatigue, confusion, loss of appetite, nausea, vomiting, seizures Infection--fever, chills, cough, sore throat, wounds that don't heal, pain or trouble when passing urine, general feeling of discomfort or being unwell Low red blood cell level--unusual weakness or fatigue, dizziness, headache, trouble breathing Pain, tingling, or numbness in the hands or feet, muscle weakness, change in vision, confusion or trouble  speaking, loss of balance or coordination, trouble walking, seizures Redness, swelling, and blistering of the skin over hands and feet Severe or prolonged diarrhea Unusual bruising or bleeding Side effects that usually do not require medical attention (report to your care team if they continue or are bothersome): Dry skin Headache Increased tears Nausea Pain, redness, or swelling with sores inside the mouth or throat Sensitivity to light Vomiting This list may not describe all possible side effects. Call your doctor for medical advice about side effects. You may report side effects to FDA at 1-800-FDA-1088. Where should I keep my medication? This medication is given in a hospital or clinic. It will not be stored at home. NOTE: This sheet is a summary. It may not cover all possible information. If you have questions about this medicine, talk to your doctor, pharmacist, or health care provider.  2023 Elsevier/Gold Standard (2021-06-15 00:00:00)

## 2021-10-05 NOTE — Progress Notes (Signed)
Patients port flushed without difficulty.  Good blood return noted with no bruising or swelling noted at site.  Stable during access and blood draw. 

## 2021-10-07 ENCOUNTER — Inpatient Hospital Stay: Payer: Medicare Other

## 2021-10-07 VITALS — BP 135/71 | HR 67 | Temp 97.6°F | Resp 18

## 2021-10-07 DIAGNOSIS — Z9049 Acquired absence of other specified parts of digestive tract: Secondary | ICD-10-CM | POA: Diagnosis not present

## 2021-10-07 DIAGNOSIS — C183 Malignant neoplasm of hepatic flexure: Secondary | ICD-10-CM

## 2021-10-07 DIAGNOSIS — F17210 Nicotine dependence, cigarettes, uncomplicated: Secondary | ICD-10-CM | POA: Diagnosis not present

## 2021-10-07 DIAGNOSIS — D649 Anemia, unspecified: Secondary | ICD-10-CM | POA: Diagnosis not present

## 2021-10-07 DIAGNOSIS — Z95828 Presence of other vascular implants and grafts: Secondary | ICD-10-CM

## 2021-10-07 DIAGNOSIS — Z800 Family history of malignant neoplasm of digestive organs: Secondary | ICD-10-CM | POA: Diagnosis not present

## 2021-10-07 DIAGNOSIS — Z5111 Encounter for antineoplastic chemotherapy: Secondary | ICD-10-CM | POA: Diagnosis not present

## 2021-10-07 MED ORDER — SODIUM CHLORIDE 0.9% FLUSH
10.0000 mL | INTRAVENOUS | Status: DC | PRN
  Administered 2021-10-07: 10 mL

## 2021-10-07 MED ORDER — HEPARIN SOD (PORK) LOCK FLUSH 100 UNIT/ML IV SOLN
500.0000 [IU] | Freq: Once | INTRAVENOUS | Status: AC | PRN
  Administered 2021-10-07: 500 [IU]

## 2021-10-07 NOTE — Patient Instructions (Signed)
[facility]  Discharge Instructions: Thank you for choosing [facility] to provide your oncology and hematology care.  If you have a lab appointment with the [facility], please come in thru the Main Entrance and check in at the main information desk.  Wear comfortable clothing and clothing appropriate for easy access to any Portacath or PICC line.   We strive to give you quality time with your provider. You may need to reschedule your appointment if you arrive late (15 or more minutes).  Arriving late affects you and other patients whose appointments are after yours.  Also, if you miss three or more appointments without notifying the office, you may be dismissed from the clinic at the provider's discretion.      For prescription refill requests, have your pharmacy contact our office and allow 72 hours for refills to be completed.    Today you received the following Chemo pump disconnected with no alarms noted.   To help prevent nausea and vomiting after your treatment, we encourage you to take your nausea medication as directed.  BELOW ARE SYMPTOMS THAT SHOULD BE REPORTED IMMEDIATELY: *FEVER GREATER THAN 100.4 F (38 C) OR HIGHER *CHILLS OR SWEATING *NAUSEA AND VOMITING THAT IS NOT CONTROLLED WITH YOUR NAUSEA MEDICATION *UNUSUAL SHORTNESS OF BREATH *UNUSUAL BRUISING OR BLEEDING *URINARY PROBLEMS (pain or burning when urinating, or frequent urination) *BOWEL PROBLEMS (unusual diarrhea, constipation, pain near the anus) TENDERNESS IN MOUTH AND THROAT WITH OR WITHOUT PRESENCE OF ULCERS (sore throat, sores in mouth, or a toothache) UNUSUAL RASH, SWELLING OR PAIN  UNUSUAL VAGINAL DISCHARGE OR ITCHING   Items with * indicate a potential emergency and should be followed up as soon as possible or go to the Emergency Department if any problems should occur.  Please show the CHEMOTHERAPY ALERT CARD or IMMUNOTHERAPY ALERT CARD at check-in to the Emergency  Department and triage nurse.  Should you have questions after your visit or need to cancel or reschedule your appointment, please contact [facility] [phone]  and follow the prompts.  Office hours are 8:00 a.m. to 4:30 p.m. Monday - Friday. Please note that voicemails left after 4:00 p.m. may not be returned until the following business day.  We are closed weekends and major holidays. You have access to a nurse at all times for urgent questions. Please call the main number to the clinic [phone] and follow the prompts.  For any non-urgent questions, you may also contact your provider using MyChart. We now offer e-Visits for anyone 64 and older to request care online for non-urgent symptoms. For details visit [email or web address].   Also download the MyChart app! Go to the app store, search "MyChart", open the app, select [facility], and log in with your MyChart username and password.  Masks are optional in the cancer centers. If you would like for your care team to wear a mask while they are taking care of you, please let them know. You may have one support person who is at least 71 years old accompany you for your appointments.

## 2021-10-07 NOTE — Progress Notes (Signed)
Patient presents today for pump d/c.  Port flushed with good blood return noted. No bruising or swelling at site. Bandaid applied and patient discharged in satisfactory condition. VVS stable with no signs or symptoms of distressed noted.  

## 2021-10-08 ENCOUNTER — Telehealth: Payer: Self-pay | Admitting: *Deleted

## 2021-10-08 ENCOUNTER — Encounter (HOSPITAL_COMMUNITY): Payer: Self-pay | Admitting: Hematology

## 2021-10-08 ENCOUNTER — Encounter: Payer: Self-pay | Admitting: Hematology

## 2021-10-08 NOTE — Telephone Encounter (Signed)
Call placed for 24 hour chemotherapy follow-up. Pt stated he felt good. Pt denies nausea, vomiting, headaches, dizziness, shortness of breath, and diarrhea. Pt advised to call the clinic if needed. Pt verbalized understanding.

## 2021-10-08 NOTE — Progress Notes (Signed)
[place], [place] [address]   CLINIC:  Medical Oncology/Hematology  PCP:  [name], FNP [name] [place] [address] [phone]   REASON FOR VISIT:  Follow-up for stage 3B right colon cancer  PRIOR THERAPY: Resection of splenic flexure mass on 04/23/2021 by Dr. Marcello Moores, right colectomy on 08/18/2021  NGS Results:  MSI-stable  CURRENT THERAPY: Surveillance   CANCER STAGING:  Cancer Staging  Cancer of left colon [facility]) Staging form: Colon and Rectum, AJCC 8th Edition - Clinical stage from 05/11/2021: Stage IIA (cT3, cN0, cM0) - Unsigned  Primary cancer of hepatic flexure s/p robotic right colectomy 08/18/2021 Staging form: Colon and Rectum, AJCC 8th Edition - Clinical stage from 09/23/2021: Stage IIIB (cT4a, cN1a, cM0) - Unsigned   INTERVAL HISTORY:  [name], a 71 y.o. male, seen today for follow-up of stage IIIb right colon cancer and initiation of chemotherapy in the adjuvant setting.  Denies any tingling or numbness in the extremities.  Energy and appetite levels are improving.  REVIEW OF SYSTEMS:  Review of Systems  Constitutional:  Negative for appetite change, fatigue and unexpected weight change.  Respiratory:  Positive for cough.   Neurological:  Negative for numbness.  All other systems reviewed and are negative.   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 - [employer] MRI 2272 dual chamber PPM   Tuberculosis    as a child, was treated   Past Surgical History:  Procedure Laterality Date   ABDOMINAL AORTIC ENDOVASCULAR STENT GRAFT Bilateral 02/26/2021    Procedure: ABDOMINAL AORTIC ENDOVASCULAR STENT GRAFT REPAIR;  Surgeon: Marty Heck, MD;  Location: [facility] OR;  Service: Vascular;  Laterality: Bilateral;   BIOPSY  03/12/2021   Procedure: BIOPSY;  Surgeon: Harvel Quale, MD;  [facility];  Service: Gastroenterology;;   BIOPSY  06/25/2021   Procedure: BIOPSY;  Surgeon: Harvel Quale, MD;  [facility];  Service: Gastroenterology;;  mass    COLONOSCOPY WITH PROPOFOL N/A 03/12/2021   Procedure: COLONOSCOPY WITH PROPOFOL;  Surgeon: Harvel Quale, MD;  [facility];  Service: Gastroenterology;  Laterality: N/A;  10:55   COLONOSCOPY WITH PROPOFOL N/A 06/25/2021   Procedure: COLONOSCOPY WITH PROPOFOL;  Surgeon: Harvel Quale, MD;  [facility];  Service: Gastroenterology;  Laterality: N/A;  235 ASA 2   HEMOSTASIS CLIP PLACEMENT  03/12/2021   Procedure: HEMOSTASIS CLIP PLACEMENT;  Surgeon: Harvel Quale, MD;  [facility];  Service: Gastroenterology;;   HEMOSTASIS CLIP PLACEMENT  06/25/2021   Procedure: HEMOSTASIS CLIP PLACEMENT;  Surgeon: Harvel Quale, MD;  [facility];  Service: Gastroenterology;;   HERNIA REPAIR Left 10/25/2006   IR IMAGING GUIDED PORT INSERTION  09/29/2021   PACEMAKER IMPLANT N/A 02/08/2021   Procedure: PACEMAKER IMPLANT;  Surgeon: Deboraha Sprang, MD;  Location: [place] [facility];  Service: Cardiovascular;  Laterality: N/A;   POLYPECTOMY  03/12/2021   Procedure: POLYPECTOMY;  Surgeon: Harvel Quale, MD;  [facility];  Service: Gastroenterology;;  POLYPECTOMY  06/25/2021   Procedure: POLYPECTOMY;  Surgeon: Harvel Quale, MD;  [facility];  Service: Gastroenterology;;   [name]  03/12/2021   Procedure: [name];  Surgeon: [name], [name], MD;  [facility]: [facility];  Service: Gastroenterology;;    SOCIAL HISTORY:  Social  History   Socioeconomic History   Marital status: Single    Spouse name: Not on file   Number of children: Not on file   Years of education: Not on file   Highest education level: Not on file  Occupational History   Not on file  Tobacco Use   Smoking status: Every Day    Packs/day: 0.75    Years: 50.00    Total pack years: 37.50    Types: Cigarettes    Passive exposure: Current   Smokeless tobacco: Never  Vaping Use   Vaping Use: Never used  Substance and Sexual Activity   Alcohol use: Yes    Alcohol/week: 2.0 - 3.0 standard drinks of alcohol    Types: 2 - 3 Shots of liquor per week    Comment: daily liquor moderatly   Drug use: Not on file    Comment: uses every other day   Sexual activity: Not Currently  Other Topics Concern   Not on file  Social History Narrative   Not on file   Social Determinants of Health   Financial Resource Strain: Not on file  Food Insecurity: Not on file  Transportation Needs: Not on file  Physical Activity: Not on file  Stress: Not on file  Social Connections: Not on file  Intimate Partner Violence: Not on file    FAMILY HISTORY:  Family History  Problem Relation Age of Onset   Heart disease Father    Colon polyps Sister        less than 10 lifetime polyps   Colon cancer Maternal Grandmother        dx 90s    CURRENT MEDICATIONS:  Current Outpatient Medications  Medication Sig Dispense Refill   albuterol (VENTOLIN HFA) 108 (90 Base) MCG/ACT inhaler Inhale 2 puffs into the lungs every 6 (six) hours as needed for wheezing or shortness of breath. 8 g 2   amLODipine (NORVASC) 2.5 MG tablet Take 1 tablet (2.5 mg total) by mouth daily. 30 tablet 1   aspirin EC 81 MG EC tablet Take 1 tablet (81 mg total) by mouth daily at 6 (six) AM. Swallow whole. 30 tablet 11   bisacodyl (DULCOLAX) 5 MG EC tablet Take 5 mg by mouth daily as needed for moderate constipation.     carvedilol (COREG) 6.25 MG tablet Take 1 tablet (6.25 mg total) by mouth 2  (two) times daily with a meal. 60 tablet 3   famotidine (PEPCID) 20 MG tablet Take 20 mg by mouth daily as needed for heartburn or indigestion.     feeding supplement (ENSURE ENLIVE / ENSURE PLUS) LIQD Take 237 mLs by mouth 3 (three) times daily between meals. (Patient taking differently: Take 237 mLs by mouth 3 (three) times daily as needed (nutrition).) 237 mL 12   fluorouracil CALGB 84665 2,400 mg/m2 in sodium chloride 0.9 % 150 mL Inject 2,400 mg/m2 into the vein over 48 hr. Every 14 days     FLUOROURACIL IV Inject into the vein every 14 (fourteen) days.     LEUCOVORIN CALCIUM IV Inject into the vein every 14 (fourteen) days.     lidocaine-prilocaine (EMLA) cream Apply a small amount to port a  cath site (do not rub in) and cover with plastic wrap 1 hour prior to infusion appointment 30 g 3   loperamide (IMODIUM A-D) 2 MG tablet Take 1 tablet (2 mg total) by mouth 4 (four) times daily as needed for diarrhea or loose stools.  0   Multiple Vitamins-Minerals (MULTIVITAMIN WITH MINERALS) tablet Take 1 tablet by mouth daily. Centrum Silver for Men 50+     nicotine (NICODERM CQ - DOSED IN MG/24 HOURS) 21 mg/24hr patch Place 21 mg onto the skin daily.     OXALIPLATIN IV Inject into the vein every 14 (fourteen) days.     prochlorperazine (COMPAZINE) 10 MG tablet Take 1 tablet (10 mg total) by mouth every 6 (six) hours as needed (Nausea or vomiting). 60 tablet 6   simvastatin (ZOCOR) 20 MG tablet Take 20 mg by mouth daily.     No current facility-administered medications for this visit.   Facility-Administered Medications Ordered in Other Visits  Medication Dose Route Frequency Provider Last Rate Last Admin   magnesium sulfate 2 GM/50ML IVPB            palonosetron (ALOXI) 0.25 MG/5ML injection             ALLERGIES:  Allergies  Allergen Reactions   Lisinopril Anaphylaxis   Naproxen Shortness Of Breath and Palpitations   Nsaids Shortness Of Breath and Palpitations    PHYSICAL EXAM:   Performance status (ECOG): 1 - Symptomatic but completely ambulatory  There were no vitals filed for this visit.  Wt Readings from Last 3 Encounters:  10/05/21 170 lb (77.1 kg)  10/01/21 167 lb 9.6 oz (76 kg)  09/29/21 163 lb (73.9 kg)   Physical Exam Vitals reviewed.  Constitutional:      Appearance: Normal appearance.  Cardiovascular:     Rate and Rhythm: Normal rate and regular rhythm.     Pulses: Normal pulses.     Heart sounds: Normal heart sounds.  Pulmonary:     Effort: Pulmonary effort is normal.     Breath sounds: Normal breath sounds.  Neurological:     General: No focal deficit present.     Mental Status: He is alert and oriented to person, place, and time.  Psychiatric:        Mood and Affect: Mood normal.        Behavior: Behavior normal.      LABORATORY DATA:  I have reviewed the labs as listed.     Latest Ref Rng & Units 10/05/2021    8:31 AM 09/16/2021    1:36 PM 09/05/2021    5:49 AM  CBC  WBC 4.0 - 10.5 K/uL 6.1  6.5  6.3   Hemoglobin 13.0 - 17.0 g/dL 11.3  11.9  11.6   Hematocrit 39.0 - 52.0 % 34.4  37.0  33.7   Platelets 150 - 400 K/uL 164  251  270       Latest Ref Rng & Units 10/05/2021    8:31 AM 09/16/2021    1:36 PM 09/06/2021    5:03 AM  CMP  Glucose 70 - 99 mg/dL 119  101  91   BUN 8 - 23 mg/dL 22  42  79   Creatinine 0.61 - 1.24 mg/dL 1.78  2.12  2.76   Sodium 135 - 145 mmol/L 139  137  136   Potassium 3.5 - 5.1 mmol/L 4.6  5.1  3.8   Chloride 98 - 111 mmol/L 113  112  107  CO2 22 - 32 mmol/L 19  20  20    Calcium 8.9 - 10.3 mg/dL 9.1  9.3  8.9   Total Protein 6.5 - 8.1 g/dL 7.4  8.0  7.1   Total Bilirubin 0.3 - 1.2 mg/dL 0.8  0.4  0.5   Alkaline Phos 38 - 126 U/L 101  113  99   AST 15 - 41 U/L 21  23  31    ALT 0 - 44 U/L 17  36  34     DIAGNOSTIC IMAGING:  I have independently reviewed the scans and discussed with the patient. IR IMAGING GUIDED PORT INSERTION  Result Date: 09/29/2021 CLINICAL DATA:  Colon cancer, ACCESS FOR  CHEMOTHERAPY EXAM: RIGHT INTERNAL JUGULAR SINGLE LUMEN POWER PORT CATHETER INSERTION Date:  09/29/2021 09/29/2021 10:45 am Radiologist:  M. Daryll Brod, MD Guidance:  Ultrasound and fluoroscopic MEDICATIONS: 1% lidocaine local ANESTHESIA/SEDATION: Versed 1.0 mg IV; Fentanyl 25 mcg IV; Moderate Sedation Time:  22 minutes The patient was continuously monitored during the procedure by the interventional radiology nurse under my direct supervision. FLUOROSCOPY: 0 minutes, 42 seconds (3 mGy) COMPLICATIONS: None immediate. CONTRAST:  None PROCEDURE: Informed consent was obtained from the patient following explanation of the procedure, risks, benefits and alternatives. The patient understands, agrees and consents for the procedure. All questions were addressed. A time out was performed. Maximal barrier sterile technique utilized including caps, mask, sterile gowns, sterile gloves, large sterile drape, hand hygiene, and 2% chlorhexidine scrub. Under sterile conditions and local anesthesia, right internal jugular micropuncture venous access was performed. Access was performed with ultrasound. Images were obtained for documentation of the patent right internal jugular vein. A guide wire was inserted followed by a transitional dilator. This allowed insertion of a guide wire and catheter into the IVC. Measurements were obtained from the SVC / RA junction back to the right IJ venotomy site. In the right infraclavicular chest, a subcutaneous pocket was created over the second anterior rib. This was done under sterile conditions and local anesthesia. 1% lidocaine with epinephrine was utilized for this. A 2.5 cm incision was made in the skin. Blunt dissection was performed to create a subcutaneous pocket over the right pectoralis major muscle. The pocket was flushed with saline vigorously. There was adequate hemostasis. The port catheter was assembled and checked for leakage. The port catheter was secured in the pocket with two  retention sutures. The tubing was tunneled subcutaneously to the right venotomy site and inserted into the SVC/RA junction through a valved peel-away sheath. Position was confirmed with fluoroscopy. Images were obtained for documentation. The patient tolerated the procedure well. No immediate complications. Incisions were closed in a two layer fashion with 4 - 0 Vicryl suture. Dermabond was applied to the skin. The port catheter was accessed, blood was aspirated followed by saline and heparin flushes. Needle was removed. A dry sterile dressing was applied. IMPRESSION: Ultrasound and fluoroscopically guided right internal jugular single lumen power port catheter insertion. Tip in the SVC/RA junction. Catheter ready for use. Electronically Signed   By: Jerilynn Mages.  Shick M.D.   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 - Colonoscopy on 03/12/2021: Fungating partially obstructing large mass found at 40 cm proximal to the anus, circumferential with no bleeding present. - CEA on 03/12/2021: 8.3 - CT angio abdomen and pelvis on 04/08/2021: No evidence of metastatic lymphadenopathy or liver mets.  Stable 7 mm subpleural pulmonary  nodule in the periphery of the right lower lobe. - CT angio CAP on 02/04/2021: No evidence of lung metastasis, abdominal metastasis. - Resection of splenic flexure mass on 04/23/2021 by Dr. Marcello Moores. - Pathology: Moderately differentiated adenocarcinoma, invades into subserosa, margins negative, 0/38 lymph nodes.  No LVI or perineural invasion.  PT3 pN0.  MSI-stable. - Guardant reveal (05/21/2021): CT DNA was detected. - PET scan on 06/10/2021 shows 2 cm hypermetabolic soft tissue density in the transverse colon.  No evidence of local or distant metastatic disease.    Social/family history: - He is a retired Cabin [employer]. - Current active smoker, half pack per day for 50 years. - Mother had colon cancer at age  85.  Maternal grandmother had colon cancer.  3.  Stage III (E1RA3) right colon adenocarcinoma: - Right colectomy on 08/10/2021 by Dr. Marcello Moores. - Pathology with grade 2 moderately differentiated colonic adenocarcinoma, tumor site ascending colon, margins negative, 1/22 lymph nodes involved, [id number].  MMR preserved.        -Cycle 1 of FOLFOX started on 09/30/2021   PLAN:  Stage III (T4AN1) right colon adenocarcinoma, MMR preserved: - He has port placed.  I reviewed the labs today which showed normal LFTs and CBC.       - We talked about FOLFOX chemotherapy in the adjuvant setting for total of 6 months.  We discussed the schedule and side effects in detail.  He will proceed with cycle 1 today with 20% dose reduction. - RTC 2 weeks for follow-up for cycle 2.  If he tolerated cycle 1 well, will increase the dose at next cycle.  2.  Normocytic anemia: - Combination anemia from CKD and relative iron deficiency.  Hemoglobin today is 11.3.  We will plan to check ferritin and iron panel in the future.   Orders placed this encounter:  No orders of the defined types were placed in this encounter.    Derek Jack, MD [place] [phone]

## 2021-10-14 ENCOUNTER — Inpatient Hospital Stay (HOSPITAL_COMMUNITY): Payer: Medicare Other | Attending: Hematology | Admitting: Licensed Clinical Social Worker

## 2021-10-14 DIAGNOSIS — C186 Malignant neoplasm of descending colon: Secondary | ICD-10-CM

## 2021-10-14 NOTE — Progress Notes (Signed)
[place] [facility]  Initial Assessment   Fernando Dean is a 71 y.o. year old male contacted by phone. Clinical Social Work was referred by medical provider for assessment of psychosocial needs.   SDOH (Social Determinants of Health) assessments performed: Yes   SDOH Screenings   Alcohol Screen: Not on file  Depression (PHQ2-9): Low Risk  (05/18/2021)   Depression (PHQ2-9)    PHQ-2 Score: 0  Financial Resource Strain: Not on file  Food Insecurity: Not on file  Housing: Not on file  Physical Activity: Not on file  Social Connections: Not on file  Stress: Not on file  Tobacco Use: High Risk (10/07/2021)   Patient History    Smoking Tobacco Use: Every Day    Smokeless Tobacco Use: Never    Passive Exposure: Current  Transportation Needs: Not on file     Distress Screen completed: No     No data to display            Family/Social Information:  Housing Arrangement: patient lives with his sister, Davari Lopes. Family members/support persons in your life? Family Transportation concerns: no  Employment: Retired Cabin crew.  Income source: Paediatric nurse concerns: Yes, current concerns Type of concern: Utilities Food access concerns: no Religious or spiritual practice: Not known Services Currently in place:  none  Coping/ Adjustment to diagnosis: Patient understands treatment plan and what happens next? yes Concerns about diagnosis and/or treatment: How will I care for myself and Quality of life Patient reported stressors: Finances Hopes and/or priorities: Pt's priority is to continue treatment w/ the hope of positive results Patient enjoys being outside Current coping skills/ strengths: Motivation for treatment/growth  and Physical Health     SUMMARY: Current SDOH Barriers:  Financial constraints related to fixed income  Clinical Social Work Clinical Goal(s):  Explore community resource options for unmet needs related to:   Financial Strain   Interventions: Discussed common feeling and emotions when being diagnosed with cancer, and the importance of support during treatment Informed patient of the support team roles and support services at [facility] Provided [facility] contact information and encouraged patient to call with any questions or concerns Referred patient to Duanne Limerick   Follow Up Plan: Patient will contact CSW with any support or resource needs Patient verbalizes understanding of plan: Yes    Henriette Combs, LCSW

## 2021-10-19 ENCOUNTER — Inpatient Hospital Stay: Payer: Medicare Other

## 2021-10-19 ENCOUNTER — Inpatient Hospital Stay: Payer: Medicare Other | Admitting: Hematology

## 2021-10-19 ENCOUNTER — Encounter: Payer: Self-pay | Admitting: Hematology

## 2021-10-19 VITALS — BP 143/79 | HR 67 | Temp 98.4°F | Resp 18 | Ht 72.0 in | Wt 167.0 lb

## 2021-10-19 VITALS — BP 146/76 | HR 67 | Temp 98.1°F | Resp 18

## 2021-10-19 DIAGNOSIS — D649 Anemia, unspecified: Secondary | ICD-10-CM | POA: Diagnosis not present

## 2021-10-19 DIAGNOSIS — Z800 Family history of malignant neoplasm of digestive organs: Secondary | ICD-10-CM | POA: Diagnosis not present

## 2021-10-19 DIAGNOSIS — C183 Malignant neoplasm of hepatic flexure: Secondary | ICD-10-CM

## 2021-10-19 DIAGNOSIS — Z9049 Acquired absence of other specified parts of digestive tract: Secondary | ICD-10-CM | POA: Diagnosis not present

## 2021-10-19 DIAGNOSIS — F17210 Nicotine dependence, cigarettes, uncomplicated: Secondary | ICD-10-CM | POA: Diagnosis not present

## 2021-10-19 DIAGNOSIS — Z5111 Encounter for antineoplastic chemotherapy: Secondary | ICD-10-CM | POA: Diagnosis not present

## 2021-10-19 DIAGNOSIS — Z95828 Presence of other vascular implants and grafts: Secondary | ICD-10-CM

## 2021-10-19 LAB — CBC WITH DIFFERENTIAL/PLATELET
Abs Immature Granulocytes: 0.02 10*3/uL (ref 0.00–0.07)
Basophils Absolute: 0 10*3/uL (ref 0.0–0.1)
Basophils Relative: 1 %
Eosinophils Absolute: 0.3 10*3/uL (ref 0.0–0.5)
Eosinophils Relative: 5 %
HCT: 35.3 % — ABNORMAL LOW (ref 39.0–52.0)
Hemoglobin: 11.7 g/dL — ABNORMAL LOW (ref 13.0–17.0)
Immature Granulocytes: 0 %
Lymphocytes Relative: 34 %
Lymphs Abs: 1.8 10*3/uL (ref 0.7–4.0)
MCH: 31.8 pg (ref 26.0–34.0)
MCHC: 33.1 g/dL (ref 30.0–36.0)
MCV: 95.9 fL (ref 80.0–100.0)
Monocytes Absolute: 0.6 10*3/uL (ref 0.1–1.0)
Monocytes Relative: 11 %
Neutro Abs: 2.5 10*3/uL (ref 1.7–7.7)
Neutrophils Relative %: 49 %
Platelets: 181 10*3/uL (ref 150–400)
RBC: 3.68 MIL/uL — ABNORMAL LOW (ref 4.22–5.81)
RDW: 14.8 % (ref 11.5–15.5)
WBC: 5.3 10*3/uL (ref 4.0–10.5)
nRBC: 0 % (ref 0.0–0.2)

## 2021-10-19 LAB — COMPREHENSIVE METABOLIC PANEL
ALT: 19 U/L (ref 0–44)
AST: 24 U/L (ref 15–41)
Albumin: 3.6 g/dL (ref 3.5–5.0)
Alkaline Phosphatase: 109 U/L (ref 38–126)
Anion gap: 6 (ref 5–15)
BUN: 22 mg/dL (ref 8–23)
CO2: 20 mmol/L — ABNORMAL LOW (ref 22–32)
Calcium: 8.9 mg/dL (ref 8.9–10.3)
Chloride: 113 mmol/L — ABNORMAL HIGH (ref 98–111)
Creatinine, Ser: 1.51 mg/dL — ABNORMAL HIGH (ref 0.61–1.24)
GFR, Estimated: 49 mL/min — ABNORMAL LOW (ref >60)
Glucose, Bld: 96 mg/dL (ref 70–99)
Potassium: 4.5 mmol/L (ref 3.5–5.1)
Sodium: 139 mmol/L (ref 135–145)
Total Bilirubin: 0.5 mg/dL (ref 0.3–1.2)
Total Protein: 7.5 g/dL (ref 6.5–8.1)

## 2021-10-19 LAB — MAGNESIUM: Magnesium: 1.9 mg/dL (ref 1.7–2.4)

## 2021-10-19 MED ORDER — PALONOSETRON HCL INJECTION 0.25 MG/5ML
0.2500 mg | Freq: Once | INTRAVENOUS | Status: AC
  Administered 2021-10-19: 0.25 mg via INTRAVENOUS
  Filled 2021-10-19: qty 5

## 2021-10-19 MED ORDER — FLUOROURACIL CHEMO INJECTION 2.5 GM/50ML
320.0000 mg/m2 | Freq: Once | INTRAVENOUS | Status: AC
  Administered 2021-10-19: 600 mg via INTRAVENOUS
  Filled 2021-10-19: qty 12

## 2021-10-19 MED ORDER — SODIUM CHLORIDE 0.9 % IV SOLN
1920.0000 mg/m2 | INTRAVENOUS | Status: DC
  Administered 2021-10-19: 3750 mg via INTRAVENOUS
  Filled 2021-10-19: qty 75

## 2021-10-19 MED ORDER — SODIUM CHLORIDE 0.9 % IV SOLN
10.0000 mg | Freq: Once | INTRAVENOUS | Status: AC
  Administered 2021-10-19: 10 mg via INTRAVENOUS
  Filled 2021-10-19: qty 10

## 2021-10-19 MED ORDER — OXALIPLATIN CHEMO INJECTION 100 MG/20ML
68.0000 mg/m2 | Freq: Once | INTRAVENOUS | Status: AC
  Administered 2021-10-19: 135 mg via INTRAVENOUS
  Filled 2021-10-19: qty 20

## 2021-10-19 MED ORDER — LEUCOVORIN CALCIUM INJECTION 350 MG
320.0000 mg/m2 | Freq: Once | INTRAVENOUS | Status: AC
  Administered 2021-10-19: 624 mg via INTRAVENOUS
  Filled 2021-10-19: qty 31.2

## 2021-10-19 MED ORDER — DEXTROSE 5 % IV SOLN: Freq: Once | INTRAVENOUS | Status: AC

## 2021-10-19 NOTE — Patient Instructions (Addendum)
[facility] at [facility] Discharge Instructions   You were seen and examined today by Dr. Katragadda.  He reviewed the results of your lab work which are normal/stable.   We will proceed with your treatment today.  Return as scheduled.    Thank you for choosing [facility] at [facility] to provide your oncology and hematology care.  To afford each patient quality time with our provider, please arrive at least 15 minutes before your scheduled appointment time.   If you have a lab appointment with the Cancer Center please come in thru the Main Entrance and check in at the main information desk.  You need to re-schedule your appointment should you arrive 10 or more minutes late.  We strive to give you quality time with our providers, and arriving late affects you and other patients whose appointments are after yours.  Also, if you no show three or more times for appointments you may be dismissed from the clinic at the providers discretion.     Again, thank you for choosing [facility].  Our hope is that these requests will decrease the amount of time that you wait before being seen by our physicians.       _____________________________________________________________  Should you have questions after your visit to  Cancer Center, please contact our office at [phone] and follow the prompts.  Our office hours are 8:00 a.m. and 4:30 p.m. Monday - Friday.  Please note that voicemails left after 4:00 p.m. may not be returned until the following business day.  We are closed weekends and major holidays.  You do have access to a nurse 24-7, just call the main number to the clinic [phone] and do not press any options, hold on the line and a nurse will answer the phone.    For prescription refill requests, have your pharmacy contact our office and allow 72 hours.    Due to Covid, you will need to wear a mask upon entering  the hospital. If you do not have a mask, a mask will be given to you at the Main Entrance upon arrival. For doctor visits, patients may have 1 support person age 18 or older with them. For treatment visits, patients can not have anyone with them due to social distancing guidelines and our immunocompromised population.      

## 2021-10-19 NOTE — Patient Instructions (Signed)
[facility]  Discharge Instructions: Thank you for choosing [facility] to provide your oncology and hematology care.  If you have a lab appointment with the [facility], please come in thru the Main Entrance and check in at the main information desk.  Wear comfortable clothing and clothing appropriate for easy access to any Portacath or PICC line.   We strive to give you quality time with your provider. You may need to reschedule your appointment if you arrive late (15 or more minutes).  Arriving late affects you and other patients whose appointments are after yours.  Also, if you miss three or more appointments without notifying the office, you may be dismissed from the clinic at the provider's discretion.      For prescription refill requests, have your pharmacy contact our office and allow 72 hours for refills to be completed.    Today you received the following chemotherapy and/or immunotherapy agents Folfox Fluorouracil Injection What is this medication? FLUOROURACIL (flure oh YOOR a sil) treats some types of cancer. It works by slowing down the growth of cancer cells. This medicine may be used for other purposes; ask your health care provider or pharmacist if you have questions. COMMON BRAND NAME(S): Adrucil What should I tell my care team before I take this medication? They need to know if you have any of these conditions: Blood disorders Dihydropyrimidine dehydrogenase (DPD) deficiency Infection, such as chickenpox, cold sores, herpes Kidney disease Liver disease Poor nutrition Recent or ongoing radiation therapy An unusual or allergic reaction to fluorouracil, other medications, foods, dyes, or preservatives If you or your partner are pregnant or trying to get pregnant Breast-feeding How should I use this medication? This medication is injected into a vein. It is administered by your care team in a hospital or clinic setting. Talk to your  care team about the use of this medication in children. Special care may be needed. Overdosage: If you think you have taken too much of this medicine contact a poison control center or emergency room at once. NOTE: This medicine is only for you. Do not share this medicine with others. What if I miss a dose? Keep appointments for follow-up doses. It is important not to miss your dose. Call your care team if you are unable to keep an appointment. What may interact with this medication? Do not take this medication with any of the following: Live virus vaccines This medication may also interact with the following: Medications that treat or prevent blood clots, such as warfarin, enoxaparin, dalteparin This list may not describe all possible interactions. Give your health care provider a list of all the medicines, herbs, non-prescription drugs, or dietary supplements you use. Also tell them if you smoke, drink alcohol, or use illegal drugs. Some items may interact with your medicine. What should I watch for while using this medication? Your condition will be monitored carefully while you are receiving this medication. This medication may make you feel generally unwell. This is not uncommon as chemotherapy can affect healthy cells as well as cancer cells. Report any side effects. Continue your course of treatment even though you feel ill unless your care team tells you to stop. In some cases, you may be given additional medications to help with side effects. Follow all directions for their use. This medication may increase your risk of getting an infection. Call your care team for advice if you get a fever, chills, sore throat, or other symptoms of  a cold or flu. Do not treat yourself. Try to avoid being around people who are sick. This medication may increase your risk to bruise or bleed. Call your care team if you notice any unusual bleeding. Be careful brushing or flossing your teeth or using a  toothpick because you may get an infection or bleed more easily. If you have any dental work done, tell your dentist you are receiving this medication. Avoid taking medications that contain aspirin, acetaminophen, ibuprofen, naproxen, or ketoprofen unless instructed by your care team. These medications may hide a fever. Do not treat diarrhea with over the counter products. Contact your care team if you have diarrhea that lasts more than 2 days or if it is severe and watery. This medication can make you more sensitive to the sun. Keep out of the sun. If you cannot avoid being in the sun, wear protective clothing and sunscreen. Do not use sun lamps, tanning beds, or tanning booths. Talk to your care team if you or your partner wish to become pregnant or think you might be pregnant. This medication can cause serious birth defects if taken during pregnancy and for 3 months after the last dose. A reliable form of contraception is recommended while taking this medication and for 3 months after the last dose. Talk to your care team about effective forms of contraception. Do not father a child while taking this medication and for 3 months after the last dose. Use a condom while having sex during this time period. Do not breastfeed while taking this medication. This medication may cause infertility. Talk to your care team if you are concerned about your fertility. What side effects may I notice from receiving this medication? Side effects that you should report to your care team as soon as possible: Allergic reactions--skin rash, itching, hives, swelling of the face, lips, tongue, or throat Heart attack--pain or tightness in the chest, shoulders, arms, or jaw, nausea, shortness of breath, cold or clammy skin, feeling faint or lightheaded Heart failure--shortness of breath, swelling of the ankles, feet, or hands, sudden weight gain, unusual weakness or fatigue Heart rhythm changes--fast or irregular heartbeat,  dizziness, feeling faint or lightheaded, chest pain, trouble breathing High ammonia level--unusual weakness or fatigue, confusion, loss of appetite, nausea, vomiting, seizures Infection--fever, chills, cough, sore throat, wounds that don't heal, pain or trouble when passing urine, general feeling of discomfort or being unwell Low red blood cell level--unusual weakness or fatigue, dizziness, headache, trouble breathing Pain, tingling, or numbness in the hands or feet, muscle weakness, change in vision, confusion or trouble speaking, loss of balance or coordination, trouble walking, seizures Redness, swelling, and blistering of the skin over hands and feet Severe or prolonged diarrhea Unusual bruising or bleeding Side effects that usually do not require medical attention (report to your care team if they continue or are bothersome): Dry skin Headache Increased tears Nausea Pain, redness, or swelling with sores inside the mouth or throat Sensitivity to light Vomiting This list may not describe all possible side effects. Call your doctor for medical advice about side effects. You may report side effects to FDA at 1-800-FDA-1088. Where should I keep my medication? This medication is given in a hospital or clinic. It will not be stored at home. NOTE: This sheet is a summary. It may not cover all possible information. If you have questions about this medicine, talk to your doctor, pharmacist, or health care provider.  2023 Elsevier/Gold Standard (2021-06-15 00:00:00) Leucovorin Injection What is this  medication? LEUCOVORIN (loo koe VOR in) prevents side effects from certain medications, such as methotrexate. It works by increasing folate levels. This helps protect healthy cells in your body. It may also be used to treat anemia caused by low levels of folate. It can also be used with fluorouracil, a type of chemotherapy, to treat colorectal cancer. It works by increasing the effects of fluorouracil  in the body. This medicine may be used for other purposes; ask your health care provider or pharmacist if you have questions. What should I tell my care team before I take this medication? They need to know if you have any of these conditions: Anemia from low levels of vitamin B12 in the blood An unusual or allergic reaction to leucovorin, folic acid, other medications, foods, dyes, or preservatives Pregnant or trying to get pregnant Breastfeeding How should I use this medication? This medication is injected into a vein or a muscle. It is given by your care team in a hospital or clinic setting. Talk to your care team about the use of this medication in children. Special care may be needed. Overdosage: If you think you have taken too much of this medicine contact a poison control center or emergency room at once. NOTE: This medicine is only for you. Do not share this medicine with others. What if I miss a dose? Keep appointments for follow-up doses. It is important not to miss your dose. Call your care team if you are unable to keep an appointment. What may interact with this medication? Capecitabine Fluorouracil Phenobarbital Phenytoin Primidone Trimethoprim;sulfamethoxazole This list may not describe all possible interactions. Give your health care provider a list of all the medicines, herbs, non-prescription drugs, or dietary supplements you use. Also tell them if you smoke, drink alcohol, or use illegal drugs. Some items may interact with your medicine. What should I watch for while using this medication? Your condition will be monitored carefully while you are receiving this medication. This medication may increase the side effects of 5-fluorouracil. Tell your care team if you have diarrhea or mouth sores that do not get better or that get worse. What side effects may I notice from receiving this medication? Side effects that you should report to your care team as soon as  possible: Allergic reactions--skin rash, itching, hives, swelling of the face, lips, tongue, or throat This list may not describe all possible side effects. Call your doctor for medical advice about side effects. You may report side effects to FDA at 1-800-FDA-1088. Where should I keep my medication? This medication is given in a hospital or clinic. It will not be stored at home. NOTE: This sheet is a summary. It may not cover all possible information. If you have questions about this medicine, talk to your doctor, pharmacist, or health care provider.  2023 Elsevier/Gold Standard (2021-06-18 00:00:00)  Oxaliplatin Injection What is this medication? OXALIPLATIN (ox AL i PLA tin) treats some types of cancer. It works by slowing down the growth of cancer cells. This medicine may be used for other purposes; ask your health care provider or pharmacist if you have questions. COMMON BRAND NAME(S): Eloxatin What should I tell my care team before I take this medication? They need to know if you have any of these conditions: Heart disease History of irregular heartbeat or rhythm Liver disease Low blood cell levels (white cells, red cells, and platelets) Lung or breathing disease, such as asthma Take medications that treat or prevent blood clots Tingling of the  fingers, toes, or other nerve disorder An unusual or allergic reaction to oxaliplatin, other medications, foods, dyes, or preservatives If you or your partner are pregnant or trying to get pregnant Breast-feeding How should I use this medication? This medication is injected into a vein. It is given by your care team in a hospital or clinic setting. Talk to your care team about the use of this medication in children. Special care may be needed. Overdosage: If you think you have taken too much of this medicine contact a poison control center or emergency room at once. NOTE: This medicine is only for you. Do not share this medicine with  others. What if I miss a dose? Keep appointments for follow-up doses. It is important not to miss a dose. Call your care team if you are unable to keep an appointment. What may interact with this medication? Do not take this medication with any of the following: Cisapride Dronedarone Pimozide Thioridazine This medication may also interact with the following: Aspirin and aspirin-like medications Certain medications that treat or prevent blood clots, such as warfarin, apixaban, dabigatran, and rivaroxaban Cisplatin Cyclosporine Diuretics Medications for infection, such as acyclovir, adefovir, amphotericin B, bacitracin, cidofovir, foscarnet, ganciclovir, gentamicin, pentamidine, vancomycin NSAIDs, medications for pain and inflammation, such as ibuprofen or naproxen Other medications that cause heart rhythm changes Pamidronate Zoledronic acid This list may not describe all possible interactions. Give your health care provider a list of all the medicines, herbs, non-prescription drugs, or dietary supplements you use. Also tell them if you smoke, drink alcohol, or use illegal drugs. Some items may interact with your medicine. What should I watch for while using this medication? Your condition will be monitored carefully while you are receiving this medication. You may need blood work while taking this medication. This medication may make you feel generally unwell. This is not uncommon as chemotherapy can affect healthy cells as well as cancer cells. Report any side effects. Continue your course of treatment even though you feel ill unless your care team tells you to stop. This medication may increase your risk of getting an infection. Call your care team for advice if you get a fever, chills, sore throat, or other symptoms of a cold or flu. Do not treat yourself. Try to avoid being around people who are sick. Avoid taking medications that contain aspirin, acetaminophen, ibuprofen, naproxen, or  ketoprofen unless instructed by your care team. These medications may hide a fever. Be careful brushing or flossing your teeth or using a toothpick because you may get an infection or bleed more easily. If you have any dental work done, tell your dentist you are receiving this medication. This medication can make you more sensitive to cold. Do not drink cold drinks or use ice. Cover exposed skin before coming in contact with cold temperatures or cold objects. When out in cold weather wear warm clothing and cover your mouth and nose to warm the air that goes into your lungs. Tell your care team if you get sensitive to the cold. Talk to your care team if you or your partner are pregnant or think either of you might be pregnant. This medication can cause serious birth defects if taken during pregnancy and for 9 months after the last dose. A negative pregnancy test is required before starting this medication. A reliable form of contraception is recommended while taking this medication and for 9 months after the last dose. Talk to your care team about effective forms of contraception. Do  not father a child while taking this medication and for 6 months after the last dose. Use a condom while having sex during this time period. Do not breastfeed while taking this medication and for 3 months after the last dose. This medication may cause infertility. Talk to your care team if you are concerned about your fertility. What side effects may I notice from receiving this medication? Side effects that you should report to your care team as soon as possible: Allergic reactions--skin rash, itching, hives, swelling of the face, lips, tongue, or throat Bleeding--bloody or black, tar-like stools, vomiting blood or brown material that looks like coffee grounds, red or dark brown urine, small red or purple spots on skin, unusual bruising or bleeding Dry cough, shortness of breath or trouble breathing Heart rhythm changes--fast  or irregular heartbeat, dizziness, feeling faint or lightheaded, chest pain, trouble breathing Infection--fever, chills, cough, sore throat, wounds that don't heal, pain or trouble when passing urine, general feeling of discomfort or being unwell Liver injury--right upper belly pain, loss of appetite, nausea, light-colored stool, dark yellow or brown urine, yellowing skin or eyes, unusual weakness or fatigue Low red blood cell level--unusual weakness or fatigue, dizziness, headache, trouble breathing Muscle injury--unusual weakness or fatigue, muscle pain, dark yellow or brown urine, decrease in amount of urine Pain, tingling, or numbness in the hands or feet Sudden and severe headache, confusion, change in vision, seizures, which may be signs of posterior reversible encephalopathy syndrome (PRES) Unusual bruising or bleeding Side effects that usually do not require medical attention (report to your care team if they continue or are bothersome): Diarrhea Nausea Pain, redness, or swelling with sores inside the mouth or throat Unusual weakness or fatigue Vomiting This list may not describe all possible side effects. Call your doctor for medical advice about side effects. You may report side effects to FDA at 1-800-FDA-1088. Where should I keep my medication? This medication is given in a hospital or clinic. It will not be stored at home. NOTE: This sheet is a summary. It may not cover all possible information. If you have questions about this medicine, talk to your doctor, pharmacist, or health care provider.  2023 Elsevier/Gold Standard (2021-06-04 00:00:00)       To help prevent nausea and vomiting after your treatment, we encourage you to take your nausea medication as directed.  BELOW ARE SYMPTOMS THAT SHOULD BE REPORTED IMMEDIATELY: *FEVER GREATER THAN 100.4 F (38 C) OR HIGHER *CHILLS OR SWEATING *NAUSEA AND VOMITING THAT IS NOT CONTROLLED WITH YOUR NAUSEA MEDICATION *UNUSUAL  SHORTNESS OF BREATH *UNUSUAL BRUISING OR BLEEDING *URINARY PROBLEMS (pain or burning when urinating, or frequent urination) *BOWEL PROBLEMS (unusual diarrhea, constipation, pain near the anus) TENDERNESS IN MOUTH AND THROAT WITH OR WITHOUT PRESENCE OF ULCERS (sore throat, sores in mouth, or a toothache) UNUSUAL RASH, SWELLING OR PAIN  UNUSUAL VAGINAL DISCHARGE OR ITCHING   Items with * indicate a potential emergency and should be followed up as soon as possible or go to the Emergency Department if any problems should occur.  Please show the CHEMOTHERAPY ALERT CARD or IMMUNOTHERAPY ALERT CARD at check-in to the Emergency Department and triage nurse.  Should you have questions after your visit or need to cancel or reschedule your appointment, please contact [facility] [phone]  and follow the prompts.  Office hours are 8:00 a.m. to 4:30 p.m. Monday - Friday. Please note that voicemails left after 4:00 p.m. may not be returned until the following business  day.  We are closed weekends and major holidays. You have access to a nurse at all times for urgent questions. Please call the main number to the clinic [phone] and follow the prompts.  For any non-urgent questions, you may also contact your provider using MyChart. We now offer e-Visits for anyone 94 and older to request care online for non-urgent symptoms. For details visit [email or web address].   Also download the MyChart app! Go to the app store, search "MyChart", open the app, select [facility], and log in with your MyChart username and password.  Masks are optional in the cancer centers. If you would like for your care team to wear a mask while they are taking care of you, please let them know. You may have one support person who is at least 71 years old accompany you for your appointments.

## 2021-10-19 NOTE — Progress Notes (Signed)
Fernando Dean, [place] [address]   CLINIC:  Medical Oncology/Hematology  PCP:  [name], FNP [address] [name] [address] [phone]   REASON FOR VISIT:  Follow-up for stage 3B right colon cancer  PRIOR THERAPY: Resection of splenic flexure mass on 04/23/2021 by Dr. Marcello Moores, right colectomy on 08/18/2021  NGS Results:  MSI-stable  CURRENT THERAPY: Surveillance   CANCER STAGING:  Cancer Staging  Cancer of left colon [facility]) Staging form: Colon and Rectum, AJCC 8th Edition - Clinical stage from 05/11/2021: Stage IIA (cT3, cN0, cM0) - Unsigned  Primary cancer of hepatic flexure s/p robotic right colectomy 08/18/2021 Staging form: Colon and Rectum, AJCC 8th Edition - Clinical stage from 09/23/2021: Stage IIIB (cT4a, cN1a, cM0) - Unsigned   INTERVAL HISTORY:  [name], a 71 y.o. male, seen today for follow-up after first cycle of chemotherapy with FOLFOX.  He reported constipation alternating with diarrhea.  It was mostly diarrhea and Imodium helped.  Denied any nausea or vomiting.  Reported left foot numbness on and off when he lays on the left side.  REVIEW OF SYSTEMS:  Review of Systems  Constitutional:  Negative for appetite change, fatigue and unexpected weight change.  Respiratory:  Negative for cough.   Gastrointestinal:  Positive for diarrhea.  Neurological:  Positive for numbness (Left foot when he lays on left side).  All other systems reviewed and are negative.   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 - [employer] MRI 2272 dual chamber PPM   Tuberculosis    as a  child, was treated   Past Surgical History:  Procedure Laterality Date   ABDOMINAL AORTIC ENDOVASCULAR STENT GRAFT Bilateral 02/26/2021   Procedure: ABDOMINAL AORTIC ENDOVASCULAR STENT GRAFT REPAIR;  Surgeon: Marty Heck, MD;  [facility];  Service: Vascular;  Laterality: Bilateral;   BIOPSY  03/12/2021   Procedure: BIOPSY;  Surgeon: Harvel Quale, MD;  [facility];  Service: Gastroenterology;;   BIOPSY  06/25/2021   Procedure: BIOPSY;  Surgeon: Harvel Quale, MD;  [facility];  Service: Gastroenterology;;  mass    COLONOSCOPY WITH PROPOFOL N/A 03/12/2021   Procedure: COLONOSCOPY WITH PROPOFOL;  Surgeon: Harvel Quale, MD;  [facility];  Service: Gastroenterology;  Laterality: N/A;  10:55   COLONOSCOPY WITH PROPOFOL N/A 06/25/2021   Procedure: COLONOSCOPY WITH PROPOFOL;  Surgeon: Harvel Quale, MD;  [facility];  Service: Gastroenterology;  Laterality: N/A;  235 ASA 2   HEMOSTASIS CLIP PLACEMENT  03/12/2021   Procedure: HEMOSTASIS CLIP PLACEMENT;  Surgeon: Harvel Quale, MD;  [facility];  Service: Gastroenterology;;   HEMOSTASIS CLIP PLACEMENT  06/25/2021   Procedure: HEMOSTASIS CLIP PLACEMENT;  Surgeon: Harvel Quale, MD;  [facility];  Service: Gastroenterology;;   HERNIA REPAIR Left 10/25/2006   IR IMAGING GUIDED PORT INSERTION  09/29/2021   PACEMAKER IMPLANT N/A 02/08/2021   Procedure: PACEMAKER IMPLANT;  Surgeon: Deboraha Sprang, MD;  Location: [facility];  Service:  Cardiovascular;  Laterality: N/A;   POLYPECTOMY  03/12/2021   Procedure: POLYPECTOMY;  Surgeon: Harvel Quale, MD;  [facility];  Service: Gastroenterology;;   POLYPECTOMY  06/25/2021   Procedure: POLYPECTOMY;  Surgeon: [name], [name], MD;  [facility];  Service: Gastroenterology;;   [name]  03/12/2021   Procedure:  [name];  Surgeon: [name], [name], MD;  [facility];  Service: Gastroenterology;;    SOCIAL HISTORY:  Social History   Socioeconomic History   Marital status: Single    Spouse name: Not on file   Number of children: Not on file   Years of education: Not on file   Highest education level: Not on file  Occupational History   Not on file  Tobacco Use   Smoking status: Every Day    Packs/day: 0.75    Years: 50.00    Total pack years: 37.50    Types: Cigarettes    Passive exposure: Current   Smokeless tobacco: Never  Vaping Use   Vaping Use: Never used  Substance and Sexual Activity   Alcohol use: Yes    Alcohol/week: 2.0 - 3.0 standard drinks of alcohol    Types: 2 - 3 Shots of liquor per week    Comment: daily liquor moderatly   Drug use: Not on file    Comment: uses every other day   Sexual activity: Not Currently  Other Topics Concern   Not on file  Social History Narrative   Not on file   Social Determinants of Health   Financial Resource Strain: High Risk (10/14/2021)   Overall Financial Resource Strain (CARDIA)    Difficulty of Paying Living Expenses: Hard  Food Insecurity: Not on file  Transportation Needs: Not on file  Physical Activity: Not on file  Stress: Not on file  Social Connections: Not on file  Intimate Partner Violence: Not on file    FAMILY HISTORY:  Family History  Problem Relation Age of Onset   Heart disease Father    Colon polyps Sister        less than 10 lifetime polyps   Colon cancer Maternal Grandmother        dx 90s    CURRENT MEDICATIONS:  Current Outpatient Medications  Medication Sig Dispense Refill   albuterol (VENTOLIN HFA) 108 (90 Base) MCG/ACT inhaler Inhale 2 puffs into the lungs every 6 (six) hours as needed for wheezing or shortness of breath. 8 g 2   amLODipine (NORVASC) 2.5 MG tablet Take 1 tablet (2.5 mg total) by mouth daily. 30 tablet 1   aspirin EC 81 MG EC tablet Take 1 tablet (81 mg  total) by mouth daily at 6 (six) AM. Swallow whole. 30 tablet 11   bisacodyl (DULCOLAX) 5 MG EC tablet Take 5 mg by mouth daily as needed for moderate constipation.     carvedilol (COREG) 6.25 MG tablet Take 1 tablet (6.25 mg total) by mouth 2 (two) times daily with a meal. 60 tablet 3   famotidine (PEPCID) 20 MG tablet Take 20 mg by mouth daily as needed for heartburn or indigestion.     feeding supplement (ENSURE ENLIVE / ENSURE PLUS) LIQD Take 237 mLs by mouth 3 (three) times daily between meals. (Patient taking differently: Take 237 mLs by mouth 3 (three) times daily as needed (nutrition).) 237 mL 12   fluorouracil CALGB 49201 2,400 mg/m2 in sodium chloride 0.9 % 150 mL Inject 2,400 mg/m2 into the vein over 48 hr. Every  14 days     FLUOROURACIL IV Inject into the vein every 14 (fourteen) days.     LEUCOVORIN CALCIUM IV Inject into the vein every 14 (fourteen) days.     lidocaine-prilocaine (EMLA) cream Apply a small amount to port a cath site (do not rub in) and cover with plastic wrap 1 hour prior to infusion appointment 30 g 3   loperamide (IMODIUM A-D) 2 MG tablet Take 1 tablet (2 mg total) by mouth 4 (four) times daily as needed for diarrhea or loose stools.  0   Multiple Vitamins-Minerals (MULTIVITAMIN WITH MINERALS) tablet Take 1 tablet by mouth daily. Centrum Silver for Men 50+     nicotine (NICODERM CQ - DOSED IN MG/24 HOURS) 21 mg/24hr patch Place 21 mg onto the skin daily.     OXALIPLATIN IV Inject into the vein every 14 (fourteen) days.     prochlorperazine (COMPAZINE) 10 MG tablet Take 1 tablet (10 mg total) by mouth every 6 (six) hours as needed (Nausea or vomiting). 60 tablet 6   simvastatin (ZOCOR) 20 MG tablet Take 20 mg by mouth daily.     No current facility-administered medications for this visit.   Facility-Administered Medications Ordered in Other Visits  Medication Dose Route Frequency Provider Last Rate Last Admin   magnesium sulfate 2 GM/50ML IVPB             palonosetron (ALOXI) 0.25 MG/5ML injection             ALLERGIES:  Allergies  Allergen Reactions   Lisinopril Anaphylaxis   Naproxen Shortness Of Breath and Palpitations   Nsaids Shortness Of Breath and Palpitations    PHYSICAL EXAM:  Performance status (ECOG): 1 - Symptomatic but completely ambulatory  Vitals:   10/19/21 0904  BP: (!) 143/79  Pulse: 67  Resp: 18  Temp: 98.4 F (36.9 C)  SpO2: 100%    Wt Readings from Last 3 Encounters:  10/19/21 167 lb (75.8 kg)  10/05/21 170 lb (77.1 kg)  10/01/21 167 lb 9.6 oz (76 kg)   Physical Exam Vitals reviewed.  Constitutional:      Appearance: Normal appearance.  Cardiovascular:     Rate and Rhythm: Normal rate and regular rhythm.     Pulses: Normal pulses.     Heart sounds: Normal heart sounds.  Pulmonary:     Effort: Pulmonary effort is normal.     Breath sounds: Normal breath sounds.  Neurological:     General: No focal deficit present.     Mental Status: He is alert and oriented to person, place, and time.  Psychiatric:        Mood and Affect: Mood normal.        Behavior: Behavior normal.      LABORATORY DATA:  I have reviewed the labs as listed.     Latest Ref Rng & Units 10/19/2021    9:01 AM 10/05/2021    8:31 AM 09/16/2021    1:36 PM  CBC  WBC 4.0 - 10.5 K/uL 5.3  6.1  6.5   Hemoglobin 13.0 - 17.0 g/dL 11.7  11.3  11.9   Hematocrit 39.0 - 52.0 % 35.3  34.4  37.0   Platelets 150 - 400 K/uL 181  164  251       Latest Ref Rng & Units 10/19/2021    9:01 AM 10/05/2021    8:31 AM 09/16/2021    1:36 PM  CMP  Glucose 70 - 99 mg/dL 96  119  101   BUN 8 - 23 mg/dL 22  22  42   Creatinine 0.61 - 1.24 mg/dL 1.51  1.78  2.12   Sodium 135 - 145 mmol/L 139  139  137   Potassium 3.5 - 5.1 mmol/L 4.5  4.6  5.1   Chloride 98 - 111 mmol/L 113  113  112   CO2 22 - 32 mmol/L _0 Calcium 8.9 - 10.3 mg/dL 8.9  9.1  9.3   Total Protein 6.5 - 8.1 g/dL 7.5  7.4  8.0   Total Bilirubin 0.3 - 1.2 mg/dL 0.5  0.8   0.4   Alkaline Phos 38 - 126 U/L 109  101  113   AST 15 - 41 U/L _1 ALT 0 - 44 U/L 19  17  36     DIAGNOSTIC IMAGING:  I have independently reviewed the scans and discussed with the patient. IR IMAGING GUIDED PORT INSERTION  Result Date: 09/29/2021 CLINICAL DATA:  Colon cancer, ACCESS FOR CHEMOTHERAPY EXAM: RIGHT INTERNAL JUGULAR SINGLE LUMEN POWER PORT CATHETER INSERTION Date:  09/29/2021 09/29/2021 10:45 am Radiologist:  M. Daryll Brod, MD Guidance:  Ultrasound and fluoroscopic MEDICATIONS: 1% lidocaine local ANESTHESIA/SEDATION: Versed 1.0 mg IV; Fentanyl 25 mcg IV; Moderate Sedation Time:  22 minutes The patient was continuously monitored during the procedure by the interventional radiology nurse under my direct supervision. FLUOROSCOPY: 0 minutes, 42 seconds (3 mGy) COMPLICATIONS: None immediate. CONTRAST:  None PROCEDURE: Informed consent was obtained from the patient following explanation of the procedure, risks, benefits and alternatives. The patient understands, agrees and consents for the procedure. All questions were addressed. A time out was performed. Maximal barrier sterile technique utilized including caps, mask, sterile gowns, sterile gloves, large sterile drape, hand hygiene, and 2% chlorhexidine scrub. Under sterile conditions and local anesthesia, right internal jugular micropuncture venous access was performed. Access was performed with ultrasound. Images were obtained for documentation of the patent right internal jugular vein. A guide wire was inserted followed by a transitional dilator. This allowed insertion of a guide wire and catheter into the IVC. Measurements were obtained from the SVC / RA junction back to the right IJ venotomy site. In the right infraclavicular chest, a subcutaneous pocket was created over the second anterior rib. This was done under sterile conditions and local anesthesia. 1% lidocaine with epinephrine was utilized for this. A 2.5 cm incision was made  in the skin. Blunt dissection was performed to create a subcutaneous pocket over the right pectoralis major muscle. The pocket was flushed with saline vigorously. There was adequate hemostasis. The port catheter was assembled and checked for leakage. The port catheter was secured in the pocket with two retention sutures. The tubing was tunneled subcutaneously to the right venotomy site and inserted into the SVC/RA junction through a valved peel-away sheath. Position was confirmed with fluoroscopy. Images were obtained for documentation. The patient tolerated the procedure well. No immediate complications. Incisions were closed in a two layer fashion with 4 - 0 Vicryl suture. Dermabond was applied to the skin. The port catheter was accessed, blood was aspirated followed by saline and heparin flushes. Needle was removed. A dry sterile dressing was applied. IMPRESSION: Ultrasound and fluoroscopically guided right internal jugular single lumen power port catheter insertion. Tip in the SVC/RA junction. Catheter ready for use. Electronically Signed   By: Jerilynn Mages.  Shick M.D.   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 - Colonoscopy on 03/12/2021: Fungating partially obstructing large mass found at 40 cm proximal to the anus, circumferential with no bleeding present. - CEA on 03/12/2021: 8.3 - CT angio abdomen and pelvis on 04/08/2021: No evidence of metastatic lymphadenopathy or liver mets.  Stable 7 mm subpleural pulmonary nodule in the periphery of the right lower lobe. - CT angio CAP on 02/04/2021: No evidence of lung metastasis, abdominal metastasis. - Resection of splenic flexure mass on 04/23/2021 by Dr. Marcello Moores. - Pathology: Moderately differentiated adenocarcinoma, invades into subserosa, margins negative, 0/38 lymph nodes.  No LVI or perineural invasion.  PT3 pN0.  MSI-stable. - Guardant reveal (05/21/2021): CT DNA was  detected. - PET scan on 06/10/2021 shows 2 cm hypermetabolic soft tissue density in the transverse colon.  No evidence of local or distant metastatic disease.    Social/family history: - He is a retired [employer]in [employer]. - Current active smoker, half pack per day for 50 years. - Mother had colon cancer at age 85.  Maternal grandmother had colon cancer.  3.  Stage III (Y1VC9) right colon adenocarcinoma: - Right colectomy on 08/10/2021 by Dr. Marcello Moores. - Pathology with grade 2 moderately differentiated colonic adenocarcinoma, tumor site ascending colon, margins negative, 1/22 lymph nodes involved, [id number].  MMR preserved.        -Cycle 1 of FOLFOX started on 10/05/2021   PLAN:  Stage III (T4AN1) right colon adenocarcinoma, MMR preserved: -He has tolerated first cycle of dose reduced FOLFOX reasonably well. - Reviewed labs today which showed normal LFTs.  Creatinine is better at 1.51.  CBC was grossly normal.  Last CEA was 5.7. - Proceed with cycle 2 with 80% dose today and cycle 3 in 2 weeks.  RTC 4 weeks for follow-up with repeat labs.  2.  Normocytic anemia: - Combination anemia from CKD and relative iron deficiency. - Hemoglobin today is 11.7.  Will consider checking ferritin and iron panel if hemoglobin drops below 10.  3.  Chemotherapy-induced diarrhea: - Use Imodium as needed.   Orders placed this encounter:  No orders of the defined types were placed in this encounter.    Derek Jack, MD [place] [phone]

## 2021-10-19 NOTE — Progress Notes (Signed)
Patient has been examined by Dr. Delton Coombes, and vital signs and labs have been reviewed. ANC, Creatinine, LFTs, hemoglobin, and platelets are within treatment parameters per M.D. - pt may proceed with treatment with chemotherapy remaining at 80% dose. Primary RN and pharmacy notified.

## 2021-10-19 NOTE — Progress Notes (Signed)
Patient presents today for treatment C2 Folfox and follow up visit with Dr. Delton Coombes. Labs pending. Vital signs within parameters for treatment. Patient has no complaints today and states, " I feel pretty good." After C1 patient states he experienced diarrhea x 1 and used Imodium and it resolved.   Verbal message received from A. Ouida Sills RN / Dr. Delton Coombes proceed with treatment.   Treatment given today per MD orders. Tolerated infusion without adverse affects. Vital signs stable. No complaints at this time. 5FU pump infusing and RUN noted on the screen and verified with patient. Discharged from clinic ambulatory in stable condition. Alert and oriented x 3. F/U with [facility] as scheduled.

## 2021-10-21 ENCOUNTER — Inpatient Hospital Stay: Payer: Medicare Other

## 2021-10-21 VITALS — BP 134/78 | HR 67 | Temp 97.6°F | Resp 18

## 2021-10-21 DIAGNOSIS — Z95828 Presence of other vascular implants and grafts: Secondary | ICD-10-CM

## 2021-10-21 DIAGNOSIS — C183 Malignant neoplasm of hepatic flexure: Secondary | ICD-10-CM | POA: Diagnosis not present

## 2021-10-21 DIAGNOSIS — Z9049 Acquired absence of other specified parts of digestive tract: Secondary | ICD-10-CM | POA: Diagnosis not present

## 2021-10-21 DIAGNOSIS — Z800 Family history of malignant neoplasm of digestive organs: Secondary | ICD-10-CM | POA: Diagnosis not present

## 2021-10-21 DIAGNOSIS — Z5111 Encounter for antineoplastic chemotherapy: Secondary | ICD-10-CM | POA: Diagnosis not present

## 2021-10-21 DIAGNOSIS — F17210 Nicotine dependence, cigarettes, uncomplicated: Secondary | ICD-10-CM | POA: Diagnosis not present

## 2021-10-21 DIAGNOSIS — D649 Anemia, unspecified: Secondary | ICD-10-CM | POA: Diagnosis not present

## 2021-10-21 MED ORDER — HEPARIN SOD (PORK) LOCK FLUSH 100 UNIT/ML IV SOLN
500.0000 [IU] | Freq: Once | INTRAVENOUS | Status: AC | PRN
  Administered 2021-10-21: 500 [IU]

## 2021-10-21 MED ORDER — SODIUM CHLORIDE 0.9% FLUSH
10.0000 mL | INTRAVENOUS | Status: DC | PRN
  Administered 2021-10-21: 10 mL

## 2021-10-21 NOTE — Patient Instructions (Signed)
[facility]  Discharge Instructions: Thank you for choosing [facility] to provide your oncology and hematology care.  If you have a lab appointment with the Cancer Center, please come in thru the Main Entrance and check in at the main information desk.  Wear comfortable clothing and clothing appropriate for easy access to any Portacath or PICC line.   We strive to give you quality time with your provider. You may need to reschedule your appointment if you arrive late (15 or more minutes).  Arriving late affects you and other patients whose appointments are after yours.  Also, if you miss three or more appointments without notifying the office, you may be dismissed from the clinic at the provider's discretion.      For prescription refill requests, have your pharmacy contact our office and allow 72 hours for refills to be completed.     To help prevent nausea and vomiting after your treatment, we encourage you to take your nausea medication as directed.  BELOW ARE SYMPTOMS THAT SHOULD BE REPORTED IMMEDIATELY: *FEVER GREATER THAN 100.4 F (38 C) OR HIGHER *CHILLS OR SWEATING *NAUSEA AND VOMITING THAT IS NOT CONTROLLED WITH YOUR NAUSEA MEDICATION *UNUSUAL SHORTNESS OF BREATH *UNUSUAL BRUISING OR BLEEDING *URINARY PROBLEMS (pain or burning when urinating, or frequent urination) *BOWEL PROBLEMS (unusual diarrhea, constipation, pain near the anus) TENDERNESS IN MOUTH AND THROAT WITH OR WITHOUT PRESENCE OF ULCERS (sore throat, sores in mouth, or a toothache) UNUSUAL RASH, SWELLING OR PAIN  UNUSUAL VAGINAL DISCHARGE OR ITCHING   Items with * indicate a potential emergency and should be followed up as soon as possible or go to the Emergency Department if any problems should occur.  Please show the CHEMOTHERAPY ALERT CARD or IMMUNOTHERAPY ALERT CARD at check-in to the Emergency Department and triage nurse.  Should you have questions after your visit or need to  cancel or reschedule your appointment, please contact [facility] [phone]  and follow the prompts.  Office hours are 8:00 a.m. to 4:30 p.m. Monday - Friday. Please note that voicemails left after 4:00 p.m. may not be returned until the following business day.  We are closed weekends and major holidays. You have access to a nurse at all times for urgent questions. Please call the main number to the clinic [phone] and follow the prompts.  For any non-urgent questions, you may also contact your provider using MyChart. We now offer e-Visits for anyone 18 and older to request care online for non-urgent symptoms. For details visit [email or web address].   Also download the MyChart app! Go to the app store, search "MyChart", open the app, select [facility], and log in with your MyChart username and password.  Masks are optional in the cancer centers. If you would like for your care team to wear a mask while they are taking care of you, please let them know. You may have one support person who is at least 71 years old accompany you for your appointments.  

## 2021-10-21 NOTE — Progress Notes (Signed)
Chemotherapy pump disconnected with no complaints voiced.  Patients port flushed without difficulty.  Good blood return noted with no bruising or swelling noted at site.  Band aid applied.  VSS with discharge and left in satisfactory condition with no s/s of distress noted.    

## 2021-10-24 ENCOUNTER — Other Ambulatory Visit: Payer: Self-pay | Admitting: Hematology

## 2021-10-24 DIAGNOSIS — Z95828 Presence of other vascular implants and grafts: Secondary | ICD-10-CM

## 2021-10-24 DIAGNOSIS — C183 Malignant neoplasm of hepatic flexure: Secondary | ICD-10-CM

## 2021-11-02 ENCOUNTER — Inpatient Hospital Stay: Payer: Medicare Other

## 2021-11-02 ENCOUNTER — Inpatient Hospital Stay: Payer: Medicare Other | Attending: Hematology

## 2021-11-02 DIAGNOSIS — C186 Malignant neoplasm of descending colon: Secondary | ICD-10-CM | POA: Insufficient documentation

## 2021-11-02 DIAGNOSIS — Z5111 Encounter for antineoplastic chemotherapy: Secondary | ICD-10-CM | POA: Diagnosis not present

## 2021-11-02 DIAGNOSIS — R197 Diarrhea, unspecified: Secondary | ICD-10-CM | POA: Diagnosis not present

## 2021-11-02 DIAGNOSIS — C183 Malignant neoplasm of hepatic flexure: Secondary | ICD-10-CM

## 2021-11-02 DIAGNOSIS — R7989 Other specified abnormal findings of blood chemistry: Secondary | ICD-10-CM

## 2021-11-02 DIAGNOSIS — D649 Anemia, unspecified: Secondary | ICD-10-CM | POA: Insufficient documentation

## 2021-11-02 DIAGNOSIS — N189 Chronic kidney disease, unspecified: Secondary | ICD-10-CM | POA: Insufficient documentation

## 2021-11-02 DIAGNOSIS — E860 Dehydration: Secondary | ICD-10-CM

## 2021-11-02 DIAGNOSIS — F17210 Nicotine dependence, cigarettes, uncomplicated: Secondary | ICD-10-CM | POA: Diagnosis not present

## 2021-11-02 DIAGNOSIS — I129 Hypertensive chronic kidney disease with stage 1 through stage 4 chronic kidney disease, or unspecified chronic kidney disease: Secondary | ICD-10-CM | POA: Diagnosis not present

## 2021-11-02 DIAGNOSIS — Z95828 Presence of other vascular implants and grafts: Secondary | ICD-10-CM

## 2021-11-02 LAB — COMPREHENSIVE METABOLIC PANEL
ALT: 29 U/L (ref 0–44)
AST: 43 U/L — ABNORMAL HIGH (ref 15–41)
Albumin: 3.8 g/dL (ref 3.5–5.0)
Alkaline Phosphatase: 128 U/L — ABNORMAL HIGH (ref 38–126)
Anion gap: 8 (ref 5–15)
BUN: 47 mg/dL — ABNORMAL HIGH (ref 8–23)
CO2: 21 mmol/L — ABNORMAL LOW (ref 22–32)
Calcium: 9.4 mg/dL (ref 8.9–10.3)
Chloride: 109 mmol/L (ref 98–111)
Creatinine, Ser: 2.32 mg/dL — ABNORMAL HIGH (ref 0.61–1.24)
GFR, Estimated: 29 mL/min — ABNORMAL LOW (ref >60)
Glucose, Bld: 129 mg/dL — ABNORMAL HIGH (ref 70–99)
Potassium: 5 mmol/L (ref 3.5–5.1)
Sodium: 138 mmol/L (ref 135–145)
Total Bilirubin: 0.6 mg/dL (ref 0.3–1.2)
Total Protein: 8 g/dL (ref 6.5–8.1)

## 2021-11-02 LAB — CBC WITH DIFFERENTIAL/PLATELET
Abs Immature Granulocytes: 0.01 10*3/uL (ref 0.00–0.07)
Basophils Absolute: 0 10*3/uL (ref 0.0–0.1)
Basophils Relative: 1 %
Eosinophils Absolute: 0.1 10*3/uL (ref 0.0–0.5)
Eosinophils Relative: 2 %
HCT: 38.9 % — ABNORMAL LOW (ref 39.0–52.0)
Hemoglobin: 13 g/dL (ref 13.0–17.0)
Immature Granulocytes: 0 %
Lymphocytes Relative: 34 %
Lymphs Abs: 1.9 10*3/uL (ref 0.7–4.0)
MCH: 32.3 pg (ref 26.0–34.0)
MCHC: 33.4 g/dL (ref 30.0–36.0)
MCV: 96.8 fL (ref 80.0–100.0)
Monocytes Absolute: 1 10*3/uL (ref 0.1–1.0)
Monocytes Relative: 18 %
Neutro Abs: 2.5 10*3/uL (ref 1.7–7.7)
Neutrophils Relative %: 45 %
Platelets: 147 10*3/uL — ABNORMAL LOW (ref 150–400)
RBC: 4.02 MIL/uL — ABNORMAL LOW (ref 4.22–5.81)
RDW: 15.9 % — ABNORMAL HIGH (ref 11.5–15.5)
WBC: 5.6 10*3/uL (ref 4.0–10.5)
nRBC: 0 % (ref 0.0–0.2)

## 2021-11-02 LAB — MAGNESIUM: Magnesium: 2.2 mg/dL (ref 1.7–2.4)

## 2021-11-02 MED ORDER — SODIUM CHLORIDE 0.9 % IV SOLN: Freq: Once | INTRAVENOUS | Status: AC

## 2021-11-02 MED ORDER — SODIUM CHLORIDE 0.9% FLUSH
10.0000 mL | INTRAVENOUS | Status: DC | PRN
  Administered 2021-11-02: 10 mL via INTRAVENOUS

## 2021-11-02 MED ORDER — HEPARIN SOD (PORK) LOCK FLUSH 100 UNIT/ML IV SOLN
500.0000 [IU] | Freq: Once | INTRAVENOUS | Status: AC
  Administered 2021-11-02: 500 [IU] via INTRAVENOUS

## 2021-11-02 NOTE — Progress Notes (Signed)
Patient presents today for chemotherapy treatment.  Patient is in satisfactory condition with no complaints voiced.  Vital signs are stable.  Labs reviewed.  Creatinine today is 2.32.  NP made aware.  All other labs are within treatment parameters.   Per Burns Spain, NP we will hold treatment today due to elevated creatinine and give NS 1 L over 2 hours today.  Patient tolerated fluids well with no complaints voiced.  Patient left ambulatory in stable condition.  Vital signs stable at discharge.  Follow up as scheduled.

## 2021-11-02 NOTE — Progress Notes (Signed)
Patients port flushed without difficulty.  Good blood return noted with no bruising or swelling noted at site.  Stable during access and blood draw.  Patient to remain accessed for treatment. 

## 2021-11-02 NOTE — Patient Instructions (Signed)
[facility]  Discharge Instructions: Thank you for choosing [facility] to provide your oncology and hematology care.  If you have a lab appointment with the Cancer Center, please come in thru the Main Entrance and check in at the main information desk.  Wear comfortable clothing and clothing appropriate for easy access to any Portacath or PICC line.   We strive to give you quality time with your provider. You may need to reschedule your appointment if you arrive late (15 or more minutes).  Arriving late affects you and other patients whose appointments are after yours.  Also, if you miss three or more appointments without notifying the office, you may be dismissed from the clinic at the provider's discretion.      For prescription refill requests, have your pharmacy contact our office and allow 72 hours for refills to be completed.     To help prevent nausea and vomiting after your treatment, we encourage you to take your nausea medication as directed.  BELOW ARE SYMPTOMS THAT SHOULD BE REPORTED IMMEDIATELY: *FEVER GREATER THAN 100.4 F (38 C) OR HIGHER *CHILLS OR SWEATING *NAUSEA AND VOMITING THAT IS NOT CONTROLLED WITH YOUR NAUSEA MEDICATION *UNUSUAL SHORTNESS OF BREATH *UNUSUAL BRUISING OR BLEEDING *URINARY PROBLEMS (pain or burning when urinating, or frequent urination) *BOWEL PROBLEMS (unusual diarrhea, constipation, pain near the anus) TENDERNESS IN MOUTH AND THROAT WITH OR WITHOUT PRESENCE OF ULCERS (sore throat, sores in mouth, or a toothache) UNUSUAL RASH, SWELLING OR PAIN  UNUSUAL VAGINAL DISCHARGE OR ITCHING   Items with * indicate a potential emergency and should be followed up as soon as possible or go to the Emergency Department if any problems should occur.  Please show the CHEMOTHERAPY ALERT CARD or IMMUNOTHERAPY ALERT CARD at check-in to the Emergency Department and triage nurse.  Should you have questions after your visit or need to  cancel or reschedule your appointment, please contact [facility] [phone]  and follow the prompts.  Office hours are 8:00 a.m. to 4:30 p.m. Monday - Friday. Please note that voicemails left after 4:00 p.m. may not be returned until the following business day.  We are closed weekends and major holidays. You have access to a nurse at all times for urgent questions. Please call the main number to the clinic [phone] and follow the prompts.  For any non-urgent questions, you may also contact your provider using MyChart. We now offer e-Visits for anyone 18 and older to request care online for non-urgent symptoms. For details visit [email or web address].   Also download the MyChart app! Go to the app store, search "MyChart", open the app, select [place], and log in with your MyChart username and password.  Masks are optional in the cancer centers. If you would like for your care team to wear a mask while they are taking care of you, please let them know. You may have one support person who is at least 71 years old accompany you for your appointments.  

## 2021-11-03 LAB — CEA: CEA: 5.2 ng/mL — ABNORMAL HIGH (ref 0.0–4.7)

## 2021-11-04 ENCOUNTER — Inpatient Hospital Stay: Payer: Medicare Other

## 2021-11-04 DIAGNOSIS — Z95828 Presence of other vascular implants and grafts: Secondary | ICD-10-CM

## 2021-11-04 DIAGNOSIS — C183 Malignant neoplasm of hepatic flexure: Secondary | ICD-10-CM

## 2021-11-05 DIAGNOSIS — C183 Malignant neoplasm of hepatic flexure: Secondary | ICD-10-CM | POA: Diagnosis not present

## 2021-11-08 ENCOUNTER — Ambulatory Visit (INDEPENDENT_AMBULATORY_CARE_PROVIDER_SITE_OTHER): Payer: Medicare Other

## 2021-11-08 DIAGNOSIS — I442 Atrioventricular block, complete: Secondary | ICD-10-CM | POA: Diagnosis not present

## 2021-11-09 ENCOUNTER — Ambulatory Visit (HOSPITAL_COMMUNITY)
Admission: RE | Admit: 2021-11-09 | Discharge: 2021-11-09 | Disposition: A | Discharge status: Home / Self Care | Payer: Medicare Other | Source: Ambulatory Visit | Attending: Vascular Surgery | Admitting: Vascular Surgery

## 2021-11-09 ENCOUNTER — Ambulatory Visit: Payer: Medicare Other | Admitting: Vascular Surgery

## 2021-11-09 ENCOUNTER — Encounter: Payer: Self-pay | Admitting: Vascular Surgery

## 2021-11-09 VITALS — BP 138/87 | HR 61 | Temp 97.4°F | Resp 16 | Ht 72.0 in | Wt 166.0 lb

## 2021-11-09 DIAGNOSIS — I7143 Infrarenal abdominal aortic aneurysm, without rupture: Secondary | ICD-10-CM

## 2021-11-09 NOTE — Progress Notes (Signed)
Patient name: Fernando Dean MRN: [id number] DOB: 1950/12/06 Sex: male  REASON FOR VISIT: 6 month follow-up after EVAR  HPI: Fernando Dean is a 71 y.o. male that underwent EVAR on 02/26/2021 for 5.8 cm AAA.  He presents for 6 month follow-up.  CTA was previously obtained on 04/09/2021 with successful endovascular stent graft exclusion of the aneurysm and no endoleak.  No significant abdominal or back pain.  Groins healed without issue.  No complaints today  Legs are doing fine.  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 - [employer] MRI 2272 dual chamber PPM   Tuberculosis    as a child, was treated    Past Surgical History:  Procedure Laterality Date   ABDOMINAL AORTIC ENDOVASCULAR STENT GRAFT Bilateral 02/26/2021   Procedure: ABDOMINAL AORTIC ENDOVASCULAR STENT GRAFT REPAIR;  Surgeon: Marty Heck, MD;  Location: [facility] OR;  Service: Vascular;  Laterality: Bilateral;   BIOPSY  03/12/2021   Procedure: BIOPSY;  Surgeon: Harvel Quale, MD;  [facility];  Service: Gastroenterology;;   BIOPSY  06/25/2021   Procedure: BIOPSY;  Surgeon: Harvel Quale, MD;  [facility];  Service: Gastroenterology;;  mass    COLONOSCOPY WITH PROPOFOL N/A 03/12/2021   Procedure: COLONOSCOPY WITH PROPOFOL;  Surgeon: Harvel Quale, MD;  [facility];  Service: Gastroenterology;  Laterality: N/A;  10:55   COLONOSCOPY WITH PROPOFOL N/A 06/25/2021   Procedure: COLONOSCOPY WITH PROPOFOL;  Surgeon: Harvel Quale, MD;  [facility];  Service: Gastroenterology;  Laterality: N/A;  235 ASA 2   HEMOSTASIS CLIP PLACEMENT   03/12/2021   Procedure: HEMOSTASIS CLIP PLACEMENT;  Surgeon: Harvel Quale, MD;  [facility];  Service: Gastroenterology;;   HEMOSTASIS CLIP PLACEMENT  06/25/2021   Procedure: HEMOSTASIS CLIP PLACEMENT;  Surgeon: Harvel Quale, MD;  [facility];  Service: Gastroenterology;;   HERNIA REPAIR Left 10/25/2006   IR IMAGING GUIDED PORT INSERTION  09/29/2021   PACEMAKER IMPLANT N/A 02/08/2021   Procedure: PACEMAKER IMPLANT;  Surgeon: Deboraha Sprang, MD;  Location: [facility];  Service: Cardiovascular;  Laterality: N/A;   POLYPECTOMY  03/12/2021   Procedure: POLYPECTOMY;  Surgeon: Harvel Quale, MD;  [facility];  Service: Gastroenterology;;   POLYPECTOMY  06/25/2021   Procedure: POLYPECTOMY;  Surgeon: Harvel Quale, MD;  [facility];  Service: Gastroenterology;;   [name]  03/12/2021   Procedure: [name];  Surgeon: [name], [name], MD;  [facility];  Service: Gastroenterology;;    Family History  Problem Relation Age of Onset   Heart disease Father    Colon polyps Sister        less than 10 lifetime polyps   Colon cancer Maternal Grandmother        dx 90s    SOCIAL HISTORY: Social History   Tobacco Use   Smoking status: Every Day    Packs/day: 0.75    Years: 50.00    Total pack years: 37.50    Types: Cigarettes    Passive exposure: Current  Smokeless tobacco: Never  Substance Use Topics   Alcohol use: Yes    Alcohol/week: 2.0 - 3.0 standard drinks of alcohol    Types: 2 - 3 Shots of liquor per week    Comment: daily liquor moderatly    Allergies  Allergen Reactions   Lisinopril Anaphylaxis   Naproxen Shortness Of Breath and Palpitations   Nsaids Shortness Of Breath and Palpitations    Current Outpatient Medications  Medication Sig Dispense Refill   albuterol (VENTOLIN HFA) 108 (90 Base) MCG/ACT inhaler Inhale 2 puffs into the lungs every 6  (six) hours as needed for wheezing or shortness of breath. 8 g 2   amLODipine (NORVASC) 2.5 MG tablet Take 1 tablet (2.5 mg total) by mouth daily. 30 tablet 1   aspirin EC 81 MG EC tablet Take 1 tablet (81 mg total) by mouth daily at 6 (six) AM. Swallow whole. 30 tablet 11   bisacodyl (DULCOLAX) 5 MG EC tablet Take 5 mg by mouth daily as needed for moderate constipation.     carvedilol (COREG) 6.25 MG tablet Take 1 tablet (6.25 mg total) by mouth 2 (two) times daily with a meal. 60 tablet 3   famotidine (PEPCID) 20 MG tablet Take 20 mg by mouth daily as needed for heartburn or indigestion.     feeding supplement (ENSURE ENLIVE / ENSURE PLUS) LIQD Take 237 mLs by mouth 3 (three) times daily between meals. (Patient taking differently: Take 237 mLs by mouth 3 (three) times daily as needed (nutrition).) 237 mL 12   fluorouracil CALGB 57017 2,400 mg/m2 in sodium chloride 0.9 % 150 mL Inject 2,400 mg/m2 into the vein over 48 hr. Every 14 days     FLUOROURACIL IV Inject into the vein every 14 (fourteen) days.     LEUCOVORIN CALCIUM IV Inject into the vein every 14 (fourteen) days.     loperamide (IMODIUM A-D) 2 MG tablet Take 1 tablet (2 mg total) by mouth 4 (four) times daily as needed for diarrhea or loose stools.  0   Multiple Vitamins-Minerals (MULTIVITAMIN WITH MINERALS) tablet Take 1 tablet by mouth daily. Centrum Silver for Men 50+     nicotine (NICODERM CQ - DOSED IN MG/24 HOURS) 21 mg/24hr patch Place 21 mg onto the skin daily.     OXALIPLATIN IV Inject into the vein every 14 (fourteen) days.     simvastatin (ZOCOR) 20 MG tablet Take 20 mg by mouth daily.     No current facility-administered medications for this visit.   Facility-Administered Medications Ordered in Other Visits  Medication Dose Route Frequency Provider Last Rate Last Admin   magnesium sulfate 2 GM/50ML IVPB            palonosetron (ALOXI) 0.25 MG/5ML injection             REVIEW OF SYSTEMS:  '[X]'$  denotes positive finding, [  ] denotes negative finding Cardiac  Comments:  Chest pain or chest pressure:    Shortness of breath upon exertion:    Short of breath when lying flat:    Irregular heart rhythm:        Vascular    Pain in calf, thigh, or hip brought on by ambulation:    Pain in feet at night that wakes you up from your sleep:     Blood clot in your veins:    Leg swelling:         Pulmonary    Oxygen at home:    Productive cough:  Wheezing:         Neurologic    Sudden weakness in arms or legs:     Sudden numbness in arms or legs:     Sudden onset of difficulty speaking or slurred speech:    Temporary loss of vision in one eye:     Problems with dizziness:         Gastrointestinal    Blood in stool:     Vomited blood:         Genitourinary    Burning when urinating:     Blood in urine:        Psychiatric    Major depression:         Hematologic    Bleeding problems:    Problems with blood clotting too easily:        Skin    Rashes or ulcers:        Constitutional    Fever or chills:      PHYSICAL EXAM: Vitals:   11/09/21 0808  BP: 138/87  Pulse: 61  Resp: 16  Temp: (!) 97.4 F (36.3 C)  TempSrc: Temporal  SpO2: 95%  Weight: 166 lb (75.3 kg)  Height: 6' (1.829 m)    GENERAL: The patient is a well-nourished male, in no acute distress. The vital signs are documented above. CARDIAC: There is a regular rate and rhythm.  VASCULAR:  Palpable femoral pulses bilaterally Palpable PT pulses bilateral PULMONARY: No respiratory distress. ABDOMEN: Soft and non-tender.  DATA:   EVAR duplex reviewed today with no endoleak.  Maximal aortic diameter is 5 cm.  Assessment/Plan:  71 year old male status post EVAR for a 5.8 cm abdominal aortic aneurysm on 02/26/2021.  Patient presents for 34-monthfollow-up.  EVAR duplex today shows no endoleak with an aneurysm sac that continues to decrease in size as expected.  Maximal aortic diameter is now 5 cm.  Discussed follow-up in 1 year  with [facility] for EVAR duplex for continued surveillance.  Discussed the importance of smoking cessation.   CMarty Heck MD [facility]Office: [phone]

## 2021-11-10 LAB — CUP PACEART REMOTE DEVICE CHECK
Battery Remaining Longevity: 119 mo
Battery Remaining Percentage: 95.5 %
Battery Voltage: 3.01 V
Brady Statistic AP VP Percent: 12 %
Brady Statistic AP VS Percent: 1 %
Brady Statistic AS VP Percent: 87 %
Brady Statistic AS VS Percent: 1 %
Brady Statistic RA Percent Paced: 11 %
Brady Statistic RV Percent Paced: 99 %
Date Time Interrogation Session: 20230918020013
Implantable Lead Implant Date: 20221219
Implantable Lead Implant Date: 20221219
Implantable Lead Location: 753859
Implantable Lead Location: 753860
Implantable Pulse Generator Implant Date: 20221219
Lead Channel Impedance Value: 440 Ohm
Lead Channel Impedance Value: 490 Ohm
Lead Channel Pacing Threshold Amplitude: 0.5 V
Lead Channel Pacing Threshold Amplitude: 0.75 V
Lead Channel Pacing Threshold Pulse Width: 0.5 ms
Lead Channel Pacing Threshold Pulse Width: 0.5 ms
Lead Channel Sensing Intrinsic Amplitude: 10.9 mV
Lead Channel Sensing Intrinsic Amplitude: 3.1 mV
Lead Channel Setting Pacing Amplitude: 1 V
Lead Channel Setting Pacing Amplitude: 1.5 V
Lead Channel Setting Pacing Pulse Width: 0.5 ms
Lead Channel Setting Sensing Sensitivity: 2 mV
Pulse Gen Model: 2272
Pulse Gen Serial Number: 3985814

## 2021-11-16 ENCOUNTER — Inpatient Hospital Stay: Payer: Medicare Other

## 2021-11-16 ENCOUNTER — Other Ambulatory Visit: Payer: Self-pay | Admitting: *Deleted

## 2021-11-16 ENCOUNTER — Inpatient Hospital Stay (HOSPITAL_BASED_OUTPATIENT_CLINIC_OR_DEPARTMENT_OTHER): Payer: Medicare Other | Admitting: Hematology

## 2021-11-16 VITALS — BP 156/85 | HR 82 | Temp 98.4°F | Resp 18

## 2021-11-16 DIAGNOSIS — R197 Diarrhea, unspecified: Secondary | ICD-10-CM | POA: Diagnosis not present

## 2021-11-16 DIAGNOSIS — C183 Malignant neoplasm of hepatic flexure: Secondary | ICD-10-CM

## 2021-11-16 DIAGNOSIS — Z95828 Presence of other vascular implants and grafts: Secondary | ICD-10-CM

## 2021-11-16 DIAGNOSIS — C186 Malignant neoplasm of descending colon: Secondary | ICD-10-CM | POA: Diagnosis not present

## 2021-11-16 DIAGNOSIS — I129 Hypertensive chronic kidney disease with stage 1 through stage 4 chronic kidney disease, or unspecified chronic kidney disease: Secondary | ICD-10-CM | POA: Diagnosis not present

## 2021-11-16 DIAGNOSIS — D649 Anemia, unspecified: Secondary | ICD-10-CM | POA: Diagnosis not present

## 2021-11-16 DIAGNOSIS — F17210 Nicotine dependence, cigarettes, uncomplicated: Secondary | ICD-10-CM | POA: Diagnosis not present

## 2021-11-16 DIAGNOSIS — Z5111 Encounter for antineoplastic chemotherapy: Secondary | ICD-10-CM | POA: Diagnosis not present

## 2021-11-16 DIAGNOSIS — N189 Chronic kidney disease, unspecified: Secondary | ICD-10-CM | POA: Diagnosis not present

## 2021-11-16 LAB — CBC WITH DIFFERENTIAL/PLATELET
Abs Immature Granulocytes: 0.02 10*3/uL (ref 0.00–0.07)
Basophils Absolute: 0.1 10*3/uL (ref 0.0–0.1)
Basophils Relative: 1 %
Eosinophils Absolute: 0.3 10*3/uL (ref 0.0–0.5)
Eosinophils Relative: 7 %
HCT: 37.1 % — ABNORMAL LOW (ref 39.0–52.0)
Hemoglobin: 12.1 g/dL — ABNORMAL LOW (ref 13.0–17.0)
Immature Granulocytes: 0 %
Lymphocytes Relative: 47 %
Lymphs Abs: 2.4 10*3/uL (ref 0.7–4.0)
MCH: 31.8 pg (ref 26.0–34.0)
MCHC: 32.6 g/dL (ref 30.0–36.0)
MCV: 97.6 fL (ref 80.0–100.0)
Monocytes Absolute: 0.4 10*3/uL (ref 0.1–1.0)
Monocytes Relative: 8 %
Neutro Abs: 1.9 10*3/uL (ref 1.7–7.7)
Neutrophils Relative %: 37 %
Platelets: 171 10*3/uL (ref 150–400)
RBC: 3.8 MIL/uL — ABNORMAL LOW (ref 4.22–5.81)
RDW: 15.9 % — ABNORMAL HIGH (ref 11.5–15.5)
WBC: 5.1 10*3/uL (ref 4.0–10.5)
nRBC: 0 % (ref 0.0–0.2)

## 2021-11-16 LAB — COMPREHENSIVE METABOLIC PANEL
ALT: 19 U/L (ref 0–44)
AST: 22 U/L (ref 15–41)
Albumin: 3.4 g/dL — ABNORMAL LOW (ref 3.5–5.0)
Alkaline Phosphatase: 114 U/L (ref 38–126)
Anion gap: 8 (ref 5–15)
BUN: 25 mg/dL — ABNORMAL HIGH (ref 8–23)
CO2: 21 mmol/L — ABNORMAL LOW (ref 22–32)
Calcium: 8.9 mg/dL (ref 8.9–10.3)
Chloride: 113 mmol/L — ABNORMAL HIGH (ref 98–111)
Creatinine, Ser: 1.68 mg/dL — ABNORMAL HIGH (ref 0.61–1.24)
GFR, Estimated: 43 mL/min — ABNORMAL LOW (ref >60)
Glucose, Bld: 89 mg/dL (ref 70–99)
Potassium: 4.4 mmol/L (ref 3.5–5.1)
Sodium: 142 mmol/L (ref 135–145)
Total Bilirubin: 0.5 mg/dL (ref 0.3–1.2)
Total Protein: 7.4 g/dL (ref 6.5–8.1)

## 2021-11-16 LAB — MAGNESIUM: Magnesium: 1.7 mg/dL (ref 1.7–2.4)

## 2021-11-16 MED ORDER — LEUCOVORIN CALCIUM INJECTION 350 MG
320.0000 mg/m2 | Freq: Once | INTRAVENOUS | Status: DC
  Filled 2021-11-16: qty 31.4

## 2021-11-16 MED ORDER — PALONOSETRON HCL INJECTION 0.25 MG/5ML
0.2500 mg | Freq: Once | INTRAVENOUS | Status: AC
  Administered 2021-11-16: 0.25 mg via INTRAVENOUS
  Filled 2021-11-16: qty 5

## 2021-11-16 MED ORDER — DEXTROSE 5 % IV SOLN: Freq: Once | INTRAVENOUS | Status: AC

## 2021-11-16 MED ORDER — AMLODIPINE BESYLATE 2.5 MG PO TABS: 2.5000 mg | ORAL_TABLET | Freq: Every day | ORAL | 1 refills | Status: DC

## 2021-11-16 MED ORDER — SODIUM CHLORIDE 0.9 % IV SOLN
10.0000 mg | Freq: Once | INTRAVENOUS | Status: AC
  Administered 2021-11-16: 10 mg via INTRAVENOUS
  Filled 2021-11-16: qty 10

## 2021-11-16 MED ORDER — FLUOROURACIL CHEMO INJECTION 2.5 GM/50ML
320.0000 mg/m2 | Freq: Once | INTRAVENOUS | Status: AC
  Administered 2021-11-16: 650 mg via INTRAVENOUS
  Filled 2021-11-16: qty 13

## 2021-11-16 MED ORDER — OXALIPLATIN CHEMO INJECTION 100 MG/20ML
68.0000 mg/m2 | Freq: Once | INTRAVENOUS | Status: AC
  Administered 2021-11-16: 135 mg via INTRAVENOUS
  Filled 2021-11-16: qty 20

## 2021-11-16 MED ORDER — SODIUM CHLORIDE 0.9% FLUSH
10.0000 mL | INTRAVENOUS | Status: DC | PRN
  Administered 2021-11-16: 10 mL

## 2021-11-16 MED ORDER — LEUCOVORIN CALCIUM INJECTION 350 MG
320.0000 mg/m2 | Freq: Once | INTRAVENOUS | Status: AC
  Administered 2021-11-16: 628 mg via INTRAVENOUS
  Filled 2021-11-16: qty 31.4

## 2021-11-16 MED ORDER — SODIUM CHLORIDE 0.9 % IV SOLN
1920.0000 mg/m2 | INTRAVENOUS | Status: DC
  Administered 2021-11-16: 3750 mg via INTRAVENOUS
  Filled 2021-11-16: qty 75

## 2021-11-16 NOTE — Progress Notes (Signed)
Patient has been assessed, vital signs and labs have been reviewed by Dr. Katragadda. ANC, Creatinine, LFTs, and Platelets are within treatment parameters per Dr. Katragadda. The patient is good to proceed with treatment at this time. Primary RN and pharmacy aware.  

## 2021-11-16 NOTE — Patient Instructions (Signed)
[facility]  Discharge Instructions  You were seen and examined today by Dr. Delton Coombes.  Proceed with treatment today.  Follow-up as scheduled.   Thank you for choosing [facility] to provide your oncology and hematology care.   To afford each patient quality time with our provider, please arrive at least 15 minutes before your scheduled appointment time. You may need to reschedule your appointment if you arrive late (10 or more minutes). Arriving late affects you and other patients whose appointments are after yours.  Also, if you miss three or more appointments without notifying the office, you may be dismissed from the clinic at the provider's discretion.    Again, thank you for choosing [facility].  Our hope is that these requests will decrease the amount of time that you wait before being seen by our physicians.   If you have a lab appointment with the [facility] please come in thru the Main Entrance and check in at the main information desk.           _____________________________________________________________  Should you have questions after your visit to [facility], please contact our office at [phone] and follow the prompts.  Our office hours are 8:00 a.m. to 4:30 p.m. Monday - Thursday and 8:00 a.m. to 2:30 p.m. Friday.  Please note that voicemails left after 4:00 p.m. may not be returned until the following business day.  We are closed weekends and all major holidays.  You do have access to a nurse 24-7, just call the main number to the clinic [phone] and do not press any options, hold on the line and a nurse will answer the phone.    For prescription refill requests, have your pharmacy contact our office and allow 72 hours.    Masks are optional in the cancer centers. If you would like for your care team to wear a mask while they are taking care of you, please let them know. You  may have one support person who is at least 71 years old accompany you for your appointments.

## 2021-11-16 NOTE — Patient Instructions (Signed)
[facility]  Discharge Instructions: Thank you for choosing [facility] to provide your oncology and hematology care.  If you have a lab appointment with the [facility], please come in thru the Main Entrance and check in at the main information desk.  Wear comfortable clothing and clothing appropriate for easy access to any Portacath or PICC line.   We strive to give you quality time with your provider. You may need to reschedule your appointment if you arrive late (15 or more minutes).  Arriving late affects you and other patients whose appointments are after yours.  Also, if you miss three or more appointments without notifying the office, you may be dismissed from the clinic at the provider's discretion.      For prescription refill requests, have your pharmacy contact our office and allow 72 hours for refills to be completed.    Today you received the following chemotherapy and/or immunotherapy agents Leucovorin, oxaliplatin, and leucovorin, return as scheduled.   To help prevent nausea and vomiting after your treatment, we encourage you to take your nausea medication as directed.  BELOW ARE SYMPTOMS THAT SHOULD BE REPORTED IMMEDIATELY: *FEVER GREATER THAN 100.4 F (38 C) OR HIGHER *CHILLS OR SWEATING *NAUSEA AND VOMITING THAT IS NOT CONTROLLED WITH YOUR NAUSEA MEDICATION *UNUSUAL SHORTNESS OF BREATH *UNUSUAL BRUISING OR BLEEDING *URINARY PROBLEMS (pain or burning when urinating, or frequent urination) *BOWEL PROBLEMS (unusual diarrhea, constipation, pain near the anus) TENDERNESS IN MOUTH AND THROAT WITH OR WITHOUT PRESENCE OF ULCERS (sore throat, sores in mouth, or a toothache) UNUSUAL RASH, SWELLING OR PAIN  UNUSUAL VAGINAL DISCHARGE OR ITCHING   Items with * indicate a potential emergency and should be followed up as soon as possible or go to the Emergency Department if any problems should occur.  Please show the CHEMOTHERAPY ALERT CARD or  IMMUNOTHERAPY ALERT CARD at check-in to the Emergency Department and triage nurse.  Should you have questions after your visit or need to cancel or reschedule your appointment, please contact [facility] [phone]  and follow the prompts.  Office hours are 8:00 a.m. to 4:30 p.m. Monday - Friday. Please note that voicemails left after 4:00 p.m. may not be returned until the following business day.  We are closed weekends and major holidays. You have access to a nurse at all times for urgent questions. Please call the main number to the clinic [phone] and follow the prompts.  For any non-urgent questions, you may also contact your provider using MyChart. We now offer e-Visits for anyone 62 and older to request care online for non-urgent symptoms. For details visit [email or web address].   Also download the MyChart app! Go to the app store, search "MyChart", open the app, select [facility], and log in with your MyChart username and password.  Masks are optional in the cancer centers. If you would like for your care team to wear a mask while they are taking care of you, please let them know. You may have one support person who is at least 71 years old accompany you for your appointments.

## 2021-11-16 NOTE — Progress Notes (Signed)
Patients port flushed without difficulty.  Good blood return noted with no bruising or swelling noted at site.  Stable during access and blood draw.  Patient to remain accessed for treatment. 

## 2021-11-16 NOTE — Progress Notes (Signed)
[place], [place] [address]   CLINIC:  Medical Oncology/Hematology  PCP:  [name], FNP [name] [place] [address] [phone]   REASON FOR VISIT:  Follow-up for stage 3B right colon cancer  PRIOR THERAPY: Resection of splenic flexure mass on 04/23/2021 by Dr. Marcello Moores, right colectomy on 08/18/2021  NGS Results:  MSI-stable  CURRENT THERAPY: Surveillance   CANCER STAGING:  Cancer Staging  Cancer of left colon [facility]) Staging form: Colon and Rectum, AJCC 8th Edition - Clinical stage from 05/11/2021: Stage IIA (cT3, cN0, cM0) - Unsigned  Primary cancer of hepatic flexure s/p robotic right colectomy 08/18/2021 Staging form: Colon and Rectum, AJCC 8th Edition - Clinical stage from 09/23/2021: Stage IIIB (cT4a, cN1a, cM0) - Unsigned   INTERVAL HISTORY:  [name], a 71 y.o. male, seen today for follow-up of colon cancer and chemotherapy with FOLFOX.  Cycle 2 was on 10/19/2021.  Cycle 3 was held on 11/02/2021 due to dehydration secondary to vomiting.  He reports that vomiting has subsided now.  His appetite and energy levels are 80%.  Denies any tingling or numbness in the extremities.  REVIEW OF SYSTEMS:  Review of Systems  Respiratory:  Positive for cough.   All other systems reviewed and are negative.   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 - [employer] Jude/[employer] Ass[employer] MRI 2272 dual chamber PPM   Tuberculosis    as a child, was treated   Past Surgical History:  Procedure Laterality Date   ABDOMINAL AORTIC ENDOVASCULAR STENT GRAFT Bilateral 02/26/2021   Procedure: ABDOMINAL AORTIC  ENDOVASCULAR STENT GRAFT REPAIR;  Surgeon: Marty Heck, MD;  Location: [facility] OR;  Service: Vascular;  Laterality: Bilateral;   BIOPSY  03/12/2021   Procedure: BIOPSY;  Surgeon: Harvel Quale, MD;  [facility];  Service: Gastroenterology;;   BIOPSY  06/25/2021   Procedure: BIOPSY;  Surgeon: Harvel Quale, MD;  [facility];  Service: Gastroenterology;;  mass    COLONOSCOPY WITH PROPOFOL N/A 03/12/2021   Procedure: COLONOSCOPY WITH PROPOFOL;  Surgeon: Harvel Quale, MD;  [facility];  Service: Gastroenterology;  Laterality: N/A;  10:55   COLONOSCOPY WITH PROPOFOL N/A 06/25/2021   Procedure: COLONOSCOPY WITH PROPOFOL;  Surgeon: Harvel Quale, MD;  [facility];  Service: Gastroenterology;  Laterality: N/A;  235 ASA 2   HEMOSTASIS CLIP PLACEMENT  03/12/2021   Procedure: HEMOSTASIS CLIP PLACEMENT;  Surgeon: Harvel Quale, MD;  [facility];  Service: Gastroenterology;;   HEMOSTASIS CLIP PLACEMENT  06/25/2021   Procedure: HEMOSTASIS CLIP PLACEMENT;  Surgeon: Harvel Quale, MD;  [facility];  Service: Gastroenterology;;   HERNIA REPAIR Left 10/25/2006   IR IMAGING GUIDED PORT INSERTION  09/29/2021   PACEMAKER IMPLANT N/A 02/08/2021   Procedure: PACEMAKER IMPLANT;  Surgeon: Deboraha Sprang, MD;  Location: [facility];  Service: Cardiovascular;  Laterality: N/A;   POLYPECTOMY  03/12/2021   Procedure: POLYPECTOMY;  Surgeon: Harvel Quale, MD;  [facility];  Service: Gastroenterology;;   POLYPECTOMY  06/25/2021   Procedure: POLYPECTOMY;  Surgeon: [name], [name], MD;  [facility];  Service: Gastroenterology;;   [name]  03/12/2021   Procedure: [name];  Surgeon: [name], [name], MD;  [facility];  Service: Gastroenterology;;    SOCIAL HISTORY:  Social History   Socioeconomic History    Marital status: Single    Spouse name: Not on file   Number of children: Not on file   Years of education: Not on file   Highest education level: Not on file  Occupational History   Not on file  Tobacco Use   Smoking status: Every Day    Packs/day: 0.75    Years: 50.00    Total pack years: 37.50    Types: Cigarettes    Passive exposure: Current   Smokeless tobacco: Never  Vaping Use   Vaping Use: Never used  Substance and Sexual Activity   Alcohol use: Yes    Alcohol/week: 2.0 - 3.0 standard drinks of alcohol    Types: 2 - 3 Shots of liquor per week    Comment: daily liquor moderatly   Drug use: Not on file    Comment: uses every other day   Sexual activity: Not Currently  Other Topics Concern   Not on file  Social History Narrative   Not on file   Social Determinants of Health   Financial Resource Strain: High Risk (10/14/2021)   Overall Financial Resource Strain (CARDIA)    Difficulty of Paying Living Expenses: Hard  Food Insecurity: Not on file  Transportation Needs: Not on file  Physical Activity: Not on file  Stress: Not on file  Social Connections: Not on file  Intimate Partner Violence: Not on file    FAMILY HISTORY:  Family History  Problem Relation Age of Onset   Heart disease Father    Colon polyps Sister        less than 10 lifetime polyps   Colon cancer Maternal Grandmother        dx 90s    CURRENT MEDICATIONS:  Current Outpatient Medications  Medication Sig Dispense Refill   albuterol (VENTOLIN HFA) 108 (90 Base) MCG/ACT inhaler Inhale 2 puffs into the lungs every 6 (six) hours as needed for wheezing or shortness of breath. 8 g 2   amLODipine (NORVASC) 2.5 MG tablet Take 1 tablet (2.5 mg total) by mouth daily. 30 tablet 1   aspirin EC 81 MG EC tablet Take 1 tablet (81 mg total) by mouth daily at 6 (six) AM. Swallow whole. 30 tablet 11   bisacodyl (DULCOLAX) 5 MG EC tablet Take 5 mg by mouth daily as needed for moderate constipation.      carvedilol (COREG) 6.25 MG tablet Take 1 tablet (6.25 mg total) by mouth 2 (two) times daily with a meal. 60 tablet 3   famotidine (PEPCID) 20 MG tablet Take 20 mg by mouth daily as needed for heartburn or indigestion.     feeding supplement (ENSURE ENLIVE / ENSURE PLUS) LIQD Take 237 mLs by mouth 3 (three) times daily between meals. (Patient taking differently: Take 237 mLs by mouth 3 (three) times daily as needed (nutrition).) 237 mL 12   fluorouracil CALGB 68127 2,400 mg/m2 in sodium chloride 0.9 % 150 mL Inject 2,400 mg/m2 into the vein over 48 hr. Every 14 days     FLUOROURACIL IV Inject into the vein every 14 (fourteen) days.     LEUCOVORIN CALCIUM IV Inject into  the vein every 14 (fourteen) days.     loperamide (IMODIUM A-D) 2 MG tablet Take 1 tablet (2 mg total) by mouth 4 (four) times daily as needed for diarrhea or loose stools.  0   Multiple Vitamins-Minerals (MULTIVITAMIN WITH MINERALS) tablet Take 1 tablet by mouth daily. Centrum Silver for Men 50+     nicotine (NICODERM CQ - DOSED IN MG/24 HOURS) 21 mg/24hr patch Place 21 mg onto the skin daily.     OXALIPLATIN IV Inject into the vein every 14 (fourteen) days.     simvastatin (ZOCOR) 20 MG tablet Take 20 mg by mouth daily.     No current facility-administered medications for this visit.   Facility-Administered Medications Ordered in Other Visits  Medication Dose Route Frequency Provider Last Rate Last Admin   magnesium sulfate 2 GM/50ML IVPB            palonosetron (ALOXI) 0.25 MG/5ML injection             ALLERGIES:  Allergies  Allergen Reactions   Lisinopril Anaphylaxis   Naproxen Shortness Of Breath and Palpitations   Nsaids Shortness Of Breath and Palpitations    PHYSICAL EXAM:  Performance status (ECOG): 1 - Symptomatic but completely ambulatory  There were no vitals filed for this visit.   Wt Readings from Last 3 Encounters:  11/16/21 168 lb 12.8 oz (76.6 kg)  11/09/21 166 lb (75.3 kg)  11/02/21 162 lb 6.4 oz  (73.7 kg)   Physical Exam Vitals reviewed.  Constitutional:      Appearance: Normal appearance.  Cardiovascular:     Rate and Rhythm: Normal rate and regular rhythm.     Pulses: Normal pulses.     Heart sounds: Normal heart sounds.  Pulmonary:     Effort: Pulmonary effort is normal.     Breath sounds: Normal breath sounds.  Neurological:     General: No focal deficit present.     Mental Status: He is alert and oriented to person, place, and time.  Psychiatric:        Mood and Affect: Mood normal.        Behavior: Behavior normal.     LABORATORY DATA:  I have reviewed the labs as listed.     Latest Ref Rng & Units 11/02/2021   10:11 AM 10/19/2021    9:01 AM 10/05/2021    8:31 AM  CBC  WBC 4.0 - 10.5 K/uL 5.6  5.3  6.1   Hemoglobin 13.0 - 17.0 g/dL 13.0  11.7  11.3   Hematocrit 39.0 - 52.0 % 38.9  35.3  34.4   Platelets 150 - 400 K/uL 147  181  164       Latest Ref Rng & Units 11/02/2021   10:11 AM 10/19/2021    9:01 AM 10/05/2021    8:31 AM  CMP  Glucose 70 - 99 mg/dL 129  96  119   BUN 8 - 23 mg/dL 47  22  22   Creatinine 0.61 - 1.24 mg/dL 2.32  1.51  1.78   Sodium 135 - 145 mmol/L 138  139  139   Potassium 3.5 - 5.1 mmol/L 5.0  4.5  4.6   Chloride 98 - 111 mmol/L 109  113  113   CO2 22 - 32 mmol/L 21  20  19    Calcium 8.9 - 10.3 mg/dL 9.4  8.9  9.1   Total Protein 6.5 - 8.1 g/dL 8.0  7.5  7.4   Total Bilirubin  0.3 - 1.2 mg/dL 0.6  0.5  0.8   Alkaline Phos 38 - 126 U/L 128  109  101   AST 15 - 41 U/L 43  24  21   ALT 0 - 44 U/L 29  19  17      DIAGNOSTIC IMAGING:  I have independently reviewed the scans and discussed with the patient. CUP PACEART REMOTE DEVICE CHECK  Result Date: 11/10/2021 Scheduled remote reviewed. Normal device function.  Next remote 91 days. LA  VAS [place] EVAR DUPLEX  Result Date: 11/09/2021 Endovascular Aortic Repair Study (EVAR) Patient Name:  LATASHA PUSKAS  Date of Exam:   11/09/2021 Medical Rec #: [id number]         Accession #:     [id number] Date of Birth: 1950/05/15         Patient Gender: M Patient Age:   36 years Exam Location:  [facility] Procedure:      VAS [place] EVAR DUPLEX Referring Phys: Monica Martinez --------------------------------------------------------------------------------  Indications: Follow up exam for EVAR. Surgery date 02/26/21. Risk Factors: Hypertension, current smoker. Vascular Interventions: EVAR placed 02/26/21 by Dr Carlis Abbott. Limitations: Air/bowel gas.  Performing Technologist: Elta Guadeloupe RVT, RDMS  Examination Guidelines: A complete evaluation includes B-mode imaging, spectral Doppler, color Doppler, and power Doppler as needed of all accessible portions of each vessel. Bilateral testing is considered an integral part of a complete examination. Limited examinations for reoccurring indications may be performed as noted.  Endovascular Aortic Repair (EVAR): +----------+----------------+-------------------+-------------------+           Diameter AP (cm)Diameter Trans (cm)Velocities (cm/sec) +----------+----------------+-------------------+-------------------+ Aorta     5.00            5.00               34                  +----------+----------------+-------------------+-------------------+ Right Limb4.10            2.80               41                  +----------+----------------+-------------------+-------------------+ Left Limb 2.90            2.20               40                  +----------+----------------+-------------------+-------------------+  Summary: Abdominal Aorta: 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  *See table(s) above for measurements and observations.  Electronically signed by Monica Martinez MD on 11/09/2021 at 8:25:59 AM.    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 - Colonoscopy on 03/12/2021: Fungating partially obstructing large mass found at 40 cm  proximal to the anus, circumferential with no bleeding present. - CEA on 03/12/2021: 8.3 - CT angio abdomen and pelvis on 04/08/2021: No evidence of metastatic lymphadenopathy or liver mets.  Stable 7 mm subpleural pulmonary nodule in the periphery of the right lower lobe. - CT angio CAP on 02/04/2021: No evidence of lung metastasis, abdominal metastasis. - Resection of splenic flexure mass on 04/23/2021 by Dr. Marcello Moores. - Pathology: Moderately differentiated adenocarcinoma, invades into subserosa, margins negative, 0/38 lymph nodes.  No LVI or perineural invasion.  PT3 pN0.  MSI-stable. - Guardant reveal (05/21/2021): CT DNA was detected. - PET scan on 06/10/2021  shows 2 cm hypermetabolic soft tissue density in the transverse colon.  No evidence of local or distant metastatic disease.    Social/family history: - He is a retired [employer]. - Current active smoker, half pack per day for 50 years. - Mother had colon cancer at age 67.  Maternal grandmother had colon cancer.  3.  Stage III (P6PP5) right colon adenocarcinoma: - Right colectomy on 08/10/2021 by Dr. Marcello Moores. - Pathology with grade 2 moderately differentiated colonic adenocarcinoma, tumor site ascending colon, margins negative, 1/22 lymph nodes involved, [id number].  MMR preserved.        -Cycle 1 of FOLFOX started on 10/05/2021   PLAN:  Stage III (T4AN1) right colon adenocarcinoma, MMR preserved: - Cycle 2 of FOLFOX on 10/19/2021. - Cycle 3 held on 11/02/2021 due to dehydration from vomiting. - Reviewed labs today which shows creatinine has improved to his baseline of 1.68.  LFTs are normal.  CBC was grossly normal.  He does not have any symptoms of nausea or vomiting. - He will proceed with cycle 3 today with 20% dose reduction.  RTC 2 weeks for follow-up.  2.  Normocytic anemia: - Combination anemia from CKD and relative iron deficiency. - Hemoglobin today is 12.1.  3.  Chemotherapy-induced diarrhea: - Use Imodium as needed.    Orders placed this encounter:  No orders of the defined types were placed in this encounter.    Derek Jack, MD [place] [phone]

## 2021-11-16 NOTE — Progress Notes (Signed)
Patient presents today for FOLFOX, Ser. Creatinine 1.68, patient okay for treatment per Dr. Delton Coombes.  Patient tolerated chemotherapy with no complaints voiced. Side effects with management reviewed understanding verbalized. Port site clean and dry with no bruising or swelling noted at site. Good blood return noted before and after administration of chemotherapy. Chemo pump connected with no alarms noted. Patient left in satisfactory condition with VSS and no s/s of distress noted.

## 2021-11-18 ENCOUNTER — Inpatient Hospital Stay: Payer: Medicare Other

## 2021-11-18 ENCOUNTER — Other Ambulatory Visit: Payer: Self-pay

## 2021-11-18 VITALS — BP 145/81 | HR 76 | Temp 98.4°F | Resp 18

## 2021-11-18 DIAGNOSIS — C183 Malignant neoplasm of hepatic flexure: Secondary | ICD-10-CM | POA: Diagnosis not present

## 2021-11-18 DIAGNOSIS — R197 Diarrhea, unspecified: Secondary | ICD-10-CM | POA: Diagnosis not present

## 2021-11-18 DIAGNOSIS — C186 Malignant neoplasm of descending colon: Secondary | ICD-10-CM | POA: Diagnosis not present

## 2021-11-18 DIAGNOSIS — Z95828 Presence of other vascular implants and grafts: Secondary | ICD-10-CM

## 2021-11-18 DIAGNOSIS — F17210 Nicotine dependence, cigarettes, uncomplicated: Secondary | ICD-10-CM | POA: Diagnosis not present

## 2021-11-18 DIAGNOSIS — Z5111 Encounter for antineoplastic chemotherapy: Secondary | ICD-10-CM | POA: Diagnosis not present

## 2021-11-18 DIAGNOSIS — N189 Chronic kidney disease, unspecified: Secondary | ICD-10-CM | POA: Diagnosis not present

## 2021-11-18 DIAGNOSIS — D649 Anemia, unspecified: Secondary | ICD-10-CM | POA: Diagnosis not present

## 2021-11-18 DIAGNOSIS — I129 Hypertensive chronic kidney disease with stage 1 through stage 4 chronic kidney disease, or unspecified chronic kidney disease: Secondary | ICD-10-CM | POA: Diagnosis not present

## 2021-11-18 MED ORDER — HEPARIN SOD (PORK) LOCK FLUSH 100 UNIT/ML IV SOLN
500.0000 [IU] | Freq: Once | INTRAVENOUS | Status: AC | PRN
  Administered 2021-11-18: 500 [IU]

## 2021-11-18 MED ORDER — SODIUM CHLORIDE 0.9% FLUSH
10.0000 mL | INTRAVENOUS | Status: AC | PRN
  Administered 2021-11-18: 10 mL

## 2021-11-19 NOTE — Progress Notes (Signed)
Remote pacemaker transmission.   

## 2021-11-30 ENCOUNTER — Inpatient Hospital Stay: Payer: Medicare Other

## 2021-11-30 ENCOUNTER — Inpatient Hospital Stay: Payer: Medicare Other | Attending: Hematology

## 2021-11-30 ENCOUNTER — Encounter: Payer: Self-pay | Admitting: Hematology

## 2021-11-30 ENCOUNTER — Inpatient Hospital Stay (HOSPITAL_BASED_OUTPATIENT_CLINIC_OR_DEPARTMENT_OTHER): Payer: Medicare Other | Admitting: Hematology

## 2021-11-30 VITALS — BP 148/84 | HR 74 | Temp 97.6°F | Resp 18 | Wt 168.6 lb

## 2021-11-30 DIAGNOSIS — Z95828 Presence of other vascular implants and grafts: Secondary | ICD-10-CM

## 2021-11-30 DIAGNOSIS — R197 Diarrhea, unspecified: Secondary | ICD-10-CM | POA: Diagnosis not present

## 2021-11-30 DIAGNOSIS — N189 Chronic kidney disease, unspecified: Secondary | ICD-10-CM | POA: Insufficient documentation

## 2021-11-30 DIAGNOSIS — C186 Malignant neoplasm of descending colon: Secondary | ICD-10-CM

## 2021-11-30 DIAGNOSIS — C183 Malignant neoplasm of hepatic flexure: Secondary | ICD-10-CM | POA: Insufficient documentation

## 2021-11-30 DIAGNOSIS — E875 Hyperkalemia: Secondary | ICD-10-CM

## 2021-11-30 DIAGNOSIS — D631 Anemia in chronic kidney disease: Secondary | ICD-10-CM | POA: Insufficient documentation

## 2021-11-30 DIAGNOSIS — Z452 Encounter for adjustment and management of vascular access device: Secondary | ICD-10-CM | POA: Insufficient documentation

## 2021-11-30 DIAGNOSIS — Z5111 Encounter for antineoplastic chemotherapy: Secondary | ICD-10-CM | POA: Diagnosis not present

## 2021-11-30 DIAGNOSIS — R911 Solitary pulmonary nodule: Secondary | ICD-10-CM | POA: Diagnosis not present

## 2021-11-30 LAB — CBC WITH DIFFERENTIAL/PLATELET
Abs Immature Granulocytes: 0.01 10*3/uL (ref 0.00–0.07)
Basophils Absolute: 0 10*3/uL (ref 0.0–0.1)
Basophils Relative: 1 %
Eosinophils Absolute: 0.3 10*3/uL (ref 0.0–0.5)
Eosinophils Relative: 5 %
HCT: 35.7 % — ABNORMAL LOW (ref 39.0–52.0)
Hemoglobin: 11.9 g/dL — ABNORMAL LOW (ref 13.0–17.0)
Immature Granulocytes: 0 %
Lymphocytes Relative: 38 %
Lymphs Abs: 1.9 10*3/uL (ref 0.7–4.0)
MCH: 32.8 pg (ref 26.0–34.0)
MCHC: 33.3 g/dL (ref 30.0–36.0)
MCV: 98.3 fL (ref 80.0–100.0)
Monocytes Absolute: 0.6 10*3/uL (ref 0.1–1.0)
Monocytes Relative: 11 %
Neutro Abs: 2.2 10*3/uL (ref 1.7–7.7)
Neutrophils Relative %: 45 %
Platelets: 135 10*3/uL — ABNORMAL LOW (ref 150–400)
RBC: 3.63 MIL/uL — ABNORMAL LOW (ref 4.22–5.81)
RDW: 16.1 % — ABNORMAL HIGH (ref 11.5–15.5)
WBC: 4.9 10*3/uL (ref 4.0–10.5)
nRBC: 0 % (ref 0.0–0.2)

## 2021-11-30 LAB — COMPREHENSIVE METABOLIC PANEL
ALT: 20 U/L (ref 0–44)
AST: 27 U/L (ref 15–41)
Albumin: 3.5 g/dL (ref 3.5–5.0)
Alkaline Phosphatase: 113 U/L (ref 38–126)
Anion gap: 5 (ref 5–15)
BUN: 25 mg/dL — ABNORMAL HIGH (ref 8–23)
CO2: 21 mmol/L — ABNORMAL LOW (ref 22–32)
Calcium: 8.9 mg/dL (ref 8.9–10.3)
Chloride: 112 mmol/L — ABNORMAL HIGH (ref 98–111)
Creatinine, Ser: 1.63 mg/dL — ABNORMAL HIGH (ref 0.61–1.24)
GFR, Estimated: 45 mL/min — ABNORMAL LOW (ref >60)
Glucose, Bld: 132 mg/dL — ABNORMAL HIGH (ref 70–99)
Potassium: 5.2 mmol/L — ABNORMAL HIGH (ref 3.5–5.1)
Sodium: 138 mmol/L (ref 135–145)
Total Bilirubin: 0.6 mg/dL (ref 0.3–1.2)
Total Protein: 7.2 g/dL (ref 6.5–8.1)

## 2021-11-30 LAB — MAGNESIUM: Magnesium: 2.2 mg/dL (ref 1.7–2.4)

## 2021-11-30 MED ORDER — DEXTROSE 5 % IV SOLN: Freq: Once | INTRAVENOUS | Status: AC

## 2021-11-30 MED ORDER — FLUOROURACIL CHEMO INJECTION 2.5 GM/50ML
320.0000 mg/m2 | Freq: Once | INTRAVENOUS | Status: AC
  Administered 2021-11-30: 650 mg via INTRAVENOUS
  Filled 2021-11-30: qty 13

## 2021-11-30 MED ORDER — SODIUM POLYSTYRENE SULFONATE 15 GM/60ML PO SUSP
30.0000 g | Freq: Once | ORAL | Status: AC
  Administered 2021-11-30: 30 g via ORAL
  Filled 2021-11-30: qty 120

## 2021-11-30 MED ORDER — OXALIPLATIN CHEMO INJECTION 100 MG/20ML
68.0000 mg/m2 | Freq: Once | INTRAVENOUS | Status: AC
  Administered 2021-11-30: 135 mg via INTRAVENOUS
  Filled 2021-11-30: qty 10

## 2021-11-30 MED ORDER — SODIUM CHLORIDE 0.9 % IV SOLN
10.0000 mg | Freq: Once | INTRAVENOUS | Status: AC
  Administered 2021-11-30: 10 mg via INTRAVENOUS
  Filled 2021-11-30: qty 10

## 2021-11-30 MED ORDER — PALONOSETRON HCL INJECTION 0.25 MG/5ML
0.2500 mg | Freq: Once | INTRAVENOUS | Status: AC
  Administered 2021-11-30: 0.25 mg via INTRAVENOUS
  Filled 2021-11-30: qty 5

## 2021-11-30 MED ORDER — SODIUM CHLORIDE 0.9 % IV SOLN
1920.0000 mg/m2 | INTRAVENOUS | Status: DC
  Administered 2021-11-30: 3750 mg via INTRAVENOUS
  Filled 2021-11-30: qty 75

## 2021-11-30 MED ORDER — LEUCOVORIN CALCIUM INJECTION 350 MG
320.0000 mg/m2 | Freq: Once | INTRAVENOUS | Status: AC
  Administered 2021-11-30: 628 mg via INTRAVENOUS
  Filled 2021-11-30: qty 31.4

## 2021-11-30 NOTE — Progress Notes (Signed)
Pt presents today for Folfox per provider's order. Vital signs and other labs WNL for treatment. Pt's potassium is 5.2 and creatinine is 1.63. Okay to proceed with treatment today per Dr.K. Pt will also receive Kayexalate 30 g due to increased potassium to take at home per Dr.K.  Folfox given today per MD orders. Tolerated infusion without adverse affects. Vital signs stable. No complaints at this time. Discharged from clinic ambulatory in stable condition. Alert and oriented x 3. F/U with [facility] as scheduled.

## 2021-11-30 NOTE — Progress Notes (Signed)
Confirmed dose at current dose of 20% reduction, no additional reduction for today.  T.O. Dr Rhys Martini, PharmD

## 2021-11-30 NOTE — Patient Instructions (Signed)
[place] at [facility] Discharge Instructions   You were seen and examined today by Dr. Delton Coombes.  He reviewed the results of your lab work which are normal/stable, except for your potassium. It is elevated at 5.2. We will give you medication to take at home to get this down. Please stop your multivitamin.   We will proceed with your treatment today.  Return as scheduled.    Thank you for choosing [place] at [facility] to provide your oncology and hematology care.  To afford each patient quality time with our provider, please arrive at least 15 minutes before your scheduled appointment time.   If you have a lab appointment with the [place] please come in thru the Main Entrance and check in at the main information desk.  You need to re-schedule your appointment should you arrive 10 or more minutes late.  We strive to give you quality time with our providers, and arriving late affects you and other patients whose appointments are after yours.  Also, if you no show three or more times for appointments you may be dismissed from the clinic at the providers discretion.     Again, thank you for choosing [facility].  Our hope is that these requests will decrease the amount of time that you wait before being seen by our physicians.       _____________________________________________________________  Should you have questions after your visit to [facility], please contact our office at [phone] and follow the prompts.  Our office hours are 8:00 a.m. and 4:30 p.m. Monday - Friday.  Please note that voicemails left after 4:00 p.m. may not be returned until the following business day.  We are closed weekends and major holidays.  You do have access to a nurse 24-7, just call the main number to the clinic [phone] and do not press any options, hold on the line and a nurse will answer the phone.    For  prescription refill requests, have your pharmacy contact our office and allow 72 hours.    Due to Covid, you will need to wear a mask upon entering the hospital. If you do not have a mask, a mask will be given to you at the Main Entrance upon arrival. For doctor visits, patients may have 1 support person age 84 or older with them. For treatment visits, patients can not have anyone with them due to social distancing guidelines and our immunocompromised population.

## 2021-11-30 NOTE — Progress Notes (Unsigned)
Patients port flushed without difficulty.  Good blood return noted with no bruising or swelling noted at site.  Stable during access and blood draw.  Patient to remain accessed for treatment. 

## 2021-11-30 NOTE — Progress Notes (Signed)
[address] [place], [place] [address]   CLINIC:  Medical Oncology/Hematology  PCP:  [name], FNP [address] [name] [place] [address] [phone]   REASON FOR VISIT:  Follow-up for stage 3B right colon cancer  PRIOR THERAPY: Resection of splenic flexure mass on 04/23/2021 by Dr. Marcello Moores, right colectomy on 08/18/2021  NGS Results:  MSI-stable  CURRENT THERAPY: Surveillance   CANCER STAGING:  Cancer Staging  Cancer of left colon [facility]) Staging form: Colon and Rectum, AJCC 8th Edition - Clinical stage from 05/11/2021: Stage IIA (cT3, cN0, cM0) - Unsigned  Primary cancer of hepatic flexure s/p robotic right colectomy 08/18/2021 Staging form: Colon and Rectum, AJCC 8th Edition - Clinical stage from 09/23/2021: Stage IIIB (cT4a, cN1a, cM0) - Unsigned   INTERVAL HISTORY:  [name], a 71 y.o. male, seen for follow-up of colon cancer.  He reports energy levels of 75%.  Reports dyspnea on exertion.  He walks about a mile in the morning.  He is eating well and has gained about 2.5 pounds since last visit.  Denies any tingling or numbness in extremities.  Denies any significant cold sensitivity.  REVIEW OF SYSTEMS:  Review of Systems  Respiratory:  Positive for shortness of breath (exertion).   All other systems reviewed and are negative.   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 - St Jude/[employer] Ass[employer] MRI 2272 dual chamber PPM   Tuberculosis    as a child, was treated   Past Surgical History:  Procedure Laterality Date   ABDOMINAL AORTIC ENDOVASCULAR STENT GRAFT Bilateral 02/26/2021   Procedure: ABDOMINAL  AORTIC ENDOVASCULAR STENT GRAFT REPAIR;  Surgeon: Marty Heck, MD;  Location: [facility] OR;  Service: Vascular;  Laterality: Bilateral;   BIOPSY  03/12/2021   Procedure: BIOPSY;  Surgeon: Harvel Quale, MD;  [facility];  Service: Gastroenterology;;   BIOPSY  06/25/2021   Procedure: BIOPSY;  Surgeon: Harvel Quale, MD;  [facility];  Service: Gastroenterology;;  mass    COLONOSCOPY WITH PROPOFOL N/A 03/12/2021   Procedure: COLONOSCOPY WITH PROPOFOL;  Surgeon: Harvel Quale, MD;  [facility];  Service: Gastroenterology;  Laterality: N/A;  10:55   COLONOSCOPY WITH PROPOFOL N/A 06/25/2021   Procedure: COLONOSCOPY WITH PROPOFOL;  Surgeon: Harvel Quale, MD;  [facility]: [facility];  Service: Gastroenterology;  Laterality: N/A;  235 ASA 2   HEMOSTASIS CLIP PLACEMENT  03/12/2021   Procedure: HEMOSTASIS CLIP PLACEMENT;  Surgeon: Harvel Quale, MD;  [facility];  Service: Gastroenterology;;   HEMOSTASIS CLIP PLACEMENT  06/25/2021   Procedure: HEMOSTASIS CLIP PLACEMENT;  Surgeon: Harvel Quale, MD;  [facility];  Service: Gastroenterology;;   HERNIA REPAIR Left 10/25/2006   IR IMAGING GUIDED PORT INSERTION  09/29/2021   PACEMAKER IMPLANT N/A 02/08/2021   Procedure: PACEMAKER IMPLANT;  Surgeon: Deboraha Sprang, MD;  Location: [facility];  Service: Cardiovascular;  Laterality: N/A;   POLYPECTOMY  03/12/2021   Procedure: POLYPECTOMY;  Surgeon: Harvel Quale, MD;  [facility];  Service: Gastroenterology;;   POLYPECTOMY  06/25/2021   Procedure: POLYPECTOMY;  Surgeon: [name], [name], MD;  [facility];  Service: Gastroenterology;;   [name]  03/12/2021   Procedure: [name];  Surgeon: [name], [name], MD;  [facility];  Service: Gastroenterology;;    SOCIAL HISTORY:  Social History   Socioeconomic  History   Marital status: Single    Spouse name: Not on file   Number of children: Not on file   Years of education: Not on file   Highest education level: Not on file  Occupational History   Not on file  Tobacco Use   Smoking status: Every Day    Packs/day: 0.75    Years: 50.00    Total pack years: 37.50    Types: Cigarettes    Passive exposure: Current   Smokeless tobacco: Never  Vaping Use   Vaping Use: Never used  Substance and Sexual Activity   Alcohol use: Yes    Alcohol/week: 2.0 - 3.0 standard drinks of alcohol    Types: 2 - 3 Shots of liquor per week    Comment: daily liquor moderatly   Drug use: Not on file    Comment: uses every other day   Sexual activity: Not Currently  Other Topics Concern   Not on file  Social History Narrative   Not on file   Social Determinants of Health   Financial Resource Strain: High Risk (10/14/2021)   Overall Financial Resource Strain (CARDIA)    Difficulty of Paying Living Expenses: Hard  Food Insecurity: Not on file  Transportation Needs: Not on file  Physical Activity: Not on file  Stress: Not on file  Social Connections: Not on file  Intimate Partner Violence: Not on file    FAMILY HISTORY:  Family History  Problem Relation Age of Onset   Heart disease Father    Colon polyps Sister        less than 10 lifetime polyps   Colon cancer Maternal Grandmother        dx 90s    CURRENT MEDICATIONS:  Current Outpatient Medications  Medication Sig Dispense Refill   albuterol (VENTOLIN HFA) 108 (90 Base) MCG/ACT inhaler Inhale 2 puffs into the lungs every 6 (six) hours as needed for wheezing or shortness of breath. 8 g 2   amLODipine (NORVASC) 2.5 MG tablet Take 1 tablet (2.5 mg total) by mouth daily. 30 tablet 1   aspirin EC 81 MG EC tablet Take 1 tablet (81 mg total) by mouth daily at 6 (six) AM. Swallow whole. 30 tablet 11   bisacodyl (DULCOLAX) 5 MG EC tablet Take 5 mg by mouth daily as needed for moderate constipation.      carvedilol (COREG) 6.25 MG tablet Take 1 tablet (6.25 mg total) by mouth 2 (two) times daily with a meal. 60 tablet 3   famotidine (PEPCID) 20 MG tablet Take 20 mg by mouth daily as needed for heartburn or indigestion.     feeding supplement (ENSURE ENLIVE / ENSURE PLUS) LIQD Take 237 mLs by mouth 3 (three) times daily between meals. (Patient taking differently: Take 237 mLs by mouth 3 (three) times daily as needed (nutrition).) 237 mL 12   fluorouracil CALGB 63846 2,400 mg/m2 in sodium chloride 0.9 % 150 mL Inject 2,400 mg/m2 into the vein over 48 hr. Every 14 days     FLUOROURACIL IV Inject into the vein every 14 (fourteen) days.     LEUCOVORIN  CALCIUM IV Inject into the vein every 14 (fourteen) days.     loperamide (IMODIUM A-D) 2 MG tablet Take 1 tablet (2 mg total) by mouth 4 (four) times daily as needed for diarrhea or loose stools.  0   Multiple Vitamins-Minerals (MULTIVITAMIN WITH MINERALS) tablet Take 1 tablet by mouth daily. Centrum Silver for Men 50+     nicotine (NICODERM CQ - DOSED IN MG/24 HOURS) 21 mg/24hr patch Place 21 mg onto the skin daily.     OXALIPLATIN IV Inject into the vein every 14 (fourteen) days.     simvastatin (ZOCOR) 20 MG tablet Take 20 mg by mouth daily.     No current facility-administered medications for this visit.   Facility-Administered Medications Ordered in Other Visits  Medication Dose Route Frequency Provider Last Rate Last Admin   magnesium sulfate 2 GM/50ML IVPB            palonosetron (ALOXI) 0.25 MG/5ML injection            sodium chloride flush (NS) 0.9 % injection 10 mL  10 mL Intracatheter PRN Derek Jack, MD   10 mL at 11/18/21 1257    ALLERGIES:  Allergies  Allergen Reactions   Lisinopril Anaphylaxis   Naproxen Shortness Of Breath and Palpitations   Nsaids Shortness Of Breath and Palpitations    PHYSICAL EXAM:  Performance status (ECOG): 1 - Symptomatic but completely ambulatory  There were no vitals filed for this  visit.   Wt Readings from Last 3 Encounters:  11/16/21 168 lb 12.8 oz (76.6 kg)  11/09/21 166 lb (75.3 kg)  11/02/21 162 lb 6.4 oz (73.7 kg)   Physical Exam Vitals reviewed.  Constitutional:      Appearance: Normal appearance.  Cardiovascular:     Rate and Rhythm: Normal rate and regular rhythm.     Pulses: Normal pulses.     Heart sounds: Normal heart sounds.  Pulmonary:     Effort: Pulmonary effort is normal.     Breath sounds: Normal breath sounds.  Neurological:     General: No focal deficit present.     Mental Status: He is alert and oriented to person, place, and time.  Psychiatric:        Mood and Affect: Mood normal.        Behavior: Behavior normal.      LABORATORY DATA:  I have reviewed the labs as listed.     Latest Ref Rng & Units 11/30/2021    9:23 AM 11/16/2021    8:27 AM 11/02/2021   10:11 AM  CBC  WBC 4.0 - 10.5 K/uL 4.9  5.1  5.6   Hemoglobin 13.0 - 17.0 g/dL 11.9  12.1  13.0   Hematocrit 39.0 - 52.0 % 35.7  37.1  38.9   Platelets 150 - 400 K/uL 135  171  147       Latest Ref Rng & Units 11/16/2021    8:27 AM 11/02/2021   10:11 AM 10/19/2021    9:01 AM  CMP  Glucose 70 - 99 mg/dL 89  129  96   BUN 8 - 23 mg/dL 25  47  22   Creatinine 0.61 - 1.24 mg/dL 1.68  2.32  1.51   Sodium 135 - 145 mmol/L 142  138  139   Potassium 3.5 - 5.1 mmol/L 4.4  5.0  4.5   Chloride 98 - 111 mmol/L 113  109  113   CO2 22 - 32 mmol/L 21  21  20   Calcium 8.9 - 10.3 mg/dL 8.9  9.4  8.9   Total Protein 6.5 - 8.1 g/dL 7.4  8.0  7.5   Total Bilirubin 0.3 - 1.2 mg/dL 0.5  0.6  0.5   Alkaline Phos 38 - 126 U/L 114  128  109   AST 15 - 41 U/L 22  43  24   ALT 0 - 44 U/L 19  29  19      DIAGNOSTIC IMAGING:  I have independently reviewed the scans and discussed with the patient. CUP PACEART REMOTE DEVICE CHECK  Result Date: 11/10/2021 Scheduled remote reviewed. Normal device function.  Next remote 91 days. LA  VAS [place] EVAR DUPLEX  Result Date: 11/09/2021 Endovascular  Aortic Repair Study (EVAR) Patient Name:  CALYX HAWKER  Date of Exam:   11/09/2021 Medical Rec #: [id number]         Accession #:    [id number] Date of Birth: [birth year]         Patient Gender: M Patient Age:   69 years Exam Location:  [facility] Procedure:      VAS [place] EVAR DUPLEX Referring Phys: Monica Martinez --------------------------------------------------------------------------------  Indications: Follow up exam for EVAR. Surgery date 02/26/21. Risk Factors: Hypertension, current smoker. Vascular Interventions: EVAR placed 02/26/21 by Dr Carlis Abbott. Limitations: Air/bowel gas.  Performing Technologist: Elta Guadeloupe RVT, RDMS  Examination Guidelines: A complete evaluation includes B-mode imaging, spectral Doppler, color Doppler, and power Doppler as needed of all accessible portions of each vessel. Bilateral testing is considered an integral part of a complete examination. Limited examinations for reoccurring indications may be performed as noted.  Endovascular Aortic Repair (EVAR): +----------+----------------+-------------------+-------------------+           Diameter AP (cm)Diameter Trans (cm)Velocities (cm/sec) +----------+----------------+-------------------+-------------------+ Aorta     5.00            5.00               34                  +----------+----------------+-------------------+-------------------+ Right Limb4.10            2.80               41                  +----------+----------------+-------------------+-------------------+ Left Limb 2.90            2.20               40                  +----------+----------------+-------------------+-------------------+  Summary: Abdominal Aorta: 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  *See table(s) above for measurements and observations.  Electronically signed by Monica Martinez MD on 11/09/2021 at 8:25:59 AM.    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 - Colonoscopy on 03/12/2021: Fungating partially obstructing large mass found at 40 cm proximal to the anus, circumferential with no bleeding present. - CEA on 03/12/2021: 8.3 - CT angio abdomen and pelvis on 04/08/2021: No evidence of metastatic lymphadenopathy or liver mets.  Stable 7 mm subpleural pulmonary nodule in the periphery of the right lower lobe. - CT angio CAP on 02/04/2021: No evidence of lung metastasis, abdominal metastasis. - Resection of splenic flexure mass on 04/23/2021 by Dr. Marcello Moores. - Pathology: Moderately differentiated adenocarcinoma, invades into  subserosa, margins negative, 0/38 lymph nodes.  No LVI or perineural invasion.  PT3 pN0.  MSI-stable. - Guardant reveal (05/21/2021): CT DNA was detected. - PET scan on 06/10/2021 shows 2 cm hypermetabolic soft tissue density in the transverse colon.  No evidence of local or distant metastatic disease.    Social/family history: - He is a retired [employer]. - Current active smoker, half pack per day for 50 years. - Mother had colon cancer at age 55.  Maternal grandmother had colon cancer.  3.  Stage III (H3SC3) right colon adenocarcinoma: - Right colectomy on 08/10/2021 by Dr. Marcello Moores. - Pathology with grade 2 moderately differentiated colonic adenocarcinoma, tumor site ascending colon, margins negative, 1/22 lymph nodes involved, [id number].  MMR preserved.        -Cycle 1 of FOLFOX started on 10/05/2021   PLAN:  Stage III (T4AN1) right colon adenocarcinoma, MMR preserved: - Cycle 3 of FOLFOX on 11/16/2021. - He has tolerated last cycle of chemotherapy reasonably well. - Reviewed labs today which showed creatinine 1.63 and stable.  LFTs are normal.  White count is adequate.  Platelet count is 135. - Proceed with cycle 4 with 20% dose reduction. - He has mild hyper kalemia.  Will give Kayexalate 30 g p.o. x1. - I have recommended to discontinue multivitamin as it may contain potassium.   RTC 2 weeks for follow-up.  2.  Normocytic anemia: -Combination anemia from CKD and relative iron deficiency and myelosuppression. - Hemoglobin today is 11.9.  3.  Chemotherapy-induced diarrhea: - Continue to use Imodium as needed.   Orders placed this encounter:  No orders of the defined types were placed in this encounter.    Derek Jack, MD [place] [phone]

## 2021-11-30 NOTE — Progress Notes (Signed)
Patient has been examined by Dr. Delton Coombes, and vital signs and labs have been reviewed. ANC, Creatinine, LFTs, hemoglobin, and platelets are within treatment parameters per M.D. - pt may proceed with treatment w/20% dose reduction. Primary RN and pharmacy notified.

## 2021-11-30 NOTE — Patient Instructions (Signed)
[facility]  Discharge Instructions: Thank you for choosing [facility] to provide your oncology and hematology care.  If you have a lab appointment with the Cancer Center, please come in thru the Main Entrance and check in at the main information desk.  Wear comfortable clothing and clothing appropriate for easy access to any Portacath or PICC line.   We strive to give you quality time with your provider. You may need to reschedule your appointment if you arrive late (15 or more minutes).  Arriving late affects you and other patients whose appointments are after yours.  Also, if you miss three or more appointments without notifying the office, you may be dismissed from the clinic at the provider's discretion.      For prescription refill requests, have your pharmacy contact our office and allow 72 hours for refills to be completed.    Today you received the following chemotherapy and/or immunotherapy agents Folfox   To help prevent nausea and vomiting after your treatment, we encourage you to take your nausea medication as directed.   Oxaliplatin Injection What is this medication? OXALIPLATIN (ox AL i PLA tin) treats some types of cancer. It works by slowing down the growth of cancer cells. This medicine may be used for other purposes; ask your health care provider or pharmacist if you have questions. COMMON BRAND NAME(S): Eloxatin What should I tell my care team before I take this medication? They need to know if you have any of these conditions: Heart disease History of irregular heartbeat or rhythm Liver disease Low blood cell levels (white cells, red cells, and platelets) Lung or breathing disease, such as asthma Take medications that treat or prevent blood clots Tingling of the fingers, toes, or other nerve disorder An unusual or allergic reaction to oxaliplatin, other medications, foods, dyes, or preservatives If you or your partner are  pregnant or trying to get pregnant Breast-feeding How should I use this medication? This medication is injected into a vein. It is given by your care team in a hospital or clinic setting. Talk to your care team about the use of this medication in children. Special care may be needed. Overdosage: If you think you have taken too much of this medicine contact a poison control center or emergency room at once. NOTE: This medicine is only for you. Do not share this medicine with others. What if I miss a dose? Keep appointments for follow-up doses. It is important not to miss a dose. Call your care team if you are unable to keep an appointment. What may interact with this medication? Do not take this medication with any of the following: Cisapride Dronedarone Pimozide Thioridazine This medication may also interact with the following: Aspirin and aspirin-like medications Certain medications that treat or prevent blood clots, such as warfarin, apixaban, dabigatran, and rivaroxaban Cisplatin Cyclosporine Diuretics Medications for infection, such as acyclovir, adefovir, amphotericin B, bacitracin, cidofovir, foscarnet, ganciclovir, gentamicin, pentamidine, vancomycin NSAIDs, medications for pain and inflammation, such as ibuprofen or naproxen Other medications that cause heart rhythm changes Pamidronate Zoledronic acid This list may not describe all possible interactions. Give your health care provider a list of all the medicines, herbs, non-prescription drugs, or dietary supplements you use. Also tell them if you smoke, drink alcohol, or use illegal drugs. Some items may interact with your medicine. What should I watch for while using this medication? Your condition will be monitored carefully while you are receiving this medication. You may   need blood work while taking this medication. This medication may make you feel generally unwell. This is not uncommon as chemotherapy can affect healthy  cells as well as cancer cells. Report any side effects. Continue your course of treatment even though you feel ill unless your care team tells you to stop. This medication may increase your risk of getting an infection. Call your care team for advice if you get a fever, chills, sore throat, or other symptoms of a cold or flu. Do not treat yourself. Try to avoid being around people who are sick. Avoid taking medications that contain aspirin, acetaminophen, ibuprofen, naproxen, or ketoprofen unless instructed by your care team. These medications may hide a fever. Be careful brushing or flossing your teeth or using a toothpick because you may get an infection or bleed more easily. If you have any dental work done, tell your dentist you are receiving this medication. This medication can make you more sensitive to cold. Do not drink cold drinks or use ice. Cover exposed skin before coming in contact with cold temperatures or cold objects. When out in cold weather wear warm clothing and cover your mouth and nose to warm the air that goes into your lungs. Tell your care team if you get sensitive to the cold. Talk to your care team if you or your partner are pregnant or think either of you might be pregnant. This medication can cause serious birth defects if taken during pregnancy and for 9 months after the last dose. A negative pregnancy test is required before starting this medication. A reliable form of contraception is recommended while taking this medication and for 9 months after the last dose. Talk to your care team about effective forms of contraception. Do not father a child while taking this medication and for 6 months after the last dose. Use a condom while having sex during this time period. Do not breastfeed while taking this medication and for 3 months after the last dose. This medication may cause infertility. Talk to your care team if you are concerned about your fertility. What side effects may I  notice from receiving this medication? Side effects that you should report to your care team as soon as possible: Allergic reactions--skin rash, itching, hives, swelling of the face, lips, tongue, or throat Bleeding--bloody or black, tar-like stools, vomiting blood or brown material that looks like coffee grounds, red or dark brown urine, small red or purple spots on skin, unusual bruising or bleeding Dry cough, shortness of breath or trouble breathing Heart rhythm changes--fast or irregular heartbeat, dizziness, feeling faint or lightheaded, chest pain, trouble breathing Infection--fever, chills, cough, sore throat, wounds that don't heal, pain or trouble when passing urine, general feeling of discomfort or being unwell Liver injury--right upper belly pain, loss of appetite, nausea, light-colored stool, dark yellow or brown urine, yellowing skin or eyes, unusual weakness or fatigue Low red blood cell level--unusual weakness or fatigue, dizziness, headache, trouble breathing Muscle injury--unusual weakness or fatigue, muscle pain, dark yellow or brown urine, decrease in amount of urine Pain, tingling, or numbness in the hands or feet Sudden and severe headache, confusion, change in vision, seizures, which may be signs of posterior reversible encephalopathy syndrome (PRES) Unusual bruising or bleeding Side effects that usually do not require medical attention (report to your care team if they continue or are bothersome): Diarrhea Nausea Pain, redness, or swelling with sores inside the mouth or throat Unusual weakness or fatigue Vomiting This list may not describe   possible side effects. Call your doctor for medical advice about side effects. You may report side effects to FDA at 1-800-FDA-1088. Where should I keep my medication? This medication is given in a hospital or clinic. It will not be stored at home. NOTE: This sheet is a summary. It may not cover all possible information. If you have questions  about this medicine, talk to your doctor, pharmacist, or health care provider.  2023 Elsevier/Gold Standard (2021-06-04 00:00:00)  Leucovorin Injection What is this medication? LEUCOVORIN (loo koe VOR in) prevents side effects from certain medications, such as methotrexate. It works by increasing folate levels. This helps protect healthy cells in your body. It may also be used to treat anemia caused by low levels of folate. It can also be used with fluorouracil, a type of chemotherapy, to treat colorectal cancer. It works by increasing the effects of fluorouracil in the body. This medicine may be used for other purposes; ask your health care provider or pharmacist if you have questions. What should I tell my care team before I take this medication? They need to know if you have any of these conditions: Anemia from low levels of vitamin B12 in the blood An unusual or allergic reaction to leucovorin, folic acid, other medications, foods, dyes, or preservatives Pregnant or trying to get pregnant Breastfeeding How should I use this medication? This medication is injected into a vein or a muscle. It is given by your care team in a hospital or clinic setting. Talk to your care team about the use of this medication in children. Special care may be needed. Overdosage: If you think you have taken too much of this medicine contact a poison control center or emergency room at once. NOTE: This medicine is only for you. Do not share this medicine with others. What if I miss a dose? Keep appointments for follow-up doses. It is important not to miss your dose. Call your care team if you are unable to keep an appointment. What may interact with this medication? Capecitabine Fluorouracil Phenobarbital Phenytoin Primidone Trimethoprim;sulfamethoxazole This list may not describe all possible interactions. Give your health care provider a list of all the medicines, herbs, non-prescription drugs, or dietary  supplements you use. Also tell them if you smoke, drink alcohol, or use illegal drugs. Some items may interact with your medicine. What should I watch for while using this medication? Your condition will be monitored carefully while you are receiving this medication. This medication may increase the side effects of 5-fluorouracil. Tell your care team if you have diarrhea or mouth sores that do not get better or that get worse. What side effects may I notice from receiving this medication? Side effects that you should report to your care team as soon as possible: Allergic reactions--skin rash, itching, hives, swelling of the face, lips, tongue, or throat This list may not describe all possible side effects. Call your doctor for medical advice about side effects. You may report side effects to FDA at 1-800-FDA-1088. Where should I keep my medication? This medication is given in a hospital or clinic. It will not be stored at home. NOTE: This sheet is a summary. It may not cover all possible information. If you have questions about this medicine, talk to your doctor, pharmacist, or health care provider.  2023 Elsevier/Gold Standard (2021-06-18 00:00:00)  Fluorouracil Injection What is this medication? FLUOROURACIL (flure oh YOOR a sil) treats some types of cancer. It works by slowing down the growth of cancer cells.  cells. This medicine may be used for other purposes; ask your health care provider or pharmacist if you have questions. COMMON BRAND NAME(S): Adrucil What should I tell my care team before I take this medication? They need to know if you have any of these conditions: Blood disorders Dihydropyrimidine dehydrogenase (DPD) deficiency Infection, such as chickenpox, cold sores, herpes Kidney disease Liver disease Poor nutrition Recent or ongoing radiation therapy An unusual or allergic reaction to fluorouracil, other medications, foods, dyes, or preservatives If you or your partner  are pregnant or trying to get pregnant Breast-feeding How should I use this medication? This medication is injected into a vein. It is administered by your care team in a hospital or clinic setting. Talk to your care team about the use of this medication in children. Special care may be needed. Overdosage: If you think you have taken too much of this medicine contact a poison control center or emergency room at once. NOTE: This medicine is only for you. Do not share this medicine with others. What if I miss a dose? Keep appointments for follow-up doses. It is important not to miss your dose. Call your care team if you are unable to keep an appointment. What may interact with this medication? Do not take this medication with any of the following: Live virus vaccines This medication may also interact with the following: Medications that treat or prevent blood clots, such as warfarin, enoxaparin, dalteparin This list may not describe all possible interactions. Give your health care provider a list of all the medicines, herbs, non-prescription drugs, or dietary supplements you use. Also tell them if you smoke, drink alcohol, or use illegal drugs. Some items may interact with your medicine. What should I watch for while using this medication? Your condition will be monitored carefully while you are receiving this medication. This medication may make you feel generally unwell. This is not uncommon as chemotherapy can affect healthy cells as well as cancer cells. Report any side effects. Continue your course of treatment even though you feel ill unless your care team tells you to stop. In some cases, you may be given additional medications to help with side effects. Follow all directions for their use. This medication may increase your risk of getting an infection. Call your care team for advice if you get a fever, chills, sore throat, or other symptoms of a cold or flu. Do not treat yourself. Try to  avoid being around people who are sick. This medication may increase your risk to bruise or bleed. Call your care team if you notice any unusual bleeding. Be careful brushing or flossing your teeth or using a toothpick because you may get an infection or bleed more easily. If you have any dental work done, tell your dentist you are receiving this medication. Avoid taking medications that contain aspirin, acetaminophen, ibuprofen, naproxen, or ketoprofen unless instructed by your care team. These medications may hide a fever. Do not treat diarrhea with over the counter products. Contact your care team if you have diarrhea that lasts more than 2 days or if it is severe and watery. This medication can make you more sensitive to the sun. Keep out of the sun. If you cannot avoid being in the sun, wear protective clothing and sunscreen. Do not use sun lamps, tanning beds, or tanning booths. Talk to your care team if you or your partner wish to become pregnant or think you might be pregnant. This medication can cause serious birth defects   taken during pregnancy and for 3 months after the last dose. A reliable form of contraception is recommended while taking this medication and for 3 months after the last dose. Talk to your care team about effective forms of contraception. Do not father a child while taking this medication and for 3 months after the last dose. Use a condom while having sex during this time period. Do not breastfeed while taking this medication. This medication may cause infertility. Talk to your care team if you are concerned about your fertility. What side effects may I notice from receiving this medication? Side effects that you should report to your care team as soon as possible: Allergic reactions--skin rash, itching, hives, swelling of the face, lips, tongue, or throat Heart attack--pain or tightness in the chest, shoulders, arms, or jaw, nausea, shortness of breath, cold or clammy skin,  feeling faint or lightheaded Heart failure--shortness of breath, swelling of the ankles, feet, or hands, sudden weight gain, unusual weakness or fatigue Heart rhythm changes--fast or irregular heartbeat, dizziness, feeling faint or lightheaded, chest pain, trouble breathing High ammonia level--unusual weakness or fatigue, confusion, loss of appetite, nausea, vomiting, seizures Infection--fever, chills, cough, sore throat, wounds that don't heal, pain or trouble when passing urine, general feeling of discomfort or being unwell Low red blood cell level--unusual weakness or fatigue, dizziness, headache, trouble breathing Pain, tingling, or numbness in the hands or feet, muscle weakness, change in vision, confusion or trouble speaking, loss of balance or coordination, trouble walking, seizures Redness, swelling, and blistering of the skin over hands and feet Severe or prolonged diarrhea Unusual bruising or bleeding Side effects that usually do not require medical attention (report to your care team if they continue or are bothersome): Dry skin Headache Increased tears Nausea Pain, redness, or swelling with sores inside the mouth or throat Sensitivity to light Vomiting This list may not describe all possible side effects. Call your doctor for medical advice about side effects. You may report side effects to FDA at 1-800-FDA-1088. Where should I keep my medication? This medication is given in a hospital or clinic. It will not be stored at home. NOTE: This sheet is a summary. It may not cover all possible information. If you have questions about this medicine, talk to your doctor, pharmacist, or health care provider.  2023 Elsevier/Gold Standard (2021-06-15 00:00:00)    BELOW ARE SYMPTOMS THAT SHOULD BE REPORTED IMMEDIATELY: *FEVER GREATER THAN 100.4 F (38 C) OR HIGHER *CHILLS OR SWEATING *NAUSEA AND VOMITING THAT IS NOT CONTROLLED WITH YOUR NAUSEA MEDICATION *UNUSUAL SHORTNESS OF  BREATH *UNUSUAL BRUISING OR BLEEDING *URINARY PROBLEMS (pain or burning when urinating, or frequent urination) *BOWEL PROBLEMS (unusual diarrhea, constipation, pain near the anus) TENDERNESS IN MOUTH AND THROAT WITH OR WITHOUT PRESENCE OF ULCERS (sore throat, sores in mouth, or a toothache) UNUSUAL RASH, SWELLING OR PAIN  UNUSUAL VAGINAL DISCHARGE OR ITCHING   Items with * indicate a potential emergency and should be followed up as soon as possible or go to the Emergency Department if any problems should occur.  Please show the CHEMOTHERAPY ALERT CARD or IMMUNOTHERAPY ALERT CARD at check-in to the Emergency Department and triage nurse.  Should you have questions after your visit or need to cancel or reschedule your appointment, please contact [facility] [phone]  and follow the prompts.  Office hours are 8:00 a.m. to 4:30 p.m. Monday - Friday. Please note that voicemails left after 4:00 p.m. may not be returned until the following business  day.  We are closed weekends and major holidays. You have access to a nurse at all times for urgent questions. Please call the main number to the clinic [phone] and follow the prompts.  For any non-urgent questions, you may also contact your provider using MyChart. We now offer e-Visits for anyone 94 and older to request care online for non-urgent symptoms. For details visit [email or web address].   Also download the MyChart app! Go to the app store, search "MyChart", open the app, select [facility], and log in with your MyChart username and password.  Masks are optional in the cancer centers. If you would like for your care team to wear a mask while they are taking care of you, please let them know. You may have one support person who is at least 71 years old accompany you for your appointments.

## 2021-12-02 ENCOUNTER — Inpatient Hospital Stay: Payer: Medicare Other

## 2021-12-02 DIAGNOSIS — Z5111 Encounter for antineoplastic chemotherapy: Secondary | ICD-10-CM | POA: Diagnosis not present

## 2021-12-02 DIAGNOSIS — D631 Anemia in chronic kidney disease: Secondary | ICD-10-CM | POA: Diagnosis not present

## 2021-12-02 DIAGNOSIS — C183 Malignant neoplasm of hepatic flexure: Secondary | ICD-10-CM

## 2021-12-02 DIAGNOSIS — Z452 Encounter for adjustment and management of vascular access device: Secondary | ICD-10-CM | POA: Diagnosis not present

## 2021-12-02 DIAGNOSIS — R911 Solitary pulmonary nodule: Secondary | ICD-10-CM | POA: Diagnosis not present

## 2021-12-02 DIAGNOSIS — R197 Diarrhea, unspecified: Secondary | ICD-10-CM | POA: Diagnosis not present

## 2021-12-02 DIAGNOSIS — Z95828 Presence of other vascular implants and grafts: Secondary | ICD-10-CM

## 2021-12-02 DIAGNOSIS — N189 Chronic kidney disease, unspecified: Secondary | ICD-10-CM | POA: Diagnosis not present

## 2021-12-02 MED ORDER — SODIUM CHLORIDE 0.9% FLUSH
10.0000 mL | INTRAVENOUS | Status: DC | PRN
  Administered 2021-12-02: 10 mL via INTRAVENOUS

## 2021-12-02 MED ORDER — HEPARIN SOD (PORK) LOCK FLUSH 100 UNIT/ML IV SOLN
500.0000 [IU] | Freq: Once | INTRAVENOUS | Status: AC
  Administered 2021-12-02: 500 [IU] via INTRAVENOUS

## 2021-12-02 NOTE — Patient Instructions (Signed)
[facility]  Discharge Instructions: Thank you for choosing [facility] to provide your oncology and hematology care.  If you have a lab appointment with the [facility], please come in thru the Main Entrance and check in at the main information desk.  Wear comfortable clothing and clothing appropriate for easy access to any Portacath or PICC line.   We strive to give you quality time with your provider. You may need to reschedule your appointment if you arrive late (15 or more minutes).  Arriving late affects you and other patients whose appointments are after yours.  Also, if you miss three or more appointments without notifying the office, you may be dismissed from the clinic at the provider's discretion.      For prescription refill requests, have your pharmacy contact our office and allow 72 hours for refills to be completed.    Today you received 5FU chemotherapy pump d/c     BELOW ARE SYMPTOMS THAT SHOULD BE REPORTED IMMEDIATELY: *FEVER GREATER THAN 100.4 F (38 C) OR HIGHER *CHILLS OR SWEATING *NAUSEA AND VOMITING THAT IS NOT CONTROLLED WITH YOUR NAUSEA MEDICATION *UNUSUAL SHORTNESS OF BREATH *UNUSUAL BRUISING OR BLEEDING *URINARY PROBLEMS (pain or burning when urinating, or frequent urination) *BOWEL PROBLEMS (unusual diarrhea, constipation, pain near the anus) TENDERNESS IN MOUTH AND THROAT WITH OR WITHOUT PRESENCE OF ULCERS (sore throat, sores in mouth, or a toothache) UNUSUAL RASH, SWELLING OR PAIN  UNUSUAL VAGINAL DISCHARGE OR ITCHING   Items with * indicate a potential emergency and should be followed up as soon as possible or go to the Emergency Department if any problems should occur.  Please show the CHEMOTHERAPY ALERT CARD or IMMUNOTHERAPY ALERT CARD at check-in to the Emergency Department and triage nurse.  Should you have questions after your visit or need to cancel or reschedule your appointment, please contact [facility] [phone]  and follow the prompts.  Office hours are 8:00 a.m. to 4:30 p.m. Monday - Friday. Please note that voicemails left after 4:00 p.m. may not be returned until the following business day.  We are closed weekends and major holidays. You have access to a nurse at all times for urgent questions. Please call the main number to the clinic [phone] and follow the prompts.  For any non-urgent questions, you may also contact your provider using MyChart. We now offer e-Visits for anyone 53 and older to request care online for non-urgent symptoms. For details visit [email or web address].   Also download the MyChart app! Go to the app store, search "MyChart", open the app, select [facility], and log in with your MyChart username and password.  Masks are optional in the cancer centers. If you would like for your care team to wear a mask while they are taking care of you, please let them know. You may have one support person who is at least 71 years old accompany you for your appointments.

## 2021-12-02 NOTE — Progress Notes (Signed)
Pt presents today for 5FU chemotherapy pump disconnection per provider's order. Vital signs stable, port flushed easily without diffculty with 10 mL of normal saline and 5 mL of heparin. Good blood return and needle removed intact. No bruising or swelling noted at the site.  Discharged from clinic ambulatory in stable condition. Alert and oriented x 3. F/U with [facility] as scheduled.

## 2021-12-05 DIAGNOSIS — C183 Malignant neoplasm of hepatic flexure: Secondary | ICD-10-CM | POA: Diagnosis not present

## 2021-12-13 ENCOUNTER — Other Ambulatory Visit: Payer: Self-pay | Admitting: Hematology

## 2021-12-13 DIAGNOSIS — C183 Malignant neoplasm of hepatic flexure: Secondary | ICD-10-CM

## 2021-12-14 ENCOUNTER — Inpatient Hospital Stay (HOSPITAL_BASED_OUTPATIENT_CLINIC_OR_DEPARTMENT_OTHER): Payer: Medicare Other | Admitting: Hematology

## 2021-12-14 ENCOUNTER — Inpatient Hospital Stay: Payer: Medicare Other

## 2021-12-14 VITALS — BP 156/88 | HR 71 | Temp 97.2°F | Resp 18

## 2021-12-14 DIAGNOSIS — R911 Solitary pulmonary nodule: Secondary | ICD-10-CM | POA: Diagnosis not present

## 2021-12-14 DIAGNOSIS — C183 Malignant neoplasm of hepatic flexure: Secondary | ICD-10-CM

## 2021-12-14 DIAGNOSIS — D631 Anemia in chronic kidney disease: Secondary | ICD-10-CM | POA: Diagnosis not present

## 2021-12-14 DIAGNOSIS — Z5111 Encounter for antineoplastic chemotherapy: Secondary | ICD-10-CM | POA: Diagnosis not present

## 2021-12-14 DIAGNOSIS — R197 Diarrhea, unspecified: Secondary | ICD-10-CM | POA: Diagnosis not present

## 2021-12-14 DIAGNOSIS — Z95828 Presence of other vascular implants and grafts: Secondary | ICD-10-CM

## 2021-12-14 DIAGNOSIS — Z452 Encounter for adjustment and management of vascular access device: Secondary | ICD-10-CM | POA: Diagnosis not present

## 2021-12-14 DIAGNOSIS — N189 Chronic kidney disease, unspecified: Secondary | ICD-10-CM | POA: Diagnosis not present

## 2021-12-14 LAB — CBC WITH DIFFERENTIAL/PLATELET
Abs Immature Granulocytes: 0.01 10*3/uL (ref 0.00–0.07)
Basophils Absolute: 0 10*3/uL (ref 0.0–0.1)
Basophils Relative: 1 %
Eosinophils Absolute: 0.1 10*3/uL (ref 0.0–0.5)
Eosinophils Relative: 2 %
HCT: 37.8 % — ABNORMAL LOW (ref 39.0–52.0)
Hemoglobin: 12.6 g/dL — ABNORMAL LOW (ref 13.0–17.0)
Immature Granulocytes: 0 %
Lymphocytes Relative: 41 %
Lymphs Abs: 2.1 10*3/uL (ref 0.7–4.0)
MCH: 33.1 pg (ref 26.0–34.0)
MCHC: 33.3 g/dL (ref 30.0–36.0)
MCV: 99.2 fL (ref 80.0–100.0)
Monocytes Absolute: 0.7 10*3/uL (ref 0.1–1.0)
Monocytes Relative: 14 %
Neutro Abs: 2.2 10*3/uL (ref 1.7–7.7)
Neutrophils Relative %: 42 %
Platelets: 131 10*3/uL — ABNORMAL LOW (ref 150–400)
RBC: 3.81 MIL/uL — ABNORMAL LOW (ref 4.22–5.81)
RDW: 16 % — ABNORMAL HIGH (ref 11.5–15.5)
WBC: 5.2 10*3/uL (ref 4.0–10.5)
nRBC: 0 % (ref 0.0–0.2)

## 2021-12-14 LAB — COMPREHENSIVE METABOLIC PANEL
ALT: 17 U/L (ref 0–44)
AST: 25 U/L (ref 15–41)
Albumin: 3.5 g/dL (ref 3.5–5.0)
Alkaline Phosphatase: 113 U/L (ref 38–126)
Anion gap: 8 (ref 5–15)
BUN: 17 mg/dL (ref 8–23)
CO2: 19 mmol/L — ABNORMAL LOW (ref 22–32)
Calcium: 9 mg/dL (ref 8.9–10.3)
Chloride: 111 mmol/L (ref 98–111)
Creatinine, Ser: 1.55 mg/dL — ABNORMAL HIGH (ref 0.61–1.24)
GFR, Estimated: 48 mL/min — ABNORMAL LOW (ref >60)
Glucose, Bld: 124 mg/dL — ABNORMAL HIGH (ref 70–99)
Potassium: 4.3 mmol/L (ref 3.5–5.1)
Sodium: 138 mmol/L (ref 135–145)
Total Bilirubin: 0.7 mg/dL (ref 0.3–1.2)
Total Protein: 7.3 g/dL (ref 6.5–8.1)

## 2021-12-14 MED ORDER — FLUOROURACIL CHEMO INJECTION 2.5 GM/50ML
320.0000 mg/m2 | Freq: Once | INTRAVENOUS | Status: AC
  Administered 2021-12-14: 650 mg via INTRAVENOUS
  Filled 2021-12-14: qty 13

## 2021-12-14 MED ORDER — SODIUM CHLORIDE 0.9 % IV SOLN
1920.0000 mg/m2 | INTRAVENOUS | Status: DC
  Administered 2021-12-14: 3750 mg via INTRAVENOUS
  Filled 2021-12-14: qty 75

## 2021-12-14 MED ORDER — SODIUM CHLORIDE 0.9% FLUSH
10.0000 mL | Freq: Once | INTRAVENOUS | Status: AC
  Administered 2021-12-14: 10 mL via INTRAVENOUS

## 2021-12-14 MED ORDER — DEXTROSE 5 % IV SOLN: Freq: Once | INTRAVENOUS | Status: AC

## 2021-12-14 MED ORDER — PALONOSETRON HCL INJECTION 0.25 MG/5ML
0.2500 mg | Freq: Once | INTRAVENOUS | Status: AC
  Administered 2021-12-14: 0.25 mg via INTRAVENOUS
  Filled 2021-12-14: qty 5

## 2021-12-14 MED ORDER — OXALIPLATIN CHEMO INJECTION 100 MG/20ML
68.0000 mg/m2 | Freq: Once | INTRAVENOUS | Status: AC
  Administered 2021-12-14: 135 mg via INTRAVENOUS
  Filled 2021-12-14: qty 10

## 2021-12-14 MED ORDER — LEUCOVORIN CALCIUM INJECTION 350 MG
320.0000 mg/m2 | Freq: Once | INTRAVENOUS | Status: AC
  Administered 2021-12-14: 628 mg via INTRAVENOUS
  Filled 2021-12-14: qty 31.4

## 2021-12-14 MED ORDER — SODIUM CHLORIDE 0.9 % IV SOLN
10.0000 mg | Freq: Once | INTRAVENOUS | Status: AC
  Administered 2021-12-14: 10 mg via INTRAVENOUS
  Filled 2021-12-14: qty 10

## 2021-12-14 NOTE — Progress Notes (Signed)
Pt presents today for Folfox per provider's order. Vital signs and other labs WNL for treatment. Pt's creatinine is 1.55 today. Okay to proceed with treatment today per Dr.K.  Folfox given today per MD orders. Tolerated infusion without adverse affects. Vital signs stable. No complaints at this time. Discharged from clinic ambulatory in stable condition. Alert and oriented x 3. F/U with [facility] as scheduled. 5FU ambulatory pump infusing.

## 2021-12-14 NOTE — Patient Instructions (Addendum)
[place] [facility]  Discharge Instructions  You were seen and examined today by Dr. Delton Coombes.  Proceed with treatment as planned today.  Please let Dr. Delton Coombes know immediately if you notice any numbness or tingling.  Follow-up as scheduled.  Thank you for choosing [facility] to provide your oncology and hematology care.   To afford each patient quality time with our provider, please arrive at least 15 minutes before your scheduled appointment time. You may need to reschedule your appointment if you arrive late (10 or more minutes). Arriving late affects you and other patients whose appointments are after yours.  Also, if you miss three or more appointments without notifying the office, you may be dismissed from the clinic at the provider's discretion.    Again, thank you for choosing [facility].  Our hope is that these requests will decrease the amount of time that you wait before being seen by our physicians.   If you have a lab appointment with the [facility] please come in thru the Main Entrance and check in at the main information desk.           _____________________________________________________________  Should you have questions after your visit to [facility], please contact our office at [phone] and follow the prompts.  Our office hours are 8:00 a.m. to 4:30 p.m. Monday - Thursday and 8:00 a.m. to 2:30 p.m. Friday.  Please note that voicemails left after 4:00 p.m. may not be returned until the following business day.  We are closed weekends and all major holidays.  You do have access to a nurse 24-7, just call the main number to the clinic [phone] and do not press any options, hold on the line and a nurse will answer the phone.    For prescription refill requests, have your pharmacy contact our office and allow 72 hours.    Masks are optional in the cancer centers. If you would like  for your care team to wear a mask while they are taking care of you, please let them know. You may have one support person who is at least 71 years old accompany you for your appointments.

## 2021-12-14 NOTE — Progress Notes (Signed)
Fernando Dean, Fernando Dean  PCP:  [name], FNP [address] [name] [address] [phone]   REASON FOR VISIT:  Follow-up for stage 3B right colon cancer  PRIOR THERAPY: Resection of splenic flexure mass on 04/23/2021 by Dr. Marcello Moores, right colectomy on 08/18/2021  NGS Results:  MSI-stable  CURRENT THERAPY: Surveillance   CANCER STAGING:  Cancer Staging  Cancer of left colon [facility]) Staging form: Colon and Rectum, AJCC 8th Edition - Clinical stage from 05/11/2021: Stage IIA (cT3, cN0, cM0) - Unsigned  Primary cancer of hepatic flexure s/p robotic right colectomy 08/18/2021 Staging form: Colon and Rectum, AJCC 8th Edition - Clinical stage from 09/23/2021: Stage IIIB (cT4a, cN1a, cM0) - Unsigned   INTERVAL HISTORY:  [name], a 71 y.o. male, seen for follow-up of colon cancer and toxicity assessment prior to next cycle of chemotherapy.  He received cycle 4 on 11/30/2021.  Cold sensitivity lasted 2 days.  He has intermittent diarrhea which is stable.  Dyspnea on exertion is also stable.  Energy levels are 80%.  Denies any tingling or numbness in the extremities.  REVIEW OF SYSTEMS:  Review of Systems  Respiratory:  Positive for shortness of breath (exertion).   Gastrointestinal:  Positive for diarrhea.  All other systems reviewed and are negative.   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 - St Jude/[employer] Ass[employer] MRI 2272 dual chamber PPM   Tuberculosis    as a child, was treated   Past Surgical History:  Procedure Laterality Date   ABDOMINAL  AORTIC ENDOVASCULAR STENT GRAFT Bilateral 02/26/2021   Procedure: ABDOMINAL AORTIC ENDOVASCULAR STENT GRAFT REPAIR;  Surgeon: Marty Heck, MD;  Location: [facility] OR;  Service: Vascular;  Laterality: Bilateral;   BIOPSY  03/12/2021   Procedure: BIOPSY;  Surgeon: Harvel Quale, MD;  [facility];  Service: Gastroenterology;;   BIOPSY  06/25/2021   Procedure: BIOPSY;  Surgeon: Harvel Quale, MD;  [facility];  Service: Gastroenterology;;  mass    COLONOSCOPY WITH PROPOFOL N/A 03/12/2021   Procedure: COLONOSCOPY WITH PROPOFOL;  Surgeon: Harvel Quale, MD;  [facility];  Service: Gastroenterology;  Laterality: N/A;  10:55   COLONOSCOPY WITH PROPOFOL N/A 06/25/2021   Procedure: COLONOSCOPY WITH PROPOFOL;  Surgeon: Harvel Quale, MD;  [facility];  Service: Gastroenterology;  Laterality: N/A;  235 ASA 2   HEMOSTASIS CLIP PLACEMENT  03/12/2021   Procedure: HEMOSTASIS CLIP PLACEMENT;  Surgeon: Harvel Quale, MD;  [facility];  Service: Gastroenterology;;   HEMOSTASIS CLIP PLACEMENT  06/25/2021   Procedure: HEMOSTASIS CLIP PLACEMENT;  Surgeon: Harvel Quale, MD;  [facility];  Service: Gastroenterology;;   HERNIA REPAIR Left 10/25/2006   IR IMAGING GUIDED PORT INSERTION  09/29/2021   PACEMAKER IMPLANT N/A 02/08/2021   Procedure: PACEMAKER IMPLANT;  Surgeon: Deboraha Sprang, MD;  Location: [facility];  Service: Cardiovascular;  Laterality: N/A;   POLYPECTOMY  03/12/2021   Procedure: POLYPECTOMY;  Surgeon: [name], [name], MD;  [facility];  Service: Gastroenterology;;   POLYPECTOMY  06/25/2021   Procedure: POLYPECTOMY;  Surgeon: Harvel Quale, MD;  [facility];  Service: Gastroenterology;;   [name]  03/12/2021   Procedure: [name];  Surgeon: [name], [name], MD;  [facility];  Service:  Gastroenterology;;    SOCIAL HISTORY:  Social History   Socioeconomic History   Marital status: Single    Spouse name: Not on file   Number of children: Not on file   Years of education: Not on file   Highest education level: Not on file  Occupational History   Not on file  Tobacco Use   Smoking status: Every Day    Packs/day: 0.75    Years: 50.00    Total pack years: 37.50    Types: Cigarettes    Passive exposure: Current   Smokeless tobacco: Never  Vaping Use   Vaping Use: Never used  Substance and Sexual Activity   Alcohol use: Yes    Alcohol/week: 2.0 - 3.0 standard drinks of alcohol    Types: 2 - 3 Shots of liquor per week    Comment: daily liquor moderatly   Drug use: Not on file    Comment: uses every other day   Sexual activity: Not Currently  Other Topics Concern   Not on file  Social History Narrative   Not on file   Social Determinants of Health   Financial Resource Strain: High Risk (10/14/2021)   Overall Financial Resource Strain (CARDIA)    Difficulty of Paying Living Expenses: Hard  Food Insecurity: Not on file  Transportation Needs: Not on file  Physical Activity: Not on file  Stress: Not on file  Social Connections: Not on file  Intimate Partner Violence: Not on file    FAMILY HISTORY:  Family History  Problem Relation Age of Onset   Heart disease Father    Colon polyps Sister        less than 10 lifetime polyps   Colon cancer Maternal Grandmother        dx 90s    CURRENT MEDICATIONS:  Current Outpatient Medications  Medication Sig Dispense Refill   albuterol (VENTOLIN HFA) 108 (90 Base) MCG/ACT inhaler Inhale 2 puffs into the lungs every 6 (six) hours as needed for wheezing or shortness of breath. 8 g 2   amLODipine (NORVASC) 2.5 MG tablet TAKE 1 TABLET(2.5 MG) BY MOUTH DAILY 90 tablet 3   aspirin EC 81 MG EC tablet Take 1 tablet (81 mg total) by mouth daily at 6 (six) AM. Swallow whole. 30 tablet 11   bisacodyl (DULCOLAX) 5 MG EC  tablet Take 5 mg by mouth daily as needed for moderate constipation.     carvedilol (COREG) 6.25 MG tablet Take 1 tablet (6.25 mg total) by mouth 2 (two) times daily with a meal. 60 tablet 3   famotidine (PEPCID) 20 MG tablet Take 20 mg by mouth daily as needed for heartburn or indigestion.     feeding supplement (ENSURE ENLIVE / ENSURE PLUS) LIQD Take 237 mLs by mouth 3 (three) times daily between meals. (Patient taking differently: Take 237 mLs by mouth 3 (three) times daily as needed (nutrition).) 237 mL 12   fluorouracil CALGB 86767 2,400 mg/m2 in sodium chloride 0.9 % 150 mL Inject 2,400 mg/m2 into the vein over 48 hr. Every 14 days     FLUOROURACIL IV Inject into the vein every 14 (fourteen) days.  LEUCOVORIN CALCIUM IV Inject into the vein every 14 (fourteen) days.     loperamide (IMODIUM A-D) 2 MG tablet Take 1 tablet (2 mg total) by mouth 4 (four) times daily as needed for diarrhea or loose stools.  0   nicotine (NICODERM CQ - DOSED IN MG/24 HOURS) 21 mg/24hr patch Place 21 mg onto the skin daily.     OXALIPLATIN IV Inject into the vein every 14 (fourteen) days.     simvastatin (ZOCOR) 20 MG tablet Take 20 mg by mouth daily.     No current facility-administered medications for this visit.   Facility-Administered Medications Ordered in Other Visits  Medication Dose Route Frequency Provider Last Rate Last Admin   fluorouracil (ADRUCIL) 3,750 mg in sodium chloride 0.9 % 75 mL chemo infusion  1,920 mg/m2 (Treatment Plan Recorded) Intravenous 1 day or 1 dose Derek Jack, MD   Infusion Verify at 12/14/21 1433   magnesium sulfate 2 GM/50ML IVPB            palonosetron (ALOXI) 0.25 MG/5ML injection            sodium chloride flush (NS) 0.9 % injection 10 mL  10 mL Intracatheter PRN Derek Jack, MD   10 mL at 11/18/21 1257    ALLERGIES:  Allergies  Allergen Reactions   Lisinopril Anaphylaxis   Naproxen Shortness Of Breath and Palpitations   Nsaids Shortness Of Breath  and Palpitations    PHYSICAL EXAM:  Performance status (ECOG): 1 - Symptomatic but completely ambulatory  There were no vitals filed for this visit.   Wt Readings from Last 3 Encounters:  12/14/21 170 lb (77.1 kg)  11/30/21 168 lb 9.6 oz (76.5 kg)  11/16/21 168 lb 12.8 oz (76.6 kg)   Physical Exam Vitals reviewed.  Constitutional:      Appearance: Normal appearance.  Cardiovascular:     Rate and Rhythm: Normal rate and regular rhythm.     Pulses: Normal pulses.     Heart sounds: Normal heart sounds.  Pulmonary:     Effort: Pulmonary effort is normal.     Breath sounds: Normal breath sounds.  Neurological:     General: No focal deficit present.     Mental Status: He is alert and oriented to person, place, and time.  Psychiatric:        Mood and Affect: Mood normal.        Behavior: Behavior normal.      LABORATORY DATA:  I have reviewed the labs as listed.     Latest Ref Rng & Units 12/14/2021    9:13 AM 11/30/2021    9:23 AM 11/16/2021    8:27 AM  CBC  WBC 4.0 - 10.5 K/uL 5.2  4.9  5.1   Hemoglobin 13.0 - 17.0 g/dL 12.6  11.9  12.1   Hematocrit 39.0 - 52.0 % 37.8  35.7  37.1   Platelets 150 - 400 K/uL 131  135  171       Latest Ref Rng & Units 12/14/2021    9:13 AM 11/30/2021    9:23 AM 11/16/2021    8:27 AM  CMP  Glucose 70 - 99 mg/dL 124  132  89   BUN 8 - 23 mg/dL 17  25  25    Creatinine 0.61 - 1.24 mg/dL 1.55  1.63  1.68   Sodium 135 - 145 mmol/L 138  138  142   Potassium 3.5 - 5.1 mmol/L 4.3  5.2  4.4   Chloride  98 - 111 mmol/L 111  112  113   CO2 22 - 32 mmol/L 19  21  21    Calcium 8.9 - 10.3 mg/dL 9.0  8.9  8.9   Total Protein 6.5 - 8.1 g/dL 7.3  7.2  7.4   Total Bilirubin 0.3 - 1.2 mg/dL 0.7  0.6  0.5   Alkaline Phos 38 - 126 U/L 113  113  114   AST 15 - 41 U/L 25  27  22    ALT 0 - 44 U/L 17  20  19      DIAGNOSTIC IMAGING:  I have independently reviewed the scans and discussed with the patient. No results found.   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 - Colonoscopy on 03/12/2021: Fungating partially obstructing large mass found at 40 cm proximal to the anus, circumferential with no bleeding present. - CEA on 03/12/2021: 8.3 - CT angio abdomen and pelvis on 04/08/2021: No evidence of metastatic lymphadenopathy or liver mets.  Stable 7 mm subpleural pulmonary nodule in the periphery of the right lower lobe. - CT angio CAP on 02/04/2021: No evidence of lung metastasis, abdominal metastasis. - Resection of splenic flexure mass on 04/23/2021 by Dr. Marcello Moores. - Pathology: Moderately differentiated adenocarcinoma, invades into subserosa, margins negative, 0/38 lymph nodes.  No LVI or perineural invasion.  PT3 pN0.  MSI-stable. - Guardant reveal (05/21/2021): CT DNA was detected. - PET scan on 06/10/2021 shows 2 cm hypermetabolic soft tissue density in the transverse colon.  No evidence of local or distant metastatic disease.    Social/family history: - He is a retired [employer]. - Current active smoker, half pack per day for 50 years. - Mother had colon cancer at age 3.  Maternal grandmother had colon cancer.  3.  Stage III (G8TL5) right colon adenocarcinoma: - Right colectomy on 08/10/2021 by Dr. Marcello Moores. - Pathology with grade 2 moderately differentiated colonic adenocarcinoma, tumor site ascending colon, margins negative, 1/22 lymph nodes involved, [id number].  MMR preserved.        -Cycle 1 of FOLFOX started on 10/05/2021   PLAN:  Stage III (T4AN1) right colon adenocarcinoma, MMR preserved: - Cycle 4 of FOLFOX on 11/30/2021. - Cold sensitivity lasted for 2 days.  Tingling in the hands less than 1 minute last week. - Reviewed labs today which showed LFTs were normal.  Creatinine is 1.55 and at baseline.  CBC was grossly normal with mild thrombocytopenia. - Proceed with cycle 5 today with 20% dose reduction. - We will closely monitor for neuropathy symptoms.  RTC 2  weeks for follow-up.  2.  Normocytic anemia: - Combination anemia from CKD/relative iron deficiency and myelosuppression.  Hemoglobin today is 12.6.  3.  Chemotherapy-induced diarrhea: - Continue Imodium as needed.   Orders placed this encounter:  No orders of the defined types were placed in this encounter.    Derek Jack, MD [place] [phone]

## 2021-12-14 NOTE — Patient Instructions (Signed)
[facility]  Discharge Instructions: Thank you for choosing [facility] to provide your oncology and hematology care.  If you have a lab appointment with the Cancer Center, please come in thru the Main Entrance and check in at the main information desk.  Wear comfortable clothing and clothing appropriate for easy access to any Portacath or PICC line.   We strive to give you quality time with your provider. You may need to reschedule your appointment if you arrive late (15 or more minutes).  Arriving late affects you and other patients whose appointments are after yours.  Also, if you miss three or more appointments without notifying the office, you may be dismissed from the clinic at the provider's discretion.      For prescription refill requests, have your pharmacy contact our office and allow 72 hours for refills to be completed.    Today you received the following chemotherapy and/or immunotherapy agents Folfox   To help prevent nausea and vomiting after your treatment, we encourage you to take your nausea medication as directed.   Oxaliplatin Injection What is this medication? OXALIPLATIN (ox AL i PLA tin) treats some types of cancer. It works by slowing down the growth of cancer cells. This medicine may be used for other purposes; ask your health care provider or pharmacist if you have questions. COMMON BRAND NAME(S): Eloxatin What should I tell my care team before I take this medication? They need to know if you have any of these conditions: Heart disease History of irregular heartbeat or rhythm Liver disease Low blood cell levels (white cells, red cells, and platelets) Lung or breathing disease, such as asthma Take medications that treat or prevent blood clots Tingling of the fingers, toes, or other nerve disorder An unusual or allergic reaction to oxaliplatin, other medications, foods, dyes, or preservatives If you or your partner are  pregnant or trying to get pregnant Breast-feeding How should I use this medication? This medication is injected into a vein. It is given by your care team in a hospital or clinic setting. Talk to your care team about the use of this medication in children. Special care may be needed. Overdosage: If you think you have taken too much of this medicine contact a poison control center or emergency room at once. NOTE: This medicine is only for you. Do not share this medicine with others. What if I miss a dose? Keep appointments for follow-up doses. It is important not to miss a dose. Call your care team if you are unable to keep an appointment. What may interact with this medication? Do not take this medication with any of the following: Cisapride Dronedarone Pimozide Thioridazine This medication may also interact with the following: Aspirin and aspirin-like medications Certain medications that treat or prevent blood clots, such as warfarin, apixaban, dabigatran, and rivaroxaban Cisplatin Cyclosporine Diuretics Medications for infection, such as acyclovir, adefovir, amphotericin B, bacitracin, cidofovir, foscarnet, ganciclovir, gentamicin, pentamidine, vancomycin NSAIDs, medications for pain and inflammation, such as ibuprofen or naproxen Other medications that cause heart rhythm changes Pamidronate Zoledronic acid This list may not describe all possible interactions. Give your health care provider a list of all the medicines, herbs, non-prescription drugs, or dietary supplements you use. Also tell them if you smoke, drink alcohol, or use illegal drugs. Some items may interact with your medicine. What should I watch for while using this medication? Your condition will be monitored carefully while you are receiving this medication. You may   need blood work while taking this medication. This medication may make you feel generally unwell. This is not uncommon as chemotherapy can affect healthy  cells as well as cancer cells. Report any side effects. Continue your course of treatment even though you feel ill unless your care team tells you to stop. This medication may increase your risk of getting an infection. Call your care team for advice if you get a fever, chills, sore throat, or other symptoms of a cold or flu. Do not treat yourself. Try to avoid being around people who are sick. Avoid taking medications that contain aspirin, acetaminophen, ibuprofen, naproxen, or ketoprofen unless instructed by your care team. These medications may hide a fever. Be careful brushing or flossing your teeth or using a toothpick because you may get an infection or bleed more easily. If you have any dental work done, tell your dentist you are receiving this medication. This medication can make you more sensitive to cold. Do not drink cold drinks or use ice. Cover exposed skin before coming in contact with cold temperatures or cold objects. When out in cold weather wear warm clothing and cover your mouth and nose to warm the air that goes into your lungs. Tell your care team if you get sensitive to the cold. Talk to your care team if you or your partner are pregnant or think either of you might be pregnant. This medication can cause serious birth defects if taken during pregnancy and for 9 months after the last dose. A negative pregnancy test is required before starting this medication. A reliable form of contraception is recommended while taking this medication and for 9 months after the last dose. Talk to your care team about effective forms of contraception. Do not father a child while taking this medication and for 6 months after the last dose. Use a condom while having sex during this time period. Do not breastfeed while taking this medication and for 3 months after the last dose. This medication may cause infertility. Talk to your care team if you are concerned about your fertility. What side effects may I  notice from receiving this medication? Side effects that you should report to your care team as soon as possible: Allergic reactions--skin rash, itching, hives, swelling of the face, lips, tongue, or throat Bleeding--bloody or black, tar-like stools, vomiting blood or brown material that looks like coffee grounds, red or dark brown urine, small red or purple spots on skin, unusual bruising or bleeding Dry cough, shortness of breath or trouble breathing Heart rhythm changes--fast or irregular heartbeat, dizziness, feeling faint or lightheaded, chest pain, trouble breathing Infection--fever, chills, cough, sore throat, wounds that don't heal, pain or trouble when passing urine, general feeling of discomfort or being unwell Liver injury--right upper belly pain, loss of appetite, nausea, light-colored stool, dark yellow or brown urine, yellowing skin or eyes, unusual weakness or fatigue Low red blood cell level--unusual weakness or fatigue, dizziness, headache, trouble breathing Muscle injury--unusual weakness or fatigue, muscle pain, dark yellow or brown urine, decrease in amount of urine Pain, tingling, or numbness in the hands or feet Sudden and severe headache, confusion, change in vision, seizures, which may be signs of posterior reversible encephalopathy syndrome (PRES) Unusual bruising or bleeding Side effects that usually do not require medical attention (report to your care team if they continue or are bothersome): Diarrhea Nausea Pain, redness, or swelling with sores inside the mouth or throat Unusual weakness or fatigue Vomiting This list may not describe   possible side effects. Call your doctor for medical advice about side effects. You may report side effects to FDA at 1-800-FDA-1088. Where should I keep my medication? This medication is given in a hospital or clinic. It will not be stored at home. NOTE: This sheet is a summary. It may not cover all possible information. If you have questions  about this medicine, talk to your doctor, pharmacist, or health care provider.  2023 Elsevier/Gold Standard (2021-06-04 00:00:00)  Leucovorin Injection What is this medication? LEUCOVORIN (loo koe VOR in) prevents side effects from certain medications, such as methotrexate. It works by increasing folate levels. This helps protect healthy cells in your body. It may also be used to treat anemia caused by low levels of folate. It can also be used with fluorouracil, a type of chemotherapy, to treat colorectal cancer. It works by increasing the effects of fluorouracil in the body. This medicine may be used for other purposes; ask your health care provider or pharmacist if you have questions. What should I tell my care team before I take this medication? They need to know if you have any of these conditions: Anemia from low levels of vitamin B12 in the blood An unusual or allergic reaction to leucovorin, folic acid, other medications, foods, dyes, or preservatives Pregnant or trying to get pregnant Breastfeeding How should I use this medication? This medication is injected into a vein or a muscle. It is given by your care team in a hospital or clinic setting. Talk to your care team about the use of this medication in children. Special care may be needed. Overdosage: If you think you have taken too much of this medicine contact a poison control center or emergency room at once. NOTE: This medicine is only for you. Do not share this medicine with others. What if I miss a dose? Keep appointments for follow-up doses. It is important not to miss your dose. Call your care team if you are unable to keep an appointment. What may interact with this medication? Capecitabine Fluorouracil Phenobarbital Phenytoin Primidone Trimethoprim;sulfamethoxazole This list may not describe all possible interactions. Give your health care provider a list of all the medicines, herbs, non-prescription drugs, or dietary  supplements you use. Also tell them if you smoke, drink alcohol, or use illegal drugs. Some items may interact with your medicine. What should I watch for while using this medication? Your condition will be monitored carefully while you are receiving this medication. This medication may increase the side effects of 5-fluorouracil. Tell your care team if you have diarrhea or mouth sores that do not get better or that get worse. What side effects may I notice from receiving this medication? Side effects that you should report to your care team as soon as possible: Allergic reactions--skin rash, itching, hives, swelling of the face, lips, tongue, or throat This list may not describe all possible side effects. Call your doctor for medical advice about side effects. You may report side effects to FDA at 1-800-FDA-1088. Where should I keep my medication? This medication is given in a hospital or clinic. It will not be stored at home. NOTE: This sheet is a summary. It may not cover all possible information. If you have questions about this medicine, talk to your doctor, pharmacist, or health care provider.  2023 Elsevier/Gold Standard (2021-06-18 00:00:00)  Fluorouracil Injection What is this medication? FLUOROURACIL (flure oh YOOR a sil) treats some types of cancer. It works by slowing down the growth of cancer cells.  cells. This medicine may be used for other purposes; ask your health care provider or pharmacist if you have questions. COMMON BRAND NAME(S): Adrucil What should I tell my care team before I take this medication? They need to know if you have any of these conditions: Blood disorders Dihydropyrimidine dehydrogenase (DPD) deficiency Infection, such as chickenpox, cold sores, herpes Kidney disease Liver disease Poor nutrition Recent or ongoing radiation therapy An unusual or allergic reaction to fluorouracil, other medications, foods, dyes, or preservatives If you or your partner  are pregnant or trying to get pregnant Breast-feeding How should I use this medication? This medication is injected into a vein. It is administered by your care team in a hospital or clinic setting. Talk to your care team about the use of this medication in children. Special care may be needed. Overdosage: If you think you have taken too much of this medicine contact a poison control center or emergency room at once. NOTE: This medicine is only for you. Do not share this medicine with others. What if I miss a dose? Keep appointments for follow-up doses. It is important not to miss your dose. Call your care team if you are unable to keep an appointment. What may interact with this medication? Do not take this medication with any of the following: Live virus vaccines This medication may also interact with the following: Medications that treat or prevent blood clots, such as warfarin, enoxaparin, dalteparin This list may not describe all possible interactions. Give your health care provider a list of all the medicines, herbs, non-prescription drugs, or dietary supplements you use. Also tell them if you smoke, drink alcohol, or use illegal drugs. Some items may interact with your medicine. What should I watch for while using this medication? Your condition will be monitored carefully while you are receiving this medication. This medication may make you feel generally unwell. This is not uncommon as chemotherapy can affect healthy cells as well as cancer cells. Report any side effects. Continue your course of treatment even though you feel ill unless your care team tells you to stop. In some cases, you may be given additional medications to help with side effects. Follow all directions for their use. This medication may increase your risk of getting an infection. Call your care team for advice if you get a fever, chills, sore throat, or other symptoms of a cold or flu. Do not treat yourself. Try to  avoid being around people who are sick. This medication may increase your risk to bruise or bleed. Call your care team if you notice any unusual bleeding. Be careful brushing or flossing your teeth or using a toothpick because you may get an infection or bleed more easily. If you have any dental work done, tell your dentist you are receiving this medication. Avoid taking medications that contain aspirin, acetaminophen, ibuprofen, naproxen, or ketoprofen unless instructed by your care team. These medications may hide a fever. Do not treat diarrhea with over the counter products. Contact your care team if you have diarrhea that lasts more than 2 days or if it is severe and watery. This medication can make you more sensitive to the sun. Keep out of the sun. If you cannot avoid being in the sun, wear protective clothing and sunscreen. Do not use sun lamps, tanning beds, or tanning booths. Talk to your care team if you or your partner wish to become pregnant or think you might be pregnant. This medication can cause serious birth defects   taken during pregnancy and for 3 months after the last dose. A reliable form of contraception is recommended while taking this medication and for 3 months after the last dose. Talk to your care team about effective forms of contraception. Do not father a child while taking this medication and for 3 months after the last dose. Use a condom while having sex during this time period. Do not breastfeed while taking this medication. This medication may cause infertility. Talk to your care team if you are concerned about your fertility. What side effects may I notice from receiving this medication? Side effects that you should report to your care team as soon as possible: Allergic reactions--skin rash, itching, hives, swelling of the face, lips, tongue, or throat Heart attack--pain or tightness in the chest, shoulders, arms, or jaw, nausea, shortness of breath, cold or clammy skin,  feeling faint or lightheaded Heart failure--shortness of breath, swelling of the ankles, feet, or hands, sudden weight gain, unusual weakness or fatigue Heart rhythm changes--fast or irregular heartbeat, dizziness, feeling faint or lightheaded, chest pain, trouble breathing High ammonia level--unusual weakness or fatigue, confusion, loss of appetite, nausea, vomiting, seizures Infection--fever, chills, cough, sore throat, wounds that don't heal, pain or trouble when passing urine, general feeling of discomfort or being unwell Low red blood cell level--unusual weakness or fatigue, dizziness, headache, trouble breathing Pain, tingling, or numbness in the hands or feet, muscle weakness, change in vision, confusion or trouble speaking, loss of balance or coordination, trouble walking, seizures Redness, swelling, and blistering of the skin over hands and feet Severe or prolonged diarrhea Unusual bruising or bleeding Side effects that usually do not require medical attention (report to your care team if they continue or are bothersome): Dry skin Headache Increased tears Nausea Pain, redness, or swelling with sores inside the mouth or throat Sensitivity to light Vomiting This list may not describe all possible side effects. Call your doctor for medical advice about side effects. You may report side effects to FDA at 1-800-FDA-1088. Where should I keep my medication? This medication is given in a hospital or clinic. It will not be stored at home. NOTE: This sheet is a summary. It may not cover all possible information. If you have questions about this medicine, talk to your doctor, pharmacist, or health care provider.  2023 Elsevier/Gold Standard (2021-06-15 00:00:00)   BELOW ARE SYMPTOMS THAT SHOULD BE REPORTED IMMEDIATELY: *FEVER GREATER THAN 100.4 F (38 C) OR HIGHER *CHILLS OR SWEATING *NAUSEA AND VOMITING THAT IS NOT CONTROLLED WITH YOUR NAUSEA MEDICATION *UNUSUAL SHORTNESS OF  BREATH *UNUSUAL BRUISING OR BLEEDING *URINARY PROBLEMS (pain or burning when urinating, or frequent urination) *BOWEL PROBLEMS (unusual diarrhea, constipation, pain near the anus) TENDERNESS IN MOUTH AND THROAT WITH OR WITHOUT PRESENCE OF ULCERS (sore throat, sores in mouth, or a toothache) UNUSUAL RASH, SWELLING OR PAIN  UNUSUAL VAGINAL DISCHARGE OR ITCHING   Items with * indicate a potential emergency and should be followed up as soon as possible or go to the Emergency Department if any problems should occur.  Please show the CHEMOTHERAPY ALERT CARD or IMMUNOTHERAPY ALERT CARD at check-in to the Emergency Department and triage nurse.  Should you have questions after your visit or need to cancel or reschedule your appointment, please contact [facility] [phone]  and follow the prompts.  Office hours are 8:00 a.m. to 4:30 p.m. Monday - Friday. Please note that voicemails left after 4:00 p.m. may not be returned until the following business day.  We are closed weekends and major holidays. You have access to a nurse at all times for urgent questions. Please call the main number to the clinic [phone] and follow the prompts.  For any non-urgent questions, you may also contact your provider using MyChart. We now offer e-Visits for anyone 21 and older to request care online for non-urgent symptoms. For details visit [email or web address].   Also download the MyChart app! Go to the app store, search "MyChart", open the app, select [facility], and log in with your MyChart username and password.  Masks are optional in the cancer centers. If you would like for your care team to wear a mask while they are taking care of you, please let them know. You may have one support person who is at least 71 years old accompany you for your appointments.

## 2021-12-14 NOTE — Progress Notes (Signed)
Patient has been assessed, vital signs and labs have been reviewed by Dr. Katragadda. ANC, Creatinine, LFTs, and Platelets are within treatment parameters per Dr. Katragadda. The patient is good to proceed with treatment at this time. Primary RN and pharmacy aware.  

## 2021-12-16 ENCOUNTER — Inpatient Hospital Stay: Payer: Medicare Other

## 2021-12-16 VITALS — BP 123/77 | HR 78 | Temp 98.1°F | Resp 16

## 2021-12-16 DIAGNOSIS — R911 Solitary pulmonary nodule: Secondary | ICD-10-CM | POA: Diagnosis not present

## 2021-12-16 DIAGNOSIS — N189 Chronic kidney disease, unspecified: Secondary | ICD-10-CM | POA: Diagnosis not present

## 2021-12-16 DIAGNOSIS — Z452 Encounter for adjustment and management of vascular access device: Secondary | ICD-10-CM | POA: Diagnosis not present

## 2021-12-16 DIAGNOSIS — E785 Hyperlipidemia, unspecified: Secondary | ICD-10-CM | POA: Diagnosis not present

## 2021-12-16 DIAGNOSIS — C183 Malignant neoplasm of hepatic flexure: Secondary | ICD-10-CM | POA: Diagnosis not present

## 2021-12-16 DIAGNOSIS — I10 Essential (primary) hypertension: Secondary | ICD-10-CM | POA: Diagnosis not present

## 2021-12-16 DIAGNOSIS — Z95828 Presence of other vascular implants and grafts: Secondary | ICD-10-CM

## 2021-12-16 DIAGNOSIS — Z5111 Encounter for antineoplastic chemotherapy: Secondary | ICD-10-CM | POA: Diagnosis not present

## 2021-12-16 DIAGNOSIS — R197 Diarrhea, unspecified: Secondary | ICD-10-CM | POA: Diagnosis not present

## 2021-12-16 DIAGNOSIS — D631 Anemia in chronic kidney disease: Secondary | ICD-10-CM | POA: Diagnosis not present

## 2021-12-16 MED ORDER — SODIUM CHLORIDE 0.9% FLUSH
10.0000 mL | INTRAVENOUS | Status: DC | PRN
  Administered 2021-12-16: 10 mL

## 2021-12-16 MED ORDER — HEPARIN SOD (PORK) LOCK FLUSH 100 UNIT/ML IV SOLN
500.0000 [IU] | Freq: Once | INTRAVENOUS | Status: AC | PRN
  Administered 2021-12-16: 500 [IU]

## 2021-12-16 NOTE — Progress Notes (Signed)
Patient presents today for pump d/c. Vital signs are stable. Port a cath site clean, dry, and intact. Port flushed with 10 mls of Normal Saline and 500 Units of Heparin. Needle removed intact. Band aid applied. Patient has no complaints at this time. Discharged from clinic ambulatory and in stable condition. Patient alert and oriented.  

## 2021-12-16 NOTE — Patient Instructions (Signed)
[facility]  Discharge Instructions: Thank you for choosing [facility] to provide your oncology and hematology care.  If you have a lab appointment with the [facility], please come in thru the Main Entrance and check in at the main information desk.  Wear comfortable clothing and clothing appropriate for easy access to any Portacath or PICC line.   We strive to give you quality time with your provider. You may need to reschedule your appointment if you arrive late (15 or more minutes).  Arriving late affects you and other patients whose appointments are after yours.  Also, if you miss three or more appointments without notifying the office, you may be dismissed from the clinic at the provider's discretion.      For prescription refill requests, have your pharmacy contact our office and allow 72 hours for refills to be completed.    Today you received the following chemotherapy and/or immunotherapy agents 5FU pump d/c.       To help prevent nausea and vomiting after your treatment, we encourage you to take your nausea medication as directed.  BELOW ARE SYMPTOMS THAT SHOULD BE REPORTED IMMEDIATELY: *FEVER GREATER THAN 100.4 F (38 C) OR HIGHER *CHILLS OR SWEATING *NAUSEA AND VOMITING THAT IS NOT CONTROLLED WITH YOUR NAUSEA MEDICATION *UNUSUAL SHORTNESS OF BREATH *UNUSUAL BRUISING OR BLEEDING *URINARY PROBLEMS (pain or burning when urinating, or frequent urination) *BOWEL PROBLEMS (unusual diarrhea, constipation, pain near the anus) TENDERNESS IN MOUTH AND THROAT WITH OR WITHOUT PRESENCE OF ULCERS (sore throat, sores in mouth, or a toothache) UNUSUAL RASH, SWELLING OR PAIN  UNUSUAL VAGINAL DISCHARGE OR ITCHING   Items with * indicate a potential emergency and should be followed up as soon as possible or go to the Emergency Department if any problems should occur.  Please show the CHEMOTHERAPY ALERT CARD or IMMUNOTHERAPY ALERT CARD at check-in to the  Emergency Department and triage nurse.  Should you have questions after your visit or need to cancel or reschedule your appointment, please contact [facility] [phone]  and follow the prompts.  Office hours are 8:00 a.m. to 4:30 p.m. Monday - Friday. Please note that voicemails left after 4:00 p.m. may not be returned until the following business day.  We are closed weekends and major holidays. You have access to a nurse at all times for urgent questions. Please call the main number to the clinic [phone] and follow the prompts.  For any non-urgent questions, you may also contact your provider using MyChart. We now offer e-Visits for anyone 48 and older to request care online for non-urgent symptoms. For details visit [email or web address].   Also download the MyChart app! Go to the app store, search "MyChart", open the app, select [facility], and log in with your MyChart username and password.  Masks are optional in the cancer centers. If you would like for your care team to wear a mask while they are taking care of you, please let them know. You may have one support person who is at least 71 years old accompany you for your appointments.

## 2021-12-22 DIAGNOSIS — I10 Essential (primary) hypertension: Secondary | ICD-10-CM | POA: Diagnosis not present

## 2021-12-22 DIAGNOSIS — F17200 Nicotine dependence, unspecified, uncomplicated: Secondary | ICD-10-CM | POA: Diagnosis not present

## 2021-12-22 DIAGNOSIS — E781 Pure hyperglyceridemia: Secondary | ICD-10-CM | POA: Diagnosis not present

## 2021-12-22 DIAGNOSIS — C185 Malignant neoplasm of splenic flexure: Secondary | ICD-10-CM | POA: Diagnosis not present

## 2021-12-22 DIAGNOSIS — N1831 Chronic kidney disease, stage 3a: Secondary | ICD-10-CM | POA: Diagnosis not present

## 2021-12-22 DIAGNOSIS — E785 Hyperlipidemia, unspecified: Secondary | ICD-10-CM | POA: Diagnosis not present

## 2021-12-22 DIAGNOSIS — R197 Diarrhea, unspecified: Secondary | ICD-10-CM | POA: Diagnosis not present

## 2021-12-22 DIAGNOSIS — Z950 Presence of cardiac pacemaker: Secondary | ICD-10-CM | POA: Diagnosis not present

## 2021-12-22 DIAGNOSIS — I714 Abdominal aortic aneurysm, without rupture, unspecified: Secondary | ICD-10-CM | POA: Diagnosis not present

## 2021-12-22 DIAGNOSIS — R748 Abnormal levels of other serum enzymes: Secondary | ICD-10-CM | POA: Diagnosis not present

## 2021-12-22 DIAGNOSIS — R809 Proteinuria, unspecified: Secondary | ICD-10-CM | POA: Diagnosis not present

## 2021-12-28 ENCOUNTER — Inpatient Hospital Stay: Payer: Medicare Other | Attending: Hematology

## 2021-12-28 ENCOUNTER — Inpatient Hospital Stay: Payer: Medicare Other

## 2021-12-28 ENCOUNTER — Inpatient Hospital Stay (HOSPITAL_BASED_OUTPATIENT_CLINIC_OR_DEPARTMENT_OTHER): Payer: Medicare Other | Admitting: Hematology

## 2021-12-28 VITALS — BP 154/83 | HR 73 | Temp 97.2°F | Resp 18

## 2021-12-28 DIAGNOSIS — Z95828 Presence of other vascular implants and grafts: Secondary | ICD-10-CM

## 2021-12-28 DIAGNOSIS — R197 Diarrhea, unspecified: Secondary | ICD-10-CM | POA: Diagnosis not present

## 2021-12-28 DIAGNOSIS — C183 Malignant neoplasm of hepatic flexure: Secondary | ICD-10-CM | POA: Insufficient documentation

## 2021-12-28 DIAGNOSIS — Z5111 Encounter for antineoplastic chemotherapy: Secondary | ICD-10-CM | POA: Insufficient documentation

## 2021-12-28 DIAGNOSIS — R911 Solitary pulmonary nodule: Secondary | ICD-10-CM | POA: Insufficient documentation

## 2021-12-28 DIAGNOSIS — D631 Anemia in chronic kidney disease: Secondary | ICD-10-CM | POA: Insufficient documentation

## 2021-12-28 DIAGNOSIS — Z452 Encounter for adjustment and management of vascular access device: Secondary | ICD-10-CM | POA: Diagnosis not present

## 2021-12-28 DIAGNOSIS — N189 Chronic kidney disease, unspecified: Secondary | ICD-10-CM | POA: Diagnosis not present

## 2021-12-28 LAB — COMPREHENSIVE METABOLIC PANEL
ALT: 16 U/L (ref 0–44)
AST: 23 U/L (ref 15–41)
Albumin: 3.7 g/dL (ref 3.5–5.0)
Alkaline Phosphatase: 121 U/L (ref 38–126)
Anion gap: 10 (ref 5–15)
BUN: 21 mg/dL (ref 8–23)
CO2: 20 mmol/L — ABNORMAL LOW (ref 22–32)
Calcium: 9.1 mg/dL (ref 8.9–10.3)
Chloride: 110 mmol/L (ref 98–111)
Creatinine, Ser: 1.51 mg/dL — ABNORMAL HIGH (ref 0.61–1.24)
GFR, Estimated: 49 mL/min — ABNORMAL LOW (ref >60)
Glucose, Bld: 102 mg/dL — ABNORMAL HIGH (ref 70–99)
Potassium: 4.3 mmol/L (ref 3.5–5.1)
Sodium: 140 mmol/L (ref 135–145)
Total Bilirubin: 0.7 mg/dL (ref 0.3–1.2)
Total Protein: 7.5 g/dL (ref 6.5–8.1)

## 2021-12-28 LAB — CBC WITH DIFFERENTIAL/PLATELET
Abs Immature Granulocytes: 0.01 10*3/uL (ref 0.00–0.07)
Basophils Absolute: 0.1 10*3/uL (ref 0.0–0.1)
Basophils Relative: 1 %
Eosinophils Absolute: 0.2 10*3/uL (ref 0.0–0.5)
Eosinophils Relative: 4 %
HCT: 37.8 % — ABNORMAL LOW (ref 39.0–52.0)
Hemoglobin: 12.7 g/dL — ABNORMAL LOW (ref 13.0–17.0)
Immature Granulocytes: 0 %
Lymphocytes Relative: 42 %
Lymphs Abs: 2.1 10*3/uL (ref 0.7–4.0)
MCH: 32.7 pg (ref 26.0–34.0)
MCHC: 33.6 g/dL (ref 30.0–36.0)
MCV: 97.4 fL (ref 80.0–100.0)
Monocytes Absolute: 0.7 10*3/uL (ref 0.1–1.0)
Monocytes Relative: 13 %
Neutro Abs: 2 10*3/uL (ref 1.7–7.7)
Neutrophils Relative %: 40 %
Platelets: 149 10*3/uL — ABNORMAL LOW (ref 150–400)
RBC: 3.88 MIL/uL — ABNORMAL LOW (ref 4.22–5.81)
RDW: 16 % — ABNORMAL HIGH (ref 11.5–15.5)
WBC: 5.1 10*3/uL (ref 4.0–10.5)
nRBC: 0 % (ref 0.0–0.2)

## 2021-12-28 MED ORDER — FLUOROURACIL CHEMO INJECTION 2.5 GM/50ML
320.0000 mg/m2 | Freq: Once | INTRAVENOUS | Status: AC
  Administered 2021-12-28: 650 mg via INTRAVENOUS
  Filled 2021-12-28: qty 13

## 2021-12-28 MED ORDER — SODIUM CHLORIDE 0.9 % IV SOLN
10.0000 mg | Freq: Once | INTRAVENOUS | Status: AC
  Administered 2021-12-28: 10 mg via INTRAVENOUS
  Filled 2021-12-28: qty 10

## 2021-12-28 MED ORDER — PALONOSETRON HCL INJECTION 0.25 MG/5ML
0.2500 mg | Freq: Once | INTRAVENOUS | Status: AC
  Administered 2021-12-28: 0.25 mg via INTRAVENOUS
  Filled 2021-12-28: qty 5

## 2021-12-28 MED ORDER — LEUCOVORIN CALCIUM INJECTION 350 MG
320.0000 mg/m2 | Freq: Once | INTRAVENOUS | Status: AC
  Administered 2021-12-28: 628 mg via INTRAVENOUS
  Filled 2021-12-28: qty 31.4

## 2021-12-28 MED ORDER — SODIUM CHLORIDE 0.9% FLUSH
10.0000 mL | Freq: Once | INTRAVENOUS | Status: AC
  Administered 2021-12-28: 10 mL via INTRAVENOUS

## 2021-12-28 MED ORDER — HEPARIN SOD (PORK) LOCK FLUSH 100 UNIT/ML IV SOLN: 500.0000 [IU] | Freq: Once | INTRAVENOUS | Status: DC | PRN

## 2021-12-28 MED ORDER — SODIUM CHLORIDE 0.9 % IV SOLN
1920.0000 mg/m2 | INTRAVENOUS | Status: DC
  Administered 2021-12-28: 3750 mg via INTRAVENOUS
  Filled 2021-12-28: qty 75

## 2021-12-28 MED ORDER — SODIUM CHLORIDE 0.9% FLUSH
10.0000 mL | INTRAVENOUS | Status: DC | PRN
  Administered 2021-12-28: 10 mL

## 2021-12-28 MED ORDER — OXALIPLATIN CHEMO INJECTION 100 MG/20ML
68.0000 mg/m2 | Freq: Once | INTRAVENOUS | Status: AC
  Administered 2021-12-28: 135 mg via INTRAVENOUS
  Filled 2021-12-28: qty 20

## 2021-12-28 MED ORDER — DEXTROSE 5 % IV SOLN: Freq: Once | INTRAVENOUS | Status: AC

## 2021-12-28 NOTE — Progress Notes (Signed)
[address] [place], [place] [address]   CLINIC:  Medical Oncology/Hematology  PCP:  [name], FNP [address] [name] [place] [address] [phone]   REASON FOR VISIT:  Follow-up for stage 3B right colon cancer  PRIOR THERAPY: Resection of splenic flexure mass on 04/23/2021 by Dr. Marcello Moores, right colectomy on 08/18/2021  NGS Results:  MSI-stable  CURRENT THERAPY: Surveillance   CANCER STAGING:  Cancer Staging  Cancer of left colon [facility]) Staging form: Colon and Rectum, AJCC 8th Edition - Clinical stage from 05/11/2021: Stage IIA (cT3, cN0, cM0) - Unsigned  Primary cancer of hepatic flexure s/p robotic right colectomy 08/18/2021 Staging form: Colon and Rectum, AJCC 8th Edition - Clinical stage from 09/23/2021: Stage IIIB (cT4a, cN1a, cM0) - Unsigned   INTERVAL HISTORY:  Mr. RALF KONOPKA, a 71 y.o. male, seen for follow-up of colon cancer and toxicity assessment prior to cycle 6 of chemotherapy.  He reported hand and feet feeling cold for couple of days after last treatment.  Imodium helps with the diarrhea during first week.  Denies any fevers or infections.  REVIEW OF SYSTEMS:  Review of Systems  Respiratory:  Positive for shortness of breath (exertion).   Gastrointestinal:  Positive for diarrhea.  All other systems reviewed and are negative.   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 - [employer] Jude/[employer] MRI 2272 dual chamber PPM   Tuberculosis    as a child, was treated   Past Surgical History:  Procedure Laterality Date   ABDOMINAL AORTIC ENDOVASCULAR STENT GRAFT Bilateral 02/26/2021   Procedure:  ABDOMINAL AORTIC ENDOVASCULAR STENT GRAFT REPAIR;  Surgeon: Marty Heck, MD;  Location: [facility];  Service: Vascular;  Laterality: Bilateral;   BIOPSY  03/12/2021   Procedure: BIOPSY;  Surgeon: Harvel Quale, MD;  [facility];  Service: Gastroenterology;;   BIOPSY  06/25/2021   Procedure: BIOPSY;  Surgeon: Harvel Quale, MD;  Location[facility];  Service: Gastroenterology;;  mass    COLONOSCOPY WITH PROPOFOL N/A 03/12/2021   Procedure: COLONOSCOPY WITH PROPOFOL;  Surgeon: Harvel Quale, MD;  [facility];  Service: Gastroenterology;  Laterality: N/A;  10:55   COLONOSCOPY WITH PROPOFOL N/A 06/25/2021   Procedure: COLONOSCOPY WITH PROPOFOL;  Surgeon: Harvel Quale, MD;  Location[facility];  Service: Gastroenterology;  Laterality: N/A;  235 ASA 2   HEMOSTASIS CLIP PLACEMENT  03/12/2021   Procedure: HEMOSTASIS CLIP PLACEMENT;  Surgeon: Harvel Quale, MD;  [facility];  Service: Gastroenterology;;   HEMOSTASIS CLIP PLACEMENT  06/25/2021   Procedure: HEMOSTASIS CLIP PLACEMENT;  Surgeon: Harvel Quale, MD;  [facility];  Service: Gastroenterology;;   HERNIA REPAIR Left 10/25/2006   IR IMAGING GUIDED PORT INSERTION  09/29/2021   PACEMAKER IMPLANT N/A 02/08/2021   Procedure: PACEMAKER IMPLANT;  Surgeon: Deboraha Sprang, MD;  Location: [facility];  Service: Cardiovascular;  Laterality: N/A;   POLYPECTOMY  03/12/2021   Procedure: POLYPECTOMY;  Surgeon: Harvel Quale, MD;  [facility];  Service: Gastroenterology;;   POLYPECTOMY  06/25/2021   Procedure: POLYPECTOMY;  Surgeon: [name], [name], MD;  [facility];  Service: Gastroenterology;;   [name]  03/12/2021   Procedure: [name];  Surgeon: [name], [name], MD;  [facility];  Service: Gastroenterology;;    SOCIAL HISTORY:  Social History    Socioeconomic History   Marital status: Single    Spouse name: Not on file   Number of children: Not on file   Years of education: Not on file   Highest education level: Not on file  Occupational History   Not on file  Tobacco Use   Smoking status: Every Day    Packs/day: 0.75    Years: 50.00    Total pack years: 37.50    Types: Cigarettes    Passive exposure: Current   Smokeless tobacco: Never  Vaping Use   Vaping Use: Never used  Substance and Sexual Activity   Alcohol use: Yes    Alcohol/week: 2.0 - 3.0 standard drinks of alcohol    Types: 2 - 3 Shots of liquor per week    Comment: daily liquor moderatly   Drug use: Not on file    Comment: uses every other day   Sexual activity: Not Currently  Other Topics Concern   Not on file  Social History Narrative   Not on file   Social Determinants of Health   Financial Resource Strain: High Risk (10/14/2021)   Overall Financial Resource Strain (CARDIA)    Difficulty of Paying Living Expenses: Hard  Food Insecurity: Not on file  Transportation Needs: Not on file  Physical Activity: Not on file  Stress: Not on file  Social Connections: Not on file  Intimate Partner Violence: Not on file    FAMILY HISTORY:  Family History  Problem Relation Age of Onset   Heart disease Father    Colon polyps Sister        less than 10 lifetime polyps   Colon cancer Maternal Grandmother        dx 90s    CURRENT MEDICATIONS:  Current Outpatient Medications  Medication Sig Dispense Refill   albuterol (VENTOLIN HFA) 108 (90 Base) MCG/ACT inhaler Inhale 2 puffs into the lungs every 6 (six) hours as needed for wheezing or shortness of breath. 8 g 2   amLODipine (NORVASC) 2.5 MG tablet TAKE 1 TABLET(2.5 MG) BY MOUTH DAILY 90 tablet 3   aspirin EC 81 MG EC tablet Take 1 tablet (81 mg total) by mouth daily at 6 (six) AM. Swallow whole. 30 tablet 11   bisacodyl (DULCOLAX) 5 MG EC tablet Take 5 mg by mouth daily as needed for moderate  constipation.     carvedilol (COREG) 6.25 MG tablet Take 1 tablet (6.25 mg total) by mouth 2 (two) times daily with a meal. 60 tablet 3   famotidine (PEPCID) 20 MG tablet Take 20 mg by mouth daily as needed for heartburn or indigestion.     feeding supplement (ENSURE ENLIVE / ENSURE PLUS) LIQD Take 237 mLs by mouth 3 (three) times daily between meals. (Patient taking differently: Take 237 mLs by mouth 3 (three) times daily as needed (nutrition).) 237 mL 12   fluorouracil CALGB 13086 2,400 mg/m2 in sodium chloride 0.9 % 150 mL Inject 2,400 mg/m2 into the vein over 48 hr. Every 14 days     FLUOROURACIL IV Inject into the vein every 14 (fourteen) days.     LEUCOVORIN CALCIUM IV Inject into the vein  every 14 (fourteen) days.     loperamide (IMODIUM A-D) 2 MG tablet Take 1 tablet (2 mg total) by mouth 4 (four) times daily as needed for diarrhea or loose stools.  0   nicotine (NICODERM CQ - DOSED IN MG/24 HOURS) 21 mg/24hr patch Place 21 mg onto the skin daily.     OXALIPLATIN IV Inject into the vein every 14 (fourteen) days.     prochlorperazine (COMPAZINE) 10 MG tablet      simvastatin (ZOCOR) 20 MG tablet Take 20 mg by mouth daily.     No current facility-administered medications for this visit.   Facility-Administered Medications Ordered in Other Visits  Medication Dose Route Frequency Provider Last Rate Last Admin   fluorouracil (ADRUCIL) 3,750 mg in sodium chloride 0.9 % 75 mL chemo infusion  1,920 mg/m2 (Treatment Plan Recorded) Intravenous 1 day or 1 dose Derek Jack, MD   3,750 mg at 12/28/21 1525   heparin lock flush 100 unit/mL  500 Units Intracatheter Once PRN Derek Jack, MD       magnesium sulfate 2 GM/50ML IVPB            palonosetron (ALOXI) 0.25 MG/5ML injection            sodium chloride flush (NS) 0.9 % injection 10 mL  10 mL Intracatheter PRN Derek Jack, MD   10 mL at 11/18/21 1257   sodium chloride flush (NS) 0.9 % injection 10 mL  10 mL Intracatheter  PRN Derek Jack, MD   10 mL at 12/28/21 1515    ALLERGIES:  Allergies  Allergen Reactions   Lisinopril Anaphylaxis   Naproxen Shortness Of Breath and Palpitations   Nsaids Shortness Of Breath and Palpitations    PHYSICAL EXAM:  Performance status (ECOG): 1 - Symptomatic but completely ambulatory  There were no vitals filed for this visit.   Wt Readings from Last 3 Encounters:  12/28/21 170 lb (77.1 kg)  12/14/21 170 lb (77.1 kg)  11/30/21 168 lb 9.6 oz (76.5 kg)   Physical Exam Vitals reviewed.  Constitutional:      Appearance: Normal appearance.  Cardiovascular:     Rate and Rhythm: Normal rate and regular rhythm.     Pulses: Normal pulses.     Heart sounds: Normal heart sounds.  Pulmonary:     Effort: Pulmonary effort is normal.     Breath sounds: Normal breath sounds.  Neurological:     General: No focal deficit present.     Mental Status: He is alert and oriented to person, place, and time.  Psychiatric:        Mood and Affect: Mood normal.        Behavior: Behavior normal.      LABORATORY DATA:  I have reviewed the labs as listed.     Latest Ref Rng & Units 12/28/2021    9:48 AM 12/14/2021    9:13 AM 11/30/2021    9:23 AM  CBC  WBC 4.0 - 10.5 K/uL 5.1  5.2  4.9   Hemoglobin 13.0 - 17.0 g/dL 12.7  12.6  11.9   Hematocrit 39.0 - 52.0 % 37.8  37.8  35.7   Platelets 150 - 400 K/uL 149  131  135       Latest Ref Rng & Units 12/28/2021    9:48 AM 12/14/2021    9:13 AM 11/30/2021    9:23 AM  CMP  Glucose 70 - 99 mg/dL 102  124  132   BUN 8 -  23 mg/dL _0 Creatinine 0.61 - 1.24 mg/dL 1.51  1.55  1.63   Sodium 135 - 145 mmol/L 140  138  138   Potassium 3.5 - 5.1 mmol/L 4.3  4.3  5.2   Chloride 98 - 111 mmol/L 110  111  112   CO2 22 - 32 mmol/L _1 Calcium 8.9 - 10.3 mg/dL 9.1  9.0  8.9   Total Protein 6.5 - 8.1 g/dL 7.5  7.3  7.2   Total Bilirubin 0.3 - 1.2 mg/dL 0.7  0.7  0.6   Alkaline Phos 38 - 126 U/L 121  113  113   AST  15 - 41 U/L _2 ALT 0 - 44 U/L _3 DIAGNOSTIC IMAGING:  I have independently reviewed the scans and discussed with the patient. No results found.   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 - Colonoscopy on 03/12/2021: Fungating partially obstructing large mass found at 40 cm proximal to the anus, circumferential with no bleeding present. - CEA on 03/12/2021: 8.3 - CT angio abdomen and pelvis on 04/08/2021: No evidence of metastatic lymphadenopathy or liver mets.  Stable 7 mm subpleural pulmonary nodule in the periphery of the right lower lobe. - CT angio CAP on 02/04/2021: No evidence of lung metastasis, abdominal metastasis. - Resection of splenic flexure mass on 04/23/2021 by Dr. Marcello Moores. - Pathology: Moderately differentiated adenocarcinoma, invades into subserosa, margins negative, 0/38 lymph nodes.  No LVI or perineural invasion.  PT3 pN0.  MSI-stable. - Guardant reveal (05/21/2021): CT DNA was detected. - PET scan on 06/10/2021 shows 2 cm hypermetabolic soft tissue density in the transverse colon.  No evidence of local or distant metastatic disease.    Social/family history: - He is a retired [employer]. - Current active smoker, half pack per day for 50 years. - Mother had colon cancer at age 63.  Maternal grandmother had colon cancer.  3.  Stage III (J1BZ2) right colon adenocarcinoma: - Right colectomy on 08/10/2021 by Dr. Marcello Moores. - Pathology with grade 2 moderately differentiated colonic adenocarcinoma, tumor site ascending colon, margins negative, 1/22 lymph nodes involved, [id number].  MMR preserved.        -Cycle 1 of FOLFOX started on 10/05/2021   PLAN:  Stage III (T4AN1) right colon adenocarcinoma, MMR preserved: - He tolerated cycle 5 of FOLFOX reasonably well. - He had hands and feet feeling cold for couple of days after last cycle. - Reviewed labs today which showed CKD stable with  creatinine 1.51.  LFTs are normal.  CBC was grossly normal. - Proceed with cycle 6 with a 80% dose and cycle 7 in 2 weeks.  RTC 4 weeks for follow-up with repeat CEA and labs.  2.  Normocytic anemia: - Comment anemia from CKD/relative iron deficiency.  Hemoglobin is 12.7 and stable.  3.  Chemotherapy-induced diarrhea: - Continue Imodium as needed.  Which is helping.   Orders placed this encounter:  No orders of the defined types were placed in this encounter.    Derek Jack, MD [place] [phone]

## 2021-12-28 NOTE — Patient Instructions (Signed)
[facility]  Discharge Instructions: Thank you for choosing [facility] to provide your oncology and hematology care.  If you have a lab appointment with the [facility], please come in thru the Main Entrance and check in at the main information desk.  Wear comfortable clothing and clothing appropriate for easy access to any Portacath or PICC line.   We strive to give you quality time with your provider. You may need to reschedule your appointment if you arrive late (15 or more minutes).  Arriving late affects you and other patients whose appointments are after yours.  Also, if you miss three or more appointments without notifying the office, you may be dismissed from the clinic at the provider's discretion.      For prescription refill requests, have your pharmacy contact our office and allow 72 hours for refills to be completed.    Today you received the following chemotherapy and/or immunotherapy agents FOLFOX, return as scheduled.   To help prevent nausea and vomiting after your treatment, we encourage you to take your nausea medication as directed.  BELOW ARE SYMPTOMS THAT SHOULD BE REPORTED IMMEDIATELY: *FEVER GREATER THAN 100.4 F (38 C) OR HIGHER *CHILLS OR SWEATING *NAUSEA AND VOMITING THAT IS NOT CONTROLLED WITH YOUR NAUSEA MEDICATION *UNUSUAL SHORTNESS OF BREATH *UNUSUAL BRUISING OR BLEEDING *URINARY PROBLEMS (pain or burning when urinating, or frequent urination) *BOWEL PROBLEMS (unusual diarrhea, constipation, pain near the anus) TENDERNESS IN MOUTH AND THROAT WITH OR WITHOUT PRESENCE OF ULCERS (sore throat, sores in mouth, or a toothache) UNUSUAL RASH, SWELLING OR PAIN  UNUSUAL VAGINAL DISCHARGE OR ITCHING   Items with * indicate a potential emergency and should be followed up as soon as possible or go to the Emergency Department if any problems should occur.  Please show the CHEMOTHERAPY ALERT CARD or IMMUNOTHERAPY ALERT CARD at  check-in to the Emergency Department and triage nurse.  Should you have questions after your visit or need to cancel or reschedule your appointment, please contact [facility] [phone]  and follow the prompts.  Office hours are 8:00 a.m. to 4:30 p.m. Monday - Friday. Please note that voicemails left after 4:00 p.m. may not be returned until the following business day.  We are closed weekends and major holidays. You have access to a nurse at all times for urgent questions. Please call the main number to the clinic [phone] and follow the prompts.  For any non-urgent questions, you may also contact your provider using MyChart. We now offer e-Visits for anyone 56 and older to request care online for non-urgent symptoms. For details visit [email or web address].   Also download the MyChart app! Go to the app store, search "MyChart", open the app, select [facility], and log in with your MyChart username and password.  Masks are optional in the cancer centers. If you would like for your care team to wear a mask while they are taking care of you, please let them know. You may have one support person who is at least 71 years old accompany you for your appointments.

## 2021-12-28 NOTE — Patient Instructions (Addendum)
[facility] Discharge Instructions   You were seen and examined today by Dr. Katragadda.  He reviewed the results of your lab work which are normal/stable.   We will proceed with your treatment today.  Return as scheduled.    Thank you for choosing  Can[facility] Center at [facility] to provide your oncology and hematology care.  To afford each patient quality time with our provider, please arrive at least 15 minutes before your scheduled appointment time.   If you have a lab appointment with the Cancer Center please come in thru the Main Entrance and check in at the main information desk.  You need to re-schedule your appointment should you arrive 10 or more minutes late.  We strive to give you quality time with our providers, and arriving late affects you and other patients whose appointments are after yours.  Also, if you no show three or more times for appointments you may be dismissed from the clinic at the providers discretion.     Again, thank you for choosing [facility].  Our hope is that these requests will decrease the amount of time that you wait before being seen by our physicians.       _____________________________________________________________  Should you have questions after your visit to [facility], please contact our office at [phone] and follow the prompts.  Our office hours are 8:00 a.m. and 4:30 p.m. Monday - Friday.  Please note that voicemails left after 4:00 p.m. may not be returned until the following business day.  We are closed weekends and major holidays.  You do have access to a nurse 24-7, just call the main number to the clinic [phone] and do not press any options, hold on the line and a nurse will answer the phone.    For prescription refill requests, have your pharmacy contact our office and allow 72 hours.    Due to Covid, you will need to wear a mask upon entering  the hospital. If you do not have a mask, a mask will be given to you at the Main Entrance upon arrival. For doctor visits, patients may have 1 support person age 18 or older with them. For treatment visits, patients can not have anyone with them due to social distancing guidelines and our immunocompromised population.      

## 2021-12-28 NOTE — Progress Notes (Signed)
Patient tolerated chemotherapy with no complaints voiced. Side effects with management reviewed understanding verbalized. Port site clean and dry with no bruising or swelling noted at site. Good blood return noted before and after administration of chemotherapy. Chemo pump connected with no alarms noted. Patient left in satisfactory condition with VSS and no s/s of distress noted.  °

## 2021-12-28 NOTE — Progress Notes (Signed)
Patient has been examined by Dr. Katragadda, and vital signs and labs have been reviewed. ANC, Creatinine, LFTs, hemoglobin, and platelets are within treatment parameters per M.D. - pt may proceed with treatment.  Primary RN and pharmacy notified.  

## 2021-12-30 ENCOUNTER — Inpatient Hospital Stay: Payer: Medicare Other

## 2021-12-30 VITALS — BP 144/77 | HR 67 | Temp 97.9°F | Resp 18

## 2021-12-30 DIAGNOSIS — Z452 Encounter for adjustment and management of vascular access device: Secondary | ICD-10-CM | POA: Diagnosis not present

## 2021-12-30 DIAGNOSIS — C183 Malignant neoplasm of hepatic flexure: Secondary | ICD-10-CM | POA: Diagnosis not present

## 2021-12-30 DIAGNOSIS — R197 Diarrhea, unspecified: Secondary | ICD-10-CM | POA: Diagnosis not present

## 2021-12-30 DIAGNOSIS — Z95828 Presence of other vascular implants and grafts: Secondary | ICD-10-CM

## 2021-12-30 DIAGNOSIS — N189 Chronic kidney disease, unspecified: Secondary | ICD-10-CM | POA: Diagnosis not present

## 2021-12-30 DIAGNOSIS — R911 Solitary pulmonary nodule: Secondary | ICD-10-CM | POA: Diagnosis not present

## 2021-12-30 DIAGNOSIS — Z5111 Encounter for antineoplastic chemotherapy: Secondary | ICD-10-CM | POA: Diagnosis not present

## 2021-12-30 DIAGNOSIS — D631 Anemia in chronic kidney disease: Secondary | ICD-10-CM | POA: Diagnosis not present

## 2021-12-30 MED ORDER — HEPARIN SOD (PORK) LOCK FLUSH 100 UNIT/ML IV SOLN
500.0000 [IU] | Freq: Once | INTRAVENOUS | Status: AC | PRN
  Administered 2021-12-30: 500 [IU]

## 2021-12-30 MED ORDER — SODIUM CHLORIDE 0.9% FLUSH
10.0000 mL | INTRAVENOUS | Status: DC | PRN
  Administered 2021-12-30: 10 mL

## 2021-12-30 NOTE — Patient Instructions (Signed)
[facility]  Discharge Instructions: Thank you for choosing [facility] to provide your oncology and hematology care.  If you have a lab appointment with the Cancer Center, please come in thru the Main Entrance and check in at the main information desk.  Wear comfortable clothing and clothing appropriate for easy access to any Portacath or PICC line.   We strive to give you quality time with your provider. You may need to reschedule your appointment if you arrive late (15 or more minutes).  Arriving late affects you and other patients whose appointments are after yours.  Also, if you miss three or more appointments without notifying the office, you may be dismissed from the clinic at the provider's discretion.      For prescription refill requests, have your pharmacy contact our office and allow 72 hours for refills to be completed.     To help prevent nausea and vomiting after your treatment, we encourage you to take your nausea medication as directed.  BELOW ARE SYMPTOMS THAT SHOULD BE REPORTED IMMEDIATELY: *FEVER GREATER THAN 100.4 F (38 C) OR HIGHER *CHILLS OR SWEATING *NAUSEA AND VOMITING THAT IS NOT CONTROLLED WITH YOUR NAUSEA MEDICATION *UNUSUAL SHORTNESS OF BREATH *UNUSUAL BRUISING OR BLEEDING *URINARY PROBLEMS (pain or burning when urinating, or frequent urination) *BOWEL PROBLEMS (unusual diarrhea, constipation, pain near the anus) TENDERNESS IN MOUTH AND THROAT WITH OR WITHOUT PRESENCE OF ULCERS (sore throat, sores in mouth, or a toothache) UNUSUAL RASH, SWELLING OR PAIN  UNUSUAL VAGINAL DISCHARGE OR ITCHING   Items with * indicate a potential emergency and should be followed up as soon as possible or go to the Emergency Department if any problems should occur.  Please show the CHEMOTHERAPY ALERT CARD or IMMUNOTHERAPY ALERT CARD at check-in to the Emergency Department and triage nurse.  Should you have questions after your visit or need to  cancel or reschedule your appointment, please contact [facility] [phone]  and follow the prompts.  Office hours are 8:00 a.m. to 4:30 p.m. Monday - Friday. Please note that voicemails left after 4:00 p.m. may not be returned until the following business day.  We are closed weekends and major holidays. You have access to a nurse at all times for urgent questions. Please call the main number to the clinic [phone] and follow the prompts.  For any non-urgent questions, you may also contact your provider using MyChart. We now offer e-Visits for anyone 18 and older to request care online for non-urgent symptoms. For details visit [email or web address].   Also download the MyChart app! Go to the app store, search "MyChart", open the app, select [facility], and log in with your MyChart username and password.  Masks are optional in the cancer centers. If you would like for your care team to wear a mask while they are taking care of you, please let them know. You may have one support person who is at least 71 years old accompany you for your appointments.  

## 2021-12-30 NOTE — Progress Notes (Signed)
Patients port flushed without difficulty.  Good blood return noted with no bruising or swelling noted at site.  Home infusion 5FU pump disconnected.  Band aid applied.  VSS with discharge and left in satisfactory condition with no s/s of distress noted.   °

## 2022-01-10 ENCOUNTER — Inpatient Hospital Stay: Payer: Medicare Other

## 2022-01-10 ENCOUNTER — Ambulatory Visit: Payer: Medicare Other | Admitting: Hematology

## 2022-01-10 VITALS — BP 146/75 | HR 63 | Temp 98.1°F | Resp 18 | Wt 170.3 lb

## 2022-01-10 DIAGNOSIS — R197 Diarrhea, unspecified: Secondary | ICD-10-CM | POA: Diagnosis not present

## 2022-01-10 DIAGNOSIS — R911 Solitary pulmonary nodule: Secondary | ICD-10-CM | POA: Diagnosis not present

## 2022-01-10 DIAGNOSIS — N189 Chronic kidney disease, unspecified: Secondary | ICD-10-CM | POA: Diagnosis not present

## 2022-01-10 DIAGNOSIS — Z95828 Presence of other vascular implants and grafts: Secondary | ICD-10-CM

## 2022-01-10 DIAGNOSIS — D631 Anemia in chronic kidney disease: Secondary | ICD-10-CM | POA: Diagnosis not present

## 2022-01-10 DIAGNOSIS — C183 Malignant neoplasm of hepatic flexure: Secondary | ICD-10-CM

## 2022-01-10 DIAGNOSIS — Z452 Encounter for adjustment and management of vascular access device: Secondary | ICD-10-CM | POA: Diagnosis not present

## 2022-01-10 DIAGNOSIS — Z5111 Encounter for antineoplastic chemotherapy: Secondary | ICD-10-CM | POA: Diagnosis not present

## 2022-01-10 LAB — CBC WITH DIFFERENTIAL/PLATELET
Abs Immature Granulocytes: 0.01 10*3/uL (ref 0.00–0.07)
Basophils Absolute: 0 10*3/uL (ref 0.0–0.1)
Basophils Relative: 0 %
Eosinophils Absolute: 0.1 10*3/uL (ref 0.0–0.5)
Eosinophils Relative: 2 %
HCT: 35.1 % — ABNORMAL LOW (ref 39.0–52.0)
Hemoglobin: 11.8 g/dL — ABNORMAL LOW (ref 13.0–17.0)
Immature Granulocytes: 0 %
Lymphocytes Relative: 39 %
Lymphs Abs: 1.9 10*3/uL (ref 0.7–4.0)
MCH: 33 pg (ref 26.0–34.0)
MCHC: 33.6 g/dL (ref 30.0–36.0)
MCV: 98 fL (ref 80.0–100.0)
Monocytes Absolute: 0.8 10*3/uL (ref 0.1–1.0)
Monocytes Relative: 17 %
Neutro Abs: 2 10*3/uL (ref 1.7–7.7)
Neutrophils Relative %: 42 %
Platelets: 119 10*3/uL — ABNORMAL LOW (ref 150–400)
RBC: 3.58 MIL/uL — ABNORMAL LOW (ref 4.22–5.81)
RDW: 15.6 % — ABNORMAL HIGH (ref 11.5–15.5)
WBC: 4.7 10*3/uL (ref 4.0–10.5)
nRBC: 0 % (ref 0.0–0.2)

## 2022-01-10 LAB — COMPREHENSIVE METABOLIC PANEL
ALT: 13 U/L (ref 0–44)
AST: 20 U/L (ref 15–41)
Albumin: 3.5 g/dL (ref 3.5–5.0)
Alkaline Phosphatase: 103 U/L (ref 38–126)
Anion gap: 4 — ABNORMAL LOW (ref 5–15)
BUN: 32 mg/dL — ABNORMAL HIGH (ref 8–23)
CO2: 19 mmol/L — ABNORMAL LOW (ref 22–32)
Calcium: 9 mg/dL (ref 8.9–10.3)
Chloride: 113 mmol/L — ABNORMAL HIGH (ref 98–111)
Creatinine, Ser: 1.59 mg/dL — ABNORMAL HIGH (ref 0.61–1.24)
GFR, Estimated: 46 mL/min — ABNORMAL LOW (ref >60)
Glucose, Bld: 93 mg/dL (ref 70–99)
Potassium: 4.3 mmol/L (ref 3.5–5.1)
Sodium: 136 mmol/L (ref 135–145)
Total Bilirubin: 0.3 mg/dL (ref 0.3–1.2)
Total Protein: 7.3 g/dL (ref 6.5–8.1)

## 2022-01-10 LAB — MAGNESIUM: Magnesium: 1.7 mg/dL (ref 1.7–2.4)

## 2022-01-10 MED ORDER — SODIUM CHLORIDE 0.9 % IV SOLN
1920.0000 mg/m2 | INTRAVENOUS | Status: DC
  Administered 2022-01-10: 3750 mg via INTRAVENOUS
  Filled 2022-01-10: qty 75

## 2022-01-10 MED ORDER — SODIUM CHLORIDE 0.9% FLUSH
10.0000 mL | INTRAVENOUS | Status: DC | PRN
  Administered 2022-01-10: 10 mL

## 2022-01-10 MED ORDER — OXALIPLATIN CHEMO INJECTION 100 MG/20ML
68.0000 mg/m2 | Freq: Once | INTRAVENOUS | Status: AC
  Administered 2022-01-10: 135 mg via INTRAVENOUS
  Filled 2022-01-10: qty 20

## 2022-01-10 MED ORDER — DEXTROSE 5 % IV SOLN: Freq: Once | INTRAVENOUS | Status: AC

## 2022-01-10 MED ORDER — PALONOSETRON HCL INJECTION 0.25 MG/5ML
0.2500 mg | Freq: Once | INTRAVENOUS | Status: AC
  Administered 2022-01-10: 0.25 mg via INTRAVENOUS
  Filled 2022-01-10: qty 5

## 2022-01-10 MED ORDER — FLUOROURACIL CHEMO INJECTION 2.5 GM/50ML
320.0000 mg/m2 | Freq: Once | INTRAVENOUS | Status: AC
  Administered 2022-01-10: 650 mg via INTRAVENOUS
  Filled 2022-01-10: qty 13

## 2022-01-10 MED ORDER — SODIUM CHLORIDE 0.9 % IV SOLN
10.0000 mg | Freq: Once | INTRAVENOUS | Status: AC
  Administered 2022-01-10: 10 mg via INTRAVENOUS
  Filled 2022-01-10: qty 10

## 2022-01-10 MED ORDER — LEUCOVORIN CALCIUM INJECTION 350 MG
320.0000 mg/m2 | Freq: Once | INTRAVENOUS | Status: AC
  Administered 2022-01-10: 628 mg via INTRAVENOUS
  Filled 2022-01-10: qty 31.4

## 2022-01-10 MED ORDER — HEPARIN SOD (PORK) LOCK FLUSH 100 UNIT/ML IV SOLN: 500.0000 [IU] | Freq: Once | INTRAVENOUS | Status: DC | PRN

## 2022-01-10 NOTE — Progress Notes (Signed)
Patient tolerated chemotherapy with no complaints voiced. Side effects with management reviewed understanding verbalized. Port site clean and dry with no bruising or swelling noted at site. Good blood return noted before and after administration of chemotherapy. Chemo pump connected with no alarms noted. Patient left in satisfactory condition with VSS and no s/s of distress noted.  °

## 2022-01-10 NOTE — Patient Instructions (Signed)
Fernando Dean  Discharge Instructions: Thank you for choosing [facility] to provide your oncology and hematology care.  If you have a lab appointment with the [facility], please come in thru the Main Entrance and check in at the main information desk.  Wear comfortable clothing and clothing appropriate for easy access to any Portacath or PICC line.   We strive to give you quality time with your provider. You may need to reschedule your appointment if you arrive late (15 or more minutes).  Arriving late affects you and other patients whose appointments are after yours.  Also, if you miss three or more appointments without notifying the office, you may be dismissed from the clinic at the provider's discretion.      For prescription refill requests, have your pharmacy contact our office and allow 72 hours for refills to be completed.    Today you received the following chemotherapy and/or immunotherapy agents FOLFOX, return as scheduled.   To help prevent nausea and vomiting after your treatment, we encourage you to take your nausea medication as directed.  BELOW ARE SYMPTOMS THAT SHOULD BE REPORTED IMMEDIATELY: *FEVER GREATER THAN 100.4 F (38 C) OR HIGHER *CHILLS OR SWEATING *NAUSEA AND VOMITING THAT IS NOT CONTROLLED WITH YOUR NAUSEA MEDICATION *UNUSUAL SHORTNESS OF BREATH *UNUSUAL BRUISING OR BLEEDING *URINARY PROBLEMS (pain or burning when urinating, or frequent urination) *BOWEL PROBLEMS (unusual diarrhea, constipation, pain near the anus) TENDERNESS IN MOUTH AND THROAT WITH OR WITHOUT PRESENCE OF ULCERS (sore throat, sores in mouth, or a toothache) UNUSUAL RASH, SWELLING OR PAIN  UNUSUAL VAGINAL DISCHARGE OR ITCHING   Items with * indicate a potential emergency and should be followed up as soon as possible or go to the Emergency Department if any problems should occur.  Please show the CHEMOTHERAPY ALERT CARD or IMMUNOTHERAPY ALERT CARD at  check-in to the Emergency Department and triage nurse.  Should you have questions after your visit or need to cancel or reschedule your appointment, please contact [facility] [phone]  and follow the prompts.  Office hours are 8:00 a.m. to 4:30 p.m. Monday - Friday. Please note that voicemails left after 4:00 p.m. may not be returned until the following business day.  We are closed weekends and major holidays. You have access to a nurse at all times for urgent questions. Please call the main number to the clinic [phone] and follow the prompts.  For any non-urgent questions, you may also contact your provider using MyChart. We now offer e-Visits for anyone 34 and older to request care online for non-urgent symptoms. For details visit [email or web address].   Also download the MyChart app! Go to the app store, search "MyChart", open the app, select [facility], and log in with your MyChart username and password.  Masks are optional in the cancer centers. If you would like for your care team to wear a mask while they are taking care of you, please let them know. You may have one support person who is at least 71 years old accompany you for your appointments.

## 2022-01-10 NOTE — Progress Notes (Signed)
Patient with chronic kidney disease - ok to proceed with elevated creatinine 1.59  Fernando Dean Dr Rhys Martini, PharmD

## 2022-01-11 LAB — CEA: CEA: 5 ng/mL — ABNORMAL HIGH (ref 0.0–4.7)

## 2022-01-12 ENCOUNTER — Inpatient Hospital Stay: Payer: Medicare Other

## 2022-01-12 VITALS — BP 121/77 | HR 66 | Temp 97.8°F | Resp 18

## 2022-01-12 DIAGNOSIS — Z452 Encounter for adjustment and management of vascular access device: Secondary | ICD-10-CM | POA: Diagnosis not present

## 2022-01-12 DIAGNOSIS — C183 Malignant neoplasm of hepatic flexure: Secondary | ICD-10-CM

## 2022-01-12 DIAGNOSIS — N189 Chronic kidney disease, unspecified: Secondary | ICD-10-CM | POA: Diagnosis not present

## 2022-01-12 DIAGNOSIS — R197 Diarrhea, unspecified: Secondary | ICD-10-CM | POA: Diagnosis not present

## 2022-01-12 DIAGNOSIS — R911 Solitary pulmonary nodule: Secondary | ICD-10-CM | POA: Diagnosis not present

## 2022-01-12 DIAGNOSIS — D631 Anemia in chronic kidney disease: Secondary | ICD-10-CM | POA: Diagnosis not present

## 2022-01-12 DIAGNOSIS — Z95828 Presence of other vascular implants and grafts: Secondary | ICD-10-CM

## 2022-01-12 DIAGNOSIS — Z5111 Encounter for antineoplastic chemotherapy: Secondary | ICD-10-CM | POA: Diagnosis not present

## 2022-01-12 MED ORDER — SODIUM CHLORIDE 0.9% FLUSH
10.0000 mL | INTRAVENOUS | Status: DC | PRN
  Administered 2022-01-12: 10 mL

## 2022-01-12 MED ORDER — HEPARIN SOD (PORK) LOCK FLUSH 100 UNIT/ML IV SOLN
500.0000 [IU] | Freq: Once | INTRAVENOUS | Status: AC | PRN
  Administered 2022-01-12: 500 [IU]

## 2022-01-12 NOTE — Progress Notes (Signed)
Patient presents today for pump d/c.  Port flushed with good blood return noted. No bruising or swelling at site. Bandaid applied and patient discharged in satisfactory condition. VVS stable with no signs or symptoms of distressed noted.  

## 2022-01-12 NOTE — Patient Instructions (Signed)
[facility]  Discharge Instructions: Thank you for choosing [facility] to provide your oncology and hematology care.  If you have a lab appointment with the [facility], please come in thru the Main Entrance and check in at the main information desk.  Wear comfortable clothing and clothing appropriate for easy access to any Portacath or PICC line.   We strive to give you quality time with your provider. You may need to reschedule your appointment if you arrive late (15 or more minutes).  Arriving late affects you and other patients whose appointments are after yours.  Also, if you miss three or more appointments without notifying the office, you may be dismissed from the clinic at the provider's discretion.      For prescription refill requests, have your pharmacy contact our office and allow 72 hours for refills to be completed.    Today you received the following pump d/c, return as scheduled.   To help prevent nausea and vomiting after your treatment, we encourage you to take your nausea medication as directed.  BELOW ARE SYMPTOMS THAT SHOULD BE REPORTED IMMEDIATELY: *FEVER GREATER THAN 100.4 F (38 C) OR HIGHER *CHILLS OR SWEATING *NAUSEA AND VOMITING THAT IS NOT CONTROLLED WITH YOUR NAUSEA MEDICATION *UNUSUAL SHORTNESS OF BREATH *UNUSUAL BRUISING OR BLEEDING *URINARY PROBLEMS (pain or burning when urinating, or frequent urination) *BOWEL PROBLEMS (unusual diarrhea, constipation, pain near the anus) TENDERNESS IN MOUTH AND THROAT WITH OR WITHOUT PRESENCE OF ULCERS (sore throat, sores in mouth, or a toothache) UNUSUAL RASH, SWELLING OR PAIN  UNUSUAL VAGINAL DISCHARGE OR ITCHING   Items with * indicate a potential emergency and should be followed up as soon as possible or go to the Emergency Department if any problems should occur.  Please show the CHEMOTHERAPY ALERT CARD or IMMUNOTHERAPY ALERT CARD at check-in to the Emergency Department and  triage nurse.  Should you have questions after your visit or need to cancel or reschedule your appointment, please contact [facility] [phone]  and follow the prompts.  Office hours are 8:00 a.m. to 4:30 p.m. Monday - Friday. Please note that voicemails left after 4:00 p.m. may not be returned until the following business day.  We are closed weekends and major holidays. You have access to a nurse at all times for urgent questions. Please call the main number to the clinic [phone] and follow the prompts.  For any non-urgent questions, you may also contact your provider using MyChart. We now offer e-Visits for anyone 30 and older to request care online for non-urgent symptoms. For details visit [email or web address].   Also download the MyChart app! Go to the app store, search "MyChart", open the app, select [facility], and log in with your MyChart username and password.  Masks are optional in the cancer centers. If you would like for your care team to wear a mask while they are taking care of you, please let them know. You may have one support person who is at least 71 years old accompany you for your appointments.

## 2022-01-26 ENCOUNTER — Inpatient Hospital Stay: Payer: Medicare Other

## 2022-01-26 ENCOUNTER — Inpatient Hospital Stay: Payer: Medicare Other | Attending: Hematology

## 2022-01-26 ENCOUNTER — Inpatient Hospital Stay (HOSPITAL_BASED_OUTPATIENT_CLINIC_OR_DEPARTMENT_OTHER): Payer: Medicare Other | Admitting: Hematology

## 2022-01-26 VITALS — BP 141/86 | HR 97 | Temp 97.4°F | Resp 18

## 2022-01-26 DIAGNOSIS — C183 Malignant neoplasm of hepatic flexure: Secondary | ICD-10-CM

## 2022-01-26 DIAGNOSIS — Z95828 Presence of other vascular implants and grafts: Secondary | ICD-10-CM | POA: Diagnosis not present

## 2022-01-26 DIAGNOSIS — N189 Chronic kidney disease, unspecified: Secondary | ICD-10-CM | POA: Diagnosis not present

## 2022-01-26 DIAGNOSIS — C185 Malignant neoplasm of splenic flexure: Secondary | ICD-10-CM | POA: Insufficient documentation

## 2022-01-26 DIAGNOSIS — Z5111 Encounter for antineoplastic chemotherapy: Secondary | ICD-10-CM | POA: Insufficient documentation

## 2022-01-26 DIAGNOSIS — D631 Anemia in chronic kidney disease: Secondary | ICD-10-CM | POA: Diagnosis not present

## 2022-01-26 DIAGNOSIS — Z452 Encounter for adjustment and management of vascular access device: Secondary | ICD-10-CM | POA: Diagnosis not present

## 2022-01-26 LAB — COMPREHENSIVE METABOLIC PANEL
ALT: 11 U/L (ref 0–44)
AST: 19 U/L (ref 15–41)
Albumin: 3.4 g/dL — ABNORMAL LOW (ref 3.5–5.0)
Alkaline Phosphatase: 122 U/L (ref 38–126)
Anion gap: 7 (ref 5–15)
BUN: 25 mg/dL — ABNORMAL HIGH (ref 8–23)
CO2: 18 mmol/L — ABNORMAL LOW (ref 22–32)
Calcium: 8.7 mg/dL — ABNORMAL LOW (ref 8.9–10.3)
Chloride: 110 mmol/L (ref 98–111)
Creatinine, Ser: 1.69 mg/dL — ABNORMAL HIGH (ref 0.61–1.24)
GFR, Estimated: 43 mL/min — ABNORMAL LOW (ref >60)
Glucose, Bld: 89 mg/dL (ref 70–99)
Potassium: 4.5 mmol/L (ref 3.5–5.1)
Sodium: 135 mmol/L (ref 135–145)
Total Bilirubin: 0.5 mg/dL (ref 0.3–1.2)
Total Protein: 7.4 g/dL (ref 6.5–8.1)

## 2022-01-26 LAB — CBC WITH DIFFERENTIAL/PLATELET
Abs Immature Granulocytes: 0.01 10*3/uL (ref 0.00–0.07)
Basophils Absolute: 0.1 10*3/uL (ref 0.0–0.1)
Basophils Relative: 1 %
Eosinophils Absolute: 0.1 10*3/uL (ref 0.0–0.5)
Eosinophils Relative: 2 %
HCT: 36.2 % — ABNORMAL LOW (ref 39.0–52.0)
Hemoglobin: 12.1 g/dL — ABNORMAL LOW (ref 13.0–17.0)
Immature Granulocytes: 0 %
Lymphocytes Relative: 46 %
Lymphs Abs: 2.1 10*3/uL (ref 0.7–4.0)
MCH: 32.8 pg (ref 26.0–34.0)
MCHC: 33.4 g/dL (ref 30.0–36.0)
MCV: 98.1 fL (ref 80.0–100.0)
Monocytes Absolute: 0.8 10*3/uL (ref 0.1–1.0)
Monocytes Relative: 18 %
Neutro Abs: 1.5 10*3/uL — ABNORMAL LOW (ref 1.7–7.7)
Neutrophils Relative %: 33 %
Platelets: 112 10*3/uL — ABNORMAL LOW (ref 150–400)
RBC: 3.69 MIL/uL — ABNORMAL LOW (ref 4.22–5.81)
RDW: 15.7 % — ABNORMAL HIGH (ref 11.5–15.5)
WBC: 4.5 10*3/uL (ref 4.0–10.5)
nRBC: 0 % (ref 0.0–0.2)

## 2022-01-26 LAB — MAGNESIUM: Magnesium: 2 mg/dL (ref 1.7–2.4)

## 2022-01-26 MED ORDER — LEUCOVORIN CALCIUM INJECTION 350 MG
320.0000 mg/m2 | Freq: Once | INTRAVENOUS | Status: AC
  Administered 2022-01-26: 628 mg via INTRAVENOUS
  Filled 2022-01-26: qty 31.4

## 2022-01-26 MED ORDER — DEXTROSE 5 % IV SOLN: Freq: Once | INTRAVENOUS | Status: AC

## 2022-01-26 MED ORDER — PALONOSETRON HCL INJECTION 0.25 MG/5ML
0.2500 mg | Freq: Once | INTRAVENOUS | Status: AC
  Administered 2022-01-26: 0.25 mg via INTRAVENOUS
  Filled 2022-01-26: qty 5

## 2022-01-26 MED ORDER — SODIUM CHLORIDE 0.9 % IV SOLN
1920.0000 mg/m2 | INTRAVENOUS | Status: DC
  Administered 2022-01-26: 3750 mg via INTRAVENOUS
  Filled 2022-01-26: qty 75

## 2022-01-26 MED ORDER — SODIUM CHLORIDE 0.9% FLUSH
10.0000 mL | INTRAVENOUS | Status: DC | PRN
  Administered 2022-01-26: 10 mL via INTRAVENOUS

## 2022-01-26 MED ORDER — SODIUM CHLORIDE 0.9 % IV SOLN
10.0000 mg | Freq: Once | INTRAVENOUS | Status: AC
  Administered 2022-01-26: 10 mg via INTRAVENOUS
  Filled 2022-01-26: qty 10

## 2022-01-26 MED ORDER — OXALIPLATIN CHEMO INJECTION 100 MG/20ML
68.0000 mg/m2 | Freq: Once | INTRAVENOUS | Status: AC
  Administered 2022-01-26: 135 mg via INTRAVENOUS
  Filled 2022-01-26: qty 20

## 2022-01-26 MED ORDER — FLUOROURACIL CHEMO INJECTION 2.5 GM/50ML
320.0000 mg/m2 | Freq: Once | INTRAVENOUS | Status: AC
  Administered 2022-01-26: 650 mg via INTRAVENOUS
  Filled 2022-01-26: qty 13

## 2022-01-26 NOTE — Patient Instructions (Signed)
[facility]  Discharge Instructions: Thank you for choosing [facility] to provide your oncology and hematology care.  If you have a lab appointment with the Cancer Center, please come in thru the Main Entrance and check in at the main information desk.  Wear comfortable clothing and clothing appropriate for easy access to any Portacath or PICC line.   We strive to give you quality time with your provider. You may need to reschedule your appointment if you arrive late (15 or more minutes).  Arriving late affects you and other patients whose appointments are after yours.  Also, if you miss three or more appointments without notifying the office, you may be dismissed from the clinic at the provider's discretion.      For prescription refill requests, have your pharmacy contact our office and allow 72 hours for refills to be completed.    Today you received the following chemotherapy and/or immunotherapy agents Folfox   To help prevent nausea and vomiting after your treatment, we encourage you to take your nausea medication as directed.   Oxaliplatin Injection What is this medication? OXALIPLATIN (ox AL i PLA tin) treats some types of cancer. It works by slowing down the growth of cancer cells. This medicine may be used for other purposes; ask your health care provider or pharmacist if you have questions. COMMON BRAND NAME(S): Eloxatin What should I tell my care team before I take this medication? They need to know if you have any of these conditions: Heart disease History of irregular heartbeat or rhythm Liver disease Low blood cell levels (white cells, red cells, and platelets) Lung or breathing disease, such as asthma Take medications that treat or prevent blood clots Tingling of the fingers, toes, or other nerve disorder An unusual or allergic reaction to oxaliplatin, other medications, foods, dyes, or preservatives If you or your partner are  pregnant or trying to get pregnant Breast-feeding How should I use this medication? This medication is injected into a vein. It is given by your care team in a hospital or clinic setting. Talk to your care team about the use of this medication in children. Special care may be needed. Overdosage: If you think you have taken too much of this medicine contact a poison control center or emergency room at once. NOTE: This medicine is only for you. Do not share this medicine with others. What if I miss a dose? Keep appointments for follow-up doses. It is important not to miss a dose. Call your care team if you are unable to keep an appointment. What may interact with this medication? Do not take this medication with any of the following: Cisapride Dronedarone Pimozide Thioridazine This medication may also interact with the following: Aspirin and aspirin-like medications Certain medications that treat or prevent blood clots, such as warfarin, apixaban, dabigatran, and rivaroxaban Cisplatin Cyclosporine Diuretics Medications for infection, such as acyclovir, adefovir, amphotericin B, bacitracin, cidofovir, foscarnet, ganciclovir, gentamicin, pentamidine, vancomycin NSAIDs, medications for pain and inflammation, such as ibuprofen or naproxen Other medications that cause heart rhythm changes Pamidronate Zoledronic acid This list may not describe all possible interactions. Give your health care provider a list of all the medicines, herbs, non-prescription drugs, or dietary supplements you use. Also tell them if you smoke, drink alcohol, or use illegal drugs. Some items may interact with your medicine. What should I watch for while using this medication? Your condition will be monitored carefully while you are receiving this medication. You may   need blood work while taking this medication. This medication may make you feel generally unwell. This is not uncommon as chemotherapy can affect healthy  cells as well as cancer cells. Report any side effects. Continue your course of treatment even though you feel ill unless your care team tells you to stop. This medication may increase your risk of getting an infection. Call your care team for advice if you get a fever, chills, sore throat, or other symptoms of a cold or flu. Do not treat yourself. Try to avoid being around people who are sick. Avoid taking medications that contain aspirin, acetaminophen, ibuprofen, naproxen, or ketoprofen unless instructed by your care team. These medications may hide a fever. Be careful brushing or flossing your teeth or using a toothpick because you may get an infection or bleed more easily. If you have any dental work done, tell your dentist you are receiving this medication. This medication can make you more sensitive to cold. Do not drink cold drinks or use ice. Cover exposed skin before coming in contact with cold temperatures or cold objects. When out in cold weather wear warm clothing and cover your mouth and nose to warm the air that goes into your lungs. Tell your care team if you get sensitive to the cold. Talk to your care team if you or your partner are pregnant or think either of you might be pregnant. This medication can cause serious birth defects if taken during pregnancy and for 9 months after the last dose. A negative pregnancy test is required before starting this medication. A reliable form of contraception is recommended while taking this medication and for 9 months after the last dose. Talk to your care team about effective forms of contraception. Do not father a child while taking this medication and for 6 months after the last dose. Use a condom while having sex during this time period. Do not breastfeed while taking this medication and for 3 months after the last dose. This medication may cause infertility. Talk to your care team if you are concerned about your fertility. What side effects may I  notice from receiving this medication? Side effects that you should report to your care team as soon as possible: Allergic reactions--skin rash, itching, hives, swelling of the face, lips, tongue, or throat Bleeding--bloody or black, tar-like stools, vomiting blood or brown material that looks like coffee grounds, red or dark brown urine, small red or purple spots on skin, unusual bruising or bleeding Dry cough, shortness of breath or trouble breathing Heart rhythm changes--fast or irregular heartbeat, dizziness, feeling faint or lightheaded, chest pain, trouble breathing Infection--fever, chills, cough, sore throat, wounds that don't heal, pain or trouble when passing urine, general feeling of discomfort or being unwell Liver injury--right upper belly pain, loss of appetite, nausea, light-colored stool, dark yellow or brown urine, yellowing skin or eyes, unusual weakness or fatigue Low red blood cell level--unusual weakness or fatigue, dizziness, headache, trouble breathing Muscle injury--unusual weakness or fatigue, muscle pain, dark yellow or brown urine, decrease in amount of urine Pain, tingling, or numbness in the hands or feet Sudden and severe headache, confusion, change in vision, seizures, which may be signs of posterior reversible encephalopathy syndrome (PRES) Unusual bruising or bleeding Side effects that usually do not require medical attention (report to your care team if they continue or are bothersome): Diarrhea Nausea Pain, redness, or swelling with sores inside the mouth or throat Unusual weakness or fatigue Vomiting This list may not describe   all possible side effects. Call your doctor for medical advice about side effects. You may report side effects to FDA at 1-800-FDA-1088. Where should I keep my medication? This medication is given in a hospital or clinic. It will not be stored at home. NOTE: This sheet is a summary. It may not cover all possible information. If you have  questions about this medicine, talk to your doctor, pharmacist, or health care provider.  2023 Elsevier/Gold Standard (2007-03-31 00:00:00)  Leucovorin Injection What is this medication? LEUCOVORIN (loo koe VOR in) prevents side effects from certain medications, such as methotrexate. It works by increasing folate levels. This helps protect healthy cells in your body. It may also be used to treat anemia caused by low levels of folate. It can also be used with fluorouracil, a type of chemotherapy, to treat colorectal cancer. It works by increasing the effects of fluorouracil in the body. This medicine may be used for other purposes; ask your health care provider or pharmacist if you have questions. What should I tell my care team before I take this medication? They need to know if you have any of these conditions: Anemia from low levels of vitamin B12 in the blood An unusual or allergic reaction to leucovorin, folic acid, other medications, foods, dyes, or preservatives Pregnant or trying to get pregnant Breastfeeding How should I use this medication? This medication is injected into a vein or a muscle. It is given by your care team in a hospital or clinic setting. Talk to your care team about the use of this medication in children. Special care may be needed. Overdosage: If you think you have taken too much of this medicine contact a poison control center or emergency room at once. NOTE: This medicine is only for you. Do not share this medicine with others. What if I miss a dose? Keep appointments for follow-up doses. It is important not to miss your dose. Call your care team if you are unable to keep an appointment. What may interact with this medication? Capecitabine Fluorouracil Phenobarbital Phenytoin Primidone Trimethoprim;sulfamethoxazole This list may not describe all possible interactions. Give your health care provider a list of all the medicines, herbs, non-prescription drugs, or  dietary supplements you use. Also tell them if you smoke, drink alcohol, or use illegal drugs. Some items may interact with your medicine. What should I watch for while using this medication? Your condition will be monitored carefully while you are receiving this medication. This medication may increase the side effects of 5-fluorouracil. Tell your care team if you have diarrhea or mouth sores that do not get better or that get worse. What side effects may I notice from receiving this medication? Side effects that you should report to your care team as soon as possible: Allergic reactions--skin rash, itching, hives, swelling of the face, lips, tongue, or throat This list may not describe all possible side effects. Call your doctor for medical advice about side effects. You may report side effects to FDA at 1-800-FDA-1088. Where should I keep my medication? This medication is given in a hospital or clinic. It will not be stored at home. NOTE: This sheet is a summary. It may not cover all possible information. If you have questions about this medicine, talk to your doctor, pharmacist, or health care provider.  2023 Elsevier/Gold Standard (2021-07-13 00:00:00)  Fluorouracil Injection What is this medication? FLUOROURACIL (flure oh YOOR a sil) treats some types of cancer. It works by slowing down the growth of cancer   cells. This medicine may be used for other purposes; ask your health care provider or pharmacist if you have questions. COMMON BRAND NAME(S): Adrucil What should I tell my care team before I take this medication? They need to know if you have any of these conditions: Blood disorders Dihydropyrimidine dehydrogenase (DPD) deficiency Infection, such as chickenpox, cold sores, herpes Kidney disease Liver disease Poor nutrition Recent or ongoing radiation therapy An unusual or allergic reaction to fluorouracil, other medications, foods, dyes, or preservatives If you or your partner  are pregnant or trying to get pregnant Breast-feeding How should I use this medication? This medication is injected into a vein. It is administered by your care team in a hospital or clinic setting. Talk to your care team about the use of this medication in children. Special care may be needed. Overdosage: If you think you have taken too much of this medicine contact a poison control center or emergency room at once. NOTE: This medicine is only for you. Do not share this medicine with others. What if I miss a dose? Keep appointments for follow-up doses. It is important not to miss your dose. Call your care team if you are unable to keep an appointment. What may interact with this medication? Do not take this medication with any of the following: Live virus vaccines This medication may also interact with the following: Medications that treat or prevent blood clots, such as warfarin, enoxaparin, dalteparin This list may not describe all possible interactions. Give your health care provider a list of all the medicines, herbs, non-prescription drugs, or dietary supplements you use. Also tell them if you smoke, drink alcohol, or use illegal drugs. Some items may interact with your medicine. What should I watch for while using this medication? Your condition will be monitored carefully while you are receiving this medication. This medication may make you feel generally unwell. This is not uncommon as chemotherapy can affect healthy cells as well as cancer cells. Report any side effects. Continue your course of treatment even though you feel ill unless your care team tells you to stop. In some cases, you may be given additional medications to help with side effects. Follow all directions for their use. This medication may increase your risk of getting an infection. Call your care team for advice if you get a fever, chills, sore throat, or other symptoms of a cold or flu. Do not treat yourself. Try to  avoid being around people who are sick. This medication may increase your risk to bruise or bleed. Call your care team if you notice any unusual bleeding. Be careful brushing or flossing your teeth or using a toothpick because you may get an infection or bleed more easily. If you have any dental work done, tell your dentist you are receiving this medication. Avoid taking medications that contain aspirin, acetaminophen, ibuprofen, naproxen, or ketoprofen unless instructed by your care team. These medications may hide a fever. Do not treat diarrhea with over the counter products. Contact your care team if you have diarrhea that lasts more than 2 days or if it is severe and watery. This medication can make you more sensitive to the sun. Keep out of the sun. If you cannot avoid being in the sun, wear protective clothing and sunscreen. Do not use sun lamps, tanning beds, or tanning booths. Talk to your care team if you or your partner wish to become pregnant or think you might be pregnant. This medication can cause serious birth defects   if taken during pregnancy and for 3 months after the last dose. A reliable form of contraception is recommended while taking this medication and for 3 months after the last dose. Talk to your care team about effective forms of contraception. Do not father a child while taking this medication and for 3 months after the last dose. Use a condom while having sex during this time period. Do not breastfeed while taking this medication. This medication may cause infertility. Talk to your care team if you are concerned about your fertility. What side effects may I notice from receiving this medication? Side effects that you should report to your care team as soon as possible: Allergic reactions--skin rash, itching, hives, swelling of the face, lips, tongue, or throat Heart attack--pain or tightness in the chest, shoulders, arms, or jaw, nausea, shortness of breath, cold or clammy  skin, feeling faint or lightheaded Heart failure--shortness of breath, swelling of the ankles, feet, or hands, sudden weight gain, unusual weakness or fatigue Heart rhythm changes--fast or irregular heartbeat, dizziness, feeling faint or lightheaded, chest pain, trouble breathing High ammonia level--unusual weakness or fatigue, confusion, loss of appetite, nausea, vomiting, seizures Infection--fever, chills, cough, sore throat, wounds that don't heal, pain or trouble when passing urine, general feeling of discomfort or being unwell Low red blood cell level--unusual weakness or fatigue, dizziness, headache, trouble breathing Pain, tingling, or numbness in the hands or feet, muscle weakness, change in vision, confusion or trouble speaking, loss of balance or coordination, trouble walking, seizures Redness, swelling, and blistering of the skin over hands and feet Severe or prolonged diarrhea Unusual bruising or bleeding Side effects that usually do not require medical attention (report to your care team if they continue or are bothersome): Dry skin Headache Increased tears Nausea Pain, redness, or swelling with sores inside the mouth or throat Sensitivity to light Vomiting This list may not describe all possible side effects. Call your doctor for medical advice about side effects. You may report side effects to FDA at 1-800-FDA-1088. Where should I keep my medication? This medication is given in a hospital or clinic. It will not be stored at home. NOTE: This sheet is a summary. It may not cover all possible information. If you have questions about this medicine, talk to your doctor, pharmacist, or health care provider.  2023 Elsevier/Gold Standard (2021-06-08 00:00:00)    BELOW ARE SYMPTOMS THAT SHOULD BE REPORTED IMMEDIATELY: *FEVER GREATER THAN 100.4 F (38 C) OR HIGHER *CHILLS OR SWEATING *NAUSEA AND VOMITING THAT IS NOT CONTROLLED WITH YOUR NAUSEA MEDICATION *UNUSUAL SHORTNESS OF  BREATH *UNUSUAL BRUISING OR BLEEDING *URINARY PROBLEMS (pain or burning when urinating, or frequent urination) *BOWEL PROBLEMS (unusual diarrhea, constipation, pain near the anus) TENDERNESS IN MOUTH AND THROAT WITH OR WITHOUT PRESENCE OF ULCERS (sore throat, sores in mouth, or a toothache) UNUSUAL RASH, SWELLING OR PAIN  UNUSUAL VAGINAL DISCHARGE OR ITCHING   Items with * indicate a potential emergency and should be followed up as soon as possible or go to the Emergency Department if any problems should occur.  Please show the CHEMOTHERAPY ALERT CARD or IMMUNOTHERAPY ALERT CARD at check-in to the Emergency Department and triage nurse.  Should you have questions after your visit or need to cancel or reschedule your appointment, please contact [facility] [phone]  and follow the prompts.  Office hours are 8:00 a.m. to 4:30 p.m. Monday - Friday. Please note that voicemails left after 4:00 p.m. may not be returned until the following   day.  We are closed weekends and major holidays. You have access to a nurse at all times for urgent questions. Please call the main number to the clinic [phone] and follow the prompts.  For any non-urgent questions, you may also contact your provider using MyChart. We now offer e-Visits for anyone 39 and older to request care online for non-urgent symptoms. For details visit [email or web address].   Also download the MyChart app! Go to the app store, search "MyChart", open the app, select [facility], and log in with your MyChart username and password.  Masks are optional in the cancer centers. If you would like for your care team to wear a mask while they are taking care of you, please let them know. You may have one support person who is at least 71 years old accompany you for your appointments.

## 2022-01-26 NOTE — Progress Notes (Signed)
Patients port flushed without difficulty.  Good blood return noted with no bruising or swelling noted at site.  Patient remains accessed for chemotherapy treatment.  

## 2022-01-26 NOTE — Progress Notes (Signed)
Pt presents today for Folfox per provider's order. Vital signs and labs WNL for treatment. Okay to proceed with treatment today per Dr.K.  Folfox given today per MD orders. Tolerated infusion without adverse affects. Vital signs stable. No complaints at this time. Discharged from clinic ambulatory in stable condition. Alert and oriented x 3. F/U with [facility] as scheduled. 5FU ambulatory pump infusing.

## 2022-01-26 NOTE — Progress Notes (Signed)
[place], [place] [address]   CLINIC:  Medical Oncology/Hematology  PCP:  [name], FNP [name] [place] [address] [phone]   REASON FOR VISIT:  Follow-up for stage 3B right colon cancer  PRIOR THERAPY: Resection of splenic flexure mass on 04/23/2021 by Dr. Marcello Moores, right colectomy on 08/18/2021  NGS Results:  MSI-stable  CURRENT THERAPY: Adjuvant FOLFOX  CANCER STAGING:  Cancer Staging  Cancer of left colon [facility]) Staging form: Colon and Rectum, AJCC 8th Edition - Clinical stage from 05/11/2021: Stage IIA (cT3, cN0, cM0) - Unsigned  Primary cancer of hepatic flexure s/p robotic right colectomy 08/18/2021 Staging form: Colon and Rectum, AJCC 8th Edition - Clinical stage from 09/23/2021: Stage IIIB (cT4a, cN1a, cM0) - Unsigned   INTERVAL HISTORY:  Fernando Dean, a 71 y.o. male, seen for follow-up and toxicity assessment prior to next cycle of FOLFOX chemotherapy.  Denies any tingling or numbness in the extremities.  Reports energy levels of 70%.  Reports cold sensitivity lasting for 2 to 3 days.  REVIEW OF SYSTEMS:  Review of Systems  All other systems reviewed and are negative.   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 - [employer] MRI 2272 dual chamber PPM   Tuberculosis    as a child, was treated   Past Surgical History:  Procedure Laterality Date   ABDOMINAL AORTIC ENDOVASCULAR STENT GRAFT Bilateral 02/26/2021   Procedure: ABDOMINAL AORTIC ENDOVASCULAR STENT GRAFT REPAIR;  Surgeon: Marty Heck, MD;  [facility]: [facility];  Service: Vascular;  Laterality:  Bilateral;   BIOPSY  03/12/2021   Procedure: BIOPSY;  Surgeon: Harvel Quale, MD;  [facility];  Service: Gastroenterology;;   BIOPSY  06/25/2021   Procedure: BIOPSY;  Surgeon: Harvel Quale, MD;  [facility];  Service: Gastroenterology;;  mass    COLONOSCOPY WITH PROPOFOL N/A 03/12/2021   Procedure: COLONOSCOPY WITH PROPOFOL;  Surgeon: Harvel Quale, MD;  [facility];  Service: Gastroenterology;  Laterality: N/A;  10:55   COLONOSCOPY WITH PROPOFOL N/A 06/25/2021   Procedure: COLONOSCOPY WITH PROPOFOL;  Surgeon: Harvel Quale, MD;  [facility];  Service: Gastroenterology;  Laterality: N/A;  235 ASA 2   HEMOSTASIS CLIP PLACEMENT  03/12/2021   Procedure: HEMOSTASIS CLIP PLACEMENT;  Surgeon: Harvel Quale, MD;  [facility];  Service: Gastroenterology;;   HEMOSTASIS CLIP PLACEMENT  06/25/2021   Procedure: HEMOSTASIS CLIP PLACEMENT;  Surgeon: Harvel Quale, MD;  [facility];  Service: Gastroenterology;;   HERNIA REPAIR Left 10/25/2006   IR IMAGING GUIDED PORT INSERTION  09/29/2021   PACEMAKER IMPLANT N/A 02/08/2021   Procedure: PACEMAKER IMPLANT;  Surgeon: Deboraha Sprang, MD;  Location: [facility];  Service: Cardiovascular;  Laterality: N/A;   POLYPECTOMY  03/12/2021   Procedure: POLYPECTOMY;  Surgeon: Harvel Quale, MD;  [facility];  Service: Gastroenterology;;   POLYPECTOMY  06/25/2021   Procedure: POLYPECTOMY;  Surgeon: Harvel Quale, MD;  [facility];  Service: Gastroenterology;;   [name]  03/12/2021   Procedure: [name];  Surgeon: [name], [name], MD;  [facility];  Service: Gastroenterology;;    SOCIAL HISTORY:  Social History   Socioeconomic History   Marital status: Single    Spouse name: Not on file   Number of children: Not on file   Years of education: Not on  file   Highest education level: Not on file  Occupational History   Not on file  Tobacco Use   Smoking status: Every Day    Packs/day: 0.75    Years: 50.00    Total pack years: 37.50    Types: Cigarettes    Passive exposure: Current   Smokeless tobacco: Never  Vaping Use   Vaping Use: Never used  Substance and Sexual Activity   Alcohol use: Yes    Alcohol/week: 2.0 - 3.0 standard drinks of alcohol    Types: 2 - 3 Shots of liquor per week    Comment: daily liquor moderatly   Drug use: Not on file    Comment: uses every other day   Sexual activity: Not Currently  Other Topics Concern   Not on file  Social History Narrative   Not on file   Social Determinants of Health   Financial Resource Strain: High Risk (10/14/2021)   Overall Financial Resource Strain (CARDIA)    Difficulty of Paying Living Expenses: Hard  Food Insecurity: Not on file  Transportation Needs: Not on file  Physical Activity: Not on file  Stress: Not on file  Social Connections: Not on file  Intimate Partner Violence: Not on file    FAMILY HISTORY:  Family History  Problem Relation Age of Onset   Heart disease Father    Colon polyps Sister        less than 10 lifetime polyps   Colon cancer Maternal Grandmother        dx 90s    CURRENT MEDICATIONS:  Current Outpatient Medications  Medication Sig Dispense Refill   albuterol (VENTOLIN HFA) 108 (90 Base) MCG/ACT inhaler Inhale 2 puffs into the lungs every 6 (six) hours as needed for wheezing or shortness of breath. 8 g 2   amLODipine (NORVASC) 2.5 MG tablet TAKE 1 TABLET(2.5 MG) BY MOUTH DAILY 90 tablet 3   aspirin EC 81 MG EC tablet Take 1 tablet (81 mg total) by mouth daily at 6 (six) AM. Swallow whole. 30 tablet 11   bisacodyl (DULCOLAX) 5 MG EC tablet Take 5 mg by mouth daily as needed for moderate constipation.     carvedilol (COREG) 6.25 MG tablet Take 1 tablet (6.25 mg total) by mouth 2 (two) times daily with a meal. 60 tablet 3   famotidine  (PEPCID) 20 MG tablet Take 20 mg by mouth daily as needed for heartburn or indigestion.     feeding supplement (ENSURE ENLIVE / ENSURE PLUS) LIQD Take 237 mLs by mouth 3 (three) times daily between meals. (Patient taking differently: Take 237 mLs by mouth 3 (three) times daily as needed (nutrition).) 237 mL 12   fluorouracil CALGB 89211 2,400 mg/m2 in sodium chloride 0.9 % 150 mL Inject 2,400 mg/m2 into the vein over 48 hr. Every 14 days     FLUOROURACIL IV Inject into the vein every 14 (fourteen) days.     LEUCOVORIN CALCIUM IV Inject into the vein every 14 (fourteen) days.     loperamide (IMODIUM A-D) 2 MG tablet Take 1 tablet (2 mg total) by mouth 4 (  four) times daily as needed for diarrhea or loose stools.  0   nicotine (NICODERM CQ - DOSED IN MG/24 HOURS) 21 mg/24hr patch Place 21 mg onto the skin daily.     OXALIPLATIN IV Inject into the vein every 14 (fourteen) days.     prochlorperazine (COMPAZINE) 10 MG tablet      simvastatin (ZOCOR) 20 MG tablet Take 20 mg by mouth daily.     No current facility-administered medications for this visit.   Facility-Administered Medications Ordered in Other Visits  Medication Dose Route Frequency Provider Last Rate Last Admin   magnesium sulfate 2 GM/50ML IVPB            palonosetron (ALOXI) 0.25 MG/5ML injection            sodium chloride flush (NS) 0.9 % injection 10 mL  10 mL Intracatheter PRN Fernando Jack, MD   10 mL at 11/18/21 1257    ALLERGIES:  Allergies  Allergen Reactions   Lisinopril Anaphylaxis   Naproxen Shortness Of Breath and Palpitations   Nsaids Shortness Of Breath and Palpitations    PHYSICAL EXAM:  Performance status (ECOG): 1 - Symptomatic but completely ambulatory  There were no vitals filed for this visit.   Wt Readings from Last 3 Encounters:  01/26/22 167 lb 9.6 oz (76 kg)  01/10/22 170 lb 4.8 oz (77.2 kg)  12/28/21 170 lb (77.1 kg)   Physical Exam Vitals reviewed.  Constitutional:      Appearance:  Normal appearance.  Cardiovascular:     Rate and Rhythm: Normal rate and regular rhythm.     Pulses: Normal pulses.     Heart sounds: Normal heart sounds.  Pulmonary:     Effort: Pulmonary effort is normal.     Breath sounds: Normal breath sounds.  Neurological:     General: No focal deficit present.     Mental Status: He is alert and oriented to person, place, and time.  Psychiatric:        Mood and Affect: Mood normal.        Behavior: Behavior normal.      LABORATORY DATA:  I have reviewed the labs as listed.     Latest Ref Rng & Units 01/26/2022   10:19 AM 01/10/2022    9:11 AM 12/28/2021    9:48 AM  CBC  WBC 4.0 - 10.5 K/uL 4.5  4.7  5.1   Hemoglobin 13.0 - 17.0 g/dL 12.1  11.8  12.7   Hematocrit 39.0 - 52.0 % 36.2  35.1  37.8   Platelets 150 - 400 K/uL 112  119  149       Latest Ref Rng & Units 01/10/2022    9:11 AM 12/28/2021    9:48 AM 12/14/2021    9:13 AM  CMP  Glucose 70 - 99 mg/dL 93  102  124   BUN 8 - 23 mg/dL 32  21  17   Creatinine 0.61 - 1.24 mg/dL 1.59  1.51  1.55   Sodium 135 - 145 mmol/L 136  140  138   Potassium 3.5 - 5.1 mmol/L 4.3  4.3  4.3   Chloride 98 - 111 mmol/L 113  110  111   CO2 22 - 32 mmol/L _0 Calcium 8.9 - 10.3 mg/dL 9.0  9.1  9.0   Total Protein 6.5 - 8.1 g/dL 7.3  7.5  7.3   Total Bilirubin 0.3 - 1.2 mg/dL 0.3  0.7  0.7   Alkaline Phos 38 - 126 U/L 103  121  113   AST 15 - 41 U/L _0 ALT 0 - 44 U/L _1 DIAGNOSTIC IMAGING:  I have independently reviewed the scans and discussed with the patient. No results found.   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 - Colonoscopy on 03/12/2021: Fungating partially obstructing large mass found at 40 cm proximal to the anus, circumferential with no bleeding present. - CEA on 03/12/2021: 8.3 - CT angio abdomen and pelvis on 04/08/2021: No evidence of metastatic lymphadenopathy or liver mets.  Stable  7 mm subpleural pulmonary nodule in the periphery of the right lower lobe. - CT angio CAP on 02/04/2021: No evidence of lung metastasis, abdominal metastasis. - Resection of splenic flexure mass on 04/23/2021 by Dr. Marcello Moores. - Pathology: Moderately differentiated adenocarcinoma, invades into subserosa, margins negative, 0/38 lymph nodes.  No LVI or perineural invasion.  PT3 pN0.  MSI-stable. - Guardant reveal (05/21/2021): CT DNA was detected. - PET scan on 06/10/2021 shows 2 cm hypermetabolic soft tissue density in the transverse colon.  No evidence of local or distant metastatic disease.    Social/family history: - He is a retired [employer]. - Current active smoker, half pack per day for 50 years. - Mother had colon cancer at age 39.  Maternal grandmother had colon cancer.  3.  Stage III (H0TU8) right colon adenocarcinoma: - Right colectomy on 08/10/2021 by Dr. Marcello Moores. - Pathology with grade 2 moderately differentiated colonic adenocarcinoma, tumor site ascending colon, margins negative, 1/22 lymph nodes involved, [id number].  MMR preserved.        -Cycle 1 of FOLFOX started on 10/05/2021   PLAN:  Stage III (T4AN1) right colon adenocarcinoma, MMR preserved: - He tolerated 7 cycle of FOLFOX reasonably well. - He has cold sensitivity lasting for 2 to 3 days but denies any tingling or numbness in extremities. - Reviewed labs today which showed normal LFTs.  CBC was grossly normal with mild thrombocytopenia. - Proceed with cycle 8 today with 20% dose reduction.  RTC 4 weeks for follow-up.  2.  Normocytic anemia: - Combination anemia from CKD and relative iron deficiency. - Hemoglobin is 12.1 and stable.  3.  Chemotherapy-induced diarrhea: - Continue Imodium as needed which is helping.   Orders placed this encounter:  No orders of the defined types were placed in this encounter.    Fernando Jack, MD [facility] [phone]

## 2022-01-26 NOTE — Patient Instructions (Signed)
[facility] Discharge Instructions   You were seen and examined today by Dr. Delton Coombes.  He reviewed the results of your lab work which are stable/normal.  We will proceed with your treatment today.  Return as scheduled.    Thank you for choosing [facility] at [facility] to provide your oncology and hematology care.  To afford each patient quality time with our provider, please arrive at least 15 minutes before your scheduled appointment time.   If you have a lab appointment with the [facility] please come in thru the Main Entrance and check in at the main information desk.  You need to re-schedule your appointment should you arrive 10 or more minutes late.  We strive to give you quality time with our providers, and arriving late affects you and other patients whose appointments are after yours.  Also, if you no show three or more times for appointments you may be dismissed from the clinic at the providers discretion.     Again, thank you for choosing [facility].  Our hope is that these requests will decrease the amount of time that you wait before being seen by our physicians.       _____________________________________________________________  Should you have questions after your visit to [facility], please contact our office at [phone] and follow the prompts.  Our office hours are 8:00 a.m. and 4:30 p.m. Monday - Friday.  Please note that voicemails left after 4:00 p.m. may not be returned until the following business day.  We are closed weekends and major holidays.  You do have access to a nurse 24-7, just call the main number to the clinic [phone] and do not press any options, hold on the line and a nurse will answer the phone.    For prescription refill requests, have your pharmacy contact our office and allow 72 hours.    Due to Covid, you will need to wear a mask upon entering the  hospital. If you do not have a mask, a mask will be given to you at the Main Entrance upon arrival. For doctor visits, patients may have 1 support person age 67 or older with them. For treatment visits, patients can not have anyone with them due to social distancing guidelines and our immunocompromised population.

## 2022-01-28 ENCOUNTER — Inpatient Hospital Stay: Payer: Medicare Other

## 2022-01-28 VITALS — BP 133/82 | HR 70 | Temp 97.8°F | Resp 18

## 2022-01-28 DIAGNOSIS — Z95828 Presence of other vascular implants and grafts: Secondary | ICD-10-CM

## 2022-01-28 DIAGNOSIS — Z452 Encounter for adjustment and management of vascular access device: Secondary | ICD-10-CM | POA: Diagnosis not present

## 2022-01-28 DIAGNOSIS — Z5111 Encounter for antineoplastic chemotherapy: Secondary | ICD-10-CM | POA: Diagnosis not present

## 2022-01-28 DIAGNOSIS — C185 Malignant neoplasm of splenic flexure: Secondary | ICD-10-CM | POA: Diagnosis not present

## 2022-01-28 DIAGNOSIS — C183 Malignant neoplasm of hepatic flexure: Secondary | ICD-10-CM

## 2022-01-28 DIAGNOSIS — D631 Anemia in chronic kidney disease: Secondary | ICD-10-CM | POA: Diagnosis not present

## 2022-01-28 DIAGNOSIS — N189 Chronic kidney disease, unspecified: Secondary | ICD-10-CM | POA: Diagnosis not present

## 2022-01-28 LAB — CEA: CEA: 6.1 ng/mL — ABNORMAL HIGH (ref 0.0–4.7)

## 2022-01-28 MED ORDER — HEPARIN SOD (PORK) LOCK FLUSH 100 UNIT/ML IV SOLN
500.0000 [IU] | Freq: Once | INTRAVENOUS | Status: AC | PRN
  Administered 2022-01-28: 500 [IU]

## 2022-01-28 MED ORDER — SODIUM CHLORIDE 0.9% FLUSH
10.0000 mL | INTRAVENOUS | Status: DC | PRN
  Administered 2022-01-28: 10 mL

## 2022-01-28 NOTE — Patient Instructions (Signed)
[facility]  Discharge Instructions: Thank you for choosing [facility] to provide your oncology and hematology care.  If you have a lab appointment with the Cancer Center, please come in thru the Main Entrance and check in at the main information desk.  Wear comfortable clothing and clothing appropriate for easy access to any Portacath or PICC line.   We strive to give you quality time with your provider. You may need to reschedule your appointment if you arrive late (15 or more minutes).  Arriving late affects you and other patients whose appointments are after yours.  Also, if you miss three or more appointments without notifying the office, you may be dismissed from the clinic at the provider's discretion.      For prescription refill requests, have your pharmacy contact our office and allow 72 hours for refills to be completed.     To help prevent nausea and vomiting after your treatment, we encourage you to take your nausea medication as directed.  BELOW ARE SYMPTOMS THAT SHOULD BE REPORTED IMMEDIATELY: *FEVER GREATER THAN 100.4 F (38 C) OR HIGHER *CHILLS OR SWEATING *NAUSEA AND VOMITING THAT IS NOT CONTROLLED WITH YOUR NAUSEA MEDICATION *UNUSUAL SHORTNESS OF BREATH *UNUSUAL BRUISING OR BLEEDING *URINARY PROBLEMS (pain or burning when urinating, or frequent urination) *BOWEL PROBLEMS (unusual diarrhea, constipation, pain near the anus) TENDERNESS IN MOUTH AND THROAT WITH OR WITHOUT PRESENCE OF ULCERS (sore throat, sores in mouth, or a toothache) UNUSUAL RASH, SWELLING OR PAIN  UNUSUAL VAGINAL DISCHARGE OR ITCHING   Items with * indicate a potential emergency and should be followed up as soon as possible or go to the Emergency Department if any problems should occur.  Please show the CHEMOTHERAPY ALERT CARD or IMMUNOTHERAPY ALERT CARD at check-in to the Emergency Department and triage nurse.  Should you have questions after your visit or need to  cancel or reschedule your appointment, please contact [facility] [phone]  and follow the prompts.  Office hours are 8:00 a.m. to 4:30 p.m. Monday - Friday. Please note that voicemails left after 4:00 p.m. may not be returned until the following business day.  We are closed weekends and major holidays. You have access to a nurse at all times for urgent questions. Please call the main number to the clinic [phone] and follow the prompts.  For any non-urgent questions, you may also contact your provider using MyChart. We now offer e-Visits for anyone 18 and older to request care online for non-urgent symptoms. For details visit [email or web address].   Also download the MyChart app! Go to the app store, search "MyChart", open the app, select [facility], and log in with your MyChart username and password.  Masks are optional in the cancer centers. If you would like for your care team to wear a mask while they are taking care of you, please let them know. You may have one support person who is at least 71 years old accompany you for your appointments.  

## 2022-01-28 NOTE — Progress Notes (Signed)
Patients port flushed without difficulty.  Good blood return noted with no bruising or swelling noted at site.  Home infusion 5FU pump disconnected.  Band aid applied.  VSS with discharge and left in satisfactory condition with no s/s of distress noted.   °

## 2022-02-04 DIAGNOSIS — C183 Malignant neoplasm of hepatic flexure: Secondary | ICD-10-CM | POA: Diagnosis not present

## 2022-02-07 ENCOUNTER — Ambulatory Visit (INDEPENDENT_AMBULATORY_CARE_PROVIDER_SITE_OTHER): Payer: Medicare Other

## 2022-02-07 DIAGNOSIS — I442 Atrioventricular block, complete: Secondary | ICD-10-CM

## 2022-02-08 LAB — CUP PACEART REMOTE DEVICE CHECK
Battery Remaining Longevity: 114 mo
Battery Remaining Percentage: 94 %
Battery Voltage: 3.01 V
Brady Statistic AP VP Percent: 9.5 %
Brady Statistic AP VS Percent: 1 %
Brady Statistic AS VP Percent: 89 %
Brady Statistic AS VS Percent: 1 %
Brady Statistic RA Percent Paced: 8.7 %
Brady Statistic RV Percent Paced: 99 %
Date Time Interrogation Session: 20231218020013
Implantable Lead Connection Status: 753985
Implantable Lead Connection Status: 753985
Implantable Lead Implant Date: 20221219
Implantable Lead Implant Date: 20221219
Implantable Lead Location: 753859
Implantable Lead Location: 753860
Implantable Pulse Generator Implant Date: 20221219
Lead Channel Impedance Value: 390 Ohm
Lead Channel Impedance Value: 510 Ohm
Lead Channel Pacing Threshold Amplitude: 0.5 V
Lead Channel Pacing Threshold Amplitude: 0.875 V
Lead Channel Pacing Threshold Pulse Width: 0.5 ms
Lead Channel Pacing Threshold Pulse Width: 0.5 ms
Lead Channel Sensing Intrinsic Amplitude: 10.2 mV
Lead Channel Sensing Intrinsic Amplitude: 2.7 mV
Lead Channel Setting Pacing Amplitude: 1.125
Lead Channel Setting Pacing Amplitude: 1.5 V
Lead Channel Setting Pacing Pulse Width: 0.5 ms
Lead Channel Setting Sensing Sensitivity: 2 mV
Pulse Gen Model: 2272
Pulse Gen Serial Number: 3985814

## 2022-02-09 ENCOUNTER — Inpatient Hospital Stay: Payer: Medicare Other

## 2022-02-09 ENCOUNTER — Ambulatory Visit: Payer: Medicare Other | Admitting: Hematology

## 2022-02-09 VITALS — BP 148/83 | HR 78 | Temp 99.0°F | Resp 18

## 2022-02-09 DIAGNOSIS — Z95828 Presence of other vascular implants and grafts: Secondary | ICD-10-CM

## 2022-02-09 DIAGNOSIS — C183 Malignant neoplasm of hepatic flexure: Secondary | ICD-10-CM

## 2022-02-09 DIAGNOSIS — N189 Chronic kidney disease, unspecified: Secondary | ICD-10-CM | POA: Diagnosis not present

## 2022-02-09 DIAGNOSIS — C185 Malignant neoplasm of splenic flexure: Secondary | ICD-10-CM | POA: Diagnosis not present

## 2022-02-09 DIAGNOSIS — Z452 Encounter for adjustment and management of vascular access device: Secondary | ICD-10-CM | POA: Diagnosis not present

## 2022-02-09 DIAGNOSIS — R7989 Other specified abnormal findings of blood chemistry: Secondary | ICD-10-CM

## 2022-02-09 DIAGNOSIS — D631 Anemia in chronic kidney disease: Secondary | ICD-10-CM | POA: Diagnosis not present

## 2022-02-09 DIAGNOSIS — Z5111 Encounter for antineoplastic chemotherapy: Secondary | ICD-10-CM | POA: Diagnosis not present

## 2022-02-09 LAB — CBC WITH DIFFERENTIAL/PLATELET
Abs Immature Granulocytes: 0.01 10*3/uL (ref 0.00–0.07)
Basophils Absolute: 0 10*3/uL (ref 0.0–0.1)
Basophils Relative: 1 %
Eosinophils Absolute: 0.1 10*3/uL (ref 0.0–0.5)
Eosinophils Relative: 2 %
HCT: 37.9 % — ABNORMAL LOW (ref 39.0–52.0)
Hemoglobin: 12.7 g/dL — ABNORMAL LOW (ref 13.0–17.0)
Immature Granulocytes: 0 %
Lymphocytes Relative: 50 %
Lymphs Abs: 2.3 10*3/uL (ref 0.7–4.0)
MCH: 33.2 pg (ref 26.0–34.0)
MCHC: 33.5 g/dL (ref 30.0–36.0)
MCV: 99.2 fL (ref 80.0–100.0)
Monocytes Absolute: 0.7 10*3/uL (ref 0.1–1.0)
Monocytes Relative: 16 %
Neutro Abs: 1.4 10*3/uL — ABNORMAL LOW (ref 1.7–7.7)
Neutrophils Relative %: 31 %
Platelets: 121 10*3/uL — ABNORMAL LOW (ref 150–400)
RBC: 3.82 MIL/uL — ABNORMAL LOW (ref 4.22–5.81)
RDW: 15.4 % (ref 11.5–15.5)
WBC: 4.5 10*3/uL (ref 4.0–10.5)
nRBC: 0 % (ref 0.0–0.2)

## 2022-02-09 LAB — MAGNESIUM: Magnesium: 2 mg/dL (ref 1.7–2.4)

## 2022-02-09 LAB — COMPREHENSIVE METABOLIC PANEL
ALT: 13 U/L (ref 0–44)
AST: 24 U/L (ref 15–41)
Albumin: 3.5 g/dL (ref 3.5–5.0)
Alkaline Phosphatase: 117 U/L (ref 38–126)
Anion gap: 7 (ref 5–15)
BUN: 28 mg/dL — ABNORMAL HIGH (ref 8–23)
CO2: 19 mmol/L — ABNORMAL LOW (ref 22–32)
Calcium: 8.8 mg/dL — ABNORMAL LOW (ref 8.9–10.3)
Chloride: 111 mmol/L (ref 98–111)
Creatinine, Ser: 1.88 mg/dL — ABNORMAL HIGH (ref 0.61–1.24)
GFR, Estimated: 38 mL/min — ABNORMAL LOW (ref >60)
Glucose, Bld: 105 mg/dL — ABNORMAL HIGH (ref 70–99)
Potassium: 4.9 mmol/L (ref 3.5–5.1)
Sodium: 137 mmol/L (ref 135–145)
Total Bilirubin: 0.8 mg/dL (ref 0.3–1.2)
Total Protein: 7.6 g/dL (ref 6.5–8.1)

## 2022-02-09 MED ORDER — OXALIPLATIN CHEMO INJECTION 100 MG/20ML
68.0000 mg/m2 | Freq: Once | INTRAVENOUS | Status: AC
  Administered 2022-02-09: 135 mg via INTRAVENOUS
  Filled 2022-02-09: qty 20

## 2022-02-09 MED ORDER — SODIUM CHLORIDE 0.9 % IV SOLN: Freq: Once | INTRAVENOUS | Status: AC

## 2022-02-09 MED ORDER — LEUCOVORIN CALCIUM INJECTION 350 MG
320.0000 mg/m2 | Freq: Once | INTRAVENOUS | Status: AC
  Administered 2022-02-09: 628 mg via INTRAVENOUS
  Filled 2022-02-09: qty 31.4

## 2022-02-09 MED ORDER — SODIUM CHLORIDE 0.9 % IV SOLN
1920.0000 mg/m2 | INTRAVENOUS | Status: DC
  Administered 2022-02-09: 3750 mg via INTRAVENOUS
  Filled 2022-02-09: qty 75

## 2022-02-09 MED ORDER — SODIUM CHLORIDE 0.9 % IV SOLN
10.0000 mg | Freq: Once | INTRAVENOUS | Status: AC
  Administered 2022-02-09: 10 mg via INTRAVENOUS
  Filled 2022-02-09: qty 10

## 2022-02-09 MED ORDER — PALONOSETRON HCL INJECTION 0.25 MG/5ML
0.2500 mg | Freq: Once | INTRAVENOUS | Status: AC
  Administered 2022-02-09: 0.25 mg via INTRAVENOUS
  Filled 2022-02-09: qty 5

## 2022-02-09 MED ORDER — DEXTROSE 5 % IV SOLN: Freq: Once | INTRAVENOUS | Status: AC

## 2022-02-09 MED ORDER — FLUOROURACIL CHEMO INJECTION 2.5 GM/50ML
320.0000 mg/m2 | Freq: Once | INTRAVENOUS | Status: AC
  Administered 2022-02-09: 650 mg via INTRAVENOUS
  Filled 2022-02-09: qty 13

## 2022-02-09 MED ORDER — SODIUM CHLORIDE 0.9% FLUSH
10.0000 mL | Freq: Once | INTRAVENOUS | Status: AC
  Administered 2022-02-09: 10 mL via INTRAVENOUS

## 2022-02-09 NOTE — Progress Notes (Signed)
Patient presents today for chemotherapy treatment.  Patient is in satisfactory condition with no new complaints voiced.  Vital signs are stable.  Labs reviewed.  ANC is 1.4 and creatinine is 1.88.  Dr. Delton Coombes made aware.  We will give NS 500 mL over one hour today per Dr. Delton Coombes.  All other labs are within treatment parameters.  We will proceed with treatment per MD orders.   Patient tolerated treatment well with no complaints voiced.  Home infusion 5FU pump connected with no issues.  Patient left ambulatory with wife in stable condition.  Vital signs stable at discharge.  Follow up as scheduled.

## 2022-02-09 NOTE — Progress Notes (Signed)
Patients port flushed without difficulty.  Good blood return noted with no bruising or swelling noted at site.  Stable during access and blood draw.  Patient to remain accessed for treatment. 

## 2022-02-09 NOTE — Patient Instructions (Addendum)
[facility]  Discharge Instructions: Thank you for choosing [facility] to provide your oncology and hematology care.  If you have a lab appointment with the [facility], please come in thru the Main Entrance and check in at the main information desk.  Wear comfortable clothing and clothing appropriate for easy access to any Portacath or PICC line.   We strive to give you quality time with your provider. You may need to reschedule your appointment if you arrive late (15 or more minutes).  Arriving late affects you and other patients whose appointments are after yours.  Also, if you miss three or more appointments without notifying the office, you may be dismissed from the clinic at the provider's discretion.      For prescription refill requests, have your pharmacy contact our office and allow 72 hours for refills to be completed.    Today you received the following chemotherapy and/or immunotherapy agents Oxaliplatin/Leucovorin/Fluorouracil.     Oxaliplatin Injection What is this medication? OXALIPLATIN (ox AL i PLA tin) treats some types of cancer. It works by slowing down the growth of cancer cells. This medicine may be used for other purposes; ask your health care provider or pharmacist if you have questions. COMMON BRAND NAME(S): Eloxatin What should I tell my care team before I take this medication? They need to know if you have any of these conditions: Heart disease History of irregular heartbeat or rhythm Liver disease Low blood cell levels (white cells, red cells, and platelets) Lung or breathing disease, such as asthma Take medications that treat or prevent blood clots Tingling of the fingers, toes, or other nerve disorder An unusual or allergic reaction to oxaliplatin, other medications, foods, dyes, or preservatives If you or your partner are pregnant or trying to get pregnant Breast-feeding How should I use this medication? This  medication is injected into a vein. It is given by your care team in a hospital or clinic setting. Talk to your care team about the use of this medication in children. Special care may be needed. Overdosage: If you think you have taken too much of this medicine contact a poison control center or emergency room at once. NOTE: This medicine is only for you. Do not share this medicine with others. What if I miss a dose? Keep appointments for follow-up doses. It is important not to miss a dose. Call your care team if you are unable to keep an appointment. What may interact with this medication? Do not take this medication with any of the following: Cisapride Dronedarone Pimozide Thioridazine This medication may also interact with the following: Aspirin and aspirin-like medications Certain medications that treat or prevent blood clots, such as warfarin, apixaban, dabigatran, and rivaroxaban Cisplatin Cyclosporine Diuretics Medications for infection, such as acyclovir, adefovir, amphotericin B, bacitracin, cidofovir, foscarnet, ganciclovir, gentamicin, pentamidine, vancomycin NSAIDs, medications for pain and inflammation, such as ibuprofen or naproxen Other medications that cause heart rhythm changes Pamidronate Zoledronic acid This list may not describe all possible interactions. Give your health care provider a list of all the medicines, herbs, non-prescription drugs, or dietary supplements you use. Also tell them if you smoke, drink alcohol, or use illegal drugs. Some items may interact with your medicine. What should I watch for while using this medication? Your condition will be monitored carefully while you are receiving this medication. You may need blood work while taking this medication. This medication may make you feel generally unwell. This is not uncommon  as chemotherapy can affect healthy cells as well as cancer cells. Report any side effects. Continue your course of treatment even  though you feel ill unless your care team tells you to stop. This medication may increase your risk of getting an infection. Call your care team for advice if you get a fever, chills, sore throat, or other symptoms of a cold or flu. Do not treat yourself. Try to avoid being around people who are sick. Avoid taking medications that contain aspirin, acetaminophen, ibuprofen, naproxen, or ketoprofen unless instructed by your care team. These medications may hide a fever. Be careful brushing or flossing your teeth or using a toothpick because you may get an infection or bleed more easily. If you have any dental work done, tell your dentist you are receiving this medication. This medication can make you more sensitive to cold. Do not drink cold drinks or use ice. Cover exposed skin before coming in contact with cold temperatures or cold objects. When out in cold weather wear warm clothing and cover your mouth and nose to warm the air that goes into your lungs. Tell your care team if you get sensitive to the cold. Talk to your care team if you or your partner are pregnant or think either of you might be pregnant. This medication can cause serious birth defects if taken during pregnancy and for 9 months after the last dose. A negative pregnancy test is required before starting this medication. A reliable form of contraception is recommended while taking this medication and for 9 months after the last dose. Talk to your care team about effective forms of contraception. Do not father a child while taking this medication and for 6 months after the last dose. Use a condom while having sex during this time period. Do not breastfeed while taking this medication and for 3 months after the last dose. This medication may cause infertility. Talk to your care team if you are concerned about your fertility. What side effects may I notice from receiving this medication? Side effects that you should report to your care team as  soon as possible: Allergic reactions--skin rash, itching, hives, swelling of the face, lips, tongue, or throat Bleeding--bloody or black, tar-like stools, vomiting blood or Darshana Curnutt material that looks like coffee grounds, red or dark Philena Obey urine, small red or purple spots on skin, unusual bruising or bleeding Dry cough, shortness of breath or trouble breathing Heart rhythm changes--fast or irregular heartbeat, dizziness, feeling faint or lightheaded, chest pain, trouble breathing Infection--fever, chills, cough, sore throat, wounds that don't heal, pain or trouble when passing urine, general feeling of discomfort or being unwell Liver injury--right upper belly pain, loss of appetite, nausea, light-colored stool, dark yellow or Dinora Hemm urine, yellowing skin or eyes, unusual weakness or fatigue Low red blood cell level--unusual weakness or fatigue, dizziness, headache, trouble breathing Muscle injury--unusual weakness or fatigue, muscle pain, dark yellow or Costas Sena urine, decrease in amount of urine Pain, tingling, or numbness in the hands or feet Sudden and severe headache, confusion, change in vision, seizures, which may be signs of posterior reversible encephalopathy syndrome (PRES) Unusual bruising or bleeding Side effects that usually do not require medical attention (report to your care team if they continue or are bothersome): Diarrhea Nausea Pain, redness, or swelling with sores inside the mouth or throat Unusual weakness or fatigue Vomiting This list may not describe all possible side effects. Call your doctor for medical advice about side effects. You may report side effects to  FDA at 1-800-FDA-1088. Where should I keep my medication? This medication is given in a hospital or clinic. It will not be stored at home. NOTE: This sheet is a summary. It may not cover all possible information. If you have questions about this medicine, talk to your doctor, pharmacist, or health care provider.  2023  Elsevier/Gold Standard (2007-03-31 00:00:00)    Leucovorin Injection What is this medication? LEUCOVORIN (loo koe VOR in) prevents side effects from certain medications, such as methotrexate. It works by increasing folate levels. This helps protect healthy cells in your body. It may also be used to treat anemia caused by low levels of folate. It can also be used with fluorouracil, a type of chemotherapy, to treat colorectal cancer. It works by increasing the effects of fluorouracil in the body. This medicine may be used for other purposes; ask your health care provider or pharmacist if you have questions. What should I tell my care team before I take this medication? They need to know if you have any of these conditions: Anemia from low levels of vitamin B12 in the blood An unusual or allergic reaction to leucovorin, folic acid, other medications, foods, dyes, or preservatives Pregnant or trying to get pregnant Breastfeeding How should I use this medication? This medication is injected into a vein or a muscle. It is given by your care team in a hospital or clinic setting. Talk to your care team about the use of this medication in children. Special care may be needed. Overdosage: If you think you have taken too much of this medicine contact a poison control center or emergency room at once. NOTE: This medicine is only for you. Do not share this medicine with others. What if I miss a dose? Keep appointments for follow-up doses. It is important not to miss your dose. Call your care team if you are unable to keep an appointment. What may interact with this medication? Capecitabine Fluorouracil Phenobarbital Phenytoin Primidone Trimethoprim;sulfamethoxazole This list may not describe all possible interactions. Give your health care provider a list of all the medicines, herbs, non-prescription drugs, or dietary supplements you use. Also tell them if you smoke, drink alcohol, or use illegal  drugs. Some items may interact with your medicine. What should I watch for while using this medication? Your condition will be monitored carefully while you are receiving this medication. This medication may increase the side effects of 5-fluorouracil. Tell your care team if you have diarrhea or mouth sores that do not get better or that get worse. What side effects may I notice from receiving this medication? Side effects that you should report to your care team as soon as possible: Allergic reactions--skin rash, itching, hives, swelling of the face, lips, tongue, or throat This list may not describe all possible side effects. Call your doctor for medical advice about side effects. You may report side effects to FDA at 1-800-FDA-1088. Where should I keep my medication? This medication is given in a hospital or clinic. It will not be stored at home. NOTE: This sheet is a summary. It may not cover all possible information. If you have questions about this medicine, talk to your doctor, pharmacist, or health care provider.  2023 Elsevier/Gold Standard (2021-07-13 00:00:00)    Fluorouracil Injection What is this medication? FLUOROURACIL (flure oh YOOR a sil) treats some types of cancer. It works by slowing down the growth of cancer cells. This medicine may be used for other purposes; ask your health care provider or  pharmacist if you have questions. COMMON BRAND NAME(S): Adrucil What should I tell my care team before I take this medication? They need to know if you have any of these conditions: Blood disorders Dihydropyrimidine dehydrogenase (DPD) deficiency Infection, such as chickenpox, cold sores, herpes Kidney disease Liver disease Poor nutrition Recent or ongoing radiation therapy An unusual or allergic reaction to fluorouracil, other medications, foods, dyes, or preservatives If you or your partner are pregnant or trying to get pregnant Breast-feeding How should I use this  medication? This medication is injected into a vein. It is administered by your care team in a hospital or clinic setting. Talk to your care team about the use of this medication in children. Special care may be needed. Overdosage: If you think you have taken too much of this medicine contact a poison control center or emergency room at once. NOTE: This medicine is only for you. Do not share this medicine with others. What if I miss a dose? Keep appointments for follow-up doses. It is important not to miss your dose. Call your care team if you are unable to keep an appointment. What may interact with this medication? Do not take this medication with any of the following: Live virus vaccines This medication may also interact with the following: Medications that treat or prevent blood clots, such as warfarin, enoxaparin, dalteparin This list may not describe all possible interactions. Give your health care provider a list of all the medicines, herbs, non-prescription drugs, or dietary supplements you use. Also tell them if you smoke, drink alcohol, or use illegal drugs. Some items may interact with your medicine. What should I watch for while using this medication? Your condition will be monitored carefully while you are receiving this medication. This medication may make you feel generally unwell. This is not uncommon as chemotherapy can affect healthy cells as well as cancer cells. Report any side effects. Continue your course of treatment even though you feel ill unless your care team tells you to stop. In some cases, you may be given additional medications to help with side effects. Follow all directions for their use. This medication may increase your risk of getting an infection. Call your care team for advice if you get a fever, chills, sore throat, or other symptoms of a cold or flu. Do not treat yourself. Try to avoid being around people who are sick. This medication may increase your risk  to bruise or bleed. Call your care team if you notice any unusual bleeding. Be careful brushing or flossing your teeth or using a toothpick because you may get an infection or bleed more easily. If you have any dental work done, tell your dentist you are receiving this medication. Avoid taking medications that contain aspirin, acetaminophen, ibuprofen, naproxen, or ketoprofen unless instructed by your care team. These medications may hide a fever. Do not treat diarrhea with over the counter products. Contact your care team if you have diarrhea that lasts more than 2 days or if it is severe and watery. This medication can make you more sensitive to the sun. Keep out of the sun. If you cannot avoid being in the sun, wear protective clothing and sunscreen. Do not use sun lamps, tanning beds, or tanning booths. Talk to your care team if you or your partner wish to become pregnant or think you might be pregnant. This medication can cause serious birth defects if taken during pregnancy and for 3 months after the last dose. A reliable form  of contraception is recommended while taking this medication and for 3 months after the last dose. Talk to your care team about effective forms of contraception. Do not father a child while taking this medication and for 3 months after the last dose. Use a condom while having sex during this time period. Do not breastfeed while taking this medication. This medication may cause infertility. Talk to your care team if you are concerned about your fertility. What side effects may I notice from receiving this medication? Side effects that you should report to your care team as soon as possible: Allergic reactions--skin rash, itching, hives, swelling of the face, lips, tongue, or throat Heart attack--pain or tightness in the chest, shoulders, arms, or jaw, nausea, shortness of breath, cold or clammy skin, feeling faint or lightheaded Heart failure--shortness of breath, swelling of  the ankles, feet, or hands, sudden weight gain, unusual weakness or fatigue Heart rhythm changes--fast or irregular heartbeat, dizziness, feeling faint or lightheaded, chest pain, trouble breathing High ammonia level--unusual weakness or fatigue, confusion, loss of appetite, nausea, vomiting, seizures Infection--fever, chills, cough, sore throat, wounds that don't heal, pain or trouble when passing urine, general feeling of discomfort or being unwell Low red blood cell level--unusual weakness or fatigue, dizziness, headache, trouble breathing Pain, tingling, or numbness in the hands or feet, muscle weakness, change in vision, confusion or trouble speaking, loss of balance or coordination, trouble walking, seizures Redness, swelling, and blistering of the skin over hands and feet Severe or prolonged diarrhea Unusual bruising or bleeding Side effects that usually do not require medical attention (report to your care team if they continue or are bothersome): Dry skin Headache Increased tears Nausea Pain, redness, or swelling with sores inside the mouth or throat Sensitivity to light Vomiting This list may not describe all possible side effects. Call your doctor for medical advice about side effects. You may report side effects to FDA at 1-800-FDA-1088. Where should I keep my medication? This medication is given in a hospital or clinic. It will not be stored at home. NOTE: This sheet is a summary. It may not cover all possible information. If you have questions about this medicine, talk to your doctor, pharmacist, or health care provider.  2023 Elsevier/Gold Standard (2021-06-08 00:00:00)     To help prevent nausea and vomiting after your treatment, we encourage you to take your nausea medication as directed.  BELOW ARE SYMPTOMS THAT SHOULD BE REPORTED IMMEDIATELY: *FEVER GREATER THAN 100.4 F (38 C) OR HIGHER *CHILLS OR SWEATING *NAUSEA AND VOMITING THAT IS NOT CONTROLLED WITH YOUR NAUSEA  MEDICATION *UNUSUAL SHORTNESS OF BREATH *UNUSUAL BRUISING OR BLEEDING *URINARY PROBLEMS (pain or burning when urinating, or frequent urination) *BOWEL PROBLEMS (unusual diarrhea, constipation, pain near the anus) TENDERNESS IN MOUTH AND THROAT WITH OR WITHOUT PRESENCE OF ULCERS (sore throat, sores in mouth, or a toothache) UNUSUAL RASH, SWELLING OR PAIN  UNUSUAL VAGINAL DISCHARGE OR ITCHING   Items with * indicate a potential emergency and should be followed up as soon as possible or go to the Emergency Department if any problems should occur.  Please show the CHEMOTHERAPY ALERT CARD or IMMUNOTHERAPY ALERT CARD at check-in to the Emergency Department and triage nurse.  Should you have questions after your visit or need to cancel or reschedule your appointment, please contact [facility] [phone]  and follow the prompts.  Office hours are 8:00 a.m. to 4:30 p.m. Monday - Friday. Please note that voicemails left after 4:00 p.m. may  not be returned until the following business day.  We are closed weekends and major holidays. You have access to a nurse at all times for urgent questions. Please call the main number to the clinic [phone] and follow the prompts.  For any non-urgent questions, you may also contact your provider using MyChart. We now offer e-Visits for anyone 32 and older to request care online for non-urgent symptoms. For details visit [email or web address].   Also download the MyChart app! Go to the app store, search "MyChart", open the app, select [facility], and log in with your MyChart username and password.  Masks are optional in the cancer centers. If you would like for your care team to wear a mask while they are taking care of you, please let them know. You may have one support person who is at least 71 years old accompany you for your appointments.

## 2022-02-11 ENCOUNTER — Inpatient Hospital Stay: Payer: Medicare Other

## 2022-02-11 DIAGNOSIS — C183 Malignant neoplasm of hepatic flexure: Secondary | ICD-10-CM

## 2022-02-11 DIAGNOSIS — Z452 Encounter for adjustment and management of vascular access device: Secondary | ICD-10-CM | POA: Diagnosis not present

## 2022-02-11 DIAGNOSIS — Z5111 Encounter for antineoplastic chemotherapy: Secondary | ICD-10-CM | POA: Diagnosis not present

## 2022-02-11 DIAGNOSIS — D631 Anemia in chronic kidney disease: Secondary | ICD-10-CM | POA: Diagnosis not present

## 2022-02-11 DIAGNOSIS — C185 Malignant neoplasm of splenic flexure: Secondary | ICD-10-CM | POA: Diagnosis not present

## 2022-02-11 DIAGNOSIS — N189 Chronic kidney disease, unspecified: Secondary | ICD-10-CM | POA: Diagnosis not present

## 2022-02-11 DIAGNOSIS — Z95828 Presence of other vascular implants and grafts: Secondary | ICD-10-CM

## 2022-02-11 MED ORDER — HEPARIN SOD (PORK) LOCK FLUSH 100 UNIT/ML IV SOLN
500.0000 [IU] | Freq: Once | INTRAVENOUS | Status: AC
  Administered 2022-02-11: 500 [IU] via INTRAVENOUS

## 2022-02-11 MED ORDER — SODIUM CHLORIDE 0.9% FLUSH
10.0000 mL | Freq: Once | INTRAVENOUS | Status: AC
  Administered 2022-02-11: 10 mL via INTRAVENOUS

## 2022-02-11 NOTE — Patient Instructions (Signed)
[facility]  Discharge Instructions: Thank you for choosing [facility] to provide your oncology and hematology care.  If you have a lab appointment with the [facility], please come in thru the Main Entrance and check in at the main information desk.  Wear comfortable clothing and clothing appropriate for easy access to any Portacath or PICC line.   We strive to give you quality time with your provider. You may need to reschedule your appointment if you arrive late (15 or more minutes).  Arriving late affects you and other patients whose appointments are after yours.  Also, if you miss three or more appointments without notifying the office, you may be dismissed from the clinic at the provider's discretion.      For prescription refill requests, have your pharmacy contact our office and allow 72 hours for refills to be completed.    Today you received the following chemotherapy and/or immunotherapy agents Pump stop      To help prevent nausea and vomiting after your treatment, we encourage you to take your nausea medication as directed.  BELOW ARE SYMPTOMS THAT SHOULD BE REPORTED IMMEDIATELY: *FEVER GREATER THAN 100.4 F (38 C) OR HIGHER *CHILLS OR SWEATING *NAUSEA AND VOMITING THAT IS NOT CONTROLLED WITH YOUR NAUSEA MEDICATION *UNUSUAL SHORTNESS OF BREATH *UNUSUAL BRUISING OR BLEEDING *URINARY PROBLEMS (pain or burning when urinating, or frequent urination) *BOWEL PROBLEMS (unusual diarrhea, constipation, pain near the anus) TENDERNESS IN MOUTH AND THROAT WITH OR WITHOUT PRESENCE OF ULCERS (sore throat, sores in mouth, or a toothache) UNUSUAL RASH, SWELLING OR PAIN  UNUSUAL VAGINAL DISCHARGE OR ITCHING   Items with * indicate a potential emergency and should be followed up as soon as possible or go to the Emergency Department if any problems should occur.  Please show the CHEMOTHERAPY ALERT CARD or IMMUNOTHERAPY ALERT CARD at check-in to the  Emergency Department and triage nurse.  Should you have questions after your visit or need to cancel or reschedule your appointment, please contact [facility] [phone]  and follow the prompts.  Office hours are 8:00 a.m. to 4:30 p.m. Monday - Friday. Please note that voicemails left after 4:00 p.m. may not be returned until the following business day.  We are closed weekends and major holidays. You have access to a nurse at all times for urgent questions. Please call the main number to the clinic [phone] and follow the prompts.  For any non-urgent questions, you may also contact your provider using MyChart. We now offer e-Visits for anyone 37 and older to request care online for non-urgent symptoms. For details visit [email or web address].   Also download the MyChart app! Go to the app store, search "MyChart", open the app, select [facility], and log in with your MyChart username and password.  Masks are optional in the cancer centers. If you would like for your care team to wear a mask while they are taking care of you, please let them know. You may have one support person who is at least 71 years old accompany you for your appointments.

## 2022-02-11 NOTE — Progress Notes (Signed)
Patient presents today for 5FU pump stop and disconnection after 46 hour continous infusion.   5FU pump deaccessed.  Patients port flushed without difficulty.  Good blood return noted with no bruising or swelling noted at site.  Needle removed intact.  Band aid applied.  VSS with discharge and left in satisfactory condition via wheelchair with no s/s of distress noted.    

## 2022-02-23 ENCOUNTER — Inpatient Hospital Stay (HOSPITAL_BASED_OUTPATIENT_CLINIC_OR_DEPARTMENT_OTHER): Payer: Medicare Other | Admitting: Hematology

## 2022-02-23 ENCOUNTER — Inpatient Hospital Stay: Payer: Medicare Other

## 2022-02-23 ENCOUNTER — Inpatient Hospital Stay: Payer: Medicare Other | Attending: Hematology

## 2022-02-23 VITALS — BP 143/80 | HR 71 | Temp 97.3°F | Resp 18

## 2022-02-23 DIAGNOSIS — Z95828 Presence of other vascular implants and grafts: Secondary | ICD-10-CM

## 2022-02-23 DIAGNOSIS — Z5111 Encounter for antineoplastic chemotherapy: Secondary | ICD-10-CM | POA: Insufficient documentation

## 2022-02-23 DIAGNOSIS — C183 Malignant neoplasm of hepatic flexure: Secondary | ICD-10-CM

## 2022-02-23 DIAGNOSIS — N189 Chronic kidney disease, unspecified: Secondary | ICD-10-CM | POA: Diagnosis not present

## 2022-02-23 DIAGNOSIS — D631 Anemia in chronic kidney disease: Secondary | ICD-10-CM | POA: Insufficient documentation

## 2022-02-23 DIAGNOSIS — Z452 Encounter for adjustment and management of vascular access device: Secondary | ICD-10-CM | POA: Insufficient documentation

## 2022-02-23 DIAGNOSIS — R197 Diarrhea, unspecified: Secondary | ICD-10-CM | POA: Insufficient documentation

## 2022-02-23 LAB — CBC WITH DIFFERENTIAL/PLATELET
Abs Immature Granulocytes: 0.02 10*3/uL (ref 0.00–0.07)
Basophils Absolute: 0 10*3/uL (ref 0.0–0.1)
Basophils Relative: 1 %
Eosinophils Absolute: 0.1 10*3/uL (ref 0.0–0.5)
Eosinophils Relative: 2 %
HCT: 34.5 % — ABNORMAL LOW (ref 39.0–52.0)
Hemoglobin: 11.4 g/dL — ABNORMAL LOW (ref 13.0–17.0)
Immature Granulocytes: 0 %
Lymphocytes Relative: 48 %
Lymphs Abs: 2.4 10*3/uL (ref 0.7–4.0)
MCH: 33.1 pg (ref 26.0–34.0)
MCHC: 33 g/dL (ref 30.0–36.0)
MCV: 100.3 fL — ABNORMAL HIGH (ref 80.0–100.0)
Monocytes Absolute: 0.9 10*3/uL (ref 0.1–1.0)
Monocytes Relative: 18 %
Neutro Abs: 1.5 10*3/uL — ABNORMAL LOW (ref 1.7–7.7)
Neutrophils Relative %: 31 %
Platelets: 138 10*3/uL — ABNORMAL LOW (ref 150–400)
RBC: 3.44 MIL/uL — ABNORMAL LOW (ref 4.22–5.81)
RDW: 15.7 % — ABNORMAL HIGH (ref 11.5–15.5)
WBC: 4.9 10*3/uL (ref 4.0–10.5)
nRBC: 0 % (ref 0.0–0.2)

## 2022-02-23 LAB — MAGNESIUM: Magnesium: 1.8 mg/dL (ref 1.7–2.4)

## 2022-02-23 LAB — COMPREHENSIVE METABOLIC PANEL
ALT: 10 U/L (ref 0–44)
AST: 19 U/L (ref 15–41)
Albumin: 3.5 g/dL (ref 3.5–5.0)
Alkaline Phosphatase: 125 U/L (ref 38–126)
Anion gap: 8 (ref 5–15)
BUN: 24 mg/dL — ABNORMAL HIGH (ref 8–23)
CO2: 19 mmol/L — ABNORMAL LOW (ref 22–32)
Calcium: 9 mg/dL (ref 8.9–10.3)
Chloride: 108 mmol/L (ref 98–111)
Creatinine, Ser: 1.81 mg/dL — ABNORMAL HIGH (ref 0.61–1.24)
GFR, Estimated: 39 mL/min — ABNORMAL LOW (ref >60)
Glucose, Bld: 93 mg/dL (ref 70–99)
Potassium: 4.7 mmol/L (ref 3.5–5.1)
Sodium: 135 mmol/L (ref 135–145)
Total Bilirubin: 0.5 mg/dL (ref 0.3–1.2)
Total Protein: 7.4 g/dL (ref 6.5–8.1)

## 2022-02-23 MED ORDER — OXALIPLATIN CHEMO INJECTION 100 MG/20ML
68.0000 mg/m2 | Freq: Once | INTRAVENOUS | Status: AC
  Administered 2022-02-23: 135 mg via INTRAVENOUS
  Filled 2022-02-23: qty 20

## 2022-02-23 MED ORDER — PALONOSETRON HCL INJECTION 0.25 MG/5ML
0.2500 mg | Freq: Once | INTRAVENOUS | Status: AC
  Administered 2022-02-23: 0.25 mg via INTRAVENOUS
  Filled 2022-02-23: qty 5

## 2022-02-23 MED ORDER — LEUCOVORIN CALCIUM INJECTION 350 MG
320.0000 mg/m2 | Freq: Once | INTRAVENOUS | Status: AC
  Administered 2022-02-23: 628 mg via INTRAVENOUS
  Filled 2022-02-23: qty 31.4

## 2022-02-23 MED ORDER — SODIUM CHLORIDE 0.9% FLUSH: 10.0000 mL | INTRAVENOUS | Status: DC | PRN

## 2022-02-23 MED ORDER — SODIUM CHLORIDE 0.9 % IV SOLN
10.0000 mg | Freq: Once | INTRAVENOUS | Status: AC
  Administered 2022-02-23: 10 mg via INTRAVENOUS
  Filled 2022-02-23: qty 10

## 2022-02-23 MED ORDER — SODIUM CHLORIDE 0.9 % IV SOLN: Freq: Once | INTRAVENOUS | Status: AC

## 2022-02-23 MED ORDER — FLUOROURACIL CHEMO INJECTION 2.5 GM/50ML
320.0000 mg/m2 | Freq: Once | INTRAVENOUS | Status: AC
  Administered 2022-02-23: 650 mg via INTRAVENOUS
  Filled 2022-02-23: qty 13

## 2022-02-23 MED ORDER — DEXTROSE 5 % IV SOLN: Freq: Once | INTRAVENOUS | Status: AC

## 2022-02-23 MED ORDER — SODIUM CHLORIDE 0.9 % IV SOLN
1920.0000 mg/m2 | INTRAVENOUS | Status: DC
  Administered 2022-02-23: 3750 mg via INTRAVENOUS
  Filled 2022-02-23: qty 75

## 2022-02-23 MED ORDER — HEPARIN SOD (PORK) LOCK FLUSH 100 UNIT/ML IV SOLN: 500.0000 [IU] | Freq: Once | INTRAVENOUS | Status: DC | PRN

## 2022-02-23 NOTE — Progress Notes (Deleted)
Patient has been examined by Dr. Delton Coombes, and vital signs and labs have been reviewed. ANC (1400), Creatinine, LFTs, hemoglobin, and platelets are within treatment parameters per M.D. - pt may proceed with treatment.  Primary RN and pharmacy notified.

## 2022-02-23 NOTE — Progress Notes (Signed)
Patient presents today for FOLFOX infusion with 5FU pump start per providers order.  Vital signs within parameters for treatment.  Creatinine noted to be 1.81, MD notified.  Patient currently receiving 500 cc bolus of saline per providers order.  Message received from [facility] okay to proceed with treatment.  Treatment given today per MD orders.  Stable during infusion without adverse affects.  Vital signs stable.  No complaints at this time.  Discharge from clinic ambulatory in stable condition.  Alert and oriented X 3.  Follow up with [facility] as scheduled.

## 2022-02-23 NOTE — Patient Instructions (Addendum)
[facility] Discharge Instructions   You were seen and examined today by Dr. Katragadda.  He reviewed the results of your lab work which are normal/stable.   We will proceed with your treatment today.  Return as scheduled.    Thank you for choosing [facility] at [facility] to provide your oncology and hematology care.  To afford each patient quality time with our provider, please arrive at least 15 minutes before your scheduled appointment time.   If you have a lab appointment with the Cancer Center please come in thru the Main Entrance and check in at the main information desk.  You need to re-schedule your appointment should you arrive 10 or more minutes late.  We strive to give you quality time with our providers, and arriving late affects you and other patients whose appointments are after yours.  Also, if you no show three or more times for appointments you may be dismissed from the clinic at the providers discretion.     Again, thank you for choosing [facility].  Our hope is that these requests will decrease the amount of time that you wait before being seen by our physicians.       _____________________________________________________________  Should you have questions after your visit to [facility], please contact our office at [phone] and follow the prompts.  Our office hours are 8:00 a.m. and 4:30 p.m. Monday - Friday.  Please note that voicemails left after 4:00 p.m. may not be returned until the following business day.  We are closed weekends and major holidays.  You do have access to a nurse 24-7, just call the main number to the clinic [phone] and do not press any options, hold on the line and a nurse will answer the phone.    For prescription refill requests, have your pharmacy contact our office and allow 72 hours.    Due to Covid, you will need to wear a mask upon entering  the hospital. If you do not have a mask, a mask will be given to you at the Main Entrance upon arrival. For doctor visits, patients may have 1 support person age 18 or older with them. For treatment visits, patients can not have anyone with them due to social distancing guidelines and our immunocompromised population.      

## 2022-02-23 NOTE — Patient Instructions (Signed)
[facility]  Discharge Instructions: Thank you for choosing [facility] to provide your oncology and hematology care.  If you have a lab appointment with the [facility], please come in thru the Main Entrance and check in at the main information desk.  Wear comfortable clothing and clothing appropriate for easy access to any Portacath or PICC line.   We strive to give you quality time with your provider. You may need to reschedule your appointment if you arrive late (15 or more minutes).  Arriving late affects you and other patients whose appointments are after yours.  Also, if you miss three or more appointments without notifying the office, you may be dismissed from the clinic at the provider's discretion.      For prescription refill requests, have your pharmacy contact our office and allow 72 hours for refills to be completed.    Today you received the following chemotherapy and/or immunotherapy agents Oxaliplatin Leucovorin Fluorouracil      To help prevent nausea and vomiting after your treatment, we encourage you to take your nausea medication as directed.  BELOW ARE SYMPTOMS THAT SHOULD BE REPORTED IMMEDIATELY: *FEVER GREATER THAN 100.4 F (38 C) OR HIGHER *CHILLS OR SWEATING *NAUSEA AND VOMITING THAT IS NOT CONTROLLED WITH YOUR NAUSEA MEDICATION *UNUSUAL SHORTNESS OF BREATH *UNUSUAL BRUISING OR BLEEDING *URINARY PROBLEMS (pain or burning when urinating, or frequent urination) *BOWEL PROBLEMS (unusual diarrhea, constipation, pain near the anus) TENDERNESS IN MOUTH AND THROAT WITH OR WITHOUT PRESENCE OF ULCERS (sore throat, sores in mouth, or a toothache) UNUSUAL RASH, SWELLING OR PAIN  UNUSUAL VAGINAL DISCHARGE OR ITCHING   Items with * indicate a potential emergency and should be followed up as soon as possible or go to the Emergency Department if any problems should occur.  Please show the CHEMOTHERAPY ALERT CARD or IMMUNOTHERAPY ALERT CARD  at check-in to the Emergency Department and triage nurse.  Should you have questions after your visit or need to cancel or reschedule your appointment, please contact [facility] [phone]  and follow the prompts.  Office hours are 8:00 a.m. to 4:30 p.m. Monday - Friday. Please note that voicemails left after 4:00 p.m. may not be returned until the following business day.  We are closed weekends and major holidays. You have access to a nurse at all times for urgent questions. Please call the main number to the clinic [phone] and follow the prompts.  For any non-urgent questions, you may also contact your provider using MyChart. We now offer e-Visits for anyone 5 and older to request care online for non-urgent symptoms. For details visit [email or web address].   Also download the MyChart app! Go to the app store, search "MyChart", open the app, select [facility], and log in with your MyChart username and password.

## 2022-02-23 NOTE — Progress Notes (Signed)
Fernando Dean [address], [place] [address]   CLINIC:  Medical Oncology/Hematology  PCP:  [name], FNP [address] 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 Marcello Moores, right colectomy on 08/18/2021  NGS Results:  MSI-stable  CURRENT THERAPY: Adjuvant FOLFOX  CANCER STAGING:  Cancer Staging  Cancer of left colon [facility]) Staging form: Colon and Rectum, AJCC 8th Edition - Clinical stage from 05/11/2021: Stage IIA (cT3, cN0, cM0) - Unsigned  Primary cancer of hepatic flexure s/p robotic right colectomy 08/18/2021 Staging form: Colon and Rectum, AJCC 8th Edition - Clinical stage from 09/23/2021: Stage IIIB (cT4a, cN1a, cM0) - Unsigned   INTERVAL HISTORY:  [name], a 72 y.o. male, seen for follow-up and toxicity assessment prior to next cycle of FOLFOX.  He denied any nausea or vomiting.  He had diarrhea which was controlled with Imodium.  He had cold sensitivity lasted 2 to 3 days.  Denies any consistent tingling or numbness in extremities.  REVIEW OF SYSTEMS:  Review of Systems  Respiratory:  Positive for shortness of breath.   Gastrointestinal:  Positive for diarrhea.  All other systems reviewed and are negative.   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 - St Jude/[employer] MRI 2272 dual chamber PPM   Tuberculosis    as a child, was treated   Past Surgical History:  Procedure Laterality Date   ABDOMINAL AORTIC ENDOVASCULAR STENT GRAFT Bilateral 02/26/2021   Procedure:  ABDOMINAL AORTIC ENDOVASCULAR STENT GRAFT REPAIR;  Surgeon: Marty Heck, MD;  Location: [facility] OR;  Service: Vascular;  Laterality: Bilateral;   BIOPSY  03/12/2021   Procedure: BIOPSY;  Surgeon: Harvel Quale, MD;  Location[facility];  Service: Gastroenterology;;   BIOPSY  06/25/2021   Procedure: BIOPSY;  Surgeon: Harvel Quale, MD;  [facility];  Service: Gastroenterology;;  mass    COLONOSCOPY WITH PROPOFOL N/A 03/12/2021   Procedure: COLONOSCOPY WITH PROPOFOL;  Surgeon: Harvel Quale, MD;  [facility]: [facility];  Service: Gastroenterology;  Laterality: N/A;  10:55   COLONOSCOPY WITH PROPOFOL N/A 06/25/2021   Procedure: COLONOSCOPY WITH PROPOFOL;  Surgeon: Harvel Quale, MD;  [facility]: [facility];  Service: Gastroenterology;  Laterality: N/A;  235 ASA 2   HEMOSTASIS CLIP PLACEMENT  03/12/2021   Procedure: HEMOSTASIS CLIP PLACEMENT;  Surgeon: Harvel Quale, MD;  [facility];  Service: Gastroenterology;;   HEMOSTASIS CLIP PLACEMENT  06/25/2021   Procedure: HEMOSTASIS CLIP PLACEMENT;  Surgeon: Harvel Quale, MD;  [facility];  Service: Gastroenterology;;   HERNIA REPAIR Left 10/25/2006   IR IMAGING GUIDED PORT INSERTION  09/29/2021   PACEMAKER IMPLANT N/A 02/08/2021   Procedure: PACEMAKER IMPLANT;  Surgeon: Deboraha Sprang, MD;  Location: [facility] CV LAB;  Service: Cardiovascular;  Laterality: N/A;   POLYPECTOMY  03/12/2021   Procedure: POLYPECTOMY;  Surgeon: Harvel Quale, MD;  [facility];  Service: Gastroenterology;;   POLYPECTOMY  06/25/2021   Procedure: POLYPECTOMY;  Surgeon: [name], [name], MD;  [facility]: [facility];  Service: Gastroenterology;;   [name]  03/12/2021   Procedure: [name];  Surgeon: [name], [name], MD;  [facility]: [facility];  Service: Gastroenterology;;    SOCIAL HISTORY:  Social History    Socioeconomic History   Marital status: Single    Spouse name: Not on file   Number of children: Not on file   Years of education: Not on file   Highest education level: Not on file  Occupational History   Not on file  Tobacco Use   Smoking status: Every Day    Packs/day: 0.75    Years: 50.00    Total pack years: 37.50    Types: Cigarettes    Passive exposure: Current   Smokeless tobacco: Never  Vaping Use   Vaping Use: Never used  Substance and Sexual Activity   Alcohol use: Yes    Alcohol/week: 2.0 - 3.0 standard drinks of alcohol    Types: 2 - 3 Shots of liquor per week    Comment: daily liquor moderatly   Drug use: Not on file    Comment: uses every other day   Sexual activity: Not Currently  Other Topics Concern   Not on file  Social History Narrative   Not on file   Social Determinants of Health   Financial Resource Strain: High Risk (10/14/2021)   Overall Financial Resource Strain (CARDIA)    Difficulty of Paying Living Expenses: Hard  Food Insecurity: Not on file  Transportation Needs: Not on file  Physical Activity: Not on file  Stress: Not on file  Social Connections: Not on file  Intimate Partner Violence: Not on file    FAMILY HISTORY:  Family History  Problem Relation Age of Onset   Heart disease Father    Colon polyps Sister        less than 10 lifetime polyps   Colon cancer Maternal Grandmother        dx 90s    CURRENT MEDICATIONS:  Current Outpatient Medications  Medication Sig Dispense Refill   albuterol (VENTOLIN HFA) 108 (90 Base) MCG/ACT inhaler Inhale 2 puffs into the lungs every 6 (six) hours as needed for wheezing or shortness of breath. 8 g 2   amLODipine (NORVASC) 2.5 MG tablet TAKE 1 TABLET(2.5 MG) BY MOUTH DAILY 90 tablet 3   aspirin EC 81 MG EC tablet Take 1 tablet (81 mg total) by mouth daily at 6 (six) AM. Swallow whole. 30 tablet 11   bisacodyl (DULCOLAX) 5 MG EC tablet Take 5 mg by mouth daily as needed for moderate  constipation.     carvedilol (COREG) 6.25 MG tablet Take 1 tablet (6.25 mg total) by mouth 2 (two) times daily with a meal. 60 tablet 3   famotidine (PEPCID) 20 MG tablet Take 20 mg by mouth daily as needed for heartburn or indigestion.     feeding supplement (ENSURE ENLIVE / ENSURE PLUS) LIQD Take 237 mLs by mouth 3 (three) times daily between meals. (Patient taking differently: Take 237 mLs by mouth 3 (three) times daily as needed (nutrition).) 237 mL 12   fluorouracil CALGB 33825 2,400 mg/m2 in sodium chloride 0.9 % 150 mL Inject 2,400 mg/m2 into the vein over 48 hr. Every 14 days     FLUOROURACIL IV Inject into the vein every 14 (fourteen) days.     LEUCOVORIN CALCIUM IV Inject into the vein  every 14 (fourteen) days.     loperamide (IMODIUM A-D) 2 MG tablet Take 1 tablet (2 mg total) by mouth 4 (four) times daily as needed for diarrhea or loose stools.  0   nicotine (NICODERM CQ - DOSED IN MG/24 HOURS) 21 mg/24hr patch Place 21 mg onto the skin daily.     OXALIPLATIN IV Inject into the vein every 14 (fourteen) days.     simvastatin (ZOCOR) 20 MG tablet Take 20 mg by mouth daily.     prochlorperazine (COMPAZINE) 10 MG tablet  (Patient not taking: Reported on 02/23/2022)     No current facility-administered medications for this visit.   Facility-Administered Medications Ordered in Other Visits  Medication Dose Route Frequency Provider Last Rate Last Admin   fluorouracil (ADRUCIL) 3,750 mg in sodium chloride 0.9 % 75 mL chemo infusion  1,920 mg/m2 (Treatment Plan Recorded) Intravenous 1 day or 1 dose Derek Jack, MD   Infusion Verify at 02/23/22 1523   heparin lock flush 100 unit/mL  500 Units Intracatheter Once PRN Derek Jack, MD       magnesium sulfate 2 GM/50ML IVPB            palonosetron (ALOXI) 0.25 MG/5ML injection            sodium chloride flush (NS) 0.9 % injection 10 mL  10 mL Intracatheter PRN Derek Jack, MD   10 mL at 11/18/21 1257   sodium chloride  flush (NS) 0.9 % injection 10 mL  10 mL Intracatheter PRN Derek Jack, MD        ALLERGIES:  Allergies  Allergen Reactions   Lisinopril Anaphylaxis   Naproxen Shortness Of Breath and Palpitations   Nsaids Shortness Of Breath and Palpitations    PHYSICAL EXAM:  Performance status (ECOG): 1 - Symptomatic but completely ambulatory  There were no vitals filed for this visit.   Wt Readings from Last 3 Encounters:  02/23/22 166 lb 3.2 oz (75.4 kg)  02/09/22 166 lb 6.4 oz (75.5 kg)  01/26/22 167 lb 9.6 oz (76 kg)   Physical Exam Vitals reviewed.  Constitutional:      Appearance: Normal appearance.  Cardiovascular:     Rate and Rhythm: Normal rate and regular rhythm.     Pulses: Normal pulses.     Heart sounds: Normal heart sounds.  Pulmonary:     Effort: Pulmonary effort is normal.     Breath sounds: Normal breath sounds.  Neurological:     General: No focal deficit present.     Mental Status: He is alert and oriented to person, place, and time.  Psychiatric:        Mood and Affect: Mood normal.        Behavior: Behavior normal.      LABORATORY DATA:  I have reviewed the labs as listed.     Latest Ref Rng & Units 02/23/2022   10:17 AM 02/09/2022    9:04 AM 01/26/2022   10:19 AM  CBC  WBC 4.0 - 10.5 K/uL 4.9  4.5  4.5   Hemoglobin 13.0 - 17.0 g/dL 11.4  12.7  12.1   Hematocrit 39.0 - 52.0 % 34.5  37.9  36.2   Platelets 150 - 400 K/uL 138  121  112       Latest Ref Rng & Units 02/23/2022   10:17 AM 02/09/2022    9:04 AM 01/26/2022   10:19 AM  CMP  Glucose 70 - 99 mg/dL 93  105  89  BUN 8 - 23 mg/dL _0 Creatinine 0.61 - 1.24 mg/dL 1.81  1.88  1.69   Sodium 135 - 145 mmol/L 135  137  135   Potassium 3.5 - 5.1 mmol/L 4.7  4.9  4.5   Chloride 98 - 111 mmol/L 108  111  110   CO2 22 - 32 mmol/L _1 Calcium 8.9 - 10.3 mg/dL 9.0  8.8  8.7   Total Protein 6.5 - 8.1 g/dL 7.4  7.6  7.4   Total Bilirubin 0.3 - 1.2 mg/dL 0.5  0.8  0.5   Alkaline  Phos 38 - 126 U/L 125  117  122   AST 15 - 41 U/L _2 ALT 0 - 44 U/L _3 DIAGNOSTIC IMAGING:  I have independently reviewed the scans and discussed with the patient. CUP PACEART REMOTE DEVICE CHECK  Result Date: 02/08/2022 Scheduled remote reviewed. Normal device function.  Next remote 91 days. 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 - Colonoscopy on 03/12/2021: Fungating partially obstructing large mass found at 40 cm proximal to the anus, circumferential with no bleeding present. - CEA on 03/12/2021: 8.3 - CT angio abdomen and pelvis on 04/08/2021: No evidence of metastatic lymphadenopathy or liver mets.  Stable 7 mm subpleural pulmonary nodule in the periphery of the right lower lobe. - CT angio CAP on 02/04/2021: No evidence of lung metastasis, abdominal metastasis. - Resection of splenic flexure mass on 04/23/2021 by Dr. Marcello Moores. - Pathology: Moderately differentiated adenocarcinoma, invades into subserosa, margins negative, 0/38 lymph nodes.  No LVI or perineural invasion.  PT3 pN0.  MSI-stable. - Guardant reveal (05/21/2021): CT DNA was detected. - PET scan on 06/10/2021 shows 2 cm hypermetabolic soft tissue density in the transverse colon.  No evidence of local or distant metastatic disease.    Social/family history: - He is a retired [employer]. - Current active smoker, half pack per day for 50 years. - Mother had colon cancer at age 37.  Maternal grandmother had colon cancer.  3.  Stage III (Z7QD6) right colon adenocarcinoma: - Right colectomy on 08/10/2021 by Dr. Marcello Moores. - Pathology with grade 2 moderately differentiated colonic adenocarcinoma, tumor site ascending colon, margins negative, 1/22 lymph nodes involved, [id number].  MMR preserved.        -Cycle 1 of FOLFOX started on 10/05/2021   PLAN:  Stage III (T4AN1) right colon adenocarcinoma, MMR preserved: - He had cold sensitivity  lasted 2 to 3 days after last cycle.  Denies any tingling or numbness in extremities. - Had diarrhea which is controlled with Imodium.  No other GI symptoms. - Reviewed labs today which showed creatinine 1.81.  He will receive additional final mL of normal saline.  CBC shows platelet count is 138 and normal white count.  Proceed with cycle 10 today.  RTC 2 weeks for follow-up.  2.  Normocytic anemia: - Combination anemia from CKD and relative iron deficiency. - Hemoglobin is 11.4 and stable.  Consider ferritin and iron panel.  3.  Chemotherapy-induced diarrhea: - Continue Imodium as needed which is helping.   Orders placed this encounter:  No orders of the defined types were placed in this encounter.    Derek Jack, MD [place] [phone]

## 2022-02-24 LAB — CEA: CEA: 6.4 ng/mL — ABNORMAL HIGH (ref 0.0–4.7)

## 2022-02-25 ENCOUNTER — Inpatient Hospital Stay: Payer: Medicare Other

## 2022-02-25 VITALS — BP 121/77 | HR 79 | Temp 97.5°F | Resp 20

## 2022-02-25 DIAGNOSIS — C183 Malignant neoplasm of hepatic flexure: Secondary | ICD-10-CM

## 2022-02-25 DIAGNOSIS — D631 Anemia in chronic kidney disease: Secondary | ICD-10-CM | POA: Diagnosis not present

## 2022-02-25 DIAGNOSIS — Z452 Encounter for adjustment and management of vascular access device: Secondary | ICD-10-CM | POA: Diagnosis not present

## 2022-02-25 DIAGNOSIS — Z5111 Encounter for antineoplastic chemotherapy: Secondary | ICD-10-CM | POA: Diagnosis not present

## 2022-02-25 DIAGNOSIS — Z95828 Presence of other vascular implants and grafts: Secondary | ICD-10-CM

## 2022-02-25 DIAGNOSIS — R197 Diarrhea, unspecified: Secondary | ICD-10-CM | POA: Diagnosis not present

## 2022-02-25 DIAGNOSIS — N189 Chronic kidney disease, unspecified: Secondary | ICD-10-CM | POA: Diagnosis not present

## 2022-02-25 MED ORDER — SODIUM CHLORIDE 0.9% FLUSH
10.0000 mL | INTRAVENOUS | Status: DC | PRN
  Administered 2022-02-25: 10 mL

## 2022-02-25 MED ORDER — HEPARIN SOD (PORK) LOCK FLUSH 100 UNIT/ML IV SOLN
500.0000 [IU] | Freq: Once | INTRAVENOUS | Status: AC | PRN
  Administered 2022-02-25: 500 [IU]

## 2022-02-25 NOTE — Patient Instructions (Signed)
[facility]  Discharge Instructions: Thank you for choosing [facility] to provide your oncology and hematology care.  If you have a lab appointment with the [facility], please come in thru the Main Entrance and check in at the main information desk.  Wear comfortable clothing and clothing appropriate for easy access to any Portacath or PICC line.   We strive to give you quality time with your provider. You may need to reschedule your appointment if you arrive late (15 or more minutes).  Arriving late affects you and other patients whose appointments are after yours.  Also, if you miss three or more appointments without notifying the office, you may be dismissed from the clinic at the provider's discretion.      For prescription refill requests, have your pharmacy contact our office and allow 72 hours for refills to be completed.    Today you received 5FU chemotherapy pump d/c   To help prevent nausea and vomiting after your treatment, we encourage you to take your nausea medication as directed.  BELOW ARE SYMPTOMS THAT SHOULD BE REPORTED IMMEDIATELY: *FEVER GREATER THAN 100.4 F (38 C) OR HIGHER *CHILLS OR SWEATING *NAUSEA AND VOMITING THAT IS NOT CONTROLLED WITH YOUR NAUSEA MEDICATION *UNUSUAL SHORTNESS OF BREATH *UNUSUAL BRUISING OR BLEEDING *URINARY PROBLEMS (pain or burning when urinating, or frequent urination) *BOWEL PROBLEMS (unusual diarrhea, constipation, pain near the anus) TENDERNESS IN MOUTH AND THROAT WITH OR WITHOUT PRESENCE OF ULCERS (sore throat, sores in mouth, or a toothache) UNUSUAL RASH, SWELLING OR PAIN  UNUSUAL VAGINAL DISCHARGE OR ITCHING   Items with * indicate a potential emergency and should be followed up as soon as possible or go to the Emergency Department if any problems should occur.  Please show the CHEMOTHERAPY ALERT CARD or IMMUNOTHERAPY ALERT CARD at check-in to the Emergency Department and triage nurse.  Should  you have questions after your visit or need to cancel or reschedule your appointment, please contact [facility] [phone]  and follow the prompts.  Office hours are 8:00 a.m. to 4:30 p.m. Monday - Friday. Please note that voicemails left after 4:00 p.m. may not be returned until the following business day.  We are closed weekends and major holidays. You have access to a nurse at all times for urgent questions. Please call the main number to the clinic [phone] and follow the prompts.  For any non-urgent questions, you may also contact your provider using MyChart. We now offer e-Visits for anyone 109 and older to request care online for non-urgent symptoms. For details visit [email or web address].   Also download the MyChart app! Go to the app store, search "MyChart", open the app, select [facility], and log in with your MyChart username and password.

## 2022-02-25 NOTE — Progress Notes (Signed)
Pt presents today for chemotherapy 5FU pump disconnection per provider's order. Vital signs stable, port flushed easily without difficulty with 10 mL of normal saline and 50m of heparin. Good blood return noted and no bruising or swelling at the site.  Discharged from clinic ambulatory in stable condition. Alert and oriented x 3. [facility]

## 2022-03-07 DIAGNOSIS — C183 Malignant neoplasm of hepatic flexure: Secondary | ICD-10-CM | POA: Diagnosis not present

## 2022-03-09 ENCOUNTER — Inpatient Hospital Stay: Payer: Medicare Other | Admitting: Hematology

## 2022-03-09 ENCOUNTER — Inpatient Hospital Stay: Payer: Medicare Other

## 2022-03-09 VITALS — BP 132/79 | HR 66 | Temp 97.6°F | Resp 16

## 2022-03-09 DIAGNOSIS — D631 Anemia in chronic kidney disease: Secondary | ICD-10-CM | POA: Diagnosis not present

## 2022-03-09 DIAGNOSIS — C183 Malignant neoplasm of hepatic flexure: Secondary | ICD-10-CM | POA: Diagnosis not present

## 2022-03-09 DIAGNOSIS — Z95828 Presence of other vascular implants and grafts: Secondary | ICD-10-CM

## 2022-03-09 DIAGNOSIS — Z452 Encounter for adjustment and management of vascular access device: Secondary | ICD-10-CM | POA: Diagnosis not present

## 2022-03-09 DIAGNOSIS — Z5111 Encounter for antineoplastic chemotherapy: Secondary | ICD-10-CM | POA: Diagnosis not present

## 2022-03-09 DIAGNOSIS — N189 Chronic kidney disease, unspecified: Secondary | ICD-10-CM | POA: Diagnosis not present

## 2022-03-09 DIAGNOSIS — R197 Diarrhea, unspecified: Secondary | ICD-10-CM | POA: Diagnosis not present

## 2022-03-09 LAB — CBC WITH DIFFERENTIAL/PLATELET
Abs Immature Granulocytes: 0.01 10*3/uL (ref 0.00–0.07)
Basophils Absolute: 0 10*3/uL (ref 0.0–0.1)
Basophils Relative: 1 %
Eosinophils Absolute: 0.1 10*3/uL (ref 0.0–0.5)
Eosinophils Relative: 2 %
HCT: 34.4 % — ABNORMAL LOW (ref 39.0–52.0)
Hemoglobin: 11.3 g/dL — ABNORMAL LOW (ref 13.0–17.0)
Immature Granulocytes: 0 %
Lymphocytes Relative: 45 %
Lymphs Abs: 2.1 10*3/uL (ref 0.7–4.0)
MCH: 33.4 pg (ref 26.0–34.0)
MCHC: 32.8 g/dL (ref 30.0–36.0)
MCV: 101.8 fL — ABNORMAL HIGH (ref 80.0–100.0)
Monocytes Absolute: 0.7 10*3/uL (ref 0.1–1.0)
Monocytes Relative: 15 %
Neutro Abs: 1.8 10*3/uL (ref 1.7–7.7)
Neutrophils Relative %: 37 %
Platelets: 110 10*3/uL — ABNORMAL LOW (ref 150–400)
RBC: 3.38 MIL/uL — ABNORMAL LOW (ref 4.22–5.81)
RDW: 16.1 % — ABNORMAL HIGH (ref 11.5–15.5)
WBC: 4.7 10*3/uL (ref 4.0–10.5)
nRBC: 0 % (ref 0.0–0.2)

## 2022-03-09 LAB — COMPREHENSIVE METABOLIC PANEL
ALT: 12 U/L (ref 0–44)
AST: 25 U/L (ref 15–41)
Albumin: 3.5 g/dL (ref 3.5–5.0)
Alkaline Phosphatase: 111 U/L (ref 38–126)
Anion gap: 8 (ref 5–15)
BUN: 18 mg/dL (ref 8–23)
CO2: 17 mmol/L — ABNORMAL LOW (ref 22–32)
Calcium: 9.2 mg/dL (ref 8.9–10.3)
Chloride: 112 mmol/L — ABNORMAL HIGH (ref 98–111)
Creatinine, Ser: 1.85 mg/dL — ABNORMAL HIGH (ref 0.61–1.24)
GFR, Estimated: 38 mL/min — ABNORMAL LOW (ref >60)
Glucose, Bld: 141 mg/dL — ABNORMAL HIGH (ref 70–99)
Potassium: 4 mmol/L (ref 3.5–5.1)
Sodium: 137 mmol/L (ref 135–145)
Total Bilirubin: 0.5 mg/dL (ref 0.3–1.2)
Total Protein: 7.3 g/dL (ref 6.5–8.1)

## 2022-03-09 LAB — MAGNESIUM: Magnesium: 1.8 mg/dL (ref 1.7–2.4)

## 2022-03-09 MED ORDER — SODIUM CHLORIDE 0.9% FLUSH
10.0000 mL | INTRAVENOUS | Status: DC | PRN
  Administered 2022-03-09: 10 mL via INTRAVENOUS

## 2022-03-09 MED ORDER — SODIUM CHLORIDE 0.9 % IV SOLN
10.0000 mg | Freq: Once | INTRAVENOUS | Status: AC
  Administered 2022-03-09: 10 mg via INTRAVENOUS
  Filled 2022-03-09: qty 10

## 2022-03-09 MED ORDER — SODIUM CHLORIDE 0.9 % IV SOLN
1920.0000 mg/m2 | INTRAVENOUS | Status: DC
  Administered 2022-03-09: 3750 mg via INTRAVENOUS
  Filled 2022-03-09: qty 75

## 2022-03-09 MED ORDER — OXALIPLATIN CHEMO INJECTION 100 MG/20ML
68.0000 mg/m2 | Freq: Once | INTRAVENOUS | Status: AC
  Administered 2022-03-09: 135 mg via INTRAVENOUS
  Filled 2022-03-09: qty 20

## 2022-03-09 MED ORDER — DEXTROSE 5 % IV SOLN: Freq: Once | INTRAVENOUS | Status: AC

## 2022-03-09 MED ORDER — HEPARIN SOD (PORK) LOCK FLUSH 100 UNIT/ML IV SOLN: 500.0000 [IU] | Freq: Once | INTRAVENOUS | Status: DC | PRN

## 2022-03-09 MED ORDER — FLUOROURACIL CHEMO INJECTION 2.5 GM/50ML
320.0000 mg/m2 | Freq: Once | INTRAVENOUS | Status: AC
  Administered 2022-03-09: 650 mg via INTRAVENOUS
  Filled 2022-03-09: qty 13

## 2022-03-09 MED ORDER — LEUCOVORIN CALCIUM INJECTION 350 MG
320.0000 mg/m2 | Freq: Once | INTRAVENOUS | Status: AC
  Administered 2022-03-09: 628 mg via INTRAVENOUS
  Filled 2022-03-09: qty 31.4

## 2022-03-09 MED ORDER — SODIUM CHLORIDE 0.9% FLUSH: 10.0000 mL | INTRAVENOUS | Status: DC | PRN

## 2022-03-09 MED ORDER — SODIUM CHLORIDE 0.9 % IV SOLN: Freq: Once | INTRAVENOUS | Status: AC

## 2022-03-09 MED ORDER — PALONOSETRON HCL INJECTION 0.25 MG/5ML
0.2500 mg | Freq: Once | INTRAVENOUS | Status: AC
  Administered 2022-03-09: 0.25 mg via INTRAVENOUS
  Filled 2022-03-09: qty 5

## 2022-03-09 NOTE — Progress Notes (Signed)
Fernando Dean, [place] [address]   CLINIC:  Medical Oncology/Hematology  PCP:  [name], FNP [address] [name] [address] [phone]   REASON FOR VISIT:  Follow-up for stage 3B right colon cancer  PRIOR THERAPY: Resection of splenic flexure mass on 04/23/2021 by Dr. Marcello Moores, right colectomy on 08/18/2021  NGS Results:  MSI-stable  CURRENT THERAPY: Adjuvant FOLFOX  CANCER STAGING:  Cancer Staging  Cancer of left colon [facility]) Staging form: Colon and Rectum, AJCC 8th Edition - Clinical stage from 05/11/2021: Stage IIA (cT3, cN0, cM0) - Unsigned  Primary cancer of hepatic flexure s/p robotic right colectomy 08/18/2021 Staging form: Colon and Rectum, AJCC 8th Edition - Clinical stage from 09/23/2021: Stage IIIB (cT4a, cN1a, cM0) - Unsigned   INTERVAL HISTORY:  [name], a 72 y.o. male, seen for follow-up and toxicity assessment prior to cycle 11 of FOLFOX.  He reports energy levels of 70%.  Diarrhea has been stable.  Reports cold sensitivity which lasted about 10 days.  REVIEW OF SYSTEMS:  Review of Systems  Gastrointestinal:  Positive for diarrhea.  All other systems reviewed and are negative.   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 - [employer] MRI 2272 dual chamber PPM   Tuberculosis    as a child, was treated   Past Surgical History:  Procedure Laterality Date   ABDOMINAL AORTIC ENDOVASCULAR STENT GRAFT Bilateral 02/26/2021   Procedure: ABDOMINAL AORTIC ENDOVASCULAR STENT GRAFT REPAIR;  Surgeon: Marty Heck, MD;  [facility];  Service: Vascular;  Laterality:  Bilateral;   BIOPSY  03/12/2021   Procedure: BIOPSY;  Surgeon: Harvel Quale, MD;  [facility];  Service: Gastroenterology;;   BIOPSY  06/25/2021   Procedure: BIOPSY;  Surgeon: Harvel Quale, MD;  [facility];  Service: Gastroenterology;;  mass    COLONOSCOPY WITH PROPOFOL N/A 03/12/2021   Procedure: COLONOSCOPY WITH PROPOFOL;  Surgeon: Harvel Quale, MD;  [facility];  Service: Gastroenterology;  Laterality: N/A;  10:55   COLONOSCOPY WITH PROPOFOL N/A 06/25/2021   Procedure: COLONOSCOPY WITH PROPOFOL;  Surgeon: Harvel Quale, MD;  [facility];  Service: Gastroenterology;  Laterality: N/A;  235 ASA 2   HEMOSTASIS CLIP PLACEMENT  03/12/2021   Procedure: HEMOSTASIS CLIP PLACEMENT;  Surgeon: Harvel Quale, MD;  [facility];  Service: Gastroenterology;;   HEMOSTASIS CLIP PLACEMENT  06/25/2021   Procedure: HEMOSTASIS CLIP PLACEMENT;  Surgeon: Harvel Quale, MD;  [facility];  Service: Gastroenterology;;   HERNIA REPAIR Left 10/25/2006   IR IMAGING GUIDED PORT INSERTION  09/29/2021   PACEMAKER IMPLANT N/A 02/08/2021   Procedure: PACEMAKER IMPLANT;  Surgeon: Deboraha Sprang, MD;  Location: [facility];  Service: Cardiovascular;  Laterality: N/A;   POLYPECTOMY  03/12/2021   Procedure: POLYPECTOMY;  Surgeon: Harvel Quale, MD;  [facility];  Service: Gastroenterology;;   POLYPECTOMY  06/25/2021   Procedure: POLYPECTOMY;  Surgeon: Harvel Quale, MD;  [facility];  Service: Gastroenterology;;   [name]  03/12/2021   Procedure: [name];  Surgeon: [name], [name], MD;  [facility];  Service: Gastroenterology;;    SOCIAL HISTORY:  Social History   Socioeconomic History   Marital status: Single    Spouse name: Not on file   Number of children: Not on file   Years of education: Not on  file   Highest education level: Not on file  Occupational History   Not on file  Tobacco Use   Smoking status: Every Day    Packs/day: 0.75    Years: 50.00    Total pack years: 37.50    Types: Cigarettes    Passive exposure: Current   Smokeless tobacco: Never  Vaping Use   Vaping Use: Never used  Substance and Sexual Activity   Alcohol use: Yes    Alcohol/week: 2.0 - 3.0 standard drinks of alcohol    Types: 2 - 3 Shots of liquor per week    Comment: daily liquor moderatly   Drug use: Not on file    Comment: uses every other day   Sexual activity: Not Currently  Other Topics Concern   Not on file  Social History Narrative   Not on file   Social Determinants of Health   Financial Resource Strain: High Risk (10/14/2021)   Overall Financial Resource Strain (CARDIA)    Difficulty of Paying Living Expenses: Hard  Food Insecurity: Not on file  Transportation Needs: Not on file  Physical Activity: Not on file  Stress: Not on file  Social Connections: Not on file  Intimate Partner Violence: Not on file    FAMILY HISTORY:  Family History  Problem Relation Age of Onset   Heart disease Father    Colon polyps Sister        less than 10 lifetime polyps   Colon cancer Maternal Grandmother        dx 90s    CURRENT MEDICATIONS:  Current Outpatient Medications  Medication Sig Dispense Refill   albuterol (VENTOLIN HFA) 108 (90 Base) MCG/ACT inhaler Inhale 2 puffs into the lungs every 6 (six) hours as needed for wheezing or shortness of breath. 8 g 2   amLODipine (NORVASC) 2.5 MG tablet TAKE 1 TABLET(2.5 MG) BY MOUTH DAILY 90 tablet 3   aspirin EC 81 MG EC tablet Take 1 tablet (81 mg total) by mouth daily at 6 (six) AM. Swallow whole. 30 tablet 11   bisacodyl (DULCOLAX) 5 MG EC tablet Take 5 mg by mouth daily as needed for moderate constipation.     carvedilol (COREG) 6.25 MG tablet Take 1 tablet (6.25 mg total) by mouth 2 (two) times daily with a meal. 60 tablet 3   famotidine  (PEPCID) 20 MG tablet Take 20 mg by mouth daily as needed for heartburn or indigestion.     feeding supplement (ENSURE ENLIVE / ENSURE PLUS) LIQD Take 237 mLs by mouth 3 (three) times daily between meals. (Patient taking differently: Take 237 mLs by mouth 3 (three) times daily as needed (nutrition).) 237 mL 12   fluorouracil CALGB 89211 2,400 mg/m2 in sodium chloride 0.9 % 150 mL Inject 2,400 mg/m2 into the vein over 48 hr. Every 14 days     FLUOROURACIL IV Inject into the vein every 14 (fourteen) days.     LEUCOVORIN CALCIUM IV Inject into the vein every 14 (fourteen) days.     loperamide (IMODIUM A-D) 2 MG tablet Take 1 tablet (2 mg total) by mouth 4 (  four) times daily as needed for diarrhea or loose stools.  0   nicotine (NICODERM CQ - DOSED IN MG/24 HOURS) 21 mg/24hr patch Place 21 mg onto the skin daily.     OXALIPLATIN IV Inject into the vein every 14 (fourteen) days.     simvastatin (ZOCOR) 20 MG tablet Take 20 mg by mouth daily.     prochlorperazine (COMPAZINE) 10 MG tablet  (Patient not taking: Reported on 03/09/2022)     No current facility-administered medications for this visit.   Facility-Administered Medications Ordered in Other Visits  Medication Dose Route Frequency Provider Last Rate Last Admin   magnesium sulfate 2 GM/50ML IVPB            palonosetron (ALOXI) 0.25 MG/5ML injection            sodium chloride flush (NS) 0.9 % injection 10 mL  10 mL Intracatheter PRN Derek Jack, MD   10 mL at 11/18/21 1257    ALLERGIES:  Allergies  Allergen Reactions   Lisinopril Anaphylaxis   Naproxen Shortness Of Breath and Palpitations   Nsaids Shortness Of Breath and Palpitations    PHYSICAL EXAM:  Performance status (ECOG): 1 - Symptomatic but completely ambulatory  There were no vitals filed for this visit.   Wt Readings from Last 3 Encounters:  03/09/22 167 lb 9.6 oz (76 kg)  02/23/22 166 lb 3.2 oz (75.4 kg)  02/09/22 166 lb 6.4 oz (75.5 kg)   Physical  Exam Vitals reviewed.  Constitutional:      Appearance: Normal appearance.  Cardiovascular:     Rate and Rhythm: Normal rate and regular rhythm.     Pulses: Normal pulses.     Heart sounds: Normal heart sounds.  Pulmonary:     Effort: Pulmonary effort is normal.     Breath sounds: Normal breath sounds.  Neurological:     General: No focal deficit present.     Mental Status: He is alert and oriented to person, place, and time.  Psychiatric:        Mood and Affect: Mood normal.        Behavior: Behavior normal.      LABORATORY DATA:  I have reviewed the labs as listed.     Latest Ref Rng & Units 03/09/2022    9:21 AM 02/23/2022   10:17 AM 02/09/2022    9:04 AM  CBC  WBC 4.0 - 10.5 K/uL 4.7  4.9  4.5   Hemoglobin 13.0 - 17.0 g/dL 11.3  11.4  12.7   Hematocrit 39.0 - 52.0 % 34.4  34.5  37.9   Platelets 150 - 400 K/uL 110  138  121       Latest Ref Rng & Units 02/23/2022   10:17 AM 02/09/2022    9:04 AM 01/26/2022   10:19 AM  CMP  Glucose 70 - 99 mg/dL 93  105  89   BUN 8 - 23 mg/dL '24  28  25   '$ Creatinine 0.61 - 1.24 mg/dL 1.81  1.88  1.69   Sodium 135 - 145 mmol/L 135  137  135   Potassium 3.5 - 5.1 mmol/L 4.7  4.9  4.5   Chloride 98 - 111 mmol/L 108  111  110   CO2 22 - 32 mmol/L '19  19  18   '$ Calcium 8.9 - 10.3 mg/dL 9.0  8.8  8.7   Total Protein 6.5 - 8.1 g/dL 7.4  7.6  7.4   Total Bilirubin 0.3 -  1.2 mg/dL 0.5  0.8  0.5   Alkaline Phos 38 - 126 U/L 125  117  122   AST 15 - 41 U/L '19  24  19   '$ ALT 0 - 44 U/L '10  13  11     '$ DIAGNOSTIC IMAGING:  I have independently reviewed the scans and discussed with the patient. No results found.   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 - Colonoscopy on 03/12/2021: Fungating partially obstructing large mass found at 40 cm proximal to the anus, circumferential with no bleeding present. - CEA on 03/12/2021: 8.3 - CT angio abdomen and pelvis on 04/08/2021: No  evidence of metastatic lymphadenopathy or liver mets.  Stable 7 mm subpleural pulmonary nodule in the periphery of the right lower lobe. - CT angio CAP on 02/04/2021: No evidence of lung metastasis, abdominal metastasis. - Resection of splenic flexure mass on 04/23/2021 by Dr. Marcello Moores. - Pathology: Moderately differentiated adenocarcinoma, invades into subserosa, margins negative, 0/38 lymph nodes.  No LVI or perineural invasion.  PT3 pN0.  MSI-stable. - Guardant reveal (05/21/2021): CT DNA was detected. - PET scan on 06/10/2021 shows 2 cm hypermetabolic soft tissue density in the transverse colon.  No evidence of local or distant metastatic disease.    Social/family history: - He is a retired [employer]. - Current active smoker, half pack per day for 50 years. - Mother had colon cancer at age 41.  Maternal grandmother had colon cancer.  3.  Stage III (A5BX0) right colon adenocarcinoma: - Right colectomy on 08/10/2021 by Dr. Marcello Moores. - Pathology with grade 2 moderately differentiated colonic adenocarcinoma, tumor site ascending colon, margins negative, 1/22 lymph nodes involved, [id number].  MMR preserved.        -Cycle 1 of FOLFOX started on 10/05/2021   PLAN:  Stage III (T4AN1) right colon adenocarcinoma, MMR preserved: - He has cold sensitivity lasting up to 7 to 10 days after last cycle.  Denies any tingling or numbness in the extremities. - Diarrhea is well-controlled with Imodium. - Reviewed labs today which showed normal LFTs and normal CBC.  Platelet count is 110 and stable. - Last CEA was 6.4 on 02/23/2022. - He will proceed with cycle 11 with 20% dose reduction.  He will receive 500 mL normal saline as bolus.  RTC 2 weeks for follow-up.  2.  Normocytic anemia: - Combination anemia from CKD, relative iron deficiency and myelosuppression. - Hemoglobin today is 11.3 and stable.  Will consider anemia panel if hemoglobin drops below 10.  3.  Chemotherapy-induced diarrhea: - Continue  Imodium as needed.  It is helping.   Orders placed this encounter:  No orders of the defined types were placed in this encounter.    Derek Jack, MD [place] [phone]

## 2022-03-09 NOTE — Progress Notes (Signed)
Patients port flushed without difficulty.  Good blood return noted with no bruising or swelling noted at site.  Patient remains accessed for chemotherapy treatment.  

## 2022-03-09 NOTE — Patient Instructions (Addendum)
[facility]  Discharge Instructions  You were seen and examined today by Dr. Delton Coombes.  Proceed with treatment as planned. This is your 11th treatment, you will receive a total of 12 treatments.  Dr. Delton Coombes has ordered extra fluids today due to your kidney function.  Follow-up as scheduled.  Thank you for choosing [facility] to provide your oncology and hematology care.   To afford each patient quality time with our provider, please arrive at least 15 minutes before your scheduled appointment time. You may need to reschedule your appointment if you arrive late (10 or more minutes). Arriving late affects you and other patients whose appointments are after yours.  Also, if you miss three or more appointments without notifying the office, you may be dismissed from the clinic at the provider's discretion.    Again, thank you for choosing [facility].  Our hope is that these requests will decrease the amount of time that you wait before being seen by our physicians.   If you have a lab appointment with the [facility] please come in thru the Main Entrance and check in at the main information desk.           _____________________________________________________________  Should you have questions after your visit to [facility], please contact our office at [phone] and follow the prompts.  Our office hours are 8:00 a.m. to 4:30 p.m. Monday - Thursday and 8:00 a.m. to 2:30 p.m. Friday.  Please note that voicemails left after 4:00 p.m. may not be returned until the following business day.  We are closed weekends and all major holidays.  You do have access to a nurse 24-7, just call the main number to the clinic [phone] and do not press any options, hold on the line and a nurse will answer the phone.    For prescription refill requests, have your pharmacy contact our office and allow 72 hours.     Masks are optional in the cancer centers. If you would like for your care team to wear a mask while they are taking care of you, please let them know. You may have one support person who is at least 72 years old accompany you for your appointments.

## 2022-03-09 NOTE — Patient Instructions (Signed)
[facility]  Discharge Instructions: Thank you for choosing [facility] to provide your oncology and hematology care.  If you have a lab appointment with the Cancer Center, please come in thru the Main Entrance and check in at the main information desk.  Wear comfortable clothing and clothing appropriate for easy access to any Portacath or PICC line.   We strive to give you quality time with your provider. You may need to reschedule your appointment if you arrive late (15 or more minutes).  Arriving late affects you and other patients whose appointments are after yours.  Also, if you miss three or more appointments without notifying the office, you may be dismissed from the clinic at the provider's discretion.      For prescription refill requests, have your pharmacy contact our office and allow 72 hours for refills to be completed.    Today you received the following chemotherapy and/or immunotherapy agents FOLFOX with 5FU pump start      To help prevent nausea and vomiting after your treatment, we encourage you to take your nausea medication as directed.  BELOW ARE SYMPTOMS THAT SHOULD BE REPORTED IMMEDIATELY: *FEVER GREATER THAN 100.4 F (38 C) OR HIGHER *CHILLS OR SWEATING *NAUSEA AND VOMITING THAT IS NOT CONTROLLED WITH YOUR NAUSEA MEDICATION *UNUSUAL SHORTNESS OF BREATH *UNUSUAL BRUISING OR BLEEDING *URINARY PROBLEMS (pain or burning when urinating, or frequent urination) *BOWEL PROBLEMS (unusual diarrhea, constipation, pain near the anus) TENDERNESS IN MOUTH AND THROAT WITH OR WITHOUT PRESENCE OF ULCERS (sore throat, sores in mouth, or a toothache) UNUSUAL RASH, SWELLING OR PAIN  UNUSUAL VAGINAL DISCHARGE OR ITCHING   Items with * indicate a potential emergency and should be followed up as soon as possible or go to the Emergency Department if any problems should occur.  Please show the CHEMOTHERAPY ALERT CARD or IMMUNOTHERAPY ALERT CARD at  check-in to the Emergency Department and triage nurse.  Should you have questions after your visit or need to cancel or reschedule your appointment, please contact [facility] [phone]  and follow the prompts.  Office hours are 8:00 a.m. to 4:30 p.m. Monday - Friday. Please note that voicemails left after 4:00 p.m. may not be returned until the following business day.  We are closed weekends and major holidays. You have access to a nurse at all times for urgent questions. Please call the main number to the clinic [phone] and follow the prompts.  For any non-urgent questions, you may also contact your provider using MyChart. We now offer e-Visits for anyone 18 and older to request care online for non-urgent symptoms. For details visit [email or web address].   Also download the MyChart app! Go to the app store, search "MyChart", open the app, select [address], and log in with your MyChart username and password.   

## 2022-03-09 NOTE — Progress Notes (Signed)
OK to proceed with treatment with creatinine 1.85. Patient to get 500 ml NS in addition to treatment.  T.O. Dr Rhys Martini, PharmD

## 2022-03-09 NOTE — Progress Notes (Signed)
Patient presents today for FOLFOX with 5FU pump start per providers order.  Vital signs and labs reviewed by the MD.  Message received from Adonis Huguenin RN/Dr. Delton Coombes patient okay for treatment.  Patient will also receive a 500 cc bolus of saline with treatment per provider.  Treatment given today per MD orders.  Tolerated infusion without adverse affects.  5FU pump connected and verified RUN on the screen with the patient.  Vital signs stable.  No complaints at this time.  Discharge from clinic ambulatory in stable condition.  Alert and oriented X 3.  Follow up with [facility] as scheduled.

## 2022-03-09 NOTE — Progress Notes (Signed)
Patient has been assessed, vital signs and labs have been reviewed by Dr. Delton Coombes. ANC, Creatinine, LFTs, and Platelets are within treatment parameters per Dr. Delton Coombes. The patient is good to proceed with treatment at this time. Patient to receive 587m NS over 1 hour today with treatment per Dr. KDelton Coombes Primary RN and pharmacy aware.

## 2022-03-11 ENCOUNTER — Inpatient Hospital Stay: Payer: Medicare Other

## 2022-03-11 VITALS — BP 121/65 | HR 76 | Temp 97.7°F | Resp 18

## 2022-03-11 DIAGNOSIS — C183 Malignant neoplasm of hepatic flexure: Secondary | ICD-10-CM

## 2022-03-11 DIAGNOSIS — Z452 Encounter for adjustment and management of vascular access device: Secondary | ICD-10-CM | POA: Diagnosis not present

## 2022-03-11 DIAGNOSIS — R197 Diarrhea, unspecified: Secondary | ICD-10-CM | POA: Diagnosis not present

## 2022-03-11 DIAGNOSIS — N189 Chronic kidney disease, unspecified: Secondary | ICD-10-CM | POA: Diagnosis not present

## 2022-03-11 DIAGNOSIS — Z5111 Encounter for antineoplastic chemotherapy: Secondary | ICD-10-CM | POA: Diagnosis not present

## 2022-03-11 DIAGNOSIS — D631 Anemia in chronic kidney disease: Secondary | ICD-10-CM | POA: Diagnosis not present

## 2022-03-11 DIAGNOSIS — Z95828 Presence of other vascular implants and grafts: Secondary | ICD-10-CM

## 2022-03-11 MED ORDER — HEPARIN SOD (PORK) LOCK FLUSH 100 UNIT/ML IV SOLN
500.0000 [IU] | Freq: Once | INTRAVENOUS | Status: AC | PRN
  Administered 2022-03-11: 500 [IU]

## 2022-03-11 MED ORDER — SODIUM CHLORIDE 0.9% FLUSH
10.0000 mL | INTRAVENOUS | Status: DC | PRN
  Administered 2022-03-11: 10 mL

## 2022-03-11 NOTE — Progress Notes (Signed)
Patient presents today for pump d/c. Vital signs are stable. Port a cath site clean, dry, and intact. Port flushed with 10 mls of Normal Saline and 500 Units of Heparin. Needle removed intact. Band aid applied. Patient has no complaints at this time. Discharged from clinic ambulatory and in stable condition. Patient alert and oriented.  

## 2022-03-11 NOTE — Patient Instructions (Signed)
[facility]  Discharge Instructions: Thank you for choosing [facility] to provide your oncology and hematology care.  If you have a lab appointment with the Cancer Center, please come in thru the Main Entrance and check in at the main information desk.  Wear comfortable clothing and clothing appropriate for easy access to any Portacath or PICC line.   We strive to give you quality time with your provider. You may need to reschedule your appointment if you arrive late (15 or more minutes).  Arriving late affects you and other patients whose appointments are after yours.  Also, if you miss three or more appointments without notifying the office, you may be dismissed from the clinic at the provider's discretion.      For prescription refill requests, have your pharmacy contact our office and allow 72 hours for refills to be completed.    Today you received the following chemotherapy and/or immunotherapy agents Adrucil/5FU. Fluorouracil Injection What is this medication? FLUOROURACIL (flure oh YOOR a sil) treats some types of cancer. It works by slowing down the growth of cancer cells. This medicine may be used for other purposes; ask your health care provider or pharmacist if you have questions. COMMON BRAND NAME(S): Adrucil What should I tell my care team before I take this medication? They need to know if you have any of these conditions: Blood disorders Dihydropyrimidine dehydrogenase (DPD) deficiency Infection, such as chickenpox, cold sores, herpes Kidney disease Liver disease Poor nutrition Recent or ongoing radiation therapy An unusual or allergic reaction to fluorouracil, other medications, foods, dyes, or preservatives If you or your partner are pregnant or trying to get pregnant Breast-feeding How should I use this medication? This medication is injected into a vein. It is administered by your care team in a hospital or clinic setting. Talk to  your care team about the use of this medication in children. Special care may be needed. Overdosage: If you think you have taken too much of this medicine contact a poison control center or emergency room at once. NOTE: This medicine is only for you. Do not share this medicine with others. What if I miss a dose? Keep appointments for follow-up doses. It is important not to miss your dose. Call your care team if you are unable to keep an appointment. What may interact with this medication? Do not take this medication with any of the following: Live virus vaccines This medication may also interact with the following: Medications that treat or prevent blood clots, such as warfarin, enoxaparin, dalteparin This list may not describe all possible interactions. Give your health care provider a list of all the medicines, herbs, non-prescription drugs, or dietary supplements you use. Also tell them if you smoke, drink alcohol, or use illegal drugs. Some items may interact with your medicine. What should I watch for while using this medication? Your condition will be monitored carefully while you are receiving this medication. This medication may make you feel generally unwell. This is not uncommon as chemotherapy can affect healthy cells as well as cancer cells. Report any side effects. Continue your course of treatment even though you feel ill unless your care team tells you to stop. In some cases, you may be given additional medications to help with side effects. Follow all directions for their use. This medication may increase your risk of getting an infection. Call your care team for advice if you get a fever, chills, sore throat, or other symptoms of   a cold or flu. Do not treat yourself. Try to avoid being around people who are sick. This medication may increase your risk to bruise or bleed. Call your care team if you notice any unusual bleeding. Be careful brushing or flossing your teeth or using a  toothpick because you may get an infection or bleed more easily. If you have any dental work done, tell your dentist you are receiving this medication. Avoid taking medications that contain aspirin, acetaminophen, ibuprofen, naproxen, or ketoprofen unless instructed by your care team. These medications may hide a fever. Do not treat diarrhea with over the counter products. Contact your care team if you have diarrhea that lasts more than 2 days or if it is severe and watery. This medication can make you more sensitive to the sun. Keep out of the sun. If you cannot avoid being in the sun, wear protective clothing and sunscreen. Do not use sun lamps, tanning beds, or tanning booths. Talk to your care team if you or your partner wish to become pregnant or think you might be pregnant. This medication can cause serious birth defects if taken during pregnancy and for 3 months after the last dose. A reliable form of contraception is recommended while taking this medication and for 3 months after the last dose. Talk to your care team about effective forms of contraception. Do not father a child while taking this medication and for 3 months after the last dose. Use a condom while having sex during this time period. Do not breastfeed while taking this medication. This medication may cause infertility. Talk to your care team if you are concerned about your fertility. What side effects may I notice from receiving this medication? Side effects that you should report to your care team as soon as possible: Allergic reactions--skin rash, itching, hives, swelling of the face, lips, tongue, or throat Heart attack--pain or tightness in the chest, shoulders, arms, or jaw, nausea, shortness of breath, cold or clammy skin, feeling faint or lightheaded Heart failure--shortness of breath, swelling of the ankles, feet, or hands, sudden weight gain, unusual weakness or fatigue Heart rhythm changes--fast or irregular heartbeat,  dizziness, feeling faint or lightheaded, chest pain, trouble breathing High ammonia level--unusual weakness or fatigue, confusion, loss of appetite, nausea, vomiting, seizures Infection--fever, chills, cough, sore throat, wounds that don't heal, pain or trouble when passing urine, general feeling of discomfort or being unwell Low red blood cell level--unusual weakness or fatigue, dizziness, headache, trouble breathing Pain, tingling, or numbness in the hands or feet, muscle weakness, change in vision, confusion or trouble speaking, loss of balance or coordination, trouble walking, seizures Redness, swelling, and blistering of the skin over hands and feet Severe or prolonged diarrhea Unusual bruising or bleeding Side effects that usually do not require medical attention (report to your care team if they continue or are bothersome): Dry skin Headache Increased tears Nausea Pain, redness, or swelling with sores inside the mouth or throat Sensitivity to light Vomiting This list may not describe all possible side effects. Call your doctor for medical advice about side effects. You may report side effects to FDA at 1-800-FDA-1088. Where should I keep my medication? This medication is given in a hospital or clinic. It will not be stored at home. NOTE: This sheet is a summary. It may not cover all possible information. If you have questions about this medicine, talk to your doctor, pharmacist, or health care provider.  2023 Elsevier/Gold Standard (2021-06-08 00:00:00)         To help prevent nausea and vomiting after your treatment, we encourage you to take your nausea medication as directed.  BELOW ARE SYMPTOMS THAT SHOULD BE REPORTED IMMEDIATELY: *FEVER GREATER THAN 100.4 F (38 C) OR HIGHER *CHILLS OR SWEATING *NAUSEA AND VOMITING THAT IS NOT CONTROLLED WITH YOUR NAUSEA MEDICATION *UNUSUAL SHORTNESS OF BREATH *UNUSUAL BRUISING OR BLEEDING *URINARY PROBLEMS (pain or burning when urinating, or  frequent urination) *BOWEL PROBLEMS (unusual diarrhea, constipation, pain near the anus) TENDERNESS IN MOUTH AND THROAT WITH OR WITHOUT PRESENCE OF ULCERS (sore throat, sores in mouth, or a toothache) UNUSUAL RASH, SWELLING OR PAIN  UNUSUAL VAGINAL DISCHARGE OR ITCHING   Items with * indicate a potential emergency and should be followed up as soon as possible or go to the Emergency Department if any problems should occur.  Please show the CHEMOTHERAPY ALERT CARD or IMMUNOTHERAPY ALERT CARD at check-in to the Emergency Department and triage nurse.  Should you have questions after your visit or need to cancel or reschedule your appointment, please contact [facility] [phone]  and follow the prompts.  Office hours are 8:00 a.m. to 4:30 p.m. Monday - Friday. Please note that voicemails left after 4:00 p.m. may not be returned until the following business day.  We are closed weekends and major holidays. You have access to a nurse at all times for urgent questions. Please call the main number to the clinic [phone] and follow the prompts.  For any non-urgent questions, you may also contact your provider using MyChart. We now offer e-Visits for anyone 18 and older to request care online for non-urgent symptoms. For details visit [email or web address].   Also download the MyChart app! Go to the app store, search "MyChart", open the app, select [facility], and log in with your MyChart username and password.   

## 2022-03-14 NOTE — Progress Notes (Signed)
Remote pacemaker transmission.   

## 2022-03-15 ENCOUNTER — Other Ambulatory Visit: Payer: Self-pay

## 2022-03-23 ENCOUNTER — Encounter: Payer: Self-pay | Admitting: Hematology

## 2022-03-23 ENCOUNTER — Inpatient Hospital Stay: Payer: Medicare Other

## 2022-03-23 ENCOUNTER — Inpatient Hospital Stay: Payer: Medicare Other | Admitting: Hematology

## 2022-03-23 VITALS — BP 144/86 | HR 72 | Temp 97.4°F | Resp 17

## 2022-03-23 DIAGNOSIS — Z95828 Presence of other vascular implants and grafts: Secondary | ICD-10-CM

## 2022-03-23 DIAGNOSIS — C183 Malignant neoplasm of hepatic flexure: Secondary | ICD-10-CM

## 2022-03-23 DIAGNOSIS — Z5111 Encounter for antineoplastic chemotherapy: Secondary | ICD-10-CM | POA: Diagnosis not present

## 2022-03-23 DIAGNOSIS — D631 Anemia in chronic kidney disease: Secondary | ICD-10-CM | POA: Diagnosis not present

## 2022-03-23 DIAGNOSIS — N189 Chronic kidney disease, unspecified: Secondary | ICD-10-CM | POA: Diagnosis not present

## 2022-03-23 DIAGNOSIS — R197 Diarrhea, unspecified: Secondary | ICD-10-CM | POA: Diagnosis not present

## 2022-03-23 DIAGNOSIS — Z452 Encounter for adjustment and management of vascular access device: Secondary | ICD-10-CM | POA: Diagnosis not present

## 2022-03-23 LAB — CBC WITH DIFFERENTIAL/PLATELET
Abs Immature Granulocytes: 0.02 10*3/uL (ref 0.00–0.07)
Basophils Absolute: 0 10*3/uL (ref 0.0–0.1)
Basophils Relative: 1 %
Eosinophils Absolute: 0.1 10*3/uL (ref 0.0–0.5)
Eosinophils Relative: 2 %
HCT: 35 % — ABNORMAL LOW (ref 39.0–52.0)
Hemoglobin: 11.5 g/dL — ABNORMAL LOW (ref 13.0–17.0)
Immature Granulocytes: 0 %
Lymphocytes Relative: 41 %
Lymphs Abs: 2.4 10*3/uL (ref 0.7–4.0)
MCH: 33.4 pg (ref 26.0–34.0)
MCHC: 32.9 g/dL (ref 30.0–36.0)
MCV: 101.7 fL — ABNORMAL HIGH (ref 80.0–100.0)
Monocytes Absolute: 1 10*3/uL (ref 0.1–1.0)
Monocytes Relative: 18 %
Neutro Abs: 2.1 10*3/uL (ref 1.7–7.7)
Neutrophils Relative %: 38 %
Platelets: 118 10*3/uL — ABNORMAL LOW (ref 150–400)
RBC: 3.44 MIL/uL — ABNORMAL LOW (ref 4.22–5.81)
RDW: 15.9 % — ABNORMAL HIGH (ref 11.5–15.5)
WBC: 5.6 10*3/uL (ref 4.0–10.5)
nRBC: 0 % (ref 0.0–0.2)

## 2022-03-23 LAB — COMPREHENSIVE METABOLIC PANEL
ALT: 9 U/L (ref 0–44)
AST: 18 U/L (ref 15–41)
Albumin: 3.6 g/dL (ref 3.5–5.0)
Alkaline Phosphatase: 126 U/L (ref 38–126)
Anion gap: 9 (ref 5–15)
BUN: 13 mg/dL (ref 8–23)
CO2: 18 mmol/L — ABNORMAL LOW (ref 22–32)
Calcium: 9.1 mg/dL (ref 8.9–10.3)
Chloride: 109 mmol/L (ref 98–111)
Creatinine, Ser: 1.75 mg/dL — ABNORMAL HIGH (ref 0.61–1.24)
GFR, Estimated: 41 mL/min — ABNORMAL LOW (ref >60)
Glucose, Bld: 99 mg/dL (ref 70–99)
Potassium: 4.2 mmol/L (ref 3.5–5.1)
Sodium: 136 mmol/L (ref 135–145)
Total Bilirubin: 0.6 mg/dL (ref 0.3–1.2)
Total Protein: 7.5 g/dL (ref 6.5–8.1)

## 2022-03-23 LAB — MAGNESIUM: Magnesium: 1.7 mg/dL (ref 1.7–2.4)

## 2022-03-23 MED ORDER — LEUCOVORIN CALCIUM INJECTION 350 MG
320.0000 mg/m2 | Freq: Once | INTRAVENOUS | Status: AC
  Administered 2022-03-23: 628 mg via INTRAVENOUS
  Filled 2022-03-23: qty 31.4

## 2022-03-23 MED ORDER — PALONOSETRON HCL INJECTION 0.25 MG/5ML
0.2500 mg | Freq: Once | INTRAVENOUS | Status: AC
  Administered 2022-03-23: 0.25 mg via INTRAVENOUS
  Filled 2022-03-23: qty 5

## 2022-03-23 MED ORDER — SODIUM CHLORIDE 0.9 % IV SOLN
1920.0000 mg/m2 | INTRAVENOUS | Status: DC
  Administered 2022-03-23: 3750 mg via INTRAVENOUS
  Filled 2022-03-23: qty 75

## 2022-03-23 MED ORDER — OXALIPLATIN CHEMO INJECTION 100 MG/20ML
68.0000 mg/m2 | Freq: Once | INTRAVENOUS | Status: AC
  Administered 2022-03-23: 135 mg via INTRAVENOUS
  Filled 2022-03-23: qty 27

## 2022-03-23 MED ORDER — HEPARIN SOD (PORK) LOCK FLUSH 100 UNIT/ML IV SOLN: 500.0000 [IU] | Freq: Once | INTRAVENOUS | Status: DC | PRN

## 2022-03-23 MED ORDER — DEXTROSE 5 % IV SOLN: Freq: Once | INTRAVENOUS | Status: AC

## 2022-03-23 MED ORDER — SODIUM CHLORIDE 0.9 % IV SOLN
10.0000 mg | Freq: Once | INTRAVENOUS | Status: AC
  Administered 2022-03-23: 10 mg via INTRAVENOUS
  Filled 2022-03-23: qty 10

## 2022-03-23 MED ORDER — FLUOROURACIL CHEMO INJECTION 2.5 GM/50ML
320.0000 mg/m2 | Freq: Once | INTRAVENOUS | Status: AC
  Administered 2022-03-23: 650 mg via INTRAVENOUS
  Filled 2022-03-23: qty 13

## 2022-03-23 MED ORDER — SODIUM CHLORIDE 0.9 % IV SOLN: Freq: Once | INTRAVENOUS | Status: AC

## 2022-03-23 MED ORDER — SODIUM CHLORIDE 0.9% FLUSH: 10.0000 mL | INTRAVENOUS | Status: DC | PRN

## 2022-03-23 MED ORDER — SODIUM CHLORIDE 0.9% FLUSH
10.0000 mL | Freq: Once | INTRAVENOUS | Status: AC
  Administered 2022-03-23: 10 mL via INTRAVENOUS

## 2022-03-23 NOTE — Patient Instructions (Signed)
[facility]  Discharge Instructions: Thank you for choosing [facility] to provide your oncology and hematology care.  If you have a lab appointment with the [facility], please come in thru the Main Entrance and check in at the main information desk.  Wear comfortable clothing and clothing appropriate for easy access to any Portacath or PICC line.   We strive to give you quality time with your provider. You may need to reschedule your appointment if you arrive late (15 or more minutes).  Arriving late affects you and other patients whose appointments are after yours.  Also, if you miss three or more appointments without notifying the office, you may be dismissed from the clinic at the provider's discretion.      For prescription refill requests, have your pharmacy contact our office and allow 72 hours for refills to be completed.    Today you received the following chemotherapy and/or immunotherapy agents Folfox with 5FU pump start      To help prevent nausea and vomiting after your treatment, we encourage you to take your nausea medication as directed.  BELOW ARE SYMPTOMS THAT SHOULD BE REPORTED IMMEDIATELY: *FEVER GREATER THAN 100.4 F (38 C) OR HIGHER *CHILLS OR SWEATING *NAUSEA AND VOMITING THAT IS NOT CONTROLLED WITH YOUR NAUSEA MEDICATION *UNUSUAL SHORTNESS OF BREATH *UNUSUAL BRUISING OR BLEEDING *URINARY PROBLEMS (pain or burning when urinating, or frequent urination) *BOWEL PROBLEMS (unusual diarrhea, constipation, pain near the anus) TENDERNESS IN MOUTH AND THROAT WITH OR WITHOUT PRESENCE OF ULCERS (sore throat, sores in mouth, or a toothache) UNUSUAL RASH, SWELLING OR PAIN  UNUSUAL VAGINAL DISCHARGE OR ITCHING   Items with * indicate a potential emergency and should be followed up as soon as possible or go to the Emergency Department if any problems should occur.  Please show the CHEMOTHERAPY ALERT CARD or IMMUNOTHERAPY ALERT CARD at  check-in to the Emergency Department and triage nurse.  Should you have questions after your visit or need to cancel or reschedule your appointment, please contact [facility] [phone]  and follow the prompts.  Office hours are 8:00 a.m. to 4:30 p.m. Monday - Friday. Please note that voicemails left after 4:00 p.m. may not be returned until the following business day.  We are closed weekends and major holidays. You have access to a nurse at all times for urgent questions. Please call the main number to the clinic [phone] and follow the prompts.  For any non-urgent questions, you may also contact your provider using MyChart. We now offer e-Visits for anyone 14 and older to request care online for non-urgent symptoms. For details visit [email or web address].   Also download the MyChart app! Go to the app store, search "MyChart", open the app, select [facility], and log in with your MyChart username and password.

## 2022-03-23 NOTE — Progress Notes (Signed)
Patient has been examined by Dr. Delton Coombes, and vital signs and labs have been reviewed. ANC, Creatinine (1.7), LFTs, hemoglobin, and platelets are within treatment parameters per M.D. - pt may proceed with treatment.  Primary RN and pharmacy notified.

## 2022-03-23 NOTE — Progress Notes (Signed)
Fernando [place] [address], [place] [address]   CLINIC:  Medical Oncology/Hematology  PCP:  Fernando Nose, FNP [address] [place] [address] [phone]   REASON FOR VISIT:  Follow-up for stage 3B right colon cancer  PRIOR THERAPY: Resection of splenic flexure mass on 04/23/2021 by Dr. Marcello Moores, right colectomy on 08/18/2021  NGS Results:  MSI-stable  CURRENT THERAPY: Adjuvant FOLFOX  CANCER STAGING:  Cancer Staging  Cancer of left colon [facility]) Staging form: Colon and Rectum, AJCC 8th Edition - Clinical stage from 05/11/2021: Stage IIA (cT3, cN0, cM0) - Unsigned  Primary cancer of hepatic flexure s/p robotic right colectomy 08/18/2021 Staging form: Colon and Rectum, AJCC 8th Edition - Clinical stage from 09/23/2021: Stage IIIB (cT4a, cN1a, cM0) - Unsigned   INTERVAL HISTORY:  Fernando Dean, a 72 y.o. male, seen for follow-up and toxicity assessment prior to cycle 12 of FOLFOX. He was last seen by me on 03/09/22.  Today, he states that he is doing well overall. His appetite level is at 25%. His energy level is at 75%. He continues to have diarrhea and numbness in his finger tips and toes intermittently. However, these symptoms are not worsening and seem to be manageable at this time. He denies any issues with fine motor movements with his hands. He denies any peripheral numbness at this time. He occasionally takes Imodium for his diarrhea but has not needed any for the last few days. He denies any mouth sores.   REVIEW OF SYSTEMS:  Review of Systems  Constitutional:  Positive for fatigue. Negative for chills and fever.  HENT:   Negative for lump/mass, mouth sores, nosebleeds, sore throat and trouble swallowing.   Respiratory:  Negative for cough and shortness of breath.   Cardiovascular:  Negative for chest pain, leg swelling and palpitations.  Gastrointestinal:  Positive for diarrhea. Negative for abdominal pain, constipation, nausea and  vomiting.  Genitourinary:  Negative for bladder incontinence, difficulty urinating, dysuria, frequency, hematuria and nocturia.   Musculoskeletal:  Negative for arthralgias, back pain, flank pain, myalgias and neck pain.  Skin:  Negative for itching and rash.  Neurological:  Positive for numbness. Negative for dizziness and headaches.  Hematological:  Does not bruise/bleed easily.  Psychiatric/Behavioral:  Negative for depression, sleep disturbance and suicidal ideas. The patient is not nervous/anxious.   All other systems reviewed and are negative.   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 - [employer] MRI 2272 dual chamber PPM   Tuberculosis    as a child, was treated   Past Surgical History:  Procedure Laterality Date   ABDOMINAL AORTIC ENDOVASCULAR STENT GRAFT Bilateral 02/26/2021   Procedure: ABDOMINAL AORTIC ENDOVASCULAR STENT GRAFT REPAIR;  Surgeon: [place] Heck, MD;  Location: [facility] OR;  Service: Vascular;  Laterality: Bilateral;   BIOPSY  03/12/2021   Procedure: BIOPSY;  Surgeon: [place] Quale, MD;  [facility];  Service: Gastroenterology;;   BIOPSY  06/25/2021   Procedure: BIOPSY;  Surgeon: Fernando Quale, MD;  [facility]: [facility];  Service: Gastroenterology;;  mass  COLONOSCOPY WITH PROPOFOL N/A 03/12/2021   Procedure: COLONOSCOPY WITH PROPOFOL;  Surgeon: [place] Quale, MD;  [facility]: [facility];  Service: Gastroenterology;  Laterality: N/A;  10:55   COLONOSCOPY WITH PROPOFOL N/A 06/25/2021   Procedure: COLONOSCOPY WITH PROPOFOL;  Surgeon: [place] Quale, MD;  Location: [facility];  Service: Gastroenterology;  Laterality: N/A;  235 ASA 2   HEMOSTASIS CLIP PLACEMENT  03/12/2021   Procedure: HEMOSTASIS CLIP PLACEMENT;  Surgeon: [place] Quale, MD;  [facility];  Service: Gastroenterology;;   HEMOSTASIS CLIP PLACEMENT  06/25/2021   Procedure: HEMOSTASIS CLIP PLACEMENT;  Surgeon: [place] Quale, MD;  [facility];  Service: Gastroenterology;;   HERNIA REPAIR Left 10/25/2006   IR IMAGING GUIDED PORT INSERTION  09/29/2021   PACEMAKER IMPLANT N/A 02/08/2021   Procedure: PACEMAKER IMPLANT;  Surgeon: [place] Sprang, MD;  Location: [facility];  Service: Cardiovascular;  Laterality: N/A;   POLYPECTOMY  03/12/2021   Procedure: POLYPECTOMY;  Surgeon: [place] Quale, MD;  [facility];  Service: Gastroenterology;;   POLYPECTOMY  06/25/2021   Procedure: POLYPECTOMY;  Surgeon: [place], Fernando Quince, MD;  [facility];  Service: Gastroenterology;;   Fern[place]  03/12/2021   Procedure: Fernando Dean;  Surgeon: [place], Fernando Quince, MD;  [facility];  Service: Gastroenterology;;    SOCIAL HISTORY:  Social History   Socioeconomic History   Marital status: Single    Spouse name: Not on file   Number of children: Not on file   Years of education: Not on file   Highest education level: Not on file  Occupational History   Not on file  Tobacco Use   Smoking status: Every Day    Packs/day: 0.75    Years: 50.00    Total pack years: 37.50    Types: Cigarettes    Passive exposure: Current   Smokeless tobacco: Never  Vaping Use   Vaping Use: Never used  Substance and Sexual Activity   Alcohol use: Yes    Alcohol/week: 2.0 - 3.0 standard drinks of alcohol    Types: 2 - 3 Shots of liquor per week    Comment: daily liquor moderatly   Drug use: Not on file    Comment: uses every other day   Sexual activity: Not Currently  Other Topics Concern   Not on file  Social  History Narrative   Not on file   Social Determinants of Health   Financial Resource Strain: High Risk (10/14/2021)   Overall Financial Resource Strain (CARDIA)    Difficulty of Paying Living Expenses: Hard  Food Insecurity: Not on file  Transportation Needs: Not on file  Physical Activity: Not on file  Stress: Not on file  Social Connections: Not on file  Intimate Partner Violence: Not on file    FAMILY HISTORY:  Family History  Problem Relation Age of Onset   Heart disease Father    Colon polyps Sister        less than 10 lifetime polyps   Colon cancer Maternal Grandmother        dx 90s    CURRENT MEDICATIONS:  Current Outpatient Medications  Medication Sig Dispense Refill   albuterol (VENTOLIN HFA) 108 (90 Base) MCG/ACT inhaler Inhale 2 puffs into the lungs every 6 (six) hours as needed for wheezing or shortness of breath. 8 g 2   amLODipine (NORVASC) 2.5 MG tablet TAKE 1 TABLET(2.5 MG) BY MOUTH DAILY 90 tablet 3  aspirin EC 81 MG EC tablet Take 1 tablet (81 mg total) by mouth daily at 6 (six) AM. Swallow whole. 30 tablet 11   bisacodyl (DULCOLAX) 5 MG EC tablet Take 5 mg by mouth daily as needed for moderate constipation.     carvedilol (COREG) 6.25 MG tablet Take 1 tablet (6.25 mg total) by mouth 2 (two) times daily with a meal. 60 tablet 3   famotidine (PEPCID) 20 MG tablet Take 20 mg by mouth daily as needed for heartburn or indigestion.     feeding supplement (ENSURE ENLIVE / ENSURE PLUS) LIQD Take 237 mLs by mouth 3 (three) times daily between meals. (Patient taking differently: Take 237 mLs by mouth 3 (three) times daily as needed (nutrition).) 237 mL 12   fluorouracil CALGB 42683 2,400 mg/m2 in sodium chloride 0.9 % 150 mL Inject 2,400 mg/m2 into the vein over 48 hr. Every 14 days     FLUOROURACIL IV Inject into the vein every 14 (fourteen) days.     LEUCOVORIN CALCIUM IV Inject into the vein every 14 (fourteen) days.     loperamide (IMODIUM A-D) 2 MG tablet Take 1  tablet (2 mg total) by mouth 4 (four) times daily as needed for diarrhea or loose stools.  0   nicotine (NICODERM CQ - DOSED IN MG/24 HOURS) 21 mg/24hr patch Place 21 mg onto the skin daily.     OXALIPLATIN IV Inject into the vein every 14 (fourteen) days.     prochlorperazine (COMPAZINE) 10 MG tablet      simvastatin (ZOCOR) 20 MG tablet Take 20 mg by mouth daily.     No current facility-administered medications for this visit.   Facility-Administered Medications Ordered in Other Visits  Medication Dose Route Frequency Provider Last Rate Last Admin   magnesium sulfate 2 GM/50ML IVPB            palonosetron (ALOXI) 0.25 MG/5ML injection            sodium chloride flush (NS) 0.9 % injection 10 mL  10 mL Intracatheter PRN Derek Jack, MD   10 mL at 11/18/21 1257    ALLERGIES:  Allergies  Allergen Reactions   Lisinopril Anaphylaxis   Naproxen Shortness Of Breath and Palpitations   Nsaids Shortness Of Breath and Palpitations    PHYSICAL EXAM:  Performance status (ECOG): 1 - Symptomatic but completely ambulatory  There were no vitals filed for this visit.   Wt Readings from Last 3 Encounters:  03/23/22 75.1 kg (165 lb 9.6 oz)  03/09/22 76 kg (167 lb 9.6 oz)  02/23/22 75.4 kg (166 lb 3.2 oz)   Physical Exam Vitals reviewed.  Constitutional:      Appearance: Normal appearance.  Cardiovascular:     Rate and Rhythm: Normal rate and regular rhythm.     Pulses: Normal pulses.     Heart sounds: Normal heart sounds.  Pulmonary:     Effort: Pulmonary effort is normal.     Breath sounds: Normal breath sounds.  Neurological:     General: No focal deficit present.     Mental Status: He is alert and oriented to person, place, and time.  Psychiatric:        Mood and Affect: Mood normal.        Behavior: Behavior normal.      LABORATORY DATA:  I have reviewed the labs as listed.     Latest Ref Rng & Units 03/23/2022    9:10 AM 03/09/2022    9:21  AM 02/23/2022   10:17 AM   CBC  WBC 4.0 - 10.5 K/uL 5.6  4.7  4.9   Hemoglobin 13.0 - 17.0 g/dL 11.5  11.3  11.4   Hematocrit 39.0 - 52.0 % 35.0  34.4  34.5   Platelets 150 - 400 K/uL 118  110  138       Latest Ref Rng & Units 03/23/2022    9:10 AM 03/09/2022    9:21 AM 02/23/2022   10:17 AM  CMP  Glucose 70 - 99 mg/dL 99  141  93   BUN 8 - 23 mg/dL '13  18  24   '$ Creatinine 0.61 - 1.24 mg/dL 1.75  1.85  1.81   Sodium 135 - 145 mmol/L 136  137  135   Potassium 3.5 - 5.1 mmol/L 4.2  4.0  4.7   Chloride 98 - 111 mmol/L 109  112  108   CO2 22 - 32 mmol/L '18  17  19   '$ Calcium 8.9 - 10.3 mg/dL 9.1  9.2  9.0   Total Protein 6.5 - 8.1 g/dL 7.5  7.3  7.4   Total Bilirubin 0.3 - 1.2 mg/dL 0.6  0.5  0.5   Alkaline Phos 38 - 126 U/L 126  111  125   AST 15 - 41 U/L '18  25  19   '$ ALT 0 - 44 U/L '9  12  10     '$ DIAGNOSTIC IMAGING:  I have independently reviewed the scans and discussed with the patient. No results found.   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 - Colonoscopy on 03/12/2021: Fungating partially obstructing large mass found at 40 cm proximal to the anus, circumferential with no bleeding present. - CEA on 03/12/2021: 8.3 - CT angio abdomen and pelvis on 04/08/2021: No evidence of metastatic lymphadenopathy or liver mets.  Stable 7 mm subpleural pulmonary nodule in the periphery of the right lower lobe. - CT angio CAP on 02/04/2021: No evidence of lung metastasis, abdominal metastasis. - Resection of splenic flexure mass on 04/23/2021 by Dr. Marcello Moores. - Pathology: Moderately differentiated adenocarcinoma, invades into subserosa, margins negative, 0/38 lymph nodes.  No LVI or perineural invasion.  PT3 pN0.  MSI-stable. - Guardant reveal (05/21/2021): CT DNA was detected. - PET scan on 06/10/2021 shows 2 cm hypermetabolic soft tissue density in the transverse colon.  No evidence of local or distant metastatic disease.    Social/family history: - He is a  retired [employer]. - Current active smoker, half pack per day for 50 years. - Mother had colon cancer at age 4.  Maternal grandmother had colon cancer.  3.  Stage III (K5LZ7) right colon adenocarcinoma: - Right colectomy on 08/10/2021 by Dr. Marcello Moores. - Pathology with grade 2 moderately differentiated colonic adenocarcinoma, tumor site ascending colon, margins negative, 1/22 lymph nodes involved, [id number].  MMR preserved.        -Cycle 1 of FOLFOX started on 10/05/2021   PLAN:  Stage III (T4AN1) right colon adenocarcinoma, MMR preserved: - He has cold sensitivity lasting for 10 to 14 days after last cycle. - Last CEA 6.4 on 02/23/2022. - Reviewed labs today which showed normal LFTs.  CBC was grossly normal with mild thrombocytopenia. - Proceed with cycle 12 today.  He will receive additional 500 mL normal saline because of his creatinine of 1.75.  RTC 1 month for follow-up.  I will plan to repeat CTAP and a CEA level  prior to next visit.  2.  Normocytic anemia: - Combination anemia from CKD, relative iron deficiency and myelosuppression. - Hemoglobin today is 11.5.  If there is no improvement, consider ferritin and iron panel.  3.  Chemotherapy-induced diarrhea: - Continue Imodium as needed.  It is helping.   Orders placed this encounter:  Orders Placed This Encounter  Procedures   CT Abdomen Pelvis W Contrast    I,Alexis Herring,acting as a scribe for Derek Jack, MD.,have documented all relevant documentation on the behalf of Derek Jack, MD,as directed by  Derek Jack, MD while in the presence of Derek Jack, MD.  I, Derek Jack MD, have reviewed the above documentation for accuracy and completeness, and I agree with the above.   Derek Jack, MD [place] [phone]

## 2022-03-23 NOTE — Patient Instructions (Signed)
[place] [facility] Discharge Instructions   You were seen and examined today by Dr. Delton Coombes.  He reviewed the results of your lab work which are normal/stable.   We will proceed with your last treatment today.  We will repeat a scan prior to your next visit.   Return as scheduled today.    Thank you for choosing [place] at [facility] to provide your oncology and hematology care.  To afford each patient quality time with our provider, please arrive at least 15 minutes before your scheduled appointment time.   If you have a lab appointment with the [facility] please come in thru the Main Entrance and check in at the main information desk.  You need to re-schedule your appointment should you arrive 10 or more minutes late.  We strive to give you quality time with our providers, and arriving late affects you and other patients whose appointments are after yours.  Also, if you no show three or more times for appointments you may be dismissed from the clinic at the providers discretion.     Again, thank you for choosing [facility].  Our hope is that these requests will decrease the amount of time that you wait before being seen by our physicians.       _____________________________________________________________  Should you have questions after your visit to [facility], please contact our office at [phone] and follow the prompts.  Our office hours are 8:00 a.m. and 4:30 p.m. Monday - Friday.  Please note that voicemails left after 4:00 p.m. may not be returned until the following business day.  We are closed weekends and major holidays.  You do have access to a nurse 24-7, just call the main number to the clinic [phone] and do not press any options, hold on the line and a nurse will answer the phone.    For prescription refill requests, have your pharmacy contact our office and allow 72 hours.     Due to Covid, you will need to wear a mask upon entering the hospital. If you do not have a mask, a mask will be given to you at the Main Entrance upon arrival. For doctor visits, patients may have 1 support person age 82 or older with them. For treatment visits, patients can not have anyone with them due to social distancing guidelines and our immunocompromised population.

## 2022-03-23 NOTE — Progress Notes (Unsigned)
Patient presents today for Folfox infusion with 5FU pump start.  Vital signs and labs reviewed by MD.  Message received from Anastasio Champion RN/Dr. Delton Coombes patient okay for treatment, patient will also receive normal saline 500 cc bolus over one hour.  Treatment given today per MD orders.  Stable during infusion without adverse affects.  Vital signs stable.  5FU pump connected and verified RUN on the screen with the patient.  No complaints at this time.  Discharge from clinic ambulatory in stable condition.  Alert and oriented X 3.  Follow up with [facility] as scheduled.

## 2022-03-24 ENCOUNTER — Encounter: Payer: Self-pay | Admitting: Hematology

## 2022-03-25 ENCOUNTER — Inpatient Hospital Stay: Payer: Medicare Other | Attending: Hematology

## 2022-03-25 VITALS — BP 140/78 | HR 84 | Temp 98.1°F | Resp 18

## 2022-03-25 DIAGNOSIS — Z452 Encounter for adjustment and management of vascular access device: Secondary | ICD-10-CM | POA: Diagnosis not present

## 2022-03-25 DIAGNOSIS — C183 Malignant neoplasm of hepatic flexure: Secondary | ICD-10-CM | POA: Insufficient documentation

## 2022-03-25 DIAGNOSIS — Z95828 Presence of other vascular implants and grafts: Secondary | ICD-10-CM

## 2022-03-25 MED ORDER — HEPARIN SOD (PORK) LOCK FLUSH 100 UNIT/ML IV SOLN
500.0000 [IU] | Freq: Once | INTRAVENOUS | Status: AC | PRN
  Administered 2022-03-25: 500 [IU]

## 2022-03-25 MED ORDER — SODIUM CHLORIDE 0.9% FLUSH
10.0000 mL | INTRAVENOUS | Status: DC | PRN
  Administered 2022-03-25: 10 mL

## 2022-03-25 NOTE — Patient Instructions (Signed)
[facility]  Discharge Instructions: Thank you for choosing [facility] to provide your oncology and hematology care.  If you have a lab appointment with the [facility], please come in thru the Main Entrance and check in at the main information desk.  Wear comfortable clothing and clothing appropriate for easy access to any Portacath or PICC line.   We strive to give you quality time with your provider. You may need to reschedule your appointment if you arrive late (15 or more minutes).  Arriving late affects you and other patients whose appointments are after yours.  Also, if you miss three or more appointments without notifying the office, you may be dismissed from the clinic at the provider's discretion.      For prescription refill requests, have your pharmacy contact our office and allow 72 hours for refills to be completed.    Today you received 5FU pump disconnection    BELOW ARE SYMPTOMS THAT SHOULD BE REPORTED IMMEDIATELY: *FEVER GREATER THAN 100.4 F (38 C) OR HIGHER *CHILLS OR SWEATING *NAUSEA AND VOMITING THAT IS NOT CONTROLLED WITH YOUR NAUSEA MEDICATION *UNUSUAL SHORTNESS OF BREATH *UNUSUAL BRUISING OR BLEEDING *URINARY PROBLEMS (pain or burning when urinating, or frequent urination) *BOWEL PROBLEMS (unusual diarrhea, constipation, pain near the anus) TENDERNESS IN MOUTH AND THROAT WITH OR WITHOUT PRESENCE OF ULCERS (sore throat, sores in mouth, or a toothache) UNUSUAL RASH, SWELLING OR PAIN  UNUSUAL VAGINAL DISCHARGE OR ITCHING   Items with * indicate a potential emergency and should be followed up as soon as possible or go to the Emergency Department if any problems should occur.  Please show the CHEMOTHERAPY ALERT CARD or IMMUNOTHERAPY ALERT CARD at check-in to the Emergency Department and triage nurse.  Should you have questions after your visit or need to cancel or reschedule your appointment, please contact [facility] [phone]  and follow the prompts.  Office hours are 8:00 a.m. to 4:30 p.m. Monday - Friday. Please note that voicemails left after 4:00 p.m. may not be returned until the following business day.  We are closed weekends and major holidays. You have access to a nurse at all times for urgent questions. Please call the main number to the clinic [phone] and follow the prompts.  For any non-urgent questions, you may also contact your provider using MyChart. We now offer e-Visits for anyone 39 and older to request care online for non-urgent symptoms. For details visit [email or web address].   Also download the MyChart app! Go to the app store, search "MyChart", open the app, select [facility], and log in with your MyChart username and password.

## 2022-03-25 NOTE — Progress Notes (Addendum)
Pt presents today for 5FU chemotherapy pump disconnection per provider's order. Vital signs stable and pt voiced no new complaints at this time. Port flushed easily without difficulty with 10 mL of normal saline and 5 mL of heparin. Good blood return noted and needle removed intact. No bruising or swelling noted at the site.  Discharged from clinic ambulatory in stable condition. Alert and oriented x 3. F/U with [facility] as scheduled.

## 2022-04-02 IMAGING — CT CT CTA ABD/PEL W/CM AND/OR W/O CM
2 of 10 series · 12 of 46 positions shown, 15 images · IV contrast (Omnipaque or Isovue)
Comparison: Prior CT scan 02/04/2021

CLINICAL DATA: Abdominal aortic aneurysm surveillance

EXAM:
CTA ABDOMEN AND PELVIS WITHOUT AND WITH CONTRAST
TECHNIQUE: Multidetector CT imaging of the abdomen and pelvis was performed
using the standard protocol during bolus administration of
intravenous contrast. Multiplanar reconstructed images and MIPs were
obtained and reviewed to evaluate the vascular anatomy.

[Series 5: cta post stent axial arterial · axial · arterial · 0.74mm/px · z∈[+801,+1188]mm · 10 of 151 slices shown, 13 images]
[im 11/151  soft-tissue]
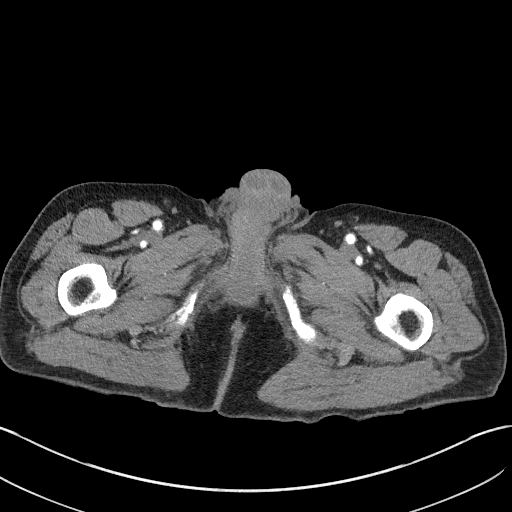
[im 11/151  bone]
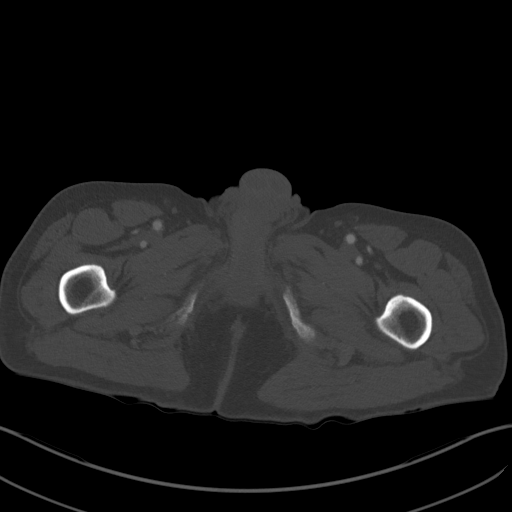
[im 33/151  soft-tissue]
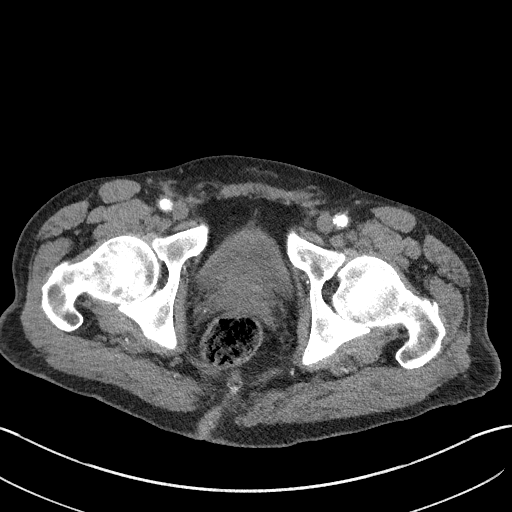
[im 54/151  soft-tissue]
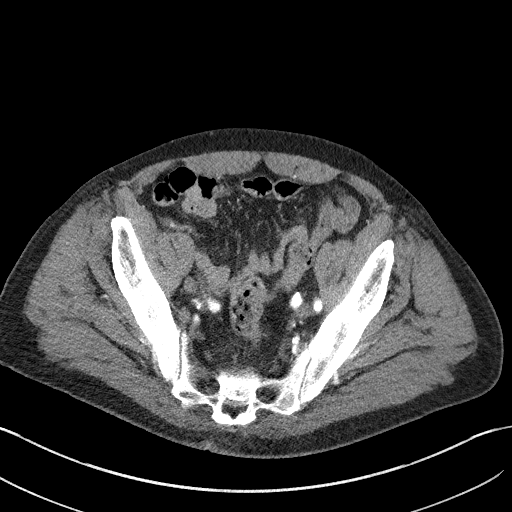
[im 65/151  soft-tissue]
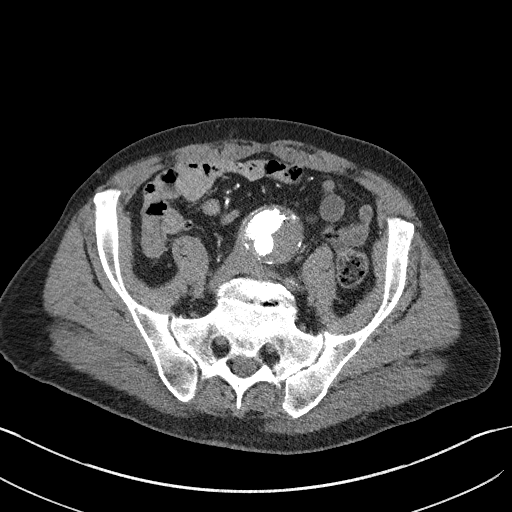
[im 86/151  soft-tissue]
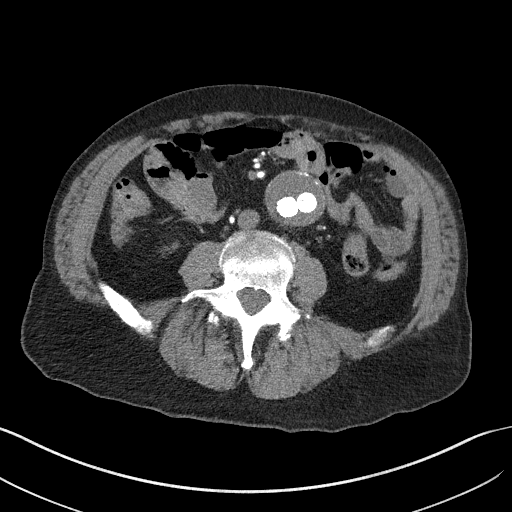
[im 97/151  soft-tissue]
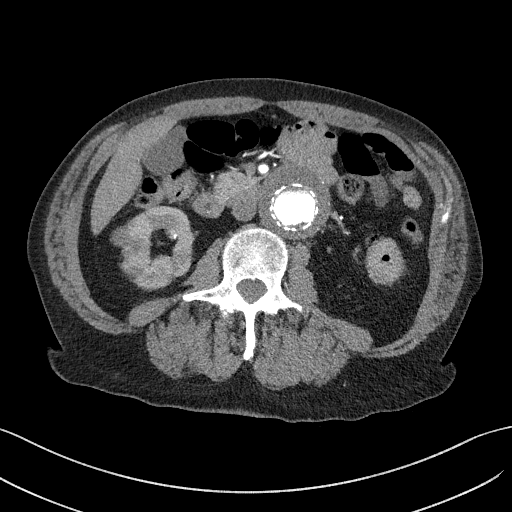
[im 108/151  lung]
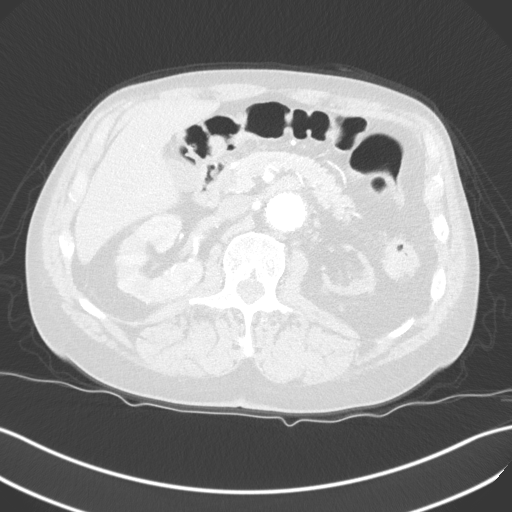
[im 118/151  soft-tissue]
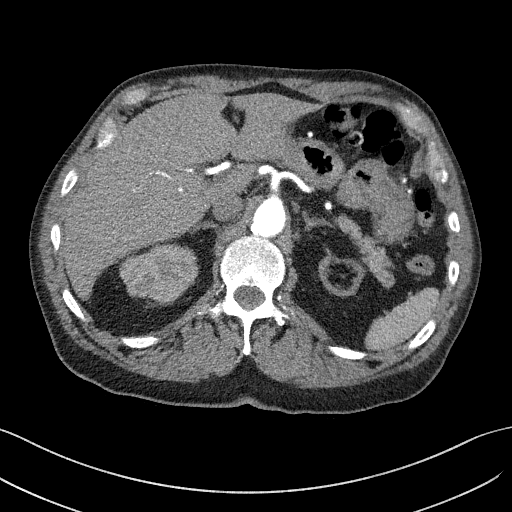
[im 118/151  lung]
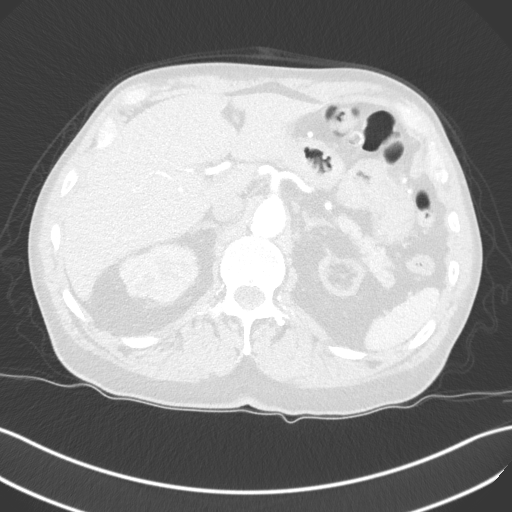
[im 129/151  lung]
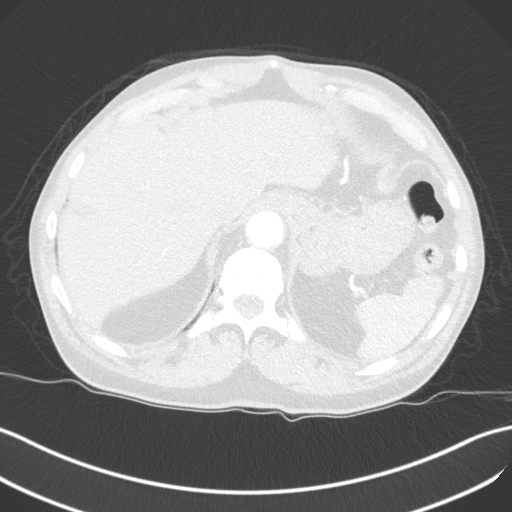
[im 140/151  soft-tissue]
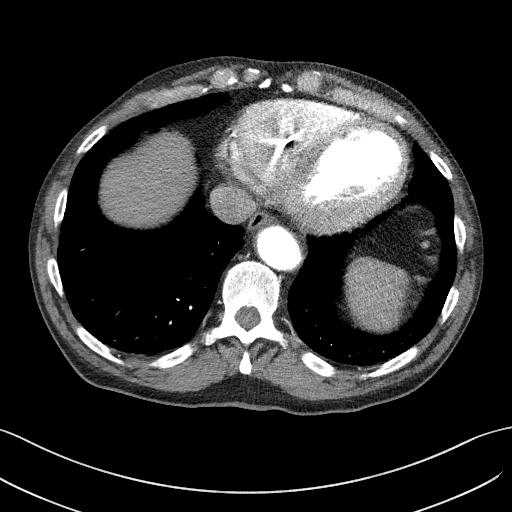
[im 140/151  lung]
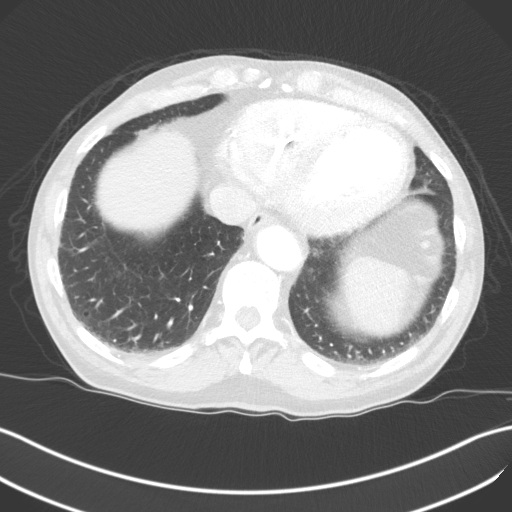

[Series 8: coronals · coronal · 0.76mm/px · 2 of 152 slices shown]
[im 51/152  soft-tissue]
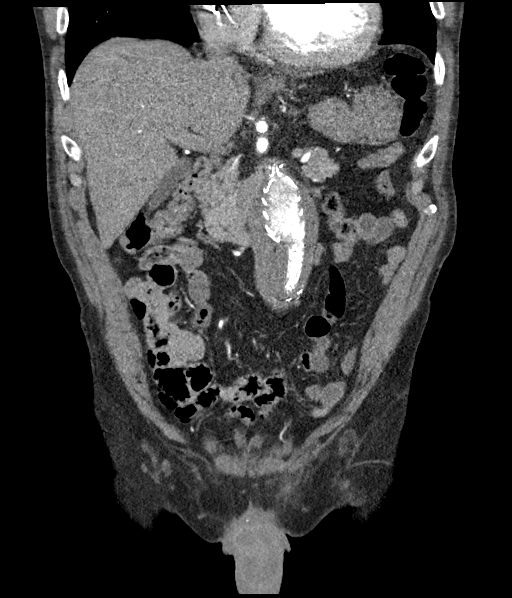
[im 101/152  soft-tissue]
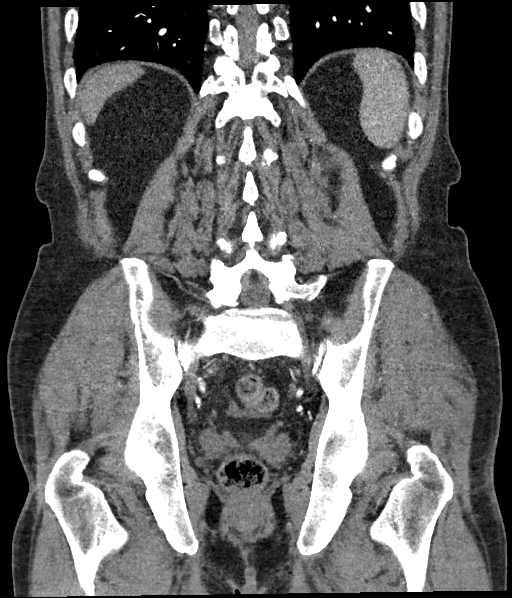

[12 of 46 positions shown; findings below may reference images not displayed]

RADIATION DOSE REDUCTION: This exam was performed according to the
departmental dose-optimization program which includes automated
exposure control, adjustment of the mA and/or kV according to
patient size and/or use of iterative reconstruction technique.

CONTRAST:  80mL OMNIPAQUE IOHEXOL 350 MG/ML SOLN
FINDINGS: VASCULAR

Aorta: Interval endovascular surgical changes of endovascular aortic
repair with a bifurcated endoprosthesis extending from just below
the renal arteries into the common iliac arteries bilaterally.
Maximal aortic diameter is approximately 5.6 x 5.5 cm similar to
slightly decreased compared to 5.8 x 5.4 cm previously. No evidence
of endoleak on the arterial or delayed phase images.

Celiac: Patent without evidence of aneurysm, dissection, vasculitis
or significant stenosis.

SMA: Patent without evidence of aneurysm, dissection, vasculitis or
significant stenosis.

Renals: Both renal arteries are patent without evidence of aneurysm,
dissection, vasculitis, fibromuscular dysplasia or significant
stenosis.

IMA: The IMA is occluded at the origin.

Inflow: The distal landing zones in the common iliac arteries are
normal in appearance. There is a high-grade stenosis at the origin
of the right internal iliac artery with mild poststenotic dilation
but no aneurysm. No evidence of iliac artery aneurysm or dissection.

Proximal Outflow: Bilateral common femoral and visualized portions
of the superficial and profunda femoral arteries are patent without
evidence of aneurysm, dissection, vasculitis or significant
stenosis.

Veins: No focal venous abnormality.

Review of the MIP images confirms the above findings.

NON-VASCULAR

Lower chest: Partially imaged cardiomegaly. Cardiac rhythm
maintenance device incompletely imaged. Mild combined centrilobular
and paraseptal stable 7 mm subpleural pulmonary nodule in the
periphery of the right lower lobe (image 8 series 7). No new or
suspicious appearing pulmonary mass or nodule. Mild dependent
atelectasis. Pulmonary emphysema

Hepatobiliary: No focal liver abnormality is seen. No gallstones,
gallbladder wall thickening, or biliary dilatation.

Pancreas: Unremarkable. No pancreatic ductal dilatation or
surrounding inflammatory changes.

Spleen: Normal in size without focal abnormality.

Adrenals/Urinary Tract: Normal appearance of the adrenal glands.
Chronically atrophied left kidney again noted. The right kidney is
lobular in appearance with focal areas of renal cortical scarring in
the upper pole. Similar appearance of multiple low-attenuation
cystic lesions of varying complexity. No evidence of enlargement or
definitive enhancement. The ureters and bladder are unremarkable.

Stomach/Bowel: No focal bowel wall thickening or evidence of
obstruction.

Lymphatic: No suspicious lymphadenopathy.

Reproductive: Normal appearance of the prostate gland.

Other: No abdominal wall hernia or abnormality. No abdominopelvic
ascites.

Musculoskeletal: No acute or significant osseous findings. L5-S1
degenerative disc disease.
IMPRESSION: VASCULAR

1. Successful interval endovascular aortic repair of infrarenal
abdominal aneurysm. Excluded aneurysm sac is slightly smaller at
x 5.5 cm compared to all 5.8 x 5.4 cm prior to treatment. No
evidence of endoleak or other complication.

NON-VASCULAR

1. Stable 7 mm subpleural pulmonary nodule in the periphery of the
right lower lobe. Recommend continued attention on routine follow-up
imaging.
2. Additional ancillary findings as above without significant
interval change.

Aortic aneurysm NOS (Y2QQ3-7FG.P); aortic Atherosclerosis
(Y2QQ3-7WF.F) and Emphysema (Y2QQ3-Q1J.K).

## 2022-04-21 ENCOUNTER — Ambulatory Visit (HOSPITAL_COMMUNITY)
Admission: RE | Admit: 2022-04-21 | Discharge: 2022-04-21 | Disposition: A | Discharge status: Home / Self Care | Payer: Medicare Other | Source: Ambulatory Visit | Attending: Hematology | Admitting: Hematology

## 2022-04-21 ENCOUNTER — Inpatient Hospital Stay: Payer: Medicare Other

## 2022-04-21 VITALS — BP 151/83 | HR 69 | Temp 98.0°F | Resp 18

## 2022-04-21 DIAGNOSIS — Z95828 Presence of other vascular implants and grafts: Secondary | ICD-10-CM

## 2022-04-21 DIAGNOSIS — Z452 Encounter for adjustment and management of vascular access device: Secondary | ICD-10-CM | POA: Diagnosis not present

## 2022-04-21 DIAGNOSIS — C183 Malignant neoplasm of hepatic flexure: Secondary | ICD-10-CM | POA: Diagnosis not present

## 2022-04-21 LAB — CBC WITH DIFFERENTIAL/PLATELET
Abs Immature Granulocytes: 0.03 10*3/uL (ref 0.00–0.07)
Basophils Absolute: 0.1 10*3/uL (ref 0.0–0.1)
Basophils Relative: 1 %
Eosinophils Absolute: 0.3 10*3/uL (ref 0.0–0.5)
Eosinophils Relative: 3 %
HCT: 36.7 % — ABNORMAL LOW (ref 39.0–52.0)
Hemoglobin: 12 g/dL — ABNORMAL LOW (ref 13.0–17.0)
Immature Granulocytes: 0 %
Lymphocytes Relative: 34 %
Lymphs Abs: 3 10*3/uL (ref 0.7–4.0)
MCH: 33.1 pg (ref 26.0–34.0)
MCHC: 32.7 g/dL (ref 30.0–36.0)
MCV: 101.4 fL — ABNORMAL HIGH (ref 80.0–100.0)
Monocytes Absolute: 0.8 10*3/uL (ref 0.1–1.0)
Monocytes Relative: 9 %
Neutro Abs: 4.5 10*3/uL (ref 1.7–7.7)
Neutrophils Relative %: 53 %
Platelets: 162 10*3/uL (ref 150–400)
RBC: 3.62 MIL/uL — ABNORMAL LOW (ref 4.22–5.81)
RDW: 15.3 % (ref 11.5–15.5)
WBC: 8.7 10*3/uL (ref 4.0–10.5)
nRBC: 0 % (ref 0.0–0.2)

## 2022-04-21 LAB — COMPREHENSIVE METABOLIC PANEL
ALT: 10 U/L (ref 0–44)
AST: 18 U/L (ref 15–41)
Albumin: 3.7 g/dL (ref 3.5–5.0)
Alkaline Phosphatase: 111 U/L (ref 38–126)
Anion gap: 8 (ref 5–15)
BUN: 25 mg/dL — ABNORMAL HIGH (ref 8–23)
CO2: 15 mmol/L — ABNORMAL LOW (ref 22–32)
Calcium: 9.2 mg/dL (ref 8.9–10.3)
Chloride: 110 mmol/L (ref 98–111)
Creatinine, Ser: 1.91 mg/dL — ABNORMAL HIGH (ref 0.61–1.24)
GFR, Estimated: 37 mL/min — ABNORMAL LOW (ref >60)
Glucose, Bld: 78 mg/dL (ref 70–99)
Potassium: 5.3 mmol/L — ABNORMAL HIGH (ref 3.5–5.1)
Sodium: 133 mmol/L — ABNORMAL LOW (ref 135–145)
Total Bilirubin: 0.7 mg/dL (ref 0.3–1.2)
Total Protein: 7.9 g/dL (ref 6.5–8.1)

## 2022-04-21 LAB — MAGNESIUM: Magnesium: 1.7 mg/dL (ref 1.7–2.4)

## 2022-04-21 MED ORDER — SODIUM CHLORIDE 0.9% FLUSH
10.0000 mL | Freq: Once | INTRAVENOUS | Status: AC
  Administered 2022-04-21: 10 mL via INTRAVENOUS

## 2022-04-21 MED ORDER — HEPARIN SOD (PORK) LOCK FLUSH 100 UNIT/ML IV SOLN
500.0000 [IU] | Freq: Once | INTRAVENOUS | Status: AC
  Administered 2022-04-21: 500 [IU] via INTRAVENOUS

## 2022-04-21 NOTE — Progress Notes (Signed)
Port flushed with good blood return noted. No bruising or swelling at site.Patient left accessed for CT scan and patient discharged in satisfactory condition. VVS stable with no signs or symptoms of distressed noted.

## 2022-04-23 LAB — CEA: CEA: 6.6 ng/mL — ABNORMAL HIGH (ref 0.0–4.7)

## 2022-04-24 NOTE — Progress Notes (Signed)
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 - Colonoscopy on 03/12/2021: Fungating partially obstructing large mass found at 40 cm proximal to the anus, circumferential with no bleeding present. - CEA on 03/12/2021: 8.3 - CT angio abdomen and pelvis on 04/08/2021: No evidence of metastatic lymphadenopathy or liver mets.  Stable 7 mm subpleural pulmonary nodule in the periphery of the right lower lobe. - CT angio CAP on 02/04/2021: No evidence of lung metastasis, abdominal metastasis. - Resection of splenic flexure mass on 04/23/2021 by Dr. Marcello Moores. - Pathology: Moderately differentiated adenocarcinoma, invades into subserosa, margins negative, 0/38 lymph nodes.  No LVI or perineural invasion.  PT3 pN0.  MSI-stable. - Guardant reveal (05/21/2021): CT DNA was detected. - PET scan on 06/10/2021 shows 2 cm hypermetabolic soft tissue density in the transverse colon.  No evidence of local or distant metastatic disease.    Social/family history: - He is a retired [employer]. - Current active smoker, half pack per day for 50 years. - Mother had colon cancer at age 27.  Maternal grandmother had colon cancer.  3.  Stage III [id number] right colon adenocarcinoma: - Right colectomy on 08/10/2021 by Dr. Marcello Moores. - Pathology with grade 2 moderately differentiated colonic adenocarcinoma, tumor site ascending colon, margins negative, 1/22 lymph nodes involved, [id number].  MMR preserved.        -Cycle 1 of FOLFOX started on  10/05/2021  Plan: Stage III (T4AN1) right colon adenocarcinoma, MMR preserved: - He has completed 12 cycles of FOLFOX in the adjuvant setting. - Reviewed labs from 04/21/2022.  LFTs are normal.  CEA 6.6.  CEA is staying between 5-7.  He smokes 1 pack/day of cigarettes which could be contributing to elevated CEA. - Reviewed CTAP from 04/25/2022: No evidence of recurrence or metastatic disease. - As his creatinine is elevated at 1.91 and potassium was 5.3 on recent labs, I have recommended low potassium diet.  I will make a referral to Dr. Theador Hawthorne of nephrology. - RTC 3 months for follow-up with repeat CEA and LFTs.  2.  Normocytic anemia: - Combination anemia from CKD, relative iron deficiency.  Hemoglobin is 12.  Will check ferritin and iron panel at next visit.    Orders Placed This Encounter  Procedures   CBC with Differential    Standing Status:   Future    Standing Expiration Date:   04/25/2023   Comprehensive metabolic panel    Standing Status:   Future    Standing Expiration Date:   04/25/2023   CEA    Standing Status:   Future    Standing Expiration Date:   04/25/2023   Iron and TIBC ([facility] ONLY)    Standing Status:   Future    Standing Expiration Date:   04/25/2023   Ferritin    Standing Status:   Future    Standing Expiration Date:   04/25/2023      I,Alexis Herring,acting as a scribe for [employer], MD.,have documented all relevant documentation on the behalf of Derek Jack, MD,as directed by  Derek Jack, MD while in the presence of Derek Jack, MD.   I, Derek Jack MD, have reviewed the above documentation for accuracy and completeness, and I agree with the above.   Derek Jack, MD   3/4/20246:13 PM  CHIEF COMPLAINT:   Diagnosis: stage 3B right colon cancer    Cancer Staging  Cancer of left colon [facility]) Staging form: Colon and Rectum, AJCC 8th Edition - Clinical stage from 05/11/2021: Stage IIA (cT3,  cN0, cM0) - Unsigned  Primary cancer of hepatic flexure s/p robotic right colectomy 08/18/2021 Staging form: Colon and Rectum, AJCC 8th Edition - Clinical stage from 09/23/2021: Stage IIIB (cT4a, cN1a, cM0) - Unsigned    Prior Therapy: Resection of splenic flexure mass on 04/23/2021 by Dr. Marcello Moores, right colectomy on 08/18/2021   Current Therapy:  Adjuvant FOLFOX   HISTORY OF PRESENT ILLNESS:   Oncology History  Primary cancer of hepatic flexure s/p robotic right colectomy 08/18/2021  08/18/2021 Initial Diagnosis   Primary cancer of hepatic flexure s/p robotic right colectomy 08/18/2021   10/05/2021 - 10/21/2021 Chemotherapy   Patient is on Treatment Plan : COLORECTAL FOLFOX q14d x 6 months     10/05/2021 -  Chemotherapy   Patient is on Treatment Plan : COLORECTAL FOLFOX q14d x 6 months        INTERVAL HISTORY:   Fernando Dean is a 72 y.o. male presenting to clinic today for follow up of stage 3B right colon cancer. He was last seen by me on 03/23/22.  Today, he states that he is doing well overall. His appetite level is at 75%. His energy level is at 75%.  Reports that his energy levels are coming back to normal.  Has chronic diarrhea since surgery which is stable.  He continues to smoke 1 pack of cigarettes daily.   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 - St Jude/[employer] MRI 2272 dual chamber PPM   Tuberculosis    as a child, was treated    Surgical History: Past Surgical History:  Procedure Laterality Date   ABDOMINAL AORTIC ENDOVASCULAR STENT GRAFT Bilateral 02/26/2021   Procedure: ABDOMINAL AORTIC ENDOVASCULAR STENT GRAFT REPAIR;   Surgeon: Marty Heck, MD;  [facility];  Service: Vascular;  Laterality: Bilateral;   BIOPSY  03/12/2021   Procedure: BIOPSY;  Surgeon: Harvel Quale, MD;  [facility];  Service: Gastroenterology;;   BIOPSY  06/25/2021   Procedure: BIOPSY;  Surgeon: Harvel Quale, MD;  [facility];  Service: Gastroenterology;;  mass    COLONOSCOPY WITH PROPOFOL N/A 03/12/2021   Procedure: COLONOSCOPY WITH PROPOFOL;  Surgeon: Harvel Quale, MD;  [facility];  Service: Gastroenterology;  Laterality: N/A;  10:55   COLONOSCOPY WITH PROPOFOL N/A 06/25/2021   Procedure: COLONOSCOPY WITH PROPOFOL;  Surgeon: Harvel Quale, MD;  [facility];  Service: Gastroenterology;  Laterality: N/A;  235 ASA 2   HEMOSTASIS CLIP PLACEMENT  03/12/2021   Procedure: HEMOSTASIS CLIP PLACEMENT;  Surgeon: Harvel Quale, MD;  [facility];  Service: Gastroenterology;;  HEMOSTASIS CLIP PLACEMENT  06/25/2021   Procedure: HEMOSTASIS CLIP PLACEMENT;  Surgeon: Harvel Quale, MD;  [facility];  Service: Gastroenterology;;   HERNIA REPAIR Left 10/25/2006   IR IMAGING GUIDED PORT INSERTION  09/29/2021   PACEMAKER IMPLANT N/A 02/08/2021   Procedure: PACEMAKER IMPLANT;  Surgeon: Deboraha Sprang, MD;  Location: [facility];  Service: Cardiovascular;  Laterality: N/A;   POLYPECTOMY  03/12/2021   Procedure: POLYPECTOMY;  Surgeon: Harvel Quale, MD;  [facility];  Service: Gastroenterology;;   POLYPECTOMY  06/25/2021   Procedure: POLYPECTOMY;  Surgeon: [name], [name], MD;  [facility];  Service: Gastroenterology;;   [name]  03/12/2021   Procedure: [name];  Surgeon: [name], [name], MD;  [facility];  Service: Gastroenterology;;    Social History: Social History   Socioeconomic History   Marital status: Single     Spouse name: Not on file   Number of children: Not on file   Years of education: Not on file   Highest education level: Not on file  Occupational History   Not on file  Tobacco Use   Smoking status: Every Day    Packs/day: 0.75    Years: 50.00    Total pack years: 37.50    Types: Cigarettes    Passive exposure: Current   Smokeless tobacco: Never  Vaping Use   Vaping Use: Never used  Substance and Sexual Activity   Alcohol use: Yes    Alcohol/week: 2.0 - 3.0 standard drinks of alcohol    Types: 2 - 3 Shots of liquor per week    Comment: daily liquor moderatly   Drug use: Not on file    Comment: uses every other day   Sexual activity: Not Currently  Other Topics Concern   Not on file  Social History Narrative   Not on file   Social Determinants of Health   Financial Resource Strain: High Risk (10/14/2021)   Overall Financial Resource Strain (CARDIA)    Difficulty of Paying Living Expenses: Hard  Food Insecurity: Not on file  Transportation Needs: Not on file  Physical Activity: Not on file  Stress: Not on file  Social Connections: Not on file  Intimate Partner Violence: Not on file    Family History: Family History  Problem Relation Age of Onset   Heart disease Father    Colon polyps Sister        less than 10 lifetime polyps   Colon cancer Maternal Grandmother        dx 90s    Current Medications:  Current Outpatient Medications:    albuterol (VENTOLIN HFA) 108 (90 Base) MCG/ACT inhaler, Inhale 2 puffs into the lungs every 6 (six) hours as needed for wheezing or shortness of breath., Disp: 8 g, Rfl: 2   amLODipine (NORVASC) 2.5 MG tablet, TAKE 1 TABLET(2.5 MG) BY MOUTH DAILY, Disp: 90 tablet, Rfl: 3   aspirin EC 81 MG EC tablet, Take 1 tablet (81 mg total) by mouth daily at 6 (six) AM. Swallow whole., Disp: 30 tablet, Rfl: 11   bisacodyl (DULCOLAX) 5 MG EC tablet, Take 5 mg by mouth daily as needed for moderate constipation., Disp: , Rfl:    carvedilol (COREG)  6.25 MG tablet, Take 1 tablet (6.25 mg total) by mouth 2 (two) times daily with a meal., Disp: 60 tablet, Rfl: 3   famotidine (PEPCID) 20 MG tablet, Take 20 mg by mouth daily as needed for heartburn  or indigestion., Disp: , Rfl:    feeding supplement (ENSURE ENLIVE / ENSURE PLUS) LIQD, Take 237 mLs by mouth 3 (three) times daily between meals. (Patient taking differently: Take 237 mLs by mouth 3 (three) times daily as needed (nutrition).), Disp: 237 mL, Rfl: 12   fluorouracil CALGB 60454 2,400 mg/m2 in sodium chloride 0.9 % 150 mL, Inject 2,400 mg/m2 into the vein over 48 hr. Every 14 days, Disp: , Rfl:    FLUOROURACIL IV, Inject into the vein every 14 (fourteen) days., Disp: , Rfl:    LEUCOVORIN CALCIUM IV, Inject into the vein every 14 (fourteen) days., Disp: , Rfl:    loperamide (IMODIUM A-D) 2 MG tablet, Take 1 tablet (2 mg total) by mouth 4 (four) times daily as needed for diarrhea or loose stools., Disp: , Rfl: 0   nicotine (NICODERM CQ - DOSED IN MG/24 HOURS) 21 mg/24hr patch, Place 21 mg onto the skin daily., Disp: , Rfl:    OXALIPLATIN IV, Inject into the vein every 14 (fourteen) days., Disp: , Rfl:    prochlorperazine (COMPAZINE) 10 MG tablet, , Disp: , Rfl:    simvastatin (ZOCOR) 20 MG tablet, Take 20 mg by mouth daily., Disp: , Rfl:  No current facility-administered medications for this visit.  Facility-Administered Medications Ordered in Other Visits:    magnesium sulfate 2 GM/50ML IVPB, , , ,    palonosetron (ALOXI) 0.25 MG/5ML injection, , , ,    sodium chloride flush (NS) 0.9 % injection 10 mL, 10 mL, Intracatheter, PRN, Derek Jack, MD, 10 mL at 11/18/21 1257   Allergies: Allergies  Allergen Reactions   Lisinopril Anaphylaxis   Naproxen Shortness Of Breath and Palpitations   Nsaids Shortness Of Breath and Palpitations    REVIEW OF SYSTEMS:   Review of Systems  Constitutional:  Negative for chills, fatigue and fever.  HENT:   Negative for lump/mass, mouth sores,  nosebleeds, sore throat and trouble swallowing.   Eyes:  Negative for eye problems.  Respiratory:  Negative for cough and shortness of breath.   Cardiovascular:  Negative for chest pain, leg swelling and palpitations.  Gastrointestinal:  Negative for abdominal pain, constipation, diarrhea, nausea and vomiting.  Genitourinary:  Negative for bladder incontinence, difficulty urinating, dysuria, frequency, hematuria and nocturia.   Musculoskeletal:  Negative for arthralgias, back pain, flank pain, myalgias and neck pain.  Skin:  Negative for itching and rash.  Neurological:  Negative for dizziness, headaches and numbness.  Hematological:  Does not bruise/bleed easily.  Psychiatric/Behavioral:  Negative for depression, sleep disturbance and suicidal ideas. The patient is not nervous/anxious.   All other systems reviewed and are negative.    VITALS:   Blood pressure (!) 142/78, pulse 72, temperature 97.8 F (36.6 C), temperature source Oral, resp. rate 18, weight 164 lb 14.5 oz (74.8 kg), SpO2 99 %.  Wt Readings from Last 3 Encounters:  04/25/22 164 lb 14.5 oz (74.8 kg)  03/23/22 165 lb 9.6 oz (75.1 kg)  03/09/22 167 lb 9.6 oz (76 kg)    Body mass index is 22.37 kg/m.  Performance status (ECOG): 1 - Symptomatic but completely ambulatory  PHYSICAL EXAM:   Physical Exam Vitals and nursing note reviewed. Exam conducted with a chaperone present.  Constitutional:      Appearance: Normal appearance.  Cardiovascular:     Rate and Rhythm: Normal rate and regular rhythm.     Pulses: Normal pulses.     Heart sounds: Normal heart sounds.  Pulmonary:  Effort: Pulmonary effort is normal.     Breath sounds: Normal breath sounds.  Abdominal:     Palpations: Abdomen is soft. There is no hepatomegaly, splenomegaly or mass.     Tenderness: There is no abdominal tenderness.  Musculoskeletal:     Right lower leg: No edema.     Left lower leg: No edema.  Lymphadenopathy:     Cervical: No  cervical adenopathy.     Right cervical: No superficial, deep or posterior cervical adenopathy.    Left cervical: No superficial, deep or posterior cervical adenopathy.     Upper Body:     Right upper body: No supraclavicular or axillary adenopathy.     Left upper body: No supraclavicular or axillary adenopathy.  Neurological:     General: No focal deficit present.     Mental Status: He is alert and oriented to person, place, and time.  Psychiatric:        Mood and Affect: Mood normal.        Behavior: Behavior normal.     LABS:      Latest Ref Rng & Units 04/21/2022   11:44 AM 03/23/2022    9:10 AM 03/09/2022    9:21 AM  CBC  WBC 4.0 - 10.5 K/uL 8.7  5.6  4.7   Hemoglobin 13.0 - 17.0 g/dL 12.0  11.5  11.3   Hematocrit 39.0 - 52.0 % 36.7  35.0  34.4   Platelets 150 - 400 K/uL 162  118  110       Latest Ref Rng & Units 04/21/2022   11:44 AM 03/23/2022    9:10 AM 03/09/2022    9:21 AM  CMP  Glucose 70 - 99 mg/dL 78  99  141   BUN 8 - 23 mg/dL '25  13  18   '$ Creatinine 0.61 - 1.24 mg/dL 1.91  1.75  1.85   Sodium 135 - 145 mmol/L 133  136  137   Potassium 3.5 - 5.1 mmol/L 5.3  4.2  4.0   Chloride 98 - 111 mmol/L 110  109  112   CO2 22 - 32 mmol/L '15  18  17   '$ Calcium 8.9 - 10.3 mg/dL 9.2  9.1  9.2   Total Protein 6.5 - 8.1 g/dL 7.9  7.5  7.3   Total Bilirubin 0.3 - 1.2 mg/dL 0.7  0.6  0.5   Alkaline Phos 38 - 126 U/L 111  126  111   AST 15 - 41 U/L '18  18  25   '$ ALT 0 - 44 U/L '10  9  12      '$ Lab Results  Component Value Date   CEA1 6.6 (H) 04/21/2022   /  CEA  Date Value Ref Range Status  04/21/2022 6.6 (H) 0.0 - 4.7 ng/mL Final    Comment:    (NOTE)                             Nonsmokers          <3.9                             Smokers             <5.6 [employer] Electrochemiluminescence Immunoassay (ECLIA) Values obtained with different assay methods or kits cannot be used interchangeably.  Results cannot be interpreted as absolute evidence of the  presence or absence of malignant disease. Performed At: [facility] [place], [place] [id number] Rush Farmer MD [id number]    No results found for: "PSA1" No results found for: "CAN199" No results found for: "CAN125"  No results found for: "TOTALPROTELP", "ALBUMINELP", "A1GS", "A2GS", "BETS", "BETA2SER", "GAMS", "MSPIKE", "SPEI" Lab Results  Component Value Date   TIBC 505 (H) 05/11/2021   FERRITIN 25 05/11/2021   IRONPCTSAT 7 (L) 05/11/2021   Lab Results  Component Value Date   LDH 141 05/11/2021     STUDIES:   CT Abdomen Pelvis W Contrast  Result Date: 04/25/2022 CLINICAL DATA:  Follow-up colon carcinoma. Assess treatment response. * Tracking Code: BO * EXAM: CT ABDOMEN AND PELVIS WITH CONTRAST TECHNIQUE: Multidetector CT imaging of the abdomen and pelvis was performed using the standard protocol following bolus administration of intravenous contrast. RADIATION DOSE REDUCTION: This exam was performed according to the departmental dose-optimization program which includes automated exposure control, adjustment of the mA and/or kV according to patient size and/or use of iterative reconstruction technique. CONTRAST:  67m OMNIPAQUE IOHEXOL 300 MG/ML  SOLN COMPARISON:  09/04/2021 and 04/08/2021 FINDINGS: Lower Chest: No acute findings. Hepatobiliary: 9 mm low-attenuation lesion in the posterior right hepatic lobe on image 19/2 is stable since prior study on 04/08/2021, consistent with benign etiology. No new or enlarging liver lesions identified. Gallbladder is unremarkable. No evidence of biliary ductal dilatation. Pancreas:  No mass or inflammatory changes. Spleen: Within normal limits in size and appearance. Adrenals/Urinary Tract: Diffuse left renal atrophy is again noted. Mild right renal parenchymal scarring is seen. Multiple small benign-appearing right renal cysts remain stable (no followup imaging is recommended). No suspicious masses identified. No  evidence of ureteral calculi or hydronephrosis. Stomach/Bowel: Stable postop changes from colon resection noted. No mass identified. No evidence of obstruction, inflammatory process or abnormal fluid collections. Vascular/Lymphatic: No pathologically enlarged lymph nodes. Prior stent graft repair of abdominal aortic aneurysm is again seen. Native aneurysm sac measures 5.3 cm, mildly decreased from 5.6 cm on prior study. Reproductive:  No mass or other significant abnormality. Other:  None. Musculoskeletal:  No suspicious bone lesions identified. IMPRESSION: No acute findings. No evidence of recurrent or metastatic carcinoma within the abdomen or pelvis. Electronically Signed   By: JMarlaine HindM.D.   On: 04/25/2022 09:48

## 2022-04-25 ENCOUNTER — Encounter (HOSPITAL_COMMUNITY): Payer: Self-pay | Admitting: Radiology

## 2022-04-25 ENCOUNTER — Inpatient Hospital Stay: Payer: Medicare Other | Attending: Hematology | Admitting: Hematology

## 2022-04-25 ENCOUNTER — Ambulatory Visit (HOSPITAL_COMMUNITY)
Admission: RE | Admit: 2022-04-25 | Discharge: 2022-04-25 | Disposition: A | Discharge status: Home / Self Care | Payer: Medicare Other | Source: Ambulatory Visit | Attending: Hematology | Admitting: Hematology

## 2022-04-25 VITALS — BP 142/78 | HR 72 | Temp 97.8°F | Resp 18 | Wt 164.9 lb

## 2022-04-25 DIAGNOSIS — D509 Iron deficiency anemia, unspecified: Secondary | ICD-10-CM | POA: Diagnosis not present

## 2022-04-25 DIAGNOSIS — C183 Malignant neoplasm of hepatic flexure: Secondary | ICD-10-CM | POA: Insufficient documentation

## 2022-04-25 DIAGNOSIS — D631 Anemia in chronic kidney disease: Secondary | ICD-10-CM | POA: Diagnosis not present

## 2022-04-25 DIAGNOSIS — N189 Chronic kidney disease, unspecified: Secondary | ICD-10-CM | POA: Diagnosis not present

## 2022-04-25 DIAGNOSIS — C189 Malignant neoplasm of colon, unspecified: Secondary | ICD-10-CM | POA: Diagnosis not present

## 2022-04-25 DIAGNOSIS — N281 Cyst of kidney, acquired: Secondary | ICD-10-CM | POA: Diagnosis not present

## 2022-04-25 MED ORDER — HEPARIN SOD (PORK) LOCK FLUSH 100 UNIT/ML IV SOLN
INTRAVENOUS | Status: AC
  Administered 2022-04-25: 500 [IU] via INTRAVENOUS
  Filled 2022-04-25: qty 5

## 2022-04-25 MED ORDER — IOHEXOL 300 MG/ML  SOLN
75.0000 mL | Freq: Once | INTRAMUSCULAR | Status: AC | PRN
  Administered 2022-04-25: 75 mL via INTRAVENOUS

## 2022-04-25 NOTE — Patient Instructions (Addendum)
[facility] Discharge Instructions   You were seen and examined today by Dr. Delton Coombes.  He reviewed the results of your CT scan which is normal. There is no evidence of cancer seen.   He reviewed the results of your lab work. Your CEA is still elevated at 6.6 but your CT scan is normal. This number could be elevated related to your smoking history. Your potassium was a little high at 5.3. Try to eat a diet low in potassium. Your kidney function is also chronically impaired. Dr. Raliegh Ip would like for you to see a kidney doctor to evaluate your for this. We will make a referral. All other lab work was normal/stable.   Return as scheduled.    Thank you for choosing [place] at [facility] to provide your oncology and hematology care.  To afford each patient quality time with our provider, please arrive at least 15 minutes before your scheduled appointment time.   If you have a lab appointment with the [facility] please come in thru the Main Entrance and check in at the main information desk.  You need to re-schedule your appointment should you arrive 10 or more minutes late.  We strive to give you quality time with our providers, and arriving late affects you and other patients whose appointments are after yours.  Also, if you no show three or more times for appointments you may be dismissed from the clinic at the providers discretion.     Again, thank you for choosing [facility].  Our hope is that these requests will decrease the amount of time that you wait before being seen by our physicians.       _____________________________________________________________  Should you have questions after your visit to [facility], please contact our office at [phone] and follow the prompts.  Our office hours are 8:00 a.m. and 4:30 p.m. Monday - Friday.  Please note that voicemails left after 4:00 p.m. may not be  returned until the following business day.  We are closed weekends and major holidays.  You do have access to a nurse 24-7, just call the main number to the clinic [phone] and do not press any options, hold on the line and a nurse will answer the phone.    For prescription refill requests, have your pharmacy contact our office and allow 72 hours.    Due to Covid, you will need to wear a mask upon entering the hospital. If you do not have a mask, a mask will be given to you at the Main Entrance upon arrival. For doctor visits, patients may have 1 support person age 79 or older with them. For treatment visits, patients can not have anyone with them due to social distancing guidelines and our immunocompromised population.

## 2022-05-09 ENCOUNTER — Ambulatory Visit (INDEPENDENT_AMBULATORY_CARE_PROVIDER_SITE_OTHER): Payer: Medicare Other

## 2022-05-09 DIAGNOSIS — I442 Atrioventricular block, complete: Secondary | ICD-10-CM

## 2022-05-09 LAB — CUP PACEART REMOTE DEVICE CHECK
Battery Remaining Longevity: 110 mo
Battery Remaining Percentage: 91 %
Battery Voltage: 3.01 V
Brady Statistic AP VP Percent: 8.2 %
Brady Statistic AP VS Percent: 1 %
Brady Statistic AS VP Percent: 91 %
Brady Statistic AS VS Percent: 1 %
Brady Statistic RA Percent Paced: 7.5 %
Brady Statistic RV Percent Paced: 99 %
Date Time Interrogation Session: 20240318020012
Implantable Lead Connection Status: 753985
Implantable Lead Connection Status: 753985
Implantable Lead Implant Date: 20221219
Implantable Lead Implant Date: 20221219
Implantable Lead Location: 753859
Implantable Lead Location: 753860
Implantable Pulse Generator Implant Date: 20221219
Lead Channel Impedance Value: 390 Ohm
Lead Channel Impedance Value: 430 Ohm
Lead Channel Pacing Threshold Amplitude: 0.375 V
Lead Channel Pacing Threshold Amplitude: 0.75 V
Lead Channel Pacing Threshold Pulse Width: 0.5 ms
Lead Channel Pacing Threshold Pulse Width: 0.5 ms
Lead Channel Sensing Intrinsic Amplitude: 1.4 mV
Lead Channel Sensing Intrinsic Amplitude: 10.6 mV
Lead Channel Setting Pacing Amplitude: 1 V
Lead Channel Setting Pacing Amplitude: 1.375
Lead Channel Setting Pacing Pulse Width: 0.5 ms
Lead Channel Setting Sensing Sensitivity: 2 mV
Pulse Gen Model: 2272
Pulse Gen Serial Number: 3985814

## 2022-06-08 DIAGNOSIS — N270 Small kidney, unilateral: Secondary | ICD-10-CM | POA: Diagnosis not present

## 2022-06-08 DIAGNOSIS — C186 Malignant neoplasm of descending colon: Secondary | ICD-10-CM | POA: Diagnosis not present

## 2022-06-08 DIAGNOSIS — C183 Malignant neoplasm of hepatic flexure: Secondary | ICD-10-CM | POA: Diagnosis not present

## 2022-06-08 DIAGNOSIS — E875 Hyperkalemia: Secondary | ICD-10-CM | POA: Diagnosis not present

## 2022-06-08 DIAGNOSIS — E8722 Chronic metabolic acidosis: Secondary | ICD-10-CM | POA: Diagnosis not present

## 2022-06-08 DIAGNOSIS — C185 Malignant neoplasm of splenic flexure: Secondary | ICD-10-CM | POA: Diagnosis not present

## 2022-06-08 DIAGNOSIS — C182 Malignant neoplasm of ascending colon: Secondary | ICD-10-CM | POA: Diagnosis not present

## 2022-06-08 DIAGNOSIS — N1832 Chronic kidney disease, stage 3b: Secondary | ICD-10-CM | POA: Diagnosis not present

## 2022-06-15 NOTE — Progress Notes (Signed)
Remote pacemaker transmission.   

## 2022-06-16 DIAGNOSIS — I10 Essential (primary) hypertension: Secondary | ICD-10-CM | POA: Diagnosis not present

## 2022-06-16 DIAGNOSIS — N1832 Chronic kidney disease, stage 3b: Secondary | ICD-10-CM | POA: Diagnosis not present

## 2022-06-16 DIAGNOSIS — C185 Malignant neoplasm of splenic flexure: Secondary | ICD-10-CM | POA: Diagnosis not present

## 2022-06-16 DIAGNOSIS — C186 Malignant neoplasm of descending colon: Secondary | ICD-10-CM | POA: Diagnosis not present

## 2022-06-16 DIAGNOSIS — E8722 Chronic metabolic acidosis: Secondary | ICD-10-CM | POA: Diagnosis not present

## 2022-06-16 DIAGNOSIS — E875 Hyperkalemia: Secondary | ICD-10-CM | POA: Diagnosis not present

## 2022-06-16 DIAGNOSIS — E785 Hyperlipidemia, unspecified: Secondary | ICD-10-CM | POA: Diagnosis not present

## 2022-06-22 DIAGNOSIS — N1831 Chronic kidney disease, stage 3a: Secondary | ICD-10-CM | POA: Diagnosis not present

## 2022-06-22 DIAGNOSIS — D509 Iron deficiency anemia, unspecified: Secondary | ICD-10-CM | POA: Diagnosis not present

## 2022-06-22 DIAGNOSIS — G629 Polyneuropathy, unspecified: Secondary | ICD-10-CM | POA: Diagnosis not present

## 2022-06-22 DIAGNOSIS — Z0001 Encounter for general adult medical examination with abnormal findings: Secondary | ICD-10-CM | POA: Diagnosis not present

## 2022-06-22 DIAGNOSIS — E785 Hyperlipidemia, unspecified: Secondary | ICD-10-CM | POA: Diagnosis not present

## 2022-06-22 DIAGNOSIS — F17200 Nicotine dependence, unspecified, uncomplicated: Secondary | ICD-10-CM | POA: Diagnosis not present

## 2022-06-22 DIAGNOSIS — I714 Abdominal aortic aneurysm, without rupture, unspecified: Secondary | ICD-10-CM | POA: Diagnosis not present

## 2022-06-22 DIAGNOSIS — C185 Malignant neoplasm of splenic flexure: Secondary | ICD-10-CM | POA: Diagnosis not present

## 2022-06-22 DIAGNOSIS — R7301 Impaired fasting glucose: Secondary | ICD-10-CM | POA: Diagnosis not present

## 2022-06-22 DIAGNOSIS — I10 Essential (primary) hypertension: Secondary | ICD-10-CM | POA: Diagnosis not present

## 2022-06-22 DIAGNOSIS — Z950 Presence of cardiac pacemaker: Secondary | ICD-10-CM | POA: Diagnosis not present

## 2022-06-22 DIAGNOSIS — I129 Hypertensive chronic kidney disease with stage 1 through stage 4 chronic kidney disease, or unspecified chronic kidney disease: Secondary | ICD-10-CM | POA: Diagnosis not present

## 2022-06-29 ENCOUNTER — Ambulatory Visit: Payer: Medicare Other | Attending: Internal Medicine | Admitting: Internal Medicine

## 2022-06-29 ENCOUNTER — Encounter: Payer: Self-pay | Admitting: Internal Medicine

## 2022-06-29 VITALS — BP 136/82 | HR 62 | Ht 72.0 in | Wt 142.0 lb

## 2022-06-29 DIAGNOSIS — I442 Atrioventricular block, complete: Secondary | ICD-10-CM | POA: Diagnosis not present

## 2022-06-29 LAB — CUP PACEART INCLINIC DEVICE CHECK
Battery Remaining Longevity: 109 mo
Battery Voltage: 3.01 V
Brady Statistic RA Percent Paced: 7.4 %
Brady Statistic RV Percent Paced: 99 %
Date Time Interrogation Session: 20240508103804
Implantable Lead Connection Status: 753985
Implantable Lead Connection Status: 753985
Implantable Lead Implant Date: 20221219
Implantable Lead Implant Date: 20221219
Implantable Lead Location: 753859
Implantable Lead Location: 753860
Implantable Pulse Generator Implant Date: 20221219
Lead Channel Impedance Value: 425 Ohm
Lead Channel Impedance Value: 487.5 Ohm
Lead Channel Pacing Threshold Amplitude: 0.375 V
Lead Channel Pacing Threshold Amplitude: 1 V
Lead Channel Pacing Threshold Pulse Width: 0.5 ms
Lead Channel Pacing Threshold Pulse Width: 0.5 ms
Lead Channel Sensing Intrinsic Amplitude: 11.2 mV
Lead Channel Sensing Intrinsic Amplitude: 3.1 mV
Lead Channel Setting Pacing Amplitude: 1.25 V
Lead Channel Setting Pacing Amplitude: 1.375
Lead Channel Setting Pacing Pulse Width: 0.5 ms
Lead Channel Setting Sensing Sensitivity: 2 mV
Pulse Gen Model: 2272
Pulse Gen Serial Number: 3985814

## 2022-06-29 NOTE — Patient Instructions (Signed)
Medication Instructions:  Your physician recommends that you continue on your current medications as directed. Please refer to the Current Medication list given to you today.  *If you need a refill on your cardiac medications before your next appointment, please call your pharmacy*   Lab Work: NONE   If you have labs (blood work) drawn today and your tests are completely normal, you will receive your results only by: MyChart Message (if you have MyChart) OR A paper copy in the mail If you have any lab test that is abnormal or we need to change your treatment, we will call you to review the results.   Testing/Procedures: NONE    Follow-Up: At [facility], you and your health needs are our priority.  As part of our continuing mission to provide you with exceptional heart care, we have created designated Provider Care Teams.  These Care Teams include your primary Cardiologist (physician) and Advanced Practice Providers (APPs -  Physician Assistants and Nurse Practitioners) who all work together to provide you with the care you need, when you need it.  We recommend signing up for the patient portal called "MyChart".  Sign up information is provided on this After Visit Summary.  MyChart is used to connect with patients for Virtual Visits (Telemedicine).  Patients are able to view lab/test results, encounter notes, upcoming appointments, etc.  Non-urgent messages can be sent to your provider as well.   To learn more about what you can do with MyChart, go to [email or web address].    Your next appointment:   1 year(s)  Provider:   Gregg Taylor, MD    Other Instructions Thank you for choosing  [facility]!    

## 2022-06-29 NOTE — Progress Notes (Signed)
HPI [name] returns today for followup. 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 He also has a h/o tobacco abuse and is smoking about 15-20 a day. He has not had syncope. His fatigue is much improved since his PPM was placed. He has been sedentary however.  Allergies  Allergen Reactions   Lisinopril Anaphylaxis   Naproxen Shortness Of Breath and Palpitations   Nsaids Shortness Of Breath and Palpitations     Current Outpatient Medications  Medication Sig Dispense Refill   albuterol (VENTOLIN HFA) 108 (90 Base) MCG/ACT inhaler Inhale 2 puffs into the lungs every 6 (six) hours as needed for wheezing or shortness of breath. 8 g 2   amLODipine (NORVASC) 2.5 MG tablet TAKE 1 TABLET(2.5 MG) BY MOUTH DAILY 90 tablet 3   aspirin EC 81 MG EC tablet Take 1 tablet (81 mg total) by mouth daily at 6 (six) AM. Swallow whole. 30 tablet 11   atorvastatin (LIPITOR) 10 MG tablet Take 10 mg by mouth daily.     bisacodyl (DULCOLAX) 5 MG EC tablet Take 5 mg by mouth daily as needed for moderate constipation.     carvedilol (COREG) 6.25 MG tablet Take 1 tablet (6.25 mg total) by mouth 2 (two) times daily with a meal. 60 tablet 3   famotidine (PEPCID) 20 MG tablet Take 20 mg by mouth daily as needed for heartburn or indigestion.     feeding supplement (ENSURE ENLIVE / ENSURE PLUS) LIQD Take 237 mLs by mouth 3 (three) times daily between meals. (Patient taking differently: Take 237 mLs by mouth 3 (three) times daily as needed (nutrition).) 237 mL 12   loperamide (IMODIUM A-D) 2 MG tablet Take 1 tablet (2 mg total) by mouth 4 (four) times daily as needed for diarrhea or loose stools.  0   prochlorperazine (COMPAZINE) 10 MG tablet      fluorouracil CALGB 16109 2,400 mg/m2 in sodium chloride 0.9 % 150 mL Inject 2,400 mg/m2 into the vein over 48 hr. Every 14 days (Patient not taking: Reported on 06/29/2022)     FLUOROURACIL IV Inject into the vein every 14 (fourteen) days. (Patient not  taking: Reported on 06/29/2022)     LEUCOVORIN CALCIUM IV Inject into the vein every 14 (fourteen) days. (Patient not taking: Reported on 06/29/2022)     nicotine (NICODERM CQ - DOSED IN MG/24 HOURS) 21 mg/24hr patch Place 21 mg onto the skin daily. (Patient not taking: Reported on 06/29/2022)     OXALIPLATIN IV Inject into the vein every 14 (fourteen) days. (Patient not taking: Reported on 06/29/2022)     No current facility-administered medications for this visit.   Facility-Administered Medications Ordered in Other Visits  Medication Dose Route Frequency Provider Last Rate Last Admin   magnesium sulfate 2 GM/50ML IVPB            palonosetron (ALOXI) 0.25 MG/5ML injection            sodium chloride flush (NS) 0.9 % injection 10 mL  10 mL Intracatheter PRN [name], MD   10 mL at 11/18/21 1257     Past Medical History:  Diagnosis Date   Alcohol use    Aortic regurgitation    moderate AR 02/05/21 echo   Chronic kidney disease    stage 3   Colonic mass    Family history of colon cancer 06/08/2021   Hypertension    Port-A-Cath in place 09/29/2021   Presence  of permanent cardiac pacemaker 02/08/2021   Inserted 02/08/21 for CHB - St Jude/[employer] MRI 2272 dual chamber PPM   Tuberculosis    as a child, was treated    ROS:   All systems reviewed and negative except as noted in the HPI.   Past Surgical History:  Procedure Laterality Date   ABDOMINAL AORTIC ENDOVASCULAR STENT GRAFT Bilateral 02/26/2021   Procedure: ABDOMINAL AORTIC ENDOVASCULAR STENT GRAFT REPAIR;  Surgeon: Cephus Shelling, MD;  Location: [facility];  Service: Vascular;  Laterality: Bilateral;   BIOPSY  03/12/2021   Procedure: BIOPSY;  Surgeon: Dolores Frame, MD;  [facility];  Service: Gastroenterology;;   BIOPSY  06/25/2021   Procedure: BIOPSY;  Surgeon: Dolores Frame, MD;  [facility]: [facility];  Service: Gastroenterology;;  mass    COLONOSCOPY WITH PROPOFOL N/A  03/12/2021   Procedure: COLONOSCOPY WITH PROPOFOL;  Surgeon: Dolores Frame, MD;  [facility]: [facility];  Service: Gastroenterology;  Laterality: N/A;  10:55   COLONOSCOPY WITH PROPOFOL N/A 06/25/2021   Procedure: COLONOSCOPY WITH PROPOFOL;  Surgeon: Dolores Frame, MD;  Location: [facility];  Service: Gastroenterology;  Laterality: N/A;  235 ASA 2   HEMOSTASIS CLIP PLACEMENT  03/12/2021   Procedure: HEMOSTASIS CLIP PLACEMENT;  Surgeon: Dolores Frame, MD;  [facility]: [facility];  Service: Gastroenterology;;   HEMOSTASIS CLIP PLACEMENT  06/25/2021   Procedure: HEMOSTASIS CLIP PLACEMENT;  Surgeon: Dolores Frame, MD;  [facility];  Service: Gastroenterology;;   HERNIA REPAIR Left 10/25/2006   IR IMAGING GUIDED PORT INSERTION  09/29/2021   PACEMAKER IMPLANT N/A 02/08/2021   Procedure: PACEMAKER IMPLANT;  Surgeon: Duke Salvia, MD;  [facility]: [facility] INV[facility] CV LAB;  Service: Cardiovascular;  Laterality: N/A;   POLYPECTOMY  03/12/2021   Procedure: POLYPECTOMY;  Surgeon: Dolores Frame, MD;  [facility];  Service: Gastroenterology;;   POLYPECTOMY  06/25/2021   Procedure: POLYPECTOMY;  Surgeon: Marguerita Merles, Reuel Boom, MD;  [facility]: [facility];  Service: Gastroenterology;;   Susa Day  03/12/2021   Procedure: Susa Day;  Surgeon: Marguerita Merles, Reuel Boom, MD;  [facility]: [facility];  Service: Gastroenterology;;     Family History  Problem Relation Age of Onset   Heart disease Father    Colon polyps Sister        less than 10 lifetime polyps   Colon cancer Maternal Grandmother        dx 90s     Social History   Socioeconomic History   Marital status: Single    Spouse name: Not on file   Number of children: Not on file   Years of education: Not on file   Highest education level: Not on file  Occupational History   Not on file  Tobacco Use   Smoking status: Every Day    Packs/day: 0.75     Years: 50.00    Additional pack years: 0.00    Total pack years: 37.50    Types: Cigarettes    Passive exposure: Current   Smokeless tobacco: Never  Vaping Use   Vaping Use: Never used  Substance and Sexual Activity   Alcohol use: Yes    Alcohol/week: 2.0 - 3.0 standard drinks of alcohol    Types: 2 - 3 Shots of liquor per week    Comment: daily liquor moderatly   Drug use: Not on file    Comment: uses every other day   Sexual activity: Not Currently  Other Topics Concern   Not on file  Social History Narrative   Not on file   Social Determinants of Health   Financial Resource Strain: High Risk (10/14/2021)   Overall Financial Resource Strain (CARDIA)    Difficulty of Paying Living Expenses: Hard  Food Insecurity: Not on file  Transportation Needs: Not on file  Physical Activity: Not on file  Stress: Not on file  Social Connections: Not on file  Intimate Partner Violence: Not on file     BP 136/82   Pulse 62   Ht 6' (1.829 m)   Wt 142 lb (64.4 kg)   SpO2 99%   BMI 19.26 kg/m   Physical Exam:  Well appearing 72 yo man, NAD HEENT: Unremarkable Neck:  No JVD, no thyromegally Lymphatics:  No adenopathy Back:  No CVA tenderness Lungs:  Clear with no wheezes HEART:  Regular rate rhythm, no murmurs, no rubs, no clicks Abd:  soft, positive bowel sounds, no organomegally, no rebound, no guarding Ext:  2 plus pulses, no edema, no cyanosis, no clubbing Skin:  No rashes no nodules Neuro:  CN II through XII intact, motor grossly intact  EKG - P synchronous ventricular pacing  DEVICE  Normal device function.  See PaceArt for details.   Assess/Plan:  Heart block  - he has CHB with a slow escape today. He is now asymptomatic. PPM -his [employer] Jude DDD PM is working normally. We will recheck in several months. HTN - his bp is well controlled. Tobacco abuse - I encouraged him to stop smoking.   Sharlot Gowda Areana Kosanke,MD

## 2022-07-06 DIAGNOSIS — R809 Proteinuria, unspecified: Secondary | ICD-10-CM | POA: Diagnosis not present

## 2022-07-06 DIAGNOSIS — E8722 Chronic metabolic acidosis: Secondary | ICD-10-CM | POA: Diagnosis not present

## 2022-07-06 DIAGNOSIS — I129 Hypertensive chronic kidney disease with stage 1 through stage 4 chronic kidney disease, or unspecified chronic kidney disease: Secondary | ICD-10-CM | POA: Diagnosis not present

## 2022-07-06 DIAGNOSIS — N1832 Chronic kidney disease, stage 3b: Secondary | ICD-10-CM | POA: Diagnosis not present

## 2022-07-19 ENCOUNTER — Encounter: Payer: Self-pay | Admitting: Hematology

## 2022-07-22 ENCOUNTER — Encounter (INDEPENDENT_AMBULATORY_CARE_PROVIDER_SITE_OTHER): Payer: Self-pay | Admitting: *Deleted

## 2022-07-29 ENCOUNTER — Other Ambulatory Visit: Payer: Self-pay

## 2022-07-29 DIAGNOSIS — C183 Malignant neoplasm of hepatic flexure: Secondary | ICD-10-CM

## 2022-08-01 ENCOUNTER — Inpatient Hospital Stay: Payer: Medicare Other | Attending: Hematology

## 2022-08-01 DIAGNOSIS — D631 Anemia in chronic kidney disease: Secondary | ICD-10-CM | POA: Diagnosis not present

## 2022-08-01 DIAGNOSIS — Z452 Encounter for adjustment and management of vascular access device: Secondary | ICD-10-CM | POA: Insufficient documentation

## 2022-08-01 DIAGNOSIS — D509 Iron deficiency anemia, unspecified: Secondary | ICD-10-CM

## 2022-08-01 DIAGNOSIS — Z08 Encounter for follow-up examination after completed treatment for malignant neoplasm: Secondary | ICD-10-CM | POA: Insufficient documentation

## 2022-08-01 DIAGNOSIS — N189 Chronic kidney disease, unspecified: Secondary | ICD-10-CM | POA: Insufficient documentation

## 2022-08-01 DIAGNOSIS — G620 Drug-induced polyneuropathy: Secondary | ICD-10-CM | POA: Insufficient documentation

## 2022-08-01 DIAGNOSIS — C183 Malignant neoplasm of hepatic flexure: Secondary | ICD-10-CM

## 2022-08-01 DIAGNOSIS — Z85038 Personal history of other malignant neoplasm of large intestine: Secondary | ICD-10-CM | POA: Insufficient documentation

## 2022-08-01 LAB — COMPREHENSIVE METABOLIC PANEL
ALT: 14 U/L (ref 0–44)
AST: 22 U/L (ref 15–41)
Albumin: 3.8 g/dL (ref 3.5–5.0)
Alkaline Phosphatase: 110 U/L (ref 38–126)
Anion gap: 7 (ref 5–15)
BUN: 26 mg/dL — ABNORMAL HIGH (ref 8–23)
CO2: 22 mmol/L (ref 22–32)
Calcium: 9.2 mg/dL (ref 8.9–10.3)
Chloride: 110 mmol/L (ref 98–111)
Creatinine, Ser: 2.1 mg/dL — ABNORMAL HIGH (ref 0.61–1.24)
GFR, Estimated: 33 mL/min — ABNORMAL LOW (ref >60)
Glucose, Bld: 101 mg/dL — ABNORMAL HIGH (ref 70–99)
Potassium: 4.5 mmol/L (ref 3.5–5.1)
Sodium: 139 mmol/L (ref 135–145)
Total Bilirubin: 0.4 mg/dL (ref 0.3–1.2)
Total Protein: 7.8 g/dL (ref 6.5–8.1)

## 2022-08-01 LAB — CBC WITH DIFFERENTIAL/PLATELET
Abs Immature Granulocytes: 0.01 10*3/uL (ref 0.00–0.07)
Basophils Absolute: 0 10*3/uL (ref 0.0–0.1)
Basophils Relative: 1 %
Eosinophils Absolute: 0.2 10*3/uL (ref 0.0–0.5)
Eosinophils Relative: 5 %
HCT: 39.9 % (ref 39.0–52.0)
Hemoglobin: 13.1 g/dL (ref 13.0–17.0)
Immature Granulocytes: 0 %
Lymphocytes Relative: 35 %
Lymphs Abs: 1.8 10*3/uL (ref 0.7–4.0)
MCH: 32.7 pg (ref 26.0–34.0)
MCHC: 32.8 g/dL (ref 30.0–36.0)
MCV: 99.5 fL (ref 80.0–100.0)
Monocytes Absolute: 0.7 10*3/uL (ref 0.1–1.0)
Monocytes Relative: 13 %
Neutro Abs: 2.4 10*3/uL (ref 1.7–7.7)
Neutrophils Relative %: 46 %
Platelets: 179 10*3/uL (ref 150–400)
RBC: 4.01 MIL/uL — ABNORMAL LOW (ref 4.22–5.81)
RDW: 13.6 % (ref 11.5–15.5)
WBC: 5.2 10*3/uL (ref 4.0–10.5)
nRBC: 0 % (ref 0.0–0.2)

## 2022-08-01 LAB — IRON AND TIBC
Iron: 56 ug/dL (ref 45–182)
Saturation Ratios: 16 % — ABNORMAL LOW (ref 17.9–39.5)
TIBC: 347 ug/dL (ref 250–450)
UIBC: 291 ug/dL

## 2022-08-01 LAB — MAGNESIUM: Magnesium: 1.6 mg/dL — ABNORMAL LOW (ref 1.7–2.4)

## 2022-08-01 LAB — FERRITIN: Ferritin: 130 ng/mL (ref 24–336)

## 2022-08-03 LAB — CEA: CEA: 4.9 ng/mL — ABNORMAL HIGH (ref 0.0–4.7)

## 2022-08-08 ENCOUNTER — Ambulatory Visit (INDEPENDENT_AMBULATORY_CARE_PROVIDER_SITE_OTHER): Payer: Medicare Other

## 2022-08-08 DIAGNOSIS — I442 Atrioventricular block, complete: Secondary | ICD-10-CM

## 2022-08-08 LAB — CUP PACEART REMOTE DEVICE CHECK
Battery Remaining Longevity: 107 mo
Battery Remaining Percentage: 89 %
Battery Voltage: 3.01 V
Brady Statistic AP VP Percent: 5.8 %
Brady Statistic AP VS Percent: 1 %
Brady Statistic AS VP Percent: 93 %
Brady Statistic AS VS Percent: 1 %
Brady Statistic RA Percent Paced: 5.1 %
Brady Statistic RV Percent Paced: 99 %
Date Time Interrogation Session: 20240617031426
Implantable Lead Connection Status: 753985
Implantable Lead Connection Status: 753985
Implantable Lead Implant Date: 20221219
Implantable Lead Implant Date: 20221219
Implantable Lead Location: 753859
Implantable Lead Location: 753860
Implantable Pulse Generator Implant Date: 20221219
Lead Channel Impedance Value: 410 Ohm
Lead Channel Impedance Value: 510 Ohm
Lead Channel Pacing Threshold Amplitude: 0.375 V
Lead Channel Pacing Threshold Amplitude: 1 V
Lead Channel Pacing Threshold Pulse Width: 0.5 ms
Lead Channel Pacing Threshold Pulse Width: 0.5 ms
Lead Channel Sensing Intrinsic Amplitude: 10.6 mV
Lead Channel Sensing Intrinsic Amplitude: 2.5 mV
Lead Channel Setting Pacing Amplitude: 1.25 V
Lead Channel Setting Pacing Amplitude: 1.375
Lead Channel Setting Pacing Pulse Width: 0.5 ms
Lead Channel Setting Sensing Sensitivity: 2 mV
Pulse Gen Model: 2272
Pulse Gen Serial Number: 3985814

## 2022-08-08 NOTE — Progress Notes (Signed)
[facility] [address], [place] [address]    Clinic Day:  08/09/2022  Referring physician: Leone Payor, FNP  Patient Care Team: Leone Payor, FNP as PCP - General (Family Medicine) [name], MD as PCP - Cardiology (Cardiology) [name], MD as Medical Oncologist (Medical Oncology) [name], RN as Oncology Nurse Navigator (Medical Oncology)   ASSESSMENT & PLAN:   Assessment: 1. 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 - Colonoscopy on 03/12/2021: Fungating partially obstructing large mass found at 40 cm proximal to the anus, circumferential with no bleeding present. - CEA on 03/12/2021: 8.3 - CT angio abdomen and pelvis on 04/08/2021: No evidence of metastatic lymphadenopathy or liver mets.  Stable 7 mm subpleural pulmonary nodule in the periphery of the right lower lobe. - CT angio CAP on 02/04/2021: No evidence of lung metastasis, abdominal metastasis. - Resection of splenic flexure mass on 04/23/2021 by Dr. Maisie Fus. - Pathology: Moderately differentiated adenocarcinoma, invades into subserosa, margins negative, 0/38 lymph nodes.  No LVI or perineural invasion.  PT3 pN0.  MSI-stable. - Guardant reveal (05/21/2021): CT DNA was detected. - PET scan on 06/10/2021 shows 2 cm hypermetabolic soft tissue density in the transverse colon.  No evidence of local or distant metastatic disease.   2. Social/family history: - He is a retired Journalist, newspaper. - Current active smoker, half pack per day for 50 years. - Mother had colon cancer at age 92.  Maternal grandmother had colon cancer.  3.  Stage III ([id number]) right colon adenocarcinoma: - Right colectomy on 08/10/2021 by Dr. Maisie Fus. - Pathology with grade 2 moderately differentiated colonic adenocarcinoma, tumor site ascending colon, margins negative, 1/22 lymph nodes involved, [id number].  MMR preserved.   - 12 cycles of FOLFOX from 10/05/2021  through 03/23/2022    Plan: 1. Stage III (T4AN1) right colon adenocarcinoma, MMR preserved: - Denies any change in bowel habits.  Denies any BRBPR/melena. - Reviewed labs: Normal LFTs.  CEA is 4.9 and stable. - Recommend follow-up in 3 months with repeat CEA and LFTs.  Will also do CTAP without contrast.  2.  Normocytic anemia: - Combination anemia from CKD and relative iron deficiency. - Labs on 08/01/2022: Hemoglobin normal at 13.1 and ferritin 130, percent saturation 16.  Will plan on repeating ferritin and iron panel at next visit.  3.  Peripheral neuropathy: - He has constant tingling in the fingertips and occasional tingling in the toes.  This is from oxaliplatin.  No neuropathic pains.    Orders Placed This Encounter  Procedures   CT Abdomen Pelvis Wo Contrast    Standing Status:   Future    Standing Expiration Date:   08/09/2023    Order Specific Question:   Preferred imaging location?    Answer:   [facility]    Order Specific Question:   Release to patient    Answer:   Immediate [1]    Order Specific Question:   If indicated for the ordered procedure, I authorize the administration of oral contrast media per Radiology protocol    Answer:   Yes    Order Specific Question:   Does the patient have a contrast media/X-ray dye allergy?    Answer:   No   CBC with Differential/Platelet    Standing Status:   Future    Standing Expiration Date:   08/09/2023    Order Specific Question:   Release to patient  Answer:   Immediate   Comprehensive metabolic panel    Standing Status:   Future    Standing Expiration Date:   08/09/2023    Order Specific Question:   Release to patient    Answer:   Immediate   CEA    Standing Status:   Future    Standing Expiration Date:   08/09/2023   Ferritin    Standing Status:   Future    Standing Expiration Date:   08/09/2023    Order Specific Question:   Release to patient    Answer:   Immediate   Iron and TIBC    Standing Status:    Future    Standing Expiration Date:   08/09/2023    Order Specific Question:   Release to patient    Answer:   Immediate      I,Katie Daubenspeck,acting as a scribe for [name], MD.,have documented all relevant documentation on the behalf of [name], MD,as directed by  [name], MD while in the presence of [name], MD.   I, [name] MD, have reviewed the above documentation for accuracy and completeness, and I agree with the above.   [name], MD   6/18/20244:34 PM  CHIEF COMPLAINT:   Diagnosis: stage 3B right colon cancer    Cancer Staging  Cancer of left colon [facility]) Staging form: Colon and Rectum, AJCC 8th Edition - Clinical stage from 05/11/2021: Stage IIA (cT3, cN0, cM0) - Unsigned  Primary cancer of hepatic flexure s/p robotic right colectomy 08/18/2021 Staging form: Colon and Rectum, AJCC 8th Edition - Clinical stage from 09/23/2021: Stage IIIB (cT4a, cN1a, cM0) - Unsigned    Prior Therapy: 1. Resection of splenic flexure mass on 04/23/2021 by Dr. Maisie Fus 2. right colectomy on 08/18/2021  3. Adjuvant FOLFOX, 12 cycles, 10/05/21 - 03/23/22  Current Therapy:  surveillance   HISTORY OF PRESENT ILLNESS:   Oncology History  Primary cancer of hepatic flexure s/p robotic right colectomy 08/18/2021  08/18/2021 Initial Diagnosis   Primary cancer of hepatic flexure s/p robotic right colectomy 08/18/2021   10/05/2021 - 10/21/2021 Chemotherapy   Patient is on Treatment Plan : COLORECTAL FOLFOX q14d x 6 months     10/05/2021 -  Chemotherapy   Patient is on Treatment Plan : COLORECTAL FOLFOX q14d x 6 months        INTERVAL HISTORY:   Fernando Dean is a 72 y.o. male presenting to clinic today for follow up of stage 3B right colon cancer. He was last seen by me on 04/25/22.  Today, he states that he is doing well overall. His appetite level is at 80%. His energy level is at 70%.  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 - St Jude/[employer] Assurity MRI 2272 dual chamber PPM   Tuberculosis    as a child, was treated    Surgical History: Past Surgical History:  Procedure Laterality Date   ABDOMINAL AORTIC ENDOVASCULAR STENT GRAFT Bilateral 02/26/2021   Procedure: ABDOMINAL AORTIC ENDOVASCULAR STENT GRAFT REPAIR;  Surgeon: Cephus Shelling, MD;  Location: [facility] OR;  Service:  Vascular;  Laterality: Bilateral;   BIOPSY  03/12/2021   Procedure: BIOPSY;  Surgeon: Dolores Frame, MD;  Location: [facility];  Service: Gastroenterology;;   BIOPSY  06/25/2021   Procedure: BIOPSY;  Surgeon: Dolores Frame, MD;  Location: [facility];  Service: Gastroenterology;;  mass    COLONOSCOPY WITH PROPOFOL N/A 03/12/2021   Procedure: COLONOSCOPY WITH PROPOFOL;  Surgeon: Dolores Frame, MD;  Location: [facility];  Service: Gastroenterology;  Laterality: N/A;  10:55   COLONOSCOPY WITH PROPOFOL N/A 06/25/2021   Procedure: COLONOSCOPY WITH PROPOFOL;  Surgeon: Dolores Frame, MD;  Location: [facility];  Service: Gastroenterology;  Laterality: N/A;  235 ASA 2   HEMOSTASIS CLIP PLACEMENT  03/12/2021   Procedure: HEMOSTASIS CLIP PLACEMENT;  Surgeon: Dolores Frame, MD;  [facility]: [facility];  Service: Gastroenterology;;   HEMOSTASIS CLIP PLACEMENT  06/25/2021   Procedure: HEMOSTASIS CLIP PLACEMENT;  Surgeon: Dolores Frame, MD;  [facility]: [facility];  Service: Gastroenterology;;   HERNIA REPAIR Left 10/25/2006   IR IMAGING GUIDED PORT INSERTION  09/29/2021   PACEMAKER IMPLANT N/A 02/08/2021   Procedure: PACEMAKER IMPLANT;   Surgeon: Duke Salvia, MD;  Location: [facility] INVASIVE CV LAB;  Service: Cardiovascular;  Laterality: N/A;   POLYPECTOMY  03/12/2021   Procedure: POLYPECTOMY;  Surgeon: Dolores Frame, MD;  [facility];  Service: Gastroenterology;;   POLYPECTOMY  06/25/2021   Procedure: POLYPECTOMY;  Surgeon: Marguerita Merles, Reuel Boom, MD;  Location: [facility];  Service: Gastroenterology;;   Susa Day  03/12/2021   Procedure: Susa Day;  Surgeon: Marguerita Merles, Reuel Boom, MD;  [facility]: [facility];  Service: Gastroenterology;;    Social History: Social History   Socioeconomic History   Marital status: Single    Spouse name: Not on file   Number of children: Not on file   Years of education: Not on file   Highest education level: Not on file  Occupational History   Not on file  Tobacco Use   Smoking status: Every Day    Packs/day: 0.75    Years: 50.00    Additional pack years: 0.00    Total pack years: 37.50    Types: Cigarettes    Passive exposure: Current   Smokeless tobacco: Never  Vaping Use   Vaping Use: Never used  Substance and Sexual Activity   Alcohol use: Yes    Alcohol/week: 2.0 - 3.0 standard drinks of alcohol    Types: 2 - 3 Shots of liquor per week    Comment: daily liquor moderatly   Drug use: Not on file    Comment: uses every other day   Sexual activity: Not Currently  Other Topics Concern   Not on file  Social History Narrative   Not on file   Social Determinants of Health   Financial Resource Strain: High Risk (10/14/2021)   Overall Financial Resource Strain (CARDIA)    Difficulty of Paying Living Expenses: Hard  Food Insecurity: Not on file  Transportation Needs: Not on file  Physical Activity: Not on file  Stress: Not on file  Social Connections: Not on file  Intimate Partner Violence: Not on file    Family History: Family History  Problem Relation Age of Onset   Heart disease Father    Colon polyps Sister        less  than 10 lifetime polyps   Colon cancer Maternal Grandmother        dx 90s  Current Medications:  Current Outpatient Medications:    albuterol (VENTOLIN HFA) 108 (90 Base) MCG/ACT inhaler, Inhale 2 puffs into the lungs every 6 (six) hours as needed for wheezing or shortness of breath., Disp: 8 g, Rfl: 2   amLODipine (NORVASC) 2.5 MG tablet, TAKE 1 TABLET(2.5 MG) BY MOUTH DAILY, Disp: 90 tablet, Rfl: 3   aspirin EC 81 MG EC tablet, Take 1 tablet (81 mg total) by mouth daily at 6 (six) AM. Swallow whole., Disp: 30 tablet, Rfl: 11   atorvastatin (LIPITOR) 10 MG tablet, Take 10 mg by mouth daily., Disp: , Rfl:    bisacodyl (DULCOLAX) 5 MG EC tablet, Take 5 mg by mouth daily as needed for moderate constipation., Disp: , Rfl:    carvedilol (COREG) 6.25 MG tablet, Take 1 tablet (6.25 mg total) by mouth 2 (two) times daily with a meal., Disp: 60 tablet, Rfl: 3   famotidine (PEPCID) 20 MG tablet, Take 20 mg by mouth daily as needed for heartburn or indigestion., Disp: , Rfl:    feeding supplement (ENSURE ENLIVE / ENSURE PLUS) LIQD, Take 237 mLs by mouth 3 (three) times daily between meals. (Patient taking differently: Take 237 mLs by mouth 3 (three) times daily as needed (nutrition).), Disp: 237 mL, Rfl: 12   fluorouracil CALGB 40981 2,400 mg/m2 in sodium chloride 0.9 % 150 mL, Inject 2,400 mg/m2 into the vein over 48 hr. Every 14 days, Disp: , Rfl:    FLUOROURACIL IV, Inject into the vein every 14 (fourteen) days., Disp: , Rfl:    LEUCOVORIN CALCIUM IV, Inject into the vein every 14 (fourteen) days., Disp: , Rfl:    loperamide (IMODIUM A-D) 2 MG tablet, Take 1 tablet (2 mg total) by mouth 4 (four) times daily as needed for diarrhea or loose stools., Disp: , Rfl: 0   nicotine (NICODERM CQ - DOSED IN MG/24 HOURS) 21 mg/24hr patch, Place 21 mg onto the skin daily., Disp: , Rfl:    OXALIPLATIN IV, Inject into the vein every 14 (fourteen) days., Disp: , Rfl:    prochlorperazine (COMPAZINE) 10 MG tablet, ,  Disp: , Rfl:    sodium bicarbonate 650 MG tablet, Take 650 mg by mouth 3 (three) times daily., Disp: , Rfl:    Vitamin D, Ergocalciferol, (DRISDOL) 1.25 MG (50000 UNIT) CAPS capsule, Take 50,000 Units by mouth once a week., Disp: , Rfl:  No current facility-administered medications for this visit.  Facility-Administered Medications Ordered in Other Visits:    magnesium sulfate 2 GM/50ML IVPB, , , ,    palonosetron (ALOXI) 0.25 MG/5ML injection, , , ,    sodium chloride flush (NS) 0.9 % injection 10 mL, 10 mL, Intracatheter, PRN, [name], MD, 10 mL at 11/18/21 1257   Allergies: Allergies  Allergen Reactions   Lisinopril Anaphylaxis   Naproxen Shortness Of Breath and Palpitations   Nsaids Shortness Of Breath and Palpitations    REVIEW OF SYSTEMS:   Review of Systems  Constitutional:  Negative for chills, fatigue and fever.  HENT:   Negative for lump/mass, mouth sores, nosebleeds, sore throat and trouble swallowing.   Eyes:  Negative for eye problems.  Respiratory:  Positive for cough and shortness of breath.   Cardiovascular:  Negative for chest pain, leg swelling and palpitations.  Gastrointestinal:  Negative for abdominal pain, constipation, diarrhea, nausea and vomiting.  Genitourinary:  Negative for bladder incontinence, difficulty urinating, dysuria, frequency, hematuria and nocturia.   Musculoskeletal:  Negative for arthralgias, back pain, flank pain, myalgias and  neck pain.  Skin:  Negative for itching and rash.  Neurological:  Positive for numbness. Negative for dizziness and headaches.  Hematological:  Does not bruise/bleed easily.  Psychiatric/Behavioral:  Negative for depression, sleep disturbance and suicidal ideas. The patient is not nervous/anxious.   All other systems reviewed and are negative.    VITALS:   Blood pressure 122/71, pulse 71, temperature 98.1 F (36.7 C), temperature source Oral, resp. rate 17, height 6' (1.829 m), weight 169 lb 6.4 oz  (76.8 kg), SpO2 100 %.  Wt Readings from Last 3 Encounters:  08/09/22 169 lb 6.4 oz (76.8 kg)  06/29/22 142 lb (64.4 kg)  04/25/22 164 lb 14.5 oz (74.8 kg)    Body mass index is 22.97 kg/m.  Performance status (ECOG): 1 - Symptomatic but completely ambulatory  PHYSICAL EXAM:   Physical Exam Vitals and nursing note reviewed. Exam conducted with a chaperone present.  Constitutional:      Appearance: Normal appearance.  Cardiovascular:     Rate and Rhythm: Normal rate and regular rhythm.     Pulses: Normal pulses.     Heart sounds: Normal heart sounds.  Pulmonary:     Effort: Pulmonary effort is normal.     Breath sounds: Normal breath sounds.  Abdominal:     Palpations: Abdomen is soft. There is no hepatomegaly, splenomegaly or mass.     Tenderness: There is no abdominal tenderness.  Musculoskeletal:     Right lower leg: No edema.     Left lower leg: No edema.  Lymphadenopathy:     Cervical: No cervical adenopathy.     Right cervical: No superficial, deep or posterior cervical adenopathy.    Left cervical: No superficial, deep or posterior cervical adenopathy.     Upper Body:     Right upper body: No supraclavicular or axillary adenopathy.     Left upper body: No supraclavicular or axillary adenopathy.  Neurological:     General: No focal deficit present.     Mental Status: He is alert and oriented to person, place, and time.  Psychiatric:        Mood and Affect: Mood normal.        Behavior: Behavior normal.     LABS:      Latest Ref Rng & Units 08/01/2022    1:45 PM 04/21/2022   11:44 AM 03/23/2022    9:10 AM  CBC  WBC 4.0 - 10.5 K/uL 5.2  8.7  5.6   Hemoglobin 13.0 - 17.0 g/dL 54.0  98.1  19.1   Hematocrit 39.0 - 52.0 % 39.9  36.7  35.0   Platelets 150 - 400 K/uL 179  162  118       Latest Ref Rng & Units 08/01/2022    1:45 PM 04/21/2022   11:44 AM 03/23/2022    9:10 AM  CMP  Glucose 70 - 99 mg/dL 478  78  99   BUN 8 - 23 mg/dL 26  25  13    Creatinine  0.61 - 1.24 mg/dL 2.95  6.21  3.08   Sodium 135 - 145 mmol/L 139  133  136   Potassium 3.5 - 5.1 mmol/L 4.5  5.3  4.2   Chloride 98 - 111 mmol/L 110  110  109   CO2 22 - 32 mmol/L 22  15  18    Calcium 8.9 - 10.3 mg/dL 9.2  9.2  9.1   Total Protein 6.5 - 8.1 g/dL 7.8  7.9  7.5  Total Bilirubin 0.3 - 1.2 mg/dL 0.4  0.7  0.6   Alkaline Phos 38 - 126 U/L 110  111  126   AST 15 - 41 U/L 22  18  18    ALT 0 - 44 U/L 14  10  9       Lab Results  Component Value Date   CEA1 4.9 (H) 08/01/2022   /  CEA  Date Value Ref Range Status  08/01/2022 4.9 (H) 0.0 - 4.7 ng/mL Final    Comment:    (NOTE)                             Nonsmokers          <3.9                             Smokers             <5.6 Roche [employer] Electrochemiluminescence Immunoassay (ECLIA) Values obtained with different assay methods or kits cannot be used interchangeably.  Results cannot be interpreted as absolute evidence of the presence or absence of malignant disease. Performed At: [facility] [address] [place], [place] [id number] Jolene Schimke MD WG:[id number]    No results found for: "PSA1" No results found for: "CAN199" No results found for: "CAN125"  No results found for: "TOTALPROTELP", "ALBUMINELP", "A1GS", "A2GS", "BETS", "BETA2SER", "GAMS", "MSPIKE", "SPEI" Lab Results  Component Value Date   TIBC 347 08/01/2022   TIBC 505 (H) 05/11/2021   FERRITIN 130 08/01/2022   FERRITIN 25 05/11/2021   IRONPCTSAT 16 (L) 08/01/2022   IRONPCTSAT 7 (L) 05/11/2021   Lab Results  Component Value Date   LDH 141 05/11/2021     STUDIES:   CUP PACEART REMOTE DEVICE CHECK  Result Date: 08/08/2022 Scheduled remote reviewed. Normal device function.  Next remote 91 days. LA, CVRS

## 2022-08-09 ENCOUNTER — Inpatient Hospital Stay: Payer: Medicare Other

## 2022-08-09 ENCOUNTER — Inpatient Hospital Stay: Payer: Medicare Other | Admitting: Hematology

## 2022-08-09 VITALS — BP 122/71 | HR 71 | Temp 98.1°F | Resp 17 | Ht 72.0 in | Wt 169.4 lb

## 2022-08-09 DIAGNOSIS — Z452 Encounter for adjustment and management of vascular access device: Secondary | ICD-10-CM | POA: Diagnosis not present

## 2022-08-09 DIAGNOSIS — N189 Chronic kidney disease, unspecified: Secondary | ICD-10-CM | POA: Diagnosis not present

## 2022-08-09 DIAGNOSIS — D631 Anemia in chronic kidney disease: Secondary | ICD-10-CM | POA: Diagnosis not present

## 2022-08-09 DIAGNOSIS — D509 Iron deficiency anemia, unspecified: Secondary | ICD-10-CM

## 2022-08-09 DIAGNOSIS — C183 Malignant neoplasm of hepatic flexure: Secondary | ICD-10-CM

## 2022-08-09 DIAGNOSIS — G620 Drug-induced polyneuropathy: Secondary | ICD-10-CM | POA: Diagnosis not present

## 2022-08-09 DIAGNOSIS — Z85038 Personal history of other malignant neoplasm of large intestine: Secondary | ICD-10-CM | POA: Diagnosis not present

## 2022-08-09 DIAGNOSIS — Z08 Encounter for follow-up examination after completed treatment for malignant neoplasm: Secondary | ICD-10-CM | POA: Diagnosis not present

## 2022-08-09 MED ORDER — SODIUM CHLORIDE FLUSH 0.9 % IV SOLN
10.0000 mL | Freq: Once | INTRAVENOUS | Status: AC
  Administered 2022-08-09: 10 mL via INTRAVENOUS
  Filled 2022-08-09: qty 10

## 2022-08-09 MED ORDER — HEPARIN SOD (PORK) LOCK FLUSH 100 UNIT/ML IV SOLN
500.0000 [IU] | Freq: Once | INTRAVENOUS | Status: AC
  Administered 2022-08-09: 500 [IU] via INTRAVENOUS

## 2022-08-09 NOTE — Progress Notes (Signed)
Patients port flushed without difficulty.  Good blood return noted with no bruising or swelling noted at site.  Band aid applied.  VSS with discharge and left in satisfactory condition with no s/s of distress noted.   

## 2022-08-09 NOTE — Patient Instructions (Addendum)
[facility]  Discharge Instructions  You were seen and examined today by Dr. Ellin Saba.  Dr. Ellin Saba discussed your most recent lab work which revealed that everything looks good and stable.  Dr. Ellin Saba is going to repeat scan and labs before your next appointment.  Follow-up as scheduled in 3 months.    Thank you for choosing [facility] to provide your oncology and hematology care.   To afford each patient quality time with our provider, please arrive at least 15 minutes before your scheduled appointment time. You may need to reschedule your appointment if you arrive late (10 or more minutes). Arriving late affects you and other patients whose appointments are after yours.  Also, if you miss three or more appointments without notifying the office, you may be dismissed from the clinic at the provider's discretion.    Again, thank you for choosing [facility].  Our hope is that these requests will decrease the amount of time that you wait before being seen by our physicians.   If you have a lab appointment with the Cancer Center - please note that after April 8th, all labs will be drawn in the cancer center.  You do not have to check in or register with the main entrance as you have in the past but will complete your check-in at the cancer center.            _____________________________________________________________  Should you have questions after your visit to [facility], please contact our office at [phone] and follow the prompts.  Our office hours are 8:00 a.m. to 4:30 p.m. Monday - Thursday and 8:00 a.m. to 2:30 p.m. Friday.  Please note that voicemails left after 4:00 p.m. may not be returned until the following business day.  We are closed weekends and all major holidays.  You do have access to a nurse 24-7, just call the main number to the clinic [phone] and do not press any  options, hold on the line and a nurse will answer the phone.    For prescription refill requests, have your pharmacy contact our office and allow 72 hours.    Masks are no longer required in the cancer centers. If you would like for your care team to wear a mask while they are taking care of you, please let them know. You may have one support person who is at least 72 years old accompany you for your appointments.

## 2022-08-26 NOTE — Progress Notes (Signed)
Remote pacemaker transmission.   

## 2022-09-22 DIAGNOSIS — R809 Proteinuria, unspecified: Secondary | ICD-10-CM | POA: Diagnosis not present

## 2022-09-22 DIAGNOSIS — N189 Chronic kidney disease, unspecified: Secondary | ICD-10-CM | POA: Diagnosis not present

## 2022-09-22 DIAGNOSIS — I129 Hypertensive chronic kidney disease with stage 1 through stage 4 chronic kidney disease, or unspecified chronic kidney disease: Secondary | ICD-10-CM | POA: Diagnosis not present

## 2022-09-22 DIAGNOSIS — E8722 Chronic metabolic acidosis: Secondary | ICD-10-CM | POA: Diagnosis not present

## 2022-09-22 DIAGNOSIS — N1832 Chronic kidney disease, stage 3b: Secondary | ICD-10-CM | POA: Diagnosis not present

## 2022-09-30 DIAGNOSIS — E8722 Chronic metabolic acidosis: Secondary | ICD-10-CM | POA: Diagnosis not present

## 2022-09-30 DIAGNOSIS — N2581 Secondary hyperparathyroidism of renal origin: Secondary | ICD-10-CM | POA: Diagnosis not present

## 2022-09-30 DIAGNOSIS — I129 Hypertensive chronic kidney disease with stage 1 through stage 4 chronic kidney disease, or unspecified chronic kidney disease: Secondary | ICD-10-CM | POA: Diagnosis not present

## 2022-09-30 DIAGNOSIS — N1832 Chronic kidney disease, stage 3b: Secondary | ICD-10-CM | POA: Diagnosis not present

## 2022-10-13 DIAGNOSIS — N189 Chronic kidney disease, unspecified: Secondary | ICD-10-CM | POA: Diagnosis not present

## 2022-10-18 ENCOUNTER — Telehealth: Payer: Self-pay | Admitting: Vascular Surgery

## 2022-10-18 DIAGNOSIS — E785 Hyperlipidemia, unspecified: Secondary | ICD-10-CM | POA: Diagnosis not present

## 2022-10-18 DIAGNOSIS — R7301 Impaired fasting glucose: Secondary | ICD-10-CM | POA: Diagnosis not present

## 2022-10-25 DIAGNOSIS — C185 Malignant neoplasm of splenic flexure: Secondary | ICD-10-CM | POA: Diagnosis not present

## 2022-10-25 DIAGNOSIS — Z950 Presence of cardiac pacemaker: Secondary | ICD-10-CM | POA: Diagnosis not present

## 2022-10-25 DIAGNOSIS — N1831 Chronic kidney disease, stage 3a: Secondary | ICD-10-CM | POA: Diagnosis not present

## 2022-10-25 DIAGNOSIS — C186 Malignant neoplasm of descending colon: Secondary | ICD-10-CM | POA: Diagnosis not present

## 2022-10-25 DIAGNOSIS — I10 Essential (primary) hypertension: Secondary | ICD-10-CM | POA: Diagnosis not present

## 2022-10-25 DIAGNOSIS — R7301 Impaired fasting glucose: Secondary | ICD-10-CM | POA: Diagnosis not present

## 2022-10-25 DIAGNOSIS — E875 Hyperkalemia: Secondary | ICD-10-CM | POA: Diagnosis not present

## 2022-10-25 DIAGNOSIS — I129 Hypertensive chronic kidney disease with stage 1 through stage 4 chronic kidney disease, or unspecified chronic kidney disease: Secondary | ICD-10-CM | POA: Diagnosis not present

## 2022-10-25 DIAGNOSIS — R809 Proteinuria, unspecified: Secondary | ICD-10-CM | POA: Diagnosis not present

## 2022-10-25 DIAGNOSIS — E785 Hyperlipidemia, unspecified: Secondary | ICD-10-CM | POA: Diagnosis not present

## 2022-10-25 DIAGNOSIS — I714 Abdominal aortic aneurysm, without rupture, unspecified: Secondary | ICD-10-CM | POA: Diagnosis not present

## 2022-11-07 ENCOUNTER — Ambulatory Visit (INDEPENDENT_AMBULATORY_CARE_PROVIDER_SITE_OTHER): Payer: Medicare Other

## 2022-11-07 DIAGNOSIS — I442 Atrioventricular block, complete: Secondary | ICD-10-CM

## 2022-11-07 LAB — CUP PACEART REMOTE DEVICE CHECK
Battery Remaining Longevity: 106 mo
Battery Remaining Percentage: 86 %
Battery Voltage: 3.01 V
Brady Statistic AP VP Percent: 6.1 %
Brady Statistic AP VS Percent: 1 %
Brady Statistic AS VP Percent: 93 %
Brady Statistic AS VS Percent: 1 %
Brady Statistic RA Percent Paced: 5.3 %
Brady Statistic RV Percent Paced: 99 %
Date Time Interrogation Session: 20240916035617
Implantable Lead Connection Status: 753985
Implantable Lead Connection Status: 753985
Implantable Lead Implant Date: 20221219
Implantable Lead Implant Date: 20221219
Implantable Lead Location: 753859
Implantable Lead Location: 753860
Implantable Pulse Generator Implant Date: 20221219
Lead Channel Impedance Value: 410 Ohm
Lead Channel Impedance Value: 460 Ohm
Lead Channel Pacing Threshold Amplitude: 0.375 V
Lead Channel Pacing Threshold Amplitude: 0.75 V
Lead Channel Pacing Threshold Pulse Width: 0.5 ms
Lead Channel Pacing Threshold Pulse Width: 0.5 ms
Lead Channel Sensing Intrinsic Amplitude: 2.6 mV
Lead Channel Sensing Intrinsic Amplitude: 9.9 mV
Lead Channel Setting Pacing Amplitude: 1 V
Lead Channel Setting Pacing Amplitude: 1.375
Lead Channel Setting Pacing Pulse Width: 0.5 ms
Lead Channel Setting Sensing Sensitivity: 2 mV
Pulse Gen Model: 2272
Pulse Gen Serial Number: 3985814

## 2022-11-09 ENCOUNTER — Ambulatory Visit (HOSPITAL_COMMUNITY)
Admission: RE | Admit: 2022-11-09 | Discharge: 2022-11-09 | Disposition: A | Discharge status: Home / Self Care | Payer: Medicare Other | Source: Ambulatory Visit | Attending: Hematology | Admitting: Hematology

## 2022-11-09 ENCOUNTER — Inpatient Hospital Stay: Payer: Medicare Other | Attending: Hematology

## 2022-11-09 DIAGNOSIS — Z452 Encounter for adjustment and management of vascular access device: Secondary | ICD-10-CM | POA: Diagnosis not present

## 2022-11-09 DIAGNOSIS — C183 Malignant neoplasm of hepatic flexure: Secondary | ICD-10-CM | POA: Insufficient documentation

## 2022-11-09 DIAGNOSIS — C189 Malignant neoplasm of colon, unspecified: Secondary | ICD-10-CM | POA: Diagnosis not present

## 2022-11-09 DIAGNOSIS — D509 Iron deficiency anemia, unspecified: Secondary | ICD-10-CM | POA: Insufficient documentation

## 2022-11-09 DIAGNOSIS — Z85038 Personal history of other malignant neoplasm of large intestine: Secondary | ICD-10-CM | POA: Diagnosis not present

## 2022-11-09 DIAGNOSIS — I714 Abdominal aortic aneurysm, without rupture, unspecified: Secondary | ICD-10-CM | POA: Diagnosis not present

## 2022-11-09 LAB — CBC WITH DIFFERENTIAL/PLATELET
Abs Immature Granulocytes: 0.01 10*3/uL (ref 0.00–0.07)
Basophils Absolute: 0.1 10*3/uL (ref 0.0–0.1)
Basophils Relative: 1 %
Eosinophils Absolute: 0.3 10*3/uL (ref 0.0–0.5)
Eosinophils Relative: 6 %
HCT: 37.8 % — ABNORMAL LOW (ref 39.0–52.0)
Hemoglobin: 12.3 g/dL — ABNORMAL LOW (ref 13.0–17.0)
Immature Granulocytes: 0 %
Lymphocytes Relative: 51 %
Lymphs Abs: 2.8 10*3/uL (ref 0.7–4.0)
MCH: 32.7 pg (ref 26.0–34.0)
MCHC: 32.5 g/dL (ref 30.0–36.0)
MCV: 100.5 fL — ABNORMAL HIGH (ref 80.0–100.0)
Monocytes Absolute: 0.5 10*3/uL (ref 0.1–1.0)
Monocytes Relative: 9 %
Neutro Abs: 1.8 10*3/uL (ref 1.7–7.7)
Neutrophils Relative %: 33 %
Platelets: 194 10*3/uL (ref 150–400)
RBC: 3.76 MIL/uL — ABNORMAL LOW (ref 4.22–5.81)
RDW: 13.7 % (ref 11.5–15.5)
WBC: 5.5 10*3/uL (ref 4.0–10.5)
nRBC: 0 % (ref 0.0–0.2)

## 2022-11-09 LAB — IRON AND TIBC
Iron: 120 ug/dL (ref 45–182)
Saturation Ratios: 35 % (ref 17.9–39.5)
TIBC: 342 ug/dL (ref 250–450)
UIBC: 222 ug/dL

## 2022-11-09 LAB — COMPREHENSIVE METABOLIC PANEL WITH GFR
ALT: 18 U/L (ref 0–44)
AST: 24 U/L (ref 15–41)
Albumin: 3.6 g/dL (ref 3.5–5.0)
Alkaline Phosphatase: 89 U/L (ref 38–126)
Anion gap: 7 (ref 5–15)
BUN: 33 mg/dL — ABNORMAL HIGH (ref 8–23)
CO2: 19 mmol/L — ABNORMAL LOW (ref 22–32)
Calcium: 8.9 mg/dL (ref 8.9–10.3)
Chloride: 108 mmol/L (ref 98–111)
Creatinine, Ser: 2.04 mg/dL — ABNORMAL HIGH (ref 0.61–1.24)
GFR, Estimated: 34 mL/min — ABNORMAL LOW (ref >60)
Glucose, Bld: 80 mg/dL (ref 70–99)
Potassium: 5.5 mmol/L — ABNORMAL HIGH (ref 3.5–5.1)
Sodium: 134 mmol/L — ABNORMAL LOW (ref 135–145)
Total Bilirubin: 0.5 mg/dL (ref 0.3–1.2)
Total Protein: 7.5 g/dL (ref 6.5–8.1)

## 2022-11-09 LAB — FERRITIN: Ferritin: 260 ng/mL (ref 24–336)

## 2022-11-09 MED ORDER — HEPARIN SOD (PORK) LOCK FLUSH 100 UNIT/ML IV SOLN
500.0000 [IU] | Freq: Once | INTRAVENOUS | Status: AC
  Administered 2022-11-09: 500 [IU] via INTRAVENOUS

## 2022-11-09 MED ORDER — SODIUM CHLORIDE 0.9% FLUSH
10.0000 mL | Freq: Once | INTRAVENOUS | Status: AC
  Administered 2022-11-09: 10 mL via INTRAVENOUS

## 2022-11-09 NOTE — Progress Notes (Signed)
Patients port flushed without difficulty.  Good blood return noted with no bruising or swelling noted at site.  Band aid applied.  VSS with discharge and left in satisfactory condition with no s/s of distress noted.   

## 2022-11-09 NOTE — Patient Instructions (Signed)
[facility]  Discharge Instructions: Thank you for choosing [facility] to provide your oncology and hematology care.  If you have a lab appointment with the Cancer Center - please note that after April 8th, 2024, all labs will be drawn in the cancer center.  You do not have to check in or register with the main entrance as you have in the past but will complete your check-in in the cancer center.  Wear comfortable clothing and clothing appropriate for easy access to any Portacath or PICC line.   We strive to give you quality time with your provider. You may need to reschedule your appointment if you arrive late (15 or more minutes).  Arriving late affects you and other patients whose appointments are after yours.  Also, if you miss three or more appointments without notifying the office, you may be dismissed from the clinic at the provider's discretion.      For prescription refill requests, have your pharmacy contact our office and allow 72 hours for refills to be completed.    Today you received the following chemotherapy and/or immunotherapy agents port flush labs      To help prevent nausea and vomiting after your treatment, we encourage you to take your nausea medication as directed.  BELOW ARE SYMPTOMS THAT SHOULD BE REPORTED IMMEDIATELY: *FEVER GREATER THAN 100.4 F (38 C) OR HIGHER *CHILLS OR SWEATING *NAUSEA AND VOMITING THAT IS NOT CONTROLLED WITH YOUR NAUSEA MEDICATION *UNUSUAL SHORTNESS OF BREATH *UNUSUAL BRUISING OR BLEEDING *URINARY PROBLEMS (pain or burning when urinating, or frequent urination) *BOWEL PROBLEMS (unusual diarrhea, constipation, pain near the anus) TENDERNESS IN MOUTH AND THROAT WITH OR WITHOUT PRESENCE OF ULCERS (sore throat, sores in mouth, or a toothache) UNUSUAL RASH, SWELLING OR PAIN  UNUSUAL VAGINAL DISCHARGE OR ITCHING   Items with * indicate a potential emergency and should be followed up as soon as possible or go to  the Emergency Department if any problems should occur.  Please show the CHEMOTHERAPY ALERT CARD or IMMUNOTHERAPY ALERT CARD at check-in to the Emergency Department and triage nurse.  Should you have questions after your visit or need to cancel or reschedule your appointment, please contact [facility] [phone]  and follow the prompts.  Office hours are 8:00 a.m. to 4:30 p.m. Monday - Friday. Please note that voicemails left after 4:00 p.m. may not be returned until the following business day.  We are closed weekends and major holidays. You have access to a nurse at all times for urgent questions. Please call the main number to the clinic [phone] and follow the prompts.  For any non-urgent questions, you may also contact your provider using MyChart. We now offer e-Visits for anyone 6 and older to request care online for non-urgent symptoms. For details visit [email or web address].   Also download the MyChart app! Go to the app store, search "MyChart", open the app, select [place], and log in with your MyChart username and password.

## 2022-11-10 LAB — CEA: CEA: 4.8 ng/mL — ABNORMAL HIGH (ref 0.0–4.7)

## 2022-11-21 ENCOUNTER — Ambulatory Visit: Payer: Medicare Other | Admitting: Hematology

## 2022-11-21 NOTE — Progress Notes (Signed)
Remote pacemaker transmission.   

## 2022-11-21 NOTE — Progress Notes (Signed)
[name] [address], [place] [address]    Clinic Day:  11/21/2022  Referring physician: Leone Payor, FNP  Patient Care Team: Leone Payor, FNP as PCP - General (Family Medicine) [name], MD as PCP - Cardiology (Cardiology) [name], MD as Medical Oncologist (Medical Oncology) [name], RN as Oncology Nurse Navigator (Medical Oncology)   ASSESSMENT & PLAN:   Assessment: 1. 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 - Colonoscopy on 03/12/2021: Fungating partially obstructing large mass found at 40 cm proximal to the anus, circumferential with no bleeding present. - CEA on 03/12/2021: 8.3 - CT angio abdomen and pelvis on 04/08/2021: No evidence of metastatic lymphadenopathy or liver mets.  Stable 7 mm subpleural pulmonary nodule in the periphery of the right lower lobe. - CT angio CAP on 02/04/2021: No evidence of lung metastasis, abdominal metastasis. - Resection of splenic flexure mass on 04/23/2021 by Dr. Maisie Fus. - Pathology: Moderately differentiated adenocarcinoma, invades into subserosa, margins negative, 0/38 lymph nodes.  No LVI or perineural invasion.  PT3 pN0.  MSI-stable. - Guardant reveal (05/21/2021): CT DNA was detected. - PET scan on 06/10/2021 shows 2 cm hypermetabolic soft tissue density in the transverse colon.  No evidence of local or distant metastatic disease.   2. Social/family history: - He is a retired Journalist, newspaper. - Current active smoker, half pack per day for 50 years. - Mother had colon cancer at age 29.  Maternal grandmother had colon cancer.  3.  Stage III (U0AV4) right colon adenocarcinoma: - Right colectomy on 08/10/2021 by Dr. Maisie Fus. - Pathology with grade 2 moderately differentiated colonic adenocarcinoma, tumor site ascending colon, margins negative, 1/22 lymph nodes involved, [id number].  MMR preserved.   - 12 cycles of FOLFOX from 10/05/2021  through 03/23/2022    Plan: 1. Stage III (T4AN1) right colon adenocarcinoma, MMR preserved: - Denies any change in bowel habits.  Denies any BRBPR/melena. - Reviewed labs: Normal LFTs.  CEA is 4.9 and stable. - Recommend follow-up in 3 months with repeat CEA and LFTs.  Will also do CTAP without contrast.  2.  Normocytic anemia: - Combination anemia from CKD and relative iron deficiency. - Labs on 08/01/2022: Hemoglobin normal at 13.1 and ferritin 130, percent saturation 16.  Will plan on repeating ferritin and iron panel at next visit.  3.  Peripheral neuropathy: - He has constant tingling in the fingertips and occasional tingling in the toes.  This is from oxaliplatin.  No neuropathic pains.    No orders of the defined types were placed in this encounter.     [name],acting as a Neurosurgeon for [name], MD.,have documented all relevant documentation on the behalf of [name], MD,as directed by  [name], MD while in the presence of [name], MD.  ***   [name]   9/30/20249:15 PM  CHIEF COMPLAINT:   Diagnosis: stage 3B right colon cancer    Cancer Staging  Cancer of left colon [facility]) Staging form: Colon and Rectum, AJCC 8th Edition - Clinical stage from 05/11/2021: Stage IIA (cT3, cN0, cM0) - Unsigned  Primary cancer of hepatic flexure s/p robotic right colectomy 08/18/2021 Staging form: Colon and Rectum, AJCC 8th Edition - Clinical stage from 09/23/2021: Stage IIIB (cT4a, cN1a, cM0) - Unsigned    Prior Therapy: 1. Resection of splenic flexure mass on 04/23/2021 by Dr. Maisie Fus 2. right colectomy on 08/18/2021  3. Adjuvant FOLFOX, 12  cycles, 10/05/21 - 03/23/22  Current Therapy:  surveillance   HISTORY OF PRESENT ILLNESS:   Oncology History  Primary cancer of hepatic flexure s/p robotic right colectomy 08/18/2021  08/18/2021 Initial Diagnosis   Primary cancer of hepatic flexure s/p robotic right colectomy 08/18/2021    10/05/2021 - 10/21/2021 Chemotherapy   Patient is on Treatment Plan : COLORECTAL FOLFOX q14d x 6 months     10/05/2021 -  Chemotherapy   Patient is on Treatment Plan : COLORECTAL FOLFOX q14d x 6 months        INTERVAL HISTORY:   Fernando Dean is a 72 y.o. male presenting to clinic today for follow up of stage 3B right colon cancer. He was last seen by me on 08/09/22.  Since his last visit, he underwent CT A/P on 11/09/22 that found: no evidence of recurrent or metastatic carcinoma.   Today, he states that he is doing well overall. His appetite level is at ***%. His energy level is at ***%.  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 OR;  Service: Vascular;  Laterality: Bilateral;   BIOPSY  03/12/2021   Procedure: BIOPSY;  Surgeon: Dolores Frame, MD;  [facility];  Service: Gastroenterology;;   BIOPSY  06/25/2021   Procedure: BIOPSY;  Surgeon: Dolores Frame, MD;  [facility]: [facility];  Service: Gastroenterology;;  mass    COLONOSCOPY WITH PROPOFOL N/A 03/12/2021   Procedure: COLONOSCOPY WITH PROPOFOL;  Surgeon: Dolores Frame, MD;  [facility];  Service: Gastroenterology;  Laterality: N/A;  10:55   COLONOSCOPY WITH PROPOFOL N/A 06/25/2021   Procedure:  COLONOSCOPY WITH PROPOFOL;  Surgeon: Dolores Frame, MD;  [facility]: [facility];  Service: Gastroenterology;  Laterality: N/A;  235 ASA 2   HEMOSTASIS CLIP PLACEMENT  03/12/2021   Procedure: HEMOSTASIS CLIP PLACEMENT;  Surgeon: Dolores Frame, MD;  [facility];  Service: Gastroenterology;;   HEMOSTASIS CLIP PLACEMENT  06/25/2021   Procedure: HEMOSTASIS CLIP PLACEMENT;  Surgeon: Dolores Frame, MD;  [facility];  Service: Gastroenterology;;   HERNIA REPAIR Left 10/25/2006   IR IMAGING GUIDED PORT INSERTION  09/29/2021   PACEMAKER IMPLANT N/A 02/08/2021   Procedure: PACEMAKER IMPLANT;  Surgeon: Duke Salvia, MD;  Location: [facility] INVASIVE CV LAB;  Service: Cardiovascular;  Laterality: N/A;   POLYPECTOMY  03/12/2021   Procedure: POLYPECTOMY;  Surgeon: Dolores Frame, MD;  [facility];  Service: Gastroenterology;;   POLYPECTOMY  06/25/2021   Procedure: POLYPECTOMY;  Surgeon: Dolores Frame, MD;  [facility];  Service: Gastroenterology;;   Susa Day  03/12/2021   Procedure: Susa Day;  Surgeon: Marguerita Merles, Reuel Boom, MD;  [facility];  Service: Gastroenterology;;  Social History: Social History   Socioeconomic History   Marital status: Single    Spouse name: Not on file   Number of children: Not on file   Years of education: Not on file   Highest education level: Not on file  Occupational History   Not on file  Tobacco Use   Smoking status: Every Day    Current packs/day: 0.75    Average packs/day: 0.8 packs/day for 50.0 years (37.5 ttl pk-yrs)    Types: Cigarettes    Passive exposure: Current   Smokeless tobacco: Never  Vaping Use   Vaping status: Never Used  Substance and Sexual Activity   Alcohol use: Yes    Alcohol/week: 2.0 - 3.0 standard drinks of alcohol    Types: 2 - 3 Shots of liquor per week    Comment: daily liquor moderatly   Drug use: Not on file     Comment: uses every other day   Sexual activity: Not Currently  Other Topics Concern   Not on file  Social History Narrative   Not on file   Social Determinants of Health   Financial Resource Strain: High Risk (10/14/2021)   Overall Financial Resource Strain (CARDIA)    Difficulty of Paying Living Expenses: Hard  Food Insecurity: Not on file  Transportation Needs: Not on file  Physical Activity: Not on file  Stress: Not on file  Social Connections: Not on file  Intimate Partner Violence: Not on file    Family History: Family History  Problem Relation Age of Onset   Heart disease Father    Colon polyps Sister        less than 10 lifetime polyps   Colon cancer Maternal Grandmother        dx 90s    Current Medications:  Current Outpatient Medications:    albuterol (VENTOLIN HFA) 108 (90 Base) MCG/ACT inhaler, Inhale 2 puffs into the lungs every 6 (six) hours as needed for wheezing or shortness of breath., Disp: 8 g, Rfl: 2   amLODipine (NORVASC) 2.5 MG tablet, TAKE 1 TABLET(2.5 MG) BY MOUTH DAILY, Disp: 90 tablet, Rfl: 3   aspirin EC 81 MG EC tablet, Take 1 tablet (81 mg total) by mouth daily at 6 (six) AM. Swallow whole., Disp: 30 tablet, Rfl: 11   atorvastatin (LIPITOR) 10 MG tablet, Take 10 mg by mouth daily., Disp: , Rfl:    bisacodyl (DULCOLAX) 5 MG EC tablet, Take 5 mg by mouth daily as needed for moderate constipation., Disp: , Rfl:    carvedilol (COREG) 6.25 MG tablet, Take 1 tablet (6.25 mg total) by mouth 2 (two) times daily with a meal., Disp: 60 tablet, Rfl: 3   famotidine (PEPCID) 20 MG tablet, Take 20 mg by mouth daily as needed for heartburn or indigestion., Disp: , Rfl:    feeding supplement (ENSURE ENLIVE / ENSURE PLUS) LIQD, Take 237 mLs by mouth 3 (three) times daily between meals. (Patient taking differently: Take 237 mLs by mouth 3 (three) times daily as needed (nutrition).), Disp: 237 mL, Rfl: 12   fluorouracil CALGB 95284 2,400 mg/m2 in sodium chloride  0.9 % 150 mL, Inject 2,400 mg/m2 into the vein over 48 hr. Every 14 days, Disp: , Rfl:    FLUOROURACIL IV, Inject into the vein every 14 (fourteen) days., Disp: , Rfl:    LEUCOVORIN CALCIUM IV, Inject into the vein every 14 (fourteen) days., Disp: , Rfl:    loperamide (IMODIUM A-D) 2 MG tablet, Take 1 tablet (2  mg total) by mouth 4 (four) times daily as needed for diarrhea or loose stools., Disp: , Rfl: 0   nicotine (NICODERM CQ - DOSED IN MG/24 HOURS) 21 mg/24hr patch, Place 21 mg onto the skin daily., Disp: , Rfl:    OXALIPLATIN IV, Inject into the vein every 14 (fourteen) days., Disp: , Rfl:    prochlorperazine (COMPAZINE) 10 MG tablet, , Disp: , Rfl:    sodium bicarbonate 650 MG tablet, Take 650 mg by mouth 3 (three) times daily., Disp: , Rfl:    Vitamin D, Ergocalciferol, (DRISDOL) 1.25 MG (50000 UNIT) CAPS capsule, Take 50,000 Units by mouth once a week., Disp: , Rfl:  No current facility-administered medications for this visit.  Facility-Administered Medications Ordered in Other Visits:    magnesium sulfate 2 GM/50ML IVPB, , , ,    palonosetron (ALOXI) 0.25 MG/5ML injection, , , ,    sodium chloride flush (NS) 0.9 % injection 10 mL, 10 mL, Intracatheter, PRN, [name], MD, 10 mL at 11/18/21 1257   Allergies: Allergies  Allergen Reactions   Lisinopril Anaphylaxis   Naproxen Shortness Of Breath and Palpitations   Nsaids Shortness Of Breath and Palpitations    REVIEW OF SYSTEMS:   Review of Systems  Constitutional:  Negative for chills, fatigue and fever.  HENT:   Negative for lump/mass, mouth sores, nosebleeds, sore throat and trouble swallowing.   Eyes:  Negative for eye problems.  Respiratory:  Negative for cough and shortness of breath.   Cardiovascular:  Negative for chest pain, leg swelling and palpitations.  Gastrointestinal:  Negative for abdominal pain, constipation, diarrhea, nausea and vomiting.  Genitourinary:  Negative for bladder incontinence, difficulty  urinating, dysuria, frequency, hematuria and nocturia.   Musculoskeletal:  Negative for arthralgias, back pain, flank pain, myalgias and neck pain.  Skin:  Negative for itching and rash.  Neurological:  Negative for dizziness, headaches and numbness.  Hematological:  Does not bruise/bleed easily.  Psychiatric/Behavioral:  Negative for depression, sleep disturbance and suicidal ideas. The patient is not nervous/anxious.   All other systems reviewed and are negative.    VITALS:   There were no vitals taken for this visit.  Wt Readings from Last 3 Encounters:  08/09/22 169 lb 6.4 oz (76.8 kg)  06/29/22 142 lb (64.4 kg)  04/25/22 164 lb 14.5 oz (74.8 kg)    There is no height or weight on file to calculate BMI.  Performance status (ECOG): 1 - Symptomatic but completely ambulatory  PHYSICAL EXAM:   Physical Exam Vitals and nursing note reviewed. Exam conducted with a chaperone present.  Constitutional:      Appearance: Normal appearance.  Cardiovascular:     Rate and Rhythm: Normal rate and regular rhythm.     Pulses: Normal pulses.     Heart sounds: Normal heart sounds.  Pulmonary:     Effort: Pulmonary effort is normal.     Breath sounds: Normal breath sounds.  Abdominal:     Palpations: Abdomen is soft. There is no hepatomegaly, splenomegaly or mass.     Tenderness: There is no abdominal tenderness.  Musculoskeletal:     Right lower leg: No edema.     Left lower leg: No edema.  Lymphadenopathy:     Cervical: No cervical adenopathy.     Right cervical: No superficial, deep or posterior cervical adenopathy.    Left cervical: No superficial, deep or posterior cervical adenopathy.     Upper Body:     Right upper body: No supraclavicular  or axillary adenopathy.     Left upper body: No supraclavicular or axillary adenopathy.  Neurological:     General: No focal deficit present.     Mental Status: He is alert and oriented to person, place, and time.  Psychiatric:        Mood  and Affect: Mood normal.        Behavior: Behavior normal.     LABS:      Latest Ref Rng & Units 11/09/2022    1:21 PM 08/01/2022    1:45 PM 04/21/2022   11:44 AM  CBC  WBC 4.0 - 10.5 K/uL 5.5  5.2  8.7   Hemoglobin 13.0 - 17.0 g/dL 30.8  65.7  84.6   Hematocrit 39.0 - 52.0 % 37.8  39.9  36.7   Platelets 150 - 400 K/uL 194  179  162       Latest Ref Rng & Units 11/09/2022    1:21 PM 08/01/2022    1:45 PM 04/21/2022   11:44 AM  CMP  Glucose 70 - 99 mg/dL 80  962  78   BUN 8 - 23 mg/dL 33  26  25   Creatinine 0.61 - 1.24 mg/dL 9.52  8.41  3.24   Sodium 135 - 145 mmol/L 134  139  133   Potassium 3.5 - 5.1 mmol/L 5.5  4.5  5.3   Chloride 98 - 111 mmol/L 108  110  110   CO2 22 - 32 mmol/L 19  22  15    Calcium 8.9 - 10.3 mg/dL 8.9  9.2  9.2   Total Protein 6.5 - 8.1 g/dL 7.5  7.8  7.9   Total Bilirubin 0.3 - 1.2 mg/dL 0.5  0.4  0.7   Alkaline Phos 38 - 126 U/L 89  110  111   AST 15 - 41 U/L 24  22  18    ALT 0 - 44 U/L 18  14  10       Lab Results  Component Value Date   CEA1 4.8 (H) 11/09/2022   /  CEA  Date Value Ref Range Status  11/09/2022 4.8 (H) 0.0 - 4.7 ng/mL Final    Comment:    (NOTE)                             Nonsmokers          <3.9                             Smokers             <5.6 [employer] Electrochemiluminescence Immunoassay (ECLIA) Values obtained with different assay methods or kits cannot be used interchangeably.  Results cannot be interpreted as absolute evidence of the presence or absence of malignant disease. Performed At: [facility] [address] [place], [place] [id number] Jolene Schimke MD [id number]    No results found for: "PSA1" No results found for: "CAN199" No results found for: "CAN125"  No results found for: "TOTALPROTELP", "ALBUMINELP", "A1GS", "A2GS", "BETS", "BETA2SER", "GAMS", "MSPIKE", "SPEI" Lab Results  Component Value Date   TIBC 342 11/09/2022   TIBC 347 08/01/2022   TIBC 505 (H) 05/11/2021    FERRITIN 260 11/09/2022   FERRITIN 130 08/01/2022   FERRITIN 25 05/11/2021   IRONPCTSAT 35 11/09/2022   IRONPCTSAT 16 (L) 08/01/2022   IRONPCTSAT 7 (L) 05/11/2021   Lab  Results  Component Value Date   LDH 141 05/11/2021     STUDIES:   CT Abdomen Pelvis Wo Contrast  Result Date: 11/21/2022 CLINICAL DATA:  Follow-up colon carcinoma. Surveillance. * Tracking Code: BO * EXAM: CT ABDOMEN AND PELVIS WITHOUT CONTRAST TECHNIQUE: Multidetector CT imaging of the abdomen and pelvis was performed following the standard protocol without IV contrast. RADIATION DOSE REDUCTION: This exam was performed according to the departmental dose-optimization program which includes automated exposure control, adjustment of the mA and/or kV according to patient size and/or use of iterative reconstruction technique. COMPARISON:  04/25/2022 FINDINGS: Lower chest: No acute findings. Hepatobiliary: No mass visualized on this unenhanced exam. Gallbladder is unremarkable. No evidence of biliary ductal dilatation. Pancreas: No mass or inflammatory process visualized on this unenhanced exam. Spleen:  Within normal limits in size. Adrenals/Urinary tract: No evidence of urolithiasis or hydronephrosis. Diffuse left renal parenchymal atrophy and mild right renal parenchymal scarring again noted. Unremarkable unopacified urinary bladder. Stomach/Bowel: Postop changes from partial colectomy again demonstrated. No evidence of obstruction, inflammatory process, or abnormal fluid collections. Vascular/Lymphatic: No pathologically enlarged lymph nodes identified. 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 Native aneurysm sac measures 5.1 cm, without significant change. Reproductive:  No mass or other significant abnormality. Other:  None. Musculoskeletal:  No suspicious bone lesions identified. IMPRESSION: Stable exam. No evidence of recurrent or metastatic carcinoma. Electronically Signed   By: Danae Orleans M.D.   On:  11/21/2022 09:04   CUP PACEART REMOTE DEVICE CHECK  Result Date: 11/07/2022 Scheduled remote reviewed. Normal device function.  Next remote 91 days. - CS, CVRS

## 2022-11-22 ENCOUNTER — Inpatient Hospital Stay: Payer: Medicare Other | Attending: Hematology | Admitting: Hematology

## 2022-11-22 VITALS — BP 121/72 | HR 61 | Temp 98.3°F | Resp 17 | Ht 72.0 in | Wt 167.6 lb

## 2022-11-22 DIAGNOSIS — C183 Malignant neoplasm of hepatic flexure: Secondary | ICD-10-CM

## 2022-11-22 DIAGNOSIS — E875 Hyperkalemia: Secondary | ICD-10-CM | POA: Insufficient documentation

## 2022-11-22 DIAGNOSIS — D631 Anemia in chronic kidney disease: Secondary | ICD-10-CM | POA: Insufficient documentation

## 2022-11-22 DIAGNOSIS — N189 Chronic kidney disease, unspecified: Secondary | ICD-10-CM | POA: Insufficient documentation

## 2022-11-22 DIAGNOSIS — D509 Iron deficiency anemia, unspecified: Secondary | ICD-10-CM | POA: Diagnosis not present

## 2022-11-22 DIAGNOSIS — I129 Hypertensive chronic kidney disease with stage 1 through stage 4 chronic kidney disease, or unspecified chronic kidney disease: Secondary | ICD-10-CM | POA: Diagnosis not present

## 2022-11-22 DIAGNOSIS — G629 Polyneuropathy, unspecified: Secondary | ICD-10-CM | POA: Diagnosis not present

## 2022-11-22 DIAGNOSIS — Z08 Encounter for follow-up examination after completed treatment for malignant neoplasm: Secondary | ICD-10-CM | POA: Diagnosis not present

## 2022-11-22 DIAGNOSIS — Z85038 Personal history of other malignant neoplasm of large intestine: Secondary | ICD-10-CM | POA: Insufficient documentation

## 2022-11-22 NOTE — Patient Instructions (Signed)
[facility] Discharge Instructions   You were seen and examined today by Dr. Ellin Saba.  He reviewed the results of your lab work which are mostly normal/stable. Your potassium was elevated.   He reviewed the results of your CT scan which are normal/stable.   You need to avoid high potassium foods. We will provide a list of these.   We will see you back in 3 months. We will repeat lab work prior to this visit.   Return as scheduled.   Thank you for choosing [facility] at [facility] to provide your oncology and hematology care.  To afford each patient quality time with our provider, please arrive at least 15 minutes before your scheduled appointment time.   If you have a lab appointment with the Cancer Center please come in thru the Main Entrance and check in at the main information desk.  You need to re-schedule your appointment should you arrive 10 or more minutes late.  We strive to give you quality time with our providers, and arriving late affects you and other patients whose appointments are after yours.  Also, if you no show three or more times for appointments you may be dismissed from the clinic at the providers discretion.     Again, thank you for choosing [facility].  Our hope is that these requests will decrease the amount of time that you wait before being seen by our physicians.       _____________________________________________________________  Should you have questions after your visit to [facility], please contact our office at [phone] and follow the prompts.  Our office hours are 8:00 a.m. and 4:30 p.m. Monday - Friday.  Please note that voicemails left after 4:00 p.m. may not be returned until the following business day.  We are closed weekends and major holidays.  You do have access to a nurse 24-7, just call the main number to the clinic [phone] and do not press any  options, hold on the line and a nurse will answer the phone.    For prescription refill requests, have your pharmacy contact our office and allow 72 hours.    Due to Covid, you will need to wear a mask upon entering the hospital. If you do not have a mask, a mask will be given to you at the Main Entrance upon arrival. For doctor visits, patients may have 1 support person age 8 or older with them. For treatment visits, patients can not have anyone with them due to social distancing guidelines and our immunocompromised population.

## 2022-11-25 ENCOUNTER — Ambulatory Visit: Payer: Medicare Other

## 2022-11-25 ENCOUNTER — Other Ambulatory Visit (HOSPITAL_COMMUNITY): Payer: Medicare Other

## 2022-12-01 ENCOUNTER — Other Ambulatory Visit: Payer: Self-pay | Admitting: *Deleted

## 2022-12-01 DIAGNOSIS — Z9889 Other specified postprocedural states: Secondary | ICD-10-CM

## 2022-12-13 ENCOUNTER — Telehealth: Payer: Self-pay | Admitting: Vascular Surgery

## 2022-12-13 ENCOUNTER — Ambulatory Visit: Payer: Medicare Other

## 2022-12-13 ENCOUNTER — Ambulatory Visit (HOSPITAL_COMMUNITY): Payer: Medicare Other | Attending: Vascular Surgery

## 2022-12-16 ENCOUNTER — Encounter: Payer: Self-pay | Admitting: Hematology

## 2022-12-23 NOTE — Telephone Encounter (Signed)
Closing encounter

## 2022-12-27 ENCOUNTER — Ambulatory Visit (HOSPITAL_COMMUNITY)
Admission: RE | Admit: 2022-12-27 | Discharge: 2022-12-27 | Disposition: A | Discharge status: Home / Self Care | Payer: Medicare Other | Source: Ambulatory Visit | Attending: Vascular Surgery | Admitting: Vascular Surgery

## 2022-12-27 ENCOUNTER — Ambulatory Visit: Payer: Medicare Other | Admitting: Physician Assistant

## 2022-12-27 VITALS — BP 145/82 | HR 60 | Temp 97.9°F | Ht <= 58 in | Wt 166.4 lb

## 2022-12-27 DIAGNOSIS — Z9889 Other specified postprocedural states: Secondary | ICD-10-CM

## 2022-12-27 NOTE — Progress Notes (Signed)
Office Note     CC:  follow up Requesting Provider:  Leone Payor, FNP  HPI: Fernando Dean is a 72 y.o. (1950/06/13) male who presents for surveillance follow up of EVAR on 02/26/21 for 5.8 cm AAA by Dr. Chestine Spore. He has done very well post operatively without any abdominal pain or back pain. His initial post op CT showed stable repair without endoleak.   Today he is without any complaints. He denies any pain in his back or abdomen. No claudication pain, rest pain or tissue loss. He does report intermittent tingling in his finger tips and toes.He continues to smoke daily. He is medically managed on Aspirin and statin.   Earlier this year he was diagnosed with colon cancer and underwent colon resection followed by Chemotherapy. He reports overall doing well since. He is followed by Dr. Ellin Saba at the [facility].  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 - [employer] MRI 2272 dual chamber PPM   Tuberculosis    as a child, was treated    Past Surgical History:  Procedure Laterality Date   ABDOMINAL AORTIC ENDOVASCULAR STENT GRAFT Bilateral 02/26/2021   Procedure: ABDOMINAL AORTIC ENDOVASCULAR STENT GRAFT REPAIR;  Surgeon: Cephus Shelling, MD;  Location: [facility] OR;  Service: Vascular;  Laterality: Bilateral;   BIOPSY  03/12/2021   Procedure: BIOPSY;  Surgeon: Dolores Frame, MD;  Location: [facility];  Service: Gastroenterology;;   BIOPSY  06/25/2021   Procedure: BIOPSY;  Surgeon: Dolores Frame, MD;  [facility]: [facility];  Service: Gastroenterology;;  mass    COLONOSCOPY WITH PROPOFOL N/A 03/12/2021   Procedure: COLONOSCOPY WITH PROPOFOL;   Surgeon: Dolores Frame, MD;  [facility];  Service: Gastroenterology;  Laterality: N/A;  10:55   COLONOSCOPY WITH PROPOFOL N/A 06/25/2021   Procedure: COLONOSCOPY WITH PROPOFOL;  Surgeon: Dolores Frame, MD;  [facility]: [facility];  Service: Gastroenterology;  Laterality: N/A;  235 ASA 2   HEMOSTASIS CLIP PLACEMENT  03/12/2021   Procedure: HEMOSTASIS CLIP PLACEMENT;  Surgeon: Dolores Frame, MD;  [facility];  Service: Gastroenterology;;   HEMOSTASIS CLIP PLACEMENT  06/25/2021   Procedure: HEMOSTASIS CLIP PLACEMENT;  Surgeon: Dolores Frame, MD;  [facility];  Service: Gastroenterology;;   HERNIA REPAIR Left 10/25/2006   IR IMAGING GUIDED PORT INSERTION  09/29/2021   PACEMAKER IMPLANT N/A 02/08/2021   Procedure: PACEMAKER IMPLANT;  Surgeon: Duke Salvia, MD;  Location: [facility] INVASIVE CV LAB;  Service: Cardiovascular;  Laterality: N/A;   POLYPECTOMY  03/12/2021   Procedure: POLYPECTOMY;  Surgeon: Dolores Frame, MD;  [facility];  Service: Gastroenterology;;   POLYPECTOMY  06/25/2021   Procedure: POLYPECTOMY;  Surgeon: Dolores Frame, MD;  [facility];  Service: Gastroenterology;;   Susa Day  03/12/2021   Procedure: Susa Day;  Surgeon: Marguerita Merles, Reuel Boom, MD;  [facility];  Service: Gastroenterology;;    Social History   Socioeconomic History   Marital status: Single    Spouse name: Not on file   Number of children: Not on file  Years of education: Not on file   Highest education level: Not on file  Occupational History   Not on file  Tobacco Use   Smoking status: Every Day    Current packs/day: 0.75    Average packs/day: 0.8 packs/day for 50.0 years (37.5 ttl pk-yrs)    Types: Cigarettes    Passive exposure: Current   Smokeless tobacco: Never  Vaping Use   Vaping status: Never Used  Substance and Sexual Activity   Alcohol use: Yes     Alcohol/week: 2.0 - 3.0 standard drinks of alcohol    Types: 2 - 3 Shots of liquor per week    Comment: daily liquor moderatly   Drug use: Not on file    Comment: uses every other day   Sexual activity: Not Currently  Other Topics Concern   Not on file  Social History Narrative   Not on file   Social Determinants of Health   Financial Resource Strain: High Risk (10/14/2021)   Overall Financial Resource Strain (CARDIA)    Difficulty of Paying Living Expenses: Hard  Food Insecurity: Not on file  Transportation Needs: Not on file  Physical Activity: Not on file  Stress: Not on file  Social Connections: Not on file  Intimate Partner Violence: Not on file    Family History  Problem Relation Age of Onset   Heart disease Father    Colon polyps Sister        less than 10 lifetime polyps   Colon cancer Maternal Grandmother        dx 90s    Current Outpatient Medications  Medication Sig Dispense Refill   albuterol (VENTOLIN HFA) 108 (90 Base) MCG/ACT inhaler Inhale 2 puffs into the lungs every 6 (six) hours as needed for wheezing or shortness of breath. 8 g 2   amLODipine (NORVASC) 2.5 MG tablet TAKE 1 TABLET(2.5 MG) BY MOUTH DAILY 90 tablet 3   aspirin EC 81 MG EC tablet Take 1 tablet (81 mg total) by mouth daily at 6 (six) AM. Swallow whole. 30 tablet 11   atorvastatin (LIPITOR) 10 MG tablet Take 10 mg by mouth daily.     bisacodyl (DULCOLAX) 5 MG EC tablet Take 5 mg by mouth daily as needed for moderate constipation.     carvedilol (COREG) 6.25 MG tablet Take 1 tablet (6.25 mg total) by mouth 2 (two) times daily with a meal. 60 tablet 3   famotidine (PEPCID) 20 MG tablet Take 20 mg by mouth daily as needed for heartburn or indigestion.     feeding supplement (ENSURE ENLIVE / ENSURE PLUS) LIQD Take 237 mLs by mouth 3 (three) times daily between meals. (Patient taking differently: Take 237 mLs by mouth 3 (three) times daily as needed (nutrition).) 237 mL 12   fluorouracil CALGB  80702 2,400 mg/m2 in sodium chloride 0.9 % 150 mL Inject 2,400 mg/m2 into the vein over 48 hr. Every 14 days     FLUOROURACIL IV Inject into the vein every 14 (fourteen) days.     LEUCOVORIN CALCIUM IV Inject into the vein every 14 (fourteen) days.     lidocaine-prilocaine (EMLA) cream SMARTSIG:1 Application Topical PRN     loperamide (IMODIUM A-D) 2 MG tablet Take 1 tablet (2 mg total) by mouth 4 (four) times daily as needed for diarrhea or loose stools.  0   nicotine (NICODERM CQ - DOSED IN MG/24 HOURS) 21 mg/24hr patch Place 21 mg onto the skin daily.     olmesartan (BENICAR)  5 MG tablet Take 1 tablet by mouth daily.     OXALIPLATIN IV Inject into the vein every 14 (fourteen) days.     prochlorperazine (COMPAZINE) 10 MG tablet      sodium bicarbonate 650 MG tablet Take 650 mg by mouth 3 (three) times daily.     Vitamin D, Ergocalciferol, (DRISDOL) 1.25 MG (50000 UNIT) CAPS capsule Take 50,000 Units by mouth once a week. (Patient not taking: Reported on 12/27/2022)     No current facility-administered medications for this visit.   Facility-Administered Medications Ordered in Other Visits  Medication Dose Route Frequency Provider Last Rate Last Admin   magnesium sulfate 2 GM/50ML IVPB            palonosetron (ALOXI) 0.25 MG/5ML injection            sodium chloride flush (NS) 0.9 % injection 10 mL  10 mL Intracatheter PRN [name], MD   10 mL at 11/18/21 1257    Allergies  Allergen Reactions   Lisinopril Anaphylaxis   Naproxen Shortness Of Breath and Palpitations   Nsaids Shortness Of Breath and Palpitations     REVIEW OF SYSTEMS:   [X]  denotes positive finding, [ ]  denotes negative finding Cardiac  Comments:  Chest pain or chest pressure:    Shortness of breath upon exertion:    Short of breath when lying flat:    Irregular heart rhythm:        Vascular    Pain in calf, thigh, or hip brought on by ambulation:    Pain in feet at night that wakes you up from your  sleep:     Blood clot in your veins:    Leg swelling:         Pulmonary    Oxygen at home:    Productive cough:     Wheezing:         Neurologic    Sudden weakness in arms or legs:     Sudden numbness in arms or legs:     Sudden onset of difficulty speaking or slurred speech:    Temporary loss of vision in one eye:     Problems with dizziness:         Gastrointestinal    Blood in stool:     Vomited blood:         Genitourinary    Burning when urinating:     Blood in urine:        Psychiatric    Major depression:         Hematologic    Bleeding problems:    Problems with blood clotting too easily:        Skin    Rashes or ulcers:        Constitutional    Fever or chills:      PHYSICAL EXAMINATION:  Vitals:   12/27/22 1141  BP: (!) 145/82  Pulse: 60  Temp: 97.9 F (36.6 C)  SpO2: 97%  Weight: 166 lb 6.4 oz (75.5 kg)  Height: (!) 6" (0.152 m)    General:  WDWN in NAD; vital signs documented above Gait: Normal HENT: WNL, normocephalic Pulmonary: normal non-labored breathing Cardiac: regular HR Abdomen: soft, NT, no masses Skin: with rashes on left forearm and palm Vascular Exam/Pulses: 2+ radial pulse, 2+ femoral and DP pulses bilaterally. Feet warm and well perfused Extremities: without ischemic changes, without Gangrene , without cellulitis; without open wounds;  Musculoskeletal: no muscle wasting or atrophy  Neurologic:  A&O X 3 Psychiatric:  The pt has Normal affect.   Non-Invasive Vascular Imaging:   Endovascular Aortic Repair (EVAR):  +----------+----------------+-------------------+-------------------+           Diameter AP (cm)Diameter Trans (cm)Velocities (cm/sec)  +----------+----------------+-------------------+-------------------+  Aorta    4.83            4.48               38                   +----------+----------------+-------------------+-------------------+  Right Limb1.25                               50                    +----------+----------------+-------------------+-------------------+  Left Limb 1.41                               48                   +----------+----------------+-------------------+-------------------+   +-------------+-------------+  Endoleak TypeNone detected  +-------------+-------------+   Summary:  Abdominal Aorta: 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   ASSESSMENT/PLAN:: 72 y.o. male here for surveillance follow up of EVAR on 02/26/21 for 5.8 cm AAA by Dr. Chestine Spore. He has done very well post operatively without any abdominal pain or back pain. His initial post op CT showed stable repair without endoleak.  He continues to be without any associated symptoms. He does not have any claudication, rest pain or tissue loss.  - Duplex today shows stable AAA s/p EVAR. No evidence of endoleak. Aneurysm continues to decrease in size. Maximum diameter is now 4.83 cm.  - Continue Aspirin and statin - Encourage smoking cessation - he will follow up in 1 year with repeat EVAR duplex   Graceann Congress, PA-C [facility] [phone]  Clinic MD:   Lenell Antu

## 2022-12-30 DIAGNOSIS — I129 Hypertensive chronic kidney disease with stage 1 through stage 4 chronic kidney disease, or unspecified chronic kidney disease: Secondary | ICD-10-CM | POA: Diagnosis not present

## 2022-12-30 DIAGNOSIS — I10 Essential (primary) hypertension: Secondary | ICD-10-CM | POA: Diagnosis not present

## 2022-12-30 DIAGNOSIS — E119 Type 2 diabetes mellitus without complications: Secondary | ICD-10-CM | POA: Diagnosis not present

## 2022-12-30 DIAGNOSIS — N1832 Chronic kidney disease, stage 3b: Secondary | ICD-10-CM | POA: Diagnosis not present

## 2022-12-30 DIAGNOSIS — R809 Proteinuria, unspecified: Secondary | ICD-10-CM | POA: Diagnosis not present

## 2023-01-06 DIAGNOSIS — N270 Small kidney, unilateral: Secondary | ICD-10-CM | POA: Diagnosis not present

## 2023-01-06 DIAGNOSIS — N1832 Chronic kidney disease, stage 3b: Secondary | ICD-10-CM | POA: Diagnosis not present

## 2023-01-06 DIAGNOSIS — R809 Proteinuria, unspecified: Secondary | ICD-10-CM | POA: Diagnosis not present

## 2023-01-06 DIAGNOSIS — I129 Hypertensive chronic kidney disease with stage 1 through stage 4 chronic kidney disease, or unspecified chronic kidney disease: Secondary | ICD-10-CM | POA: Diagnosis not present

## 2023-01-12 ENCOUNTER — Other Ambulatory Visit: Payer: Self-pay

## 2023-01-12 DIAGNOSIS — I7143 Infrarenal abdominal aortic aneurysm, without rupture: Secondary | ICD-10-CM

## 2023-02-06 ENCOUNTER — Ambulatory Visit (INDEPENDENT_AMBULATORY_CARE_PROVIDER_SITE_OTHER): Payer: Medicare Other

## 2023-02-06 DIAGNOSIS — I442 Atrioventricular block, complete: Secondary | ICD-10-CM

## 2023-02-06 LAB — CUP PACEART REMOTE DEVICE CHECK
Battery Remaining Longevity: 101 mo
Battery Remaining Percentage: 84 %
Battery Voltage: 3.01 V
Brady Statistic AP VP Percent: 6.1 %
Brady Statistic AP VS Percent: 1 %
Brady Statistic AS VP Percent: 93 %
Brady Statistic AS VS Percent: 1 %
Brady Statistic RA Percent Paced: 5.4 %
Brady Statistic RV Percent Paced: 99 %
Date Time Interrogation Session: 20241216020018
Implantable Lead Connection Status: 753985
Implantable Lead Connection Status: 753985
Implantable Lead Implant Date: 20221219
Implantable Lead Implant Date: 20221219
Implantable Lead Location: 753859
Implantable Lead Location: 753860
Implantable Pulse Generator Implant Date: 20221219
Lead Channel Impedance Value: 410 Ohm
Lead Channel Impedance Value: 450 Ohm
Lead Channel Pacing Threshold Amplitude: 0.375 V
Lead Channel Pacing Threshold Amplitude: 0.75 V
Lead Channel Pacing Threshold Pulse Width: 0.5 ms
Lead Channel Pacing Threshold Pulse Width: 0.5 ms
Lead Channel Sensing Intrinsic Amplitude: 12 mV
Lead Channel Sensing Intrinsic Amplitude: 2.4 mV
Lead Channel Setting Pacing Amplitude: 1 V
Lead Channel Setting Pacing Amplitude: 1.375
Lead Channel Setting Pacing Pulse Width: 0.5 ms
Lead Channel Setting Sensing Sensitivity: 2 mV
Pulse Gen Model: 2272
Pulse Gen Serial Number: 3985814

## 2023-02-13 ENCOUNTER — Other Ambulatory Visit: Payer: Self-pay | Admitting: Hematology

## 2023-02-13 DIAGNOSIS — C183 Malignant neoplasm of hepatic flexure: Secondary | ICD-10-CM

## 2023-02-13 NOTE — Telephone Encounter (Signed)
Refilled amlodipine per standing orders.

## 2023-02-20 DIAGNOSIS — E785 Hyperlipidemia, unspecified: Secondary | ICD-10-CM | POA: Diagnosis not present

## 2023-02-20 DIAGNOSIS — R7301 Impaired fasting glucose: Secondary | ICD-10-CM | POA: Diagnosis not present

## 2023-02-20 DIAGNOSIS — I10 Essential (primary) hypertension: Secondary | ICD-10-CM | POA: Diagnosis not present

## 2023-02-24 DIAGNOSIS — E785 Hyperlipidemia, unspecified: Secondary | ICD-10-CM | POA: Diagnosis not present

## 2023-02-24 DIAGNOSIS — R748 Abnormal levels of other serum enzymes: Secondary | ICD-10-CM | POA: Diagnosis not present

## 2023-02-24 DIAGNOSIS — F17200 Nicotine dependence, unspecified, uncomplicated: Secondary | ICD-10-CM | POA: Diagnosis not present

## 2023-02-24 DIAGNOSIS — R7301 Impaired fasting glucose: Secondary | ICD-10-CM | POA: Diagnosis not present

## 2023-02-24 DIAGNOSIS — R809 Proteinuria, unspecified: Secondary | ICD-10-CM | POA: Diagnosis not present

## 2023-02-24 DIAGNOSIS — N1831 Chronic kidney disease, stage 3a: Secondary | ICD-10-CM | POA: Diagnosis not present

## 2023-02-24 DIAGNOSIS — E875 Hyperkalemia: Secondary | ICD-10-CM | POA: Diagnosis not present

## 2023-02-24 DIAGNOSIS — I129 Hypertensive chronic kidney disease with stage 1 through stage 4 chronic kidney disease, or unspecified chronic kidney disease: Secondary | ICD-10-CM | POA: Diagnosis not present

## 2023-02-24 DIAGNOSIS — I10 Essential (primary) hypertension: Secondary | ICD-10-CM | POA: Diagnosis not present

## 2023-02-24 DIAGNOSIS — C185 Malignant neoplasm of splenic flexure: Secondary | ICD-10-CM | POA: Diagnosis not present

## 2023-02-24 DIAGNOSIS — Z950 Presence of cardiac pacemaker: Secondary | ICD-10-CM | POA: Diagnosis not present

## 2023-02-24 DIAGNOSIS — I714 Abdominal aortic aneurysm, without rupture, unspecified: Secondary | ICD-10-CM | POA: Diagnosis not present

## 2023-02-28 ENCOUNTER — Inpatient Hospital Stay: Payer: Medicare Other | Attending: Hematology

## 2023-02-28 VITALS — BP 170/92 | HR 98 | Temp 97.8°F | Resp 19

## 2023-02-28 DIAGNOSIS — D631 Anemia in chronic kidney disease: Secondary | ICD-10-CM | POA: Insufficient documentation

## 2023-02-28 DIAGNOSIS — Z85038 Personal history of other malignant neoplasm of large intestine: Secondary | ICD-10-CM | POA: Insufficient documentation

## 2023-02-28 DIAGNOSIS — Z95828 Presence of other vascular implants and grafts: Secondary | ICD-10-CM

## 2023-02-28 DIAGNOSIS — Z452 Encounter for adjustment and management of vascular access device: Secondary | ICD-10-CM | POA: Diagnosis not present

## 2023-02-28 DIAGNOSIS — N189 Chronic kidney disease, unspecified: Secondary | ICD-10-CM | POA: Insufficient documentation

## 2023-02-28 DIAGNOSIS — D509 Iron deficiency anemia, unspecified: Secondary | ICD-10-CM

## 2023-02-28 DIAGNOSIS — Z08 Encounter for follow-up examination after completed treatment for malignant neoplasm: Secondary | ICD-10-CM | POA: Diagnosis not present

## 2023-02-28 DIAGNOSIS — G629 Polyneuropathy, unspecified: Secondary | ICD-10-CM | POA: Diagnosis not present

## 2023-02-28 DIAGNOSIS — C183 Malignant neoplasm of hepatic flexure: Secondary | ICD-10-CM

## 2023-02-28 LAB — COMPREHENSIVE METABOLIC PANEL
ALT: 41 U/L (ref 0–44)
AST: 83 U/L — ABNORMAL HIGH (ref 15–41)
Albumin: 4 g/dL (ref 3.5–5.0)
Alkaline Phosphatase: 105 U/L (ref 38–126)
Anion gap: 9 (ref 5–15)
BUN: 26 mg/dL — ABNORMAL HIGH (ref 8–23)
CO2: 18 mmol/L — ABNORMAL LOW (ref 22–32)
Calcium: 9.3 mg/dL (ref 8.9–10.3)
Chloride: 109 mmol/L (ref 98–111)
Creatinine, Ser: 1.73 mg/dL — ABNORMAL HIGH (ref 0.61–1.24)
GFR, Estimated: 41 mL/min — ABNORMAL LOW (ref >60)
Glucose, Bld: 112 mg/dL — ABNORMAL HIGH (ref 70–99)
Potassium: 4.5 mmol/L (ref 3.5–5.1)
Sodium: 136 mmol/L (ref 135–145)
Total Bilirubin: 1.1 mg/dL (ref 0.0–1.2)
Total Protein: 8.1 g/dL (ref 6.5–8.1)

## 2023-02-28 LAB — CBC WITH DIFFERENTIAL/PLATELET
Abs Immature Granulocytes: 0.03 10*3/uL (ref 0.00–0.07)
Basophils Absolute: 0 10*3/uL (ref 0.0–0.1)
Basophils Relative: 1 %
Eosinophils Absolute: 0.1 10*3/uL (ref 0.0–0.5)
Eosinophils Relative: 1 %
HCT: 38.2 % — ABNORMAL LOW (ref 39.0–52.0)
Hemoglobin: 12.8 g/dL — ABNORMAL LOW (ref 13.0–17.0)
Immature Granulocytes: 0 %
Lymphocytes Relative: 29 %
Lymphs Abs: 2.2 10*3/uL (ref 0.7–4.0)
MCH: 34.1 pg — ABNORMAL HIGH (ref 26.0–34.0)
MCHC: 33.5 g/dL (ref 30.0–36.0)
MCV: 101.9 fL — ABNORMAL HIGH (ref 80.0–100.0)
Monocytes Absolute: 0.7 10*3/uL (ref 0.1–1.0)
Monocytes Relative: 9 %
Neutro Abs: 4.6 10*3/uL (ref 1.7–7.7)
Neutrophils Relative %: 60 %
Platelets: 160 10*3/uL (ref 150–400)
RBC: 3.75 MIL/uL — ABNORMAL LOW (ref 4.22–5.81)
RDW: 13.9 % (ref 11.5–15.5)
WBC: 7.6 10*3/uL (ref 4.0–10.5)
nRBC: 0 % (ref 0.0–0.2)

## 2023-02-28 LAB — IRON AND TIBC
Iron: 86 ug/dL (ref 45–182)
Saturation Ratios: 23 % (ref 17.9–39.5)
TIBC: 382 ug/dL (ref 250–450)
UIBC: 296 ug/dL

## 2023-02-28 LAB — FERRITIN: Ferritin: 439 ng/mL — ABNORMAL HIGH (ref 24–336)

## 2023-02-28 MED ORDER — HEPARIN SOD (PORK) LOCK FLUSH 100 UNIT/ML IV SOLN
500.0000 [IU] | Freq: Once | INTRAVENOUS | Status: AC
  Administered 2023-02-28: 500 [IU] via INTRAVENOUS

## 2023-02-28 MED ORDER — SODIUM CHLORIDE FLUSH 0.9 % IV SOLN
10.0000 mL | Freq: Once | INTRAVENOUS | Status: AC
Start: 2023-02-28 — End: 2023-02-28
  Administered 2023-02-28: 10 mL via INTRAVENOUS
  Filled 2023-02-28: qty 10

## 2023-02-28 NOTE — Patient Instructions (Signed)
 [facility] [place] - A DEPT OF [facility]  Discharge Instructions: Thank you for choosing [facility] to provide your oncology and hematology care.  If you have a lab appointment with the Cancer Center - please note that after April 8th, 2024, all labs will be drawn in the cancer center.  You do not have to check in or register with the main entrance as you have in the past but will complete your check-in in the cancer center.  Wear comfortable clothing and clothing appropriate for easy access to any Portacath or PICC line.   We strive to give you quality time with your provider. You may need to reschedule your appointment if you arrive late (15 or more minutes).  Arriving late affects you and other patients whose appointments are after yours.  Also, if you miss three or more appointments without notifying the office, you may be dismissed from the clinic at the provider's discretion.      For prescription refill requests, have your pharmacy contact our office and allow 72 hours for refills to be completed.    Today you received the following port flush with labs, return as scheduled.   To help prevent nausea and vomiting after your treatment, we encourage you to take your nausea medication as directed.  BELOW ARE SYMPTOMS THAT SHOULD BE REPORTED IMMEDIATELY: *FEVER GREATER THAN 100.4 F (38 C) OR HIGHER *CHILLS OR SWEATING *NAUSEA AND VOMITING THAT IS NOT CONTROLLED WITH YOUR NAUSEA MEDICATION *UNUSUAL SHORTNESS OF BREATH *UNUSUAL BRUISING OR BLEEDING *URINARY PROBLEMS (pain or burning when urinating, or frequent urination) *BOWEL PROBLEMS (unusual diarrhea, constipation, pain near the anus) TENDERNESS IN MOUTH AND THROAT WITH OR WITHOUT PRESENCE OF ULCERS (sore throat, sores in mouth, or a toothache) UNUSUAL RASH, SWELLING OR PAIN  UNUSUAL VAGINAL DISCHARGE OR ITCHING   Items with * indicate a potential emergency and should be followed up as soon as  possible or go to the Emergency Department if any problems should occur.  Please show the CHEMOTHERAPY ALERT CARD or IMMUNOTHERAPY ALERT CARD at check-in to the Emergency Department and triage nurse.  Should you have questions after your visit or need to cancel or reschedule your appointment, please contact [facility] [place] - A DEPT OF Eligh[facility] Brideg[facility] [phone]  and follow the prompts.  Office hours are 8:00 a.m. to 4:30 p.m. Monday - Friday. Please note that voicemails left after 4:00 p.m. may not be returned until the following business day.  We are closed weekends and major holidays. You have access to a nurse at all times for urgent questions. Please call the main number to the clinic [phone] and follow the prompts.  For any non-urgent questions, you may also contact your provider using MyChart. We now offer e-Visits for anyone 52 and older to request care online for non-urgent symptoms. For details visit [email or web address].   Also download the MyChart app! Go to the app store, search "MyChart", open the app, select [place], and log in with your MyChart username and password.

## 2023-02-28 NOTE — Progress Notes (Signed)
 Port flushed with good blood return noted. No bruising or swelling at site. Bandaid applied and patient discharged in satisfactory condition. VVS stable with no signs or symptoms of distressed noted.

## 2023-03-01 LAB — CEA: CEA: 5.4 ng/mL — ABNORMAL HIGH (ref 0.0–4.7)

## 2023-03-07 ENCOUNTER — Inpatient Hospital Stay: Payer: Medicare Other | Admitting: Hematology

## 2023-03-07 VITALS — BP 139/78 | HR 75 | Temp 96.6°F | Resp 18 | Wt 168.9 lb

## 2023-03-07 DIAGNOSIS — C183 Malignant neoplasm of hepatic flexure: Secondary | ICD-10-CM

## 2023-03-07 DIAGNOSIS — Z08 Encounter for follow-up examination after completed treatment for malignant neoplasm: Secondary | ICD-10-CM | POA: Diagnosis not present

## 2023-03-07 DIAGNOSIS — Z452 Encounter for adjustment and management of vascular access device: Secondary | ICD-10-CM | POA: Diagnosis not present

## 2023-03-07 DIAGNOSIS — N189 Chronic kidney disease, unspecified: Secondary | ICD-10-CM | POA: Diagnosis not present

## 2023-03-07 DIAGNOSIS — D509 Iron deficiency anemia, unspecified: Secondary | ICD-10-CM

## 2023-03-07 DIAGNOSIS — D631 Anemia in chronic kidney disease: Secondary | ICD-10-CM | POA: Diagnosis not present

## 2023-03-07 DIAGNOSIS — Z85038 Personal history of other malignant neoplasm of large intestine: Secondary | ICD-10-CM | POA: Diagnosis not present

## 2023-03-07 DIAGNOSIS — G629 Polyneuropathy, unspecified: Secondary | ICD-10-CM | POA: Diagnosis not present

## 2023-03-07 NOTE — Progress Notes (Signed)
 [facility] [address], [place] [address]    Clinic Day:  03/07/23   Referring physician: Aura Portal, FNP  Patient Care Team: Aura Portal, FNP (Inactive) as PCP - General (Family Medicine) Debera Jayson MATSU, MD as PCP - Cardiology (Cardiology) Rogers Hai, MD as Medical Oncologist (Medical Oncology) Celestia Joesph SQUIBB, RN as Oncology Nurse Navigator (Medical Oncology)   ASSESSMENT & PLAN:   Assessment: 1. 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 - Colonoscopy on 03/12/2021: Fungating partially obstructing large mass found at 40 cm proximal to the anus, circumferential with no bleeding present. - CEA on 03/12/2021: 8.3 - CT angio abdomen and pelvis on 04/08/2021: No evidence of metastatic lymphadenopathy or liver mets.  Stable 7 mm subpleural pulmonary nodule in the periphery of the right lower lobe. - CT angio CAP on 02/04/2021: No evidence of lung metastasis, abdominal metastasis. - Resection of splenic flexure mass on 04/23/2021 by Dr. Debby. - Pathology: Moderately differentiated adenocarcinoma, invades into subserosa, margins negative, 0/38 lymph nodes.  No LVI or perineural invasion.  PT3 pN0.  MSI-stable. - Guardant reveal (05/21/2021): CT DNA was detected. - PET scan on 06/10/2021 shows 2 cm hypermetabolic soft tissue density in the transverse colon.  No evidence of local or distant metastatic disease.   2. Social/family history: - He is a retired journalist, newspaper. - Current active smoker, half pack per day for 50 years. - Mother had colon cancer at age 29.  Maternal grandmother had colon cancer.  3.  Stage III (U5JW8) right colon adenocarcinoma: - Right colectomy on 08/10/2021 by Dr. Debby. - Pathology with grade 2 moderately differentiated colonic adenocarcinoma, tumor site ascending colon, margins negative, 1/22 lymph nodes involved, [id number].  MMR preserved.   - 12 cycles of FOLFOX from  10/05/2021 through 03/23/2022    Plan: 1. Stage III (T4AN1) right colon adenocarcinoma, MMR preserved: - He denies any change in bowel habits.  No bleeding per rectum or melena. - Last CT scan in September 2024 with no evidence of recurrence. - Labs from 02/28/2023: AST elevated at 83.  Rest of LFTs are normal.  Creatinine stable at 1.7.  CEA is 5.4 and at his baseline. - Recommend follow-up in 3 months with repeat CEA and CTAP with oral contrast.  2.  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 - Latest ferritin is 439 with percent saturation of 23.  Hemoglobin is 12.8.  No intervention needed.  3.  Peripheral neuropathy: - Constant tingling in the fingertips and occasional tingling in the toes from oxaliplatin  is stable.  No neuropathic pains.      Orders Placed This Encounter  Procedures   CT ABDOMEN PELVIS WO CONTRAST    Standing Status:   Future    Expected Date:   06/05/2023    Expiration Date:   03/06/2024    Preferred imaging location?:   [facility]    If indicated for the ordered procedure, I authorize the administration of oral contrast media per Radiology protocol:   Yes    Does the patient have a contrast media/X-ray dye allergy?:   No   CBC with Differential    Standing Status:   Future    Expected Date:   05/29/2023    Expiration Date:   03/06/2024   Comprehensive metabolic panel    Standing Status:   Future    Expected Date:   05/29/2023  Expiration Date:   03/06/2024   CEA    Standing Status:   Future    Expected Date:   05/29/2023    Expiration Date:   03/06/2024   Iron and TIBC ([facility] ONLY)    Standing Status:   Future    Expected Date:   05/29/2023    Expiration Date:   03/06/2024   Ferritin    Standing Status:   Future    Expected Date:   05/29/2023    Expiration Date:   03/06/2024      LILLETTE Hummingbird R Teague,acting as a scribe for Alean Stands, MD.,have documented all relevant documentation on  the behalf of Alean Stands, MD,as directed by  Alean Stands, MD while in the presence of Alean Stands, MD.  I, Alean Stands MD, have reviewed the above documentation for accuracy and completeness, and I agree with the above.     Alean Stands, MD   1/14/20252:52 PM  CHIEF COMPLAINT:   Diagnosis: stage 3B right colon cancer    Cancer Staging  Cancer of left colon [facility]) Staging form: Colon and Rectum, AJCC 8th Edition - Clinical stage from 05/11/2021: Stage IIA (cT3, cN0, cM0) - Unsigned  Primary cancer of hepatic flexure s/p robotic right colectomy 08/18/2021 Staging form: Colon and Rectum, AJCC 8th Edition - Clinical stage from 09/23/2021: Stage IIIB (cT4a, cN1a, cM0) - Unsigned    Prior Therapy: 1. Resection of splenic flexure mass on 04/23/2021 by Dr. Debby 2. right colectomy on 08/18/2021  3. Adjuvant FOLFOX, 12 cycles, 10/05/21 - 03/23/22  Current Therapy:  surveillance   HISTORY OF PRESENT ILLNESS:   Oncology History  Primary cancer of hepatic flexure s/p robotic right colectomy 08/18/2021  08/18/2021 Initial Diagnosis   Primary cancer of hepatic flexure s/p robotic right colectomy 08/18/2021   10/05/2021 - 10/21/2021 Chemotherapy   Patient is on Treatment Plan : COLORECTAL FOLFOX q14d x 6 months     10/05/2021 -  Chemotherapy   Patient is on Treatment Plan : COLORECTAL FOLFOX q14d x 6 months        INTERVAL HISTORY:   Vinicius is a 73 y.o. male presenting to clinic today for follow up of stage 3B right colon cancer. He was last seen by me on 11/22/22.  Today, he states that he is doing well overall. His appetite level is at 50%. His energy level is at 50%. He is accompanied by his wife. He reports normal BM's and denies any BRBPR or melena. Numbness in toes an fingertips is slightly improved from his last visit. He notes appetite comes and goes. He denies any abdominal pain and is not taking any oral iron.   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 - St Jude/[employer] MRI 2272 dual chamber PPM   Tuberculosis    as a child, was treated    Surgical History: Past Surgical History:  Procedure Laterality Date   ABDOMINAL AORTIC ENDOVASCULAR STENT GRAFT Bilateral 02/26/2021   Procedure: ABDOMINAL AORTIC ENDOVASCULAR STENT GRAFT REPAIR;  Surgeon: Gretta Lonni PARAS, MD;  Location: [facility] OR;  Service: Vascular;  Laterality: Bilateral;   BIOPSY  03/12/2021   Procedure: BIOPSY;  Surgeon: Eartha Angelia Sieving, MD;  [facility];  Service: Gastroenterology;;   BIOPSY  06/25/2021   Procedure: BIOPSY;  Surgeon: Eartha Angelia Sieving, MD;  [facility]: [facility];  Service: Gastroenterology;;  mass    COLONOSCOPY WITH PROPOFOL  N/A 03/12/2021   Procedure: COLONOSCOPY WITH PROPOFOL ;  Surgeon: Eartha Angelia Sieving, MD;  [facility];  Service: Gastroenterology;  Laterality: N/A;  10:55   COLONOSCOPY WITH PROPOFOL  N/A 06/25/2021   Procedure: COLONOSCOPY WITH PROPOFOL ;  Surgeon: Eartha Angelia Sieving, MD;  [facility];  Service: Gastroenterology;  Laterality: N/A;  235 ASA 2   HEMOSTASIS CLIP PLACEMENT  03/12/2021   Procedure: HEMOSTASIS CLIP PLACEMENT;  Surgeon: Eartha Angelia Sieving, MD;  [facility];  Service: Gastroenterology;;   HEMOSTASIS CLIP PLACEMENT  06/25/2021   Procedure: HEMOSTASIS CLIP PLACEMENT;  Surgeon: Eartha Angelia Sieving, MD;  [facility];  Service: Gastroenterology;;   HERNIA REPAIR Left 10/25/2006   IR IMAGING GUIDED PORT INSERTION  09/29/2021   PACEMAKER IMPLANT N/A 02/08/2021   Procedure: PACEMAKER  IMPLANT;  Surgeon: Fernande Elspeth BROCKS, MD;  Location: [facility] INVASIVE CV LAB;  Service: Cardiovascular;  Laterality: N/A;   POLYPECTOMY  03/12/2021   Procedure: POLYPECTOMY;  Surgeon: Eartha Angelia Sieving, MD;  [facility];  Service: Gastroenterology;;   POLYPECTOMY  06/25/2021   Procedure: POLYPECTOMY;  Surgeon: Eartha Angelia, Sieving, MD;  [facility];  Service: Gastroenterology;;   MATIAS  03/12/2021   Procedure: MATIAS;  Surgeon: Eartha Angelia, Sieving, MD;  [facility];  Service: Gastroenterology;;    Social History: Social History   Socioeconomic History   Marital status: Single    Spouse name: Not on file   Number of children: Not on file   Years of education: Not on file   Highest education level: Not on file  Occupational History   Not on file  Tobacco Use   Smoking status: Every Day    Current packs/day: 0.75    Average packs/day: 0.8 packs/day for 50.0 years (37.5 ttl pk-yrs)    Types: Cigarettes    Passive exposure: Current   Smokeless tobacco: Never  Vaping Use   Vaping status: Never Used  Substance and Sexual Activity   Alcohol use: Yes    Alcohol/week: 2.0 - 3.0 standard drinks of alcohol    Types: 2 - 3 Shots of liquor per week    Comment: daily liquor moderatly   Drug use: Not on file    Comment: uses every other day   Sexual activity: Not Currently  Other Topics Concern   Not on file  Social History Narrative   Not on file   Social Drivers of Health   Financial Resource Strain: High Risk (10/14/2021)   Overall Financial Resource Strain (CARDIA)    Difficulty of Paying Living Expenses: Hard  Food Insecurity: Not on file  Transportation Needs: Not on file  Physical Activity: Not on file  Stress: Not on file  Social Connections: Not on file  Intimate Partner Violence: Not on file    Family History: Family History  Problem Relation Age of Onset   Heart disease Father    Colon polyps Sister         less than 10 lifetime polyps   Colon cancer Maternal Grandmother        dx 90s    Current Medications:  Current Outpatient Medications:  albuterol  (VENTOLIN  HFA) 108 (90 Base) MCG/ACT inhaler, Inhale 2 puffs into the lungs every 6 (six) hours as needed for wheezing or shortness of breath., Disp: 8 g, Rfl: 2   amLODipine  (NORVASC ) 2.5 MG tablet, TAKE 1 TABLET(2.5 MG) BY MOUTH DAILY, Disp: 90 tablet, Rfl: 3   aspirin  EC 81 MG EC tablet, Take 1 tablet (81 mg total) by mouth daily at 6 (six) AM. Swallow whole., Disp: 30 tablet, Rfl: 11   atorvastatin  (LIPITOR) 10 MG tablet, Take 10 mg by mouth daily., Disp: , Rfl:    bisacodyl  (DULCOLAX) 5 MG EC tablet, Take 5 mg by mouth daily as needed for moderate constipation., Disp: , Rfl:    carvedilol  (COREG ) 6.25 MG tablet, Take 1 tablet (6.25 mg total) by mouth 2 (two) times daily with a meal., Disp: 60 tablet, Rfl: 3   famotidine  (PEPCID ) 20 MG tablet, Take 20 mg by mouth daily as needed for heartburn or indigestion., Disp: , Rfl:    feeding supplement (ENSURE ENLIVE / ENSURE PLUS) LIQD, Take 237 mLs by mouth 3 (three) times daily between meals. (Patient taking differently: Take 237 mLs by mouth 3 (three) times daily as needed (nutrition).), Disp: 237 mL, Rfl: 12   lidocaine -prilocaine  (EMLA ) cream, SMARTSIG:1 Application Topical PRN, Disp: , Rfl:    loperamide  (IMODIUM  A-D) 2 MG tablet, Take 1 tablet (2 mg total) by mouth 4 (four) times daily as needed for diarrhea or loose stools., Disp: , Rfl: 0   nicotine  (NICODERM CQ  - DOSED IN MG/24 HOURS) 21 mg/24hr patch, Place 21 mg onto the skin daily., Disp: , Rfl:    olmesartan (BENICAR) 5 MG tablet, Take 1 tablet by mouth daily., Disp: , Rfl:    prochlorperazine  (COMPAZINE ) 10 MG tablet, , Disp: , Rfl:    sodium bicarbonate 650 MG tablet, Take 650 mg by mouth 3 (three) times daily., Disp: , Rfl:    Vitamin D, Ergocalciferol, (DRISDOL) 1.25 MG (50000 UNIT) CAPS capsule, Take 50,000 Units by mouth once a  week., Disp: , Rfl:  No current facility-administered medications for this visit.  Facility-Administered Medications Ordered in Other Visits:    magnesium  sulfate 2 GM/50ML IVPB, , , ,    palonosetron  (ALOXI ) 0.25 MG/5ML injection, , , ,    sodium chloride  flush (NS) 0.9 % injection 10 mL, 10 mL, Intracatheter, PRN, Donjuan Robison, MD, 10 mL at 11/18/21 1257   Allergies: Allergies  Allergen Reactions   Lisinopril Anaphylaxis   Naproxen Shortness Of Breath and Palpitations   Nsaids Shortness Of Breath and Palpitations    REVIEW OF SYSTEMS:   Review of Systems  Constitutional:  Negative for chills, fatigue and fever.  HENT:   Negative for lump/mass, mouth sores, nosebleeds, sore throat and trouble swallowing.   Eyes:  Negative for eye problems.  Respiratory:  Negative for cough and shortness of breath.   Cardiovascular:  Negative for chest pain, leg swelling and palpitations.  Gastrointestinal:  Negative for abdominal pain, constipation, diarrhea, nausea and vomiting.  Genitourinary:  Negative for bladder incontinence, difficulty urinating, dysuria, frequency, hematuria and nocturia.   Musculoskeletal:  Negative for arthralgias, back pain, flank pain, myalgias and neck pain.  Skin:  Negative for itching and rash.  Neurological:  Positive for numbness (in fingers and toes). Negative for dizziness and headaches.  Hematological:  Does not bruise/bleed easily.  Psychiatric/Behavioral:  Negative for depression, sleep disturbance and suicidal ideas. The patient is not nervous/anxious.   All other systems reviewed and are negative.  VITALS:   Blood pressure 139/78, pulse 75, temperature (!) 96.6 F (35.9 C), temperature source Tympanic, resp. rate 18, weight 168 lb 14 oz (76.6 kg), SpO2 100%.  Wt Readings from Last 3 Encounters:  03/07/23 168 lb 14 oz (76.6 kg)  12/27/22 166 lb 6.4 oz (75.5 kg)  11/22/22 167 lb 9.6 oz (76 kg)    Body mass index is 3,298.06  kg/m.  Performance status (ECOG): 1 - Symptomatic but completely ambulatory  PHYSICAL EXAM:   Physical Exam Vitals and nursing note reviewed. Exam conducted with a chaperone present.  Constitutional:      Appearance: Normal appearance.  Cardiovascular:     Rate and Rhythm: Normal rate and regular rhythm.     Pulses: Normal pulses.     Heart sounds: Normal heart sounds.  Pulmonary:     Effort: Pulmonary effort is normal.     Breath sounds: Normal breath sounds.  Abdominal:     Palpations: Abdomen is soft. There is no hepatomegaly, splenomegaly or mass.     Tenderness: There is no abdominal tenderness.  Musculoskeletal:     Right lower leg: No edema.     Left lower leg: No edema.  Lymphadenopathy:     Cervical: No cervical adenopathy.     Right cervical: No superficial, deep or posterior cervical adenopathy.    Left cervical: No superficial, deep or posterior cervical adenopathy.     Upper Body:     Right upper body: No supraclavicular or axillary adenopathy.     Left upper body: No supraclavicular or axillary adenopathy.  Neurological:     General: No focal deficit present.     Mental Status: He is alert and oriented to person, place, and time.  Psychiatric:        Mood and Affect: Mood normal.        Behavior: Behavior normal.     LABS:      Latest Ref Rng & Units 02/28/2023   11:29 AM 11/09/2022    1:21 PM 08/01/2022    1:45 PM  CBC  WBC 4.0 - 10.5 K/uL 7.6  5.5  5.2   Hemoglobin 13.0 - 17.0 g/dL 87.1  87.6  86.8   Hematocrit 39.0 - 52.0 % 38.2  37.8  39.9   Platelets 150 - 400 K/uL 160  194  179       Latest Ref Rng & Units 02/28/2023   11:29 AM 11/09/2022    1:21 PM 08/01/2022    1:45 PM  CMP  Glucose 70 - 99 mg/dL 887  80  898   BUN 8 - 23 mg/dL 26  33  26   Creatinine 0.61 - 1.24 mg/dL 8.26  7.95  7.89   Sodium 135 - 145 mmol/L 136  134  139   Potassium 3.5 - 5.1 mmol/L 4.5  5.5  4.5   Chloride 98 - 111 mmol/L 109  108  110   CO2 22 - 32 mmol/L 18  19  22     Calcium  8.9 - 10.3 mg/dL 9.3  8.9  9.2   Total Protein 6.5 - 8.1 g/dL 8.1  7.5  7.8   Total Bilirubin 0.0 - 1.2 mg/dL 1.1  0.5  0.4   Alkaline Phos 38 - 126 U/L 105  89  110   AST 15 - 41 U/L 83  24  22   ALT 0 - 44 U/L 41  18  14      Lab Results  Component  Value Date   CEA1 5.4 (H) 02/28/2023   /  CEA  Date Value Ref Range Status  02/28/2023 5.4 (H) 0.0 - 4.7 ng/mL Final    Comment:    (NOTE)                             Nonsmokers          <3.9                             Smokers             <5.6 Roche [employer] Electrochemiluminescence Immunoassay (ECLIA) Values obtained with different assay methods or kits cannot be used interchangeably.  Results cannot be interpreted as absolute evidence of the presence or absence of malignant disease. Performed At: [facility] [address] [place], [place] [id number] Jennette Shorter MD Ey:[id number]    No results found for: PSA1 No results found for: RJW800 No results found for: CAN125  No results found for: STEPHANY CARLOTA BENSON MARKEL EARLA JOANNIE DOC, MSPIKE, SPEI Lab Results  Component Value Date   TIBC 382 02/28/2023   TIBC 342 11/09/2022   TIBC 347 08/01/2022   FERRITIN 439 (H) 02/28/2023   FERRITIN 260 11/09/2022   FERRITIN 130 08/01/2022   IRONPCTSAT 23 02/28/2023   IRONPCTSAT 35 11/09/2022   IRONPCTSAT 16 (L) 08/01/2022   Lab Results  Component Value Date   LDH 141 05/11/2021     STUDIES:   CUP PACEART REMOTE DEVICE CHECK Result Date: 02/06/2023 Scheduled remote reviewed. Normal device function.  16 AMS, longest 3 min, 44 sec, EGMs c/w PAT. Next remote 91 days. [place], CVRS

## 2023-03-07 NOTE — Patient Instructions (Signed)
 [facility] at [facility] Discharge Instructions   You were seen and examined today by Dr. Ellin Saba.  He reviewed the results of your lab work which are normal/stable.   We will see you back in 3 months. We will repeat lab work and a CT scan prior to this visit.  Return as scheduled.    Thank you for choosing [facility] at [facility] to provide your oncology and hematology care.  To afford each patient quality time with our provider, please arrive at least 15 minutes before your scheduled appointment time.   If you have a lab appointment with the Cancer Center please come in thru the Main Entrance and check in at the main information desk.  You need to re-schedule your appointment should you arrive 10 or more minutes late.  We strive to give you quality time with our providers, and arriving late affects you and other patients whose appointments are after yours.  Also, if you no show three or more times for appointments you may be dismissed from the clinic at the providers discretion.     Again, thank you for choosing [facility].  Our hope is that these requests will decrease the amount of time that you wait before being seen by our physicians.       _____________________________________________________________  Should you have questions after your visit to [facility], please contact our office at [phone] and follow the prompts.  Our office hours are 8:00 a.m. and 4:30 p.m. Monday - Friday.  Please note that voicemails left after 4:00 p.m. may not be returned until the following business day.  We are closed weekends and major holidays.  You do have access to a nurse 24-7, just call the main number to the clinic [phone] and do not press any options, hold on the line and a nurse will answer the phone.    For prescription refill requests, have your pharmacy contact our office and allow 72 hours.    Due to  Covid, you will need to wear a mask upon entering the hospital. If you do not have a mask, a mask will be given to you at the Main Entrance upon arrival. For doctor visits, patients may have 1 support person age 9 or older with them. For treatment visits, patients can not have anyone with them due to social distancing guidelines and our immunocompromised population.

## 2023-03-13 NOTE — Progress Notes (Signed)
Remote pacemaker transmission.   

## 2023-03-22 DIAGNOSIS — E211 Secondary hyperparathyroidism, not elsewhere classified: Secondary | ICD-10-CM | POA: Diagnosis not present

## 2023-03-22 DIAGNOSIS — D631 Anemia in chronic kidney disease: Secondary | ICD-10-CM | POA: Diagnosis not present

## 2023-03-22 DIAGNOSIS — R809 Proteinuria, unspecified: Secondary | ICD-10-CM | POA: Diagnosis not present

## 2023-03-22 DIAGNOSIS — D649 Anemia, unspecified: Secondary | ICD-10-CM | POA: Diagnosis not present

## 2023-03-22 DIAGNOSIS — N189 Chronic kidney disease, unspecified: Secondary | ICD-10-CM | POA: Diagnosis not present

## 2023-03-29 DIAGNOSIS — N1832 Chronic kidney disease, stage 3b: Secondary | ICD-10-CM | POA: Diagnosis not present

## 2023-03-29 DIAGNOSIS — I129 Hypertensive chronic kidney disease with stage 1 through stage 4 chronic kidney disease, or unspecified chronic kidney disease: Secondary | ICD-10-CM | POA: Diagnosis not present

## 2023-03-29 DIAGNOSIS — R809 Proteinuria, unspecified: Secondary | ICD-10-CM | POA: Diagnosis not present

## 2023-03-29 DIAGNOSIS — E8722 Chronic metabolic acidosis: Secondary | ICD-10-CM | POA: Diagnosis not present

## 2023-05-08 ENCOUNTER — Ambulatory Visit: Payer: Medicare Other

## 2023-05-08 DIAGNOSIS — I442 Atrioventricular block, complete: Secondary | ICD-10-CM

## 2023-05-09 LAB — CUP PACEART REMOTE DEVICE CHECK
Battery Remaining Longevity: 100 mo
Battery Remaining Percentage: 81 %
Battery Voltage: 3.01 V
Brady Statistic AP VP Percent: 5.7 %
Brady Statistic AP VS Percent: 1 %
Brady Statistic AS VP Percent: 93 %
Brady Statistic AS VS Percent: 1 %
Brady Statistic RA Percent Paced: 5 %
Brady Statistic RV Percent Paced: 99 %
Date Time Interrogation Session: 20250317020013
Implantable Lead Connection Status: 753985
Implantable Lead Connection Status: 753985
Implantable Lead Implant Date: 20221219
Implantable Lead Implant Date: 20221219
Implantable Lead Location: 753859
Implantable Lead Location: 753860
Implantable Pulse Generator Implant Date: 20221219
Lead Channel Impedance Value: 410 Ohm
Lead Channel Impedance Value: 430 Ohm
Lead Channel Pacing Threshold Amplitude: 0.5 V
Lead Channel Pacing Threshold Amplitude: 0.625 V
Lead Channel Pacing Threshold Pulse Width: 0.5 ms
Lead Channel Pacing Threshold Pulse Width: 0.5 ms
Lead Channel Sensing Intrinsic Amplitude: 10.9 mV
Lead Channel Sensing Intrinsic Amplitude: 2 mV
Lead Channel Setting Pacing Amplitude: 0.875
Lead Channel Setting Pacing Amplitude: 1.5 V
Lead Channel Setting Pacing Pulse Width: 0.5 ms
Lead Channel Setting Sensing Sensitivity: 2 mV
Pulse Gen Model: 2272
Pulse Gen Serial Number: 3985814

## 2023-06-05 ENCOUNTER — Ambulatory Visit (HOSPITAL_COMMUNITY)
Admission: RE | Admit: 2023-06-05 | Discharge: 2023-06-05 | Disposition: A | Discharge status: Home / Self Care | Payer: Medicare Other | Source: Ambulatory Visit | Attending: Hematology | Admitting: Hematology

## 2023-06-05 ENCOUNTER — Inpatient Hospital Stay: Payer: Medicare Other | Attending: Hematology

## 2023-06-05 VITALS — BP 137/71 | HR 64 | Temp 97.5°F | Resp 18

## 2023-06-05 DIAGNOSIS — C183 Malignant neoplasm of hepatic flexure: Secondary | ICD-10-CM

## 2023-06-05 DIAGNOSIS — Z85038 Personal history of other malignant neoplasm of large intestine: Secondary | ICD-10-CM | POA: Insufficient documentation

## 2023-06-05 DIAGNOSIS — D509 Iron deficiency anemia, unspecified: Secondary | ICD-10-CM

## 2023-06-05 DIAGNOSIS — G629 Polyneuropathy, unspecified: Secondary | ICD-10-CM | POA: Insufficient documentation

## 2023-06-05 DIAGNOSIS — D631 Anemia in chronic kidney disease: Secondary | ICD-10-CM | POA: Insufficient documentation

## 2023-06-05 DIAGNOSIS — Z95828 Presence of other vascular implants and grafts: Secondary | ICD-10-CM

## 2023-06-05 DIAGNOSIS — Z08 Encounter for follow-up examination after completed treatment for malignant neoplasm: Secondary | ICD-10-CM | POA: Insufficient documentation

## 2023-06-05 DIAGNOSIS — N189 Chronic kidney disease, unspecified: Secondary | ICD-10-CM | POA: Insufficient documentation

## 2023-06-05 LAB — COMPREHENSIVE METABOLIC PANEL WITH GFR
ALT: 25 U/L (ref 0–44)
AST: 41 U/L (ref 15–41)
Albumin: 3.4 g/dL — ABNORMAL LOW (ref 3.5–5.0)
Alkaline Phosphatase: 101 U/L (ref 38–126)
Anion gap: 10 (ref 5–15)
BUN: 18 mg/dL (ref 8–23)
CO2: 20 mmol/L — ABNORMAL LOW (ref 22–32)
Calcium: 9.3 mg/dL (ref 8.9–10.3)
Chloride: 105 mmol/L (ref 98–111)
Creatinine, Ser: 1.61 mg/dL — ABNORMAL HIGH (ref 0.61–1.24)
GFR, Estimated: 45 mL/min — ABNORMAL LOW (ref >60)
Glucose, Bld: 107 mg/dL — ABNORMAL HIGH (ref 70–99)
Potassium: 4.2 mmol/L (ref 3.5–5.1)
Sodium: 135 mmol/L (ref 135–145)
Total Bilirubin: 0.8 mg/dL (ref 0.0–1.2)
Total Protein: 7.3 g/dL (ref 6.5–8.1)

## 2023-06-05 LAB — CBC WITH DIFFERENTIAL/PLATELET
Abs Immature Granulocytes: 0.01 10*3/uL (ref 0.00–0.07)
Basophils Absolute: 0 10*3/uL (ref 0.0–0.1)
Basophils Relative: 1 %
Eosinophils Absolute: 0.1 10*3/uL (ref 0.0–0.5)
Eosinophils Relative: 3 %
HCT: 36.4 % — ABNORMAL LOW (ref 39.0–52.0)
Hemoglobin: 12.4 g/dL — ABNORMAL LOW (ref 13.0–17.0)
Immature Granulocytes: 0 %
Lymphocytes Relative: 32 %
Lymphs Abs: 1.5 10*3/uL (ref 0.7–4.0)
MCH: 34.3 pg — ABNORMAL HIGH (ref 26.0–34.0)
MCHC: 34.1 g/dL (ref 30.0–36.0)
MCV: 100.8 fL — ABNORMAL HIGH (ref 80.0–100.0)
Monocytes Absolute: 0.6 10*3/uL (ref 0.1–1.0)
Monocytes Relative: 14 %
Neutro Abs: 2.3 10*3/uL (ref 1.7–7.7)
Neutrophils Relative %: 50 %
Platelets: 152 10*3/uL (ref 150–400)
RBC: 3.61 MIL/uL — ABNORMAL LOW (ref 4.22–5.81)
RDW: 13.6 % (ref 11.5–15.5)
WBC: 4.6 10*3/uL (ref 4.0–10.5)
nRBC: 0 % (ref 0.0–0.2)

## 2023-06-05 LAB — IRON AND TIBC
Iron: 56 ug/dL (ref 45–182)
Saturation Ratios: 17 % — ABNORMAL LOW (ref 17.9–39.5)
TIBC: 338 ug/dL (ref 250–450)
UIBC: 282 ug/dL

## 2023-06-05 LAB — FERRITIN: Ferritin: 270 ng/mL (ref 24–336)

## 2023-06-05 MED ORDER — IOHEXOL 9 MG/ML PO SOLN
ORAL | Status: AC
  Filled 2023-06-05: qty 1000

## 2023-06-05 MED ORDER — HEPARIN SOD (PORK) LOCK FLUSH 100 UNIT/ML IV SOLN: 500.0000 [IU] | Freq: Once | INTRAVENOUS | Status: DC

## 2023-06-05 MED ORDER — IOHEXOL 9 MG/ML PO SOLN
500.0000 mL | ORAL | Status: AC
  Administered 2023-06-05: 500 mL via ORAL

## 2023-06-05 MED ORDER — HEPARIN SOD (PORK) LOCK FLUSH 100 UNIT/ML IV SOLN
500.0000 [IU] | Freq: Once | INTRAVENOUS | Status: AC
  Administered 2023-06-05: 500 [IU] via INTRAVENOUS

## 2023-06-05 MED ORDER — SODIUM CHLORIDE 0.9% FLUSH
10.0000 mL | INTRAVENOUS | Status: DC | PRN
Start: 2023-06-05 — End: 2023-06-05
  Administered 2023-06-05: 10 mL via INTRAVENOUS

## 2023-06-05 NOTE — Progress Notes (Signed)
Patients port flushed without difficulty.  Good blood return noted with no bruising or swelling noted at site.  Patient remains accessed for CT scan.  °

## 2023-06-05 NOTE — Addendum Note (Signed)
 Addended by: Loni Rm on: 06/05/2023 10:50 AM   Modules accepted: Orders

## 2023-06-06 LAB — CEA: CEA: 4.6 ng/mL (ref 0.0–4.7)

## 2023-06-11 NOTE — Progress Notes (Signed)
 [facility] [address] [place] [address]    Clinic Day:  06/12/2023  Referring physician: Newt Barefoot, FNP  Patient Care Team: Newt Barefoot, FNP as PCP - General (Family Medicine) Gerard Knight, MD as PCP - Cardiology (Cardiology) Paulett Boros, MD as Medical Oncologist (Medical Oncology) Gerhard Knuckles, RN as Oncology Nurse Navigator (Medical Oncology)   ASSESSMENT & PLAN:   Assessment: 1. 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 - Colonoscopy on 03/12/2021: Fungating partially obstructing large mass found at 40 cm proximal to the anus, circumferential with no bleeding present. - CEA on 03/12/2021: 8.3 - CT angio abdomen and pelvis on 04/08/2021: No evidence of metastatic lymphadenopathy or liver mets.  Stable 7 mm subpleural pulmonary nodule in the periphery of the right lower lobe. - CT angio CAP on 02/04/2021: No evidence of lung metastasis, abdominal metastasis. - Resection of splenic flexure mass on 04/23/2021 by Dr. Andy Bannister. - Pathology: Moderately differentiated adenocarcinoma, invades into subserosa, margins negative, 0/38 lymph nodes.  No LVI or perineural invasion.  PT3 pN0.  MSI-stable. - Guardant reveal (05/21/2021): CT DNA was detected. - PET scan on 06/10/2021 shows 2 cm hypermetabolic soft tissue density in the transverse colon.  No evidence of local or distant metastatic disease.   2. Social/family history: - He is a retired Journalist, newspaper. - Current active smoker, half pack per day for 50 years. - Mother had colon cancer at age 76.  Maternal grandmother had colon cancer.  3.  Stage III (U0AV4) right colon adenocarcinoma: - Right colectomy on 08/10/2021 by Dr. Andy Bannister. - Pathology with grade 2 moderately differentiated colonic adenocarcinoma, tumor site ascending colon, margins negative, 1/22 lymph nodes involved, [id number].  MMR preserved.   - 12 cycles of FOLFOX from 10/05/2021  through 03/23/2022    Plan: 1. Stage III (T4AN1) right colon adenocarcinoma, MMR preserved: - He denies any change in bowel habits.  No bleeding per rectum or melena. - Reviewed labs from 06/05/2023: Normal LFTs.  Creatinine stable at 1.61.  CBC grossly normal.  CEA is normal at 4.6. - CTAP (06/05/2023): New 5.3 with 3.3 cm low-density lesion in the posterior segment 4 of the left liver.  Differential includes focal fatty deposition.  MRI was recommended.  16 mm exophytic lesion in the interpolar right kidney increased from 12 mm previously. - Patient has a pacemaker and cannot have MRI. - Will obtain CT abdomen with contrast to evaluate the liver lesion as well as the kidney lesion.  Will give fluids on the day of the CT scan.  RTC after CT.  2.  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 - Hemoglobin stable at 12.4.  Ferritin is 270 and percent saturation 17.  No indication for parenteral iron therapy at this time.   3.  Peripheral neuropathy: - Constant tingling in the fingertips and occasional tingling in the toes from oxaliplatin  is stable.  No neuropathic pains.    Orders Placed This Encounter  Procedures   CT ABDOMEN W CONTRAST    Standing Status:   Future    Expected Date:   06/26/2023    Expiration Date:   06/11/2024    If indicated for the ordered procedure, I authorize the administration of contrast media per Radiology protocol:   Yes    Does the patient have a contrast media/X-ray dye allergy?:   No    Preferred imaging location?:   [facility]    If indicated for the ordered procedure, I authorize the administration of oral contrast media per Radiology protocol:   Yes      I,Katie Daubenspeck,acting as a scribe for Paulett Boros, MD.,have documented all relevant documentation on the behalf of Paulett Boros, MD,as directed by  Paulett Boros, MD while in the presence of Paulett Boros, MD.   I, Paulett Boros MD,  have reviewed the above documentation for accuracy and completeness, and I agree with the above.   Paulett Boros, MD   4/21/202511:42 AM  CHIEF COMPLAINT:   Diagnosis: stage 3B right colon cancer    Cancer Staging  Cancer of left colon [facility]) Staging form: Colon and Rectum, AJCC 8th Edition - Clinical stage from 05/11/2021: Stage IIA (cT3, cN0, cM0) - Unsigned  Primary cancer of hepatic flexure s/p robotic right colectomy 08/18/2021 Staging form: Colon and Rectum, AJCC 8th Edition - Clinical stage from 09/23/2021: Stage IIIB (cT4a, cN1a, cM0) - Unsigned    Prior Therapy: 1. Resection of splenic flexure mass on 04/23/2021 by Dr. Andy Bannister 2. right colectomy on 08/18/2021  3. Adjuvant FOLFOX, 12 cycles, 10/05/21 - 03/23/22  Current Therapy:  surveillance   HISTORY OF PRESENT ILLNESS:   Oncology History  Primary cancer of hepatic flexure s/p robotic right colectomy 08/18/2021  08/18/2021 Initial Diagnosis   Primary cancer of hepatic flexure s/p robotic right colectomy 08/18/2021   10/05/2021 - 10/21/2021 Chemotherapy   Patient is on Treatment Plan : COLORECTAL FOLFOX q14d x 6 months     10/05/2021 -  Chemotherapy   Patient is on Treatment Plan : COLORECTAL FOLFOX q14d x 6 months        INTERVAL HISTORY:   Fernando Dean is a 73 y.o. male presenting to clinic today for follow up of stage 3B right colon cancer. He was last seen by me on 03/07/23.  Since his last visit, he underwent surveillance CT A/P on 06/05/23 showing: new 5.3 cm low-density lesion in posterior segment IV of left liver, with imaging features suggestive of focal fatty deposition; increase in size of exophytic lesion in interpolar right kidney.  Today, he states that he is doing well overall. His appetite level is at 100%. His energy level is at 60%.  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 - [employer] Jude/[employer] MRI 2272 dual chamber PPM   Tuberculosis    as a child, was treated    Surgical History: Past Surgical History:  Procedure Laterality Date   ABDOMINAL AORTIC ENDOVASCULAR STENT GRAFT Bilateral 02/26/2021   Procedure: ABDOMINAL AORTIC ENDOVASCULAR STENT GRAFT REPAIR;  Surgeon: Young Hensen, MD;  Location: [facility] OR;  Service: Vascular;  Laterality: Bilateral;   BIOPSY  03/12/2021   Procedure: BIOPSY;  Surgeon: Urban Garden, MD;  [facility];  Service: Gastroenterology;;   BIOPSY  06/25/2021   Procedure: BIOPSY;  Surgeon: Urban Garden, MD;  [facility];  Service: Gastroenterology;;  mass    COLONOSCOPY WITH PROPOFOL  N/A 03/12/2021   Procedure: COLONOSCOPY WITH PROPOFOL ;  Surgeon: Urban Garden, MD;  [facility];  Service: Gastroenterology;  Laterality: N/A;  10:55   COLONOSCOPY WITH PROPOFOL  N/A 06/25/2021   Procedure: COLONOSCOPY WITH PROPOFOL ;  Surgeon: Urban Garden, MD;  [facility]: [facility];  Service: Gastroenterology;  Laterality: N/A;  235 ASA 2   HEMOSTASIS CLIP PLACEMENT  03/12/2021   Procedure: HEMOSTASIS CLIP PLACEMENT;  Surgeon: Urban Garden, MD;  [facility];  Service: Gastroenterology;;   HEMOSTASIS CLIP PLACEMENT  06/25/2021   Procedure: HEMOSTASIS CLIP PLACEMENT;  Surgeon: Urban Garden, MD;  [facility];  Service: Gastroenterology;;   HERNIA REPAIR Left 10/25/2006   IR IMAGING GUIDED PORT INSERTION  09/29/2021   PACEMAKER IMPLANT N/A 02/08/2021   Procedure: PACEMAKER IMPLANT;  Surgeon: Verona Goodwill, MD;  Location: [facility] INVASIVE CV LAB;  Service: Cardiovascular;  Laterality: N/A;   POLYPECTOMY  03/12/2021    Procedure: POLYPECTOMY;  Surgeon: Urban Garden, MD;  [facility];  Service: Gastroenterology;;   POLYPECTOMY  06/25/2021   Procedure: POLYPECTOMY;  Surgeon: Umberto Ganong, Bearl Limes, MD;  [facility];  Service: Gastroenterology;;   Daryle Eon  03/12/2021   Procedure: Daryle Eon;  Surgeon: Umberto Ganong, Bearl Limes, MD;  [facility];  Service: Gastroenterology;;    Social History: Social History   Socioeconomic History   Marital status: Single    Spouse name: Not on file   Number of children: Not on file   Years of education: Not on file   Highest education level: Not on file  Occupational History   Not on file  Tobacco Use   Smoking status: Every Day    Current packs/day: 0.75    Average packs/day: 0.8 packs/day for 50.0 years (37.5 ttl pk-yrs)    Types: Cigarettes    Passive exposure: Current   Smokeless tobacco: Never  Vaping Use   Vaping status: Never Used  Substance and Sexual Activity   Alcohol use: Yes    Alcohol/week: 2.0 - 3.0 standard drinks of alcohol    Types: 2 - 3 Shots of liquor per week    Comment: daily liquor moderatly   Drug use: Not on file    Comment: uses every other day   Sexual activity: Not Currently  Other Topics Concern   Not on file  Social History Narrative   Not on file   Social Drivers of Health   Financial Resource Strain: High Risk (10/14/2021)   Overall Financial Resource Strain (CARDIA)    Difficulty of Paying Living Expenses: Hard  Food Insecurity: Not on file  Transportation Needs: Not on file  Physical Activity: Not on file  Stress: Not on file  Social Connections: Not on file  Intimate Partner Violence: Not on file    Family History: Family History  Problem Relation Age of Onset   Heart disease Father    Colon polyps Sister        less than 10 lifetime polyps   Colon cancer Maternal Grandmother        dx 90s    Current Medications:  Current Outpatient Medications:     albuterol  (VENTOLIN  HFA) 108 (90 Base) MCG/ACT inhaler, Inhale 2 puffs into the lungs every 6 (six) hours as needed for wheezing or shortness of breath., Disp: 8 g, Rfl: 2   amLODipine  (NORVASC ) 2.5 MG tablet, TAKE 1 TABLET(2.5 MG) BY MOUTH DAILY, Disp: 90 tablet, Rfl: 3   aspirin  EC 81 MG EC tablet, Take 1 tablet (81 mg total) by mouth daily at 6 (six)  AM. Swallow whole., Disp: 30 tablet, Rfl: 11   atorvastatin  (LIPITOR) 10 MG tablet, Take 10 mg by mouth daily., Disp: , Rfl:    bisacodyl  (DULCOLAX) 5 MG EC tablet, Take 5 mg by mouth daily as needed for moderate constipation., Disp: , Rfl:    carvedilol  (COREG ) 6.25 MG tablet, Take 1 tablet (6.25 mg total) by mouth 2 (two) times daily with a meal., Disp: 60 tablet, Rfl: 3   famotidine  (PEPCID ) 20 MG tablet, Take 20 mg by mouth daily as needed for heartburn or indigestion., Disp: , Rfl:    feeding supplement (ENSURE ENLIVE / ENSURE PLUS) LIQD, Take 237 mLs by mouth 3 (three) times daily between meals. (Patient taking differently: Take 237 mLs by mouth 3 (three) times daily as needed (nutrition).), Disp: 237 mL, Rfl: 12   lidocaine -prilocaine  (EMLA ) cream, SMARTSIG:1 Application Topical PRN, Disp: , Rfl:    loperamide  (IMODIUM  A-D) 2 MG tablet, Take 1 tablet (2 mg total) by mouth 4 (four) times daily as needed for diarrhea or loose stools., Disp: , Rfl: 0   nicotine  (NICODERM CQ  - DOSED IN MG/24 HOURS) 21 mg/24hr patch, Place 21 mg onto the skin daily., Disp: , Rfl:    olmesartan (BENICAR) 5 MG tablet, Take 1 tablet by mouth daily., Disp: , Rfl:    prochlorperazine  (COMPAZINE ) 10 MG tablet, , Disp: , Rfl:    sodium bicarbonate 650 MG tablet, Take 650 mg by mouth 3 (three) times daily., Disp: , Rfl:    Vitamin D, Ergocalciferol, (DRISDOL) 1.25 MG (50000 UNIT) CAPS capsule, Take 50,000 Units by mouth once a week., Disp: , Rfl:  No current facility-administered medications for this visit.  Facility-Administered Medications Ordered in Other Visits:     magnesium  sulfate 2 GM/50ML IVPB, , , ,    palonosetron  (ALOXI ) 0.25 MG/5ML injection, , , ,    sodium chloride  flush (NS) 0.9 % injection 10 mL, 10 mL, Intracatheter, PRN, Lauramae Kneisley, MD, 10 mL at 11/18/21 1257   Allergies: Allergies  Allergen Reactions   Lisinopril Anaphylaxis   Naproxen Shortness Of Breath and Palpitations   Nsaids Shortness Of Breath and Palpitations    REVIEW OF SYSTEMS:   Review of Systems  Constitutional:  Negative for chills, fatigue and fever.  HENT:   Negative for lump/mass, mouth sores, nosebleeds, sore throat and trouble swallowing.   Eyes:  Negative for eye problems.  Respiratory:  Positive for cough. Negative for shortness of breath.   Cardiovascular:  Negative for chest pain, leg swelling and palpitations.  Gastrointestinal:  Negative for abdominal pain, constipation, diarrhea, nausea and vomiting.  Genitourinary:  Negative for bladder incontinence, difficulty urinating, dysuria, frequency, hematuria and nocturia.   Musculoskeletal:  Negative for arthralgias, back pain, flank pain, myalgias and neck pain.  Skin:  Negative for itching and rash.  Neurological:  Negative for dizziness, headaches and numbness.  Hematological:  Does not bruise/bleed easily.  Psychiatric/Behavioral:  Negative for depression, sleep disturbance and suicidal ideas. The patient is not nervous/anxious.   All other systems reviewed and are negative.    VITALS:   Blood pressure (!) 166/89, pulse 95, temperature 97.7 F (36.5 C), temperature source Oral, resp. rate 20, weight 170 lb 6.7 oz (77.3 kg), SpO2 100%.  Wt Readings from Last 3 Encounters:  06/12/23 170 lb 6.7 oz (77.3 kg)  03/07/23 168 lb 14 oz (76.6 kg)  12/27/22 166 lb 6.4 oz (75.5 kg)    Body mass index is 3,328.2 kg/m.  Performance status (ECOG):  1 - Symptomatic but completely ambulatory  PHYSICAL EXAM:   Physical Exam Vitals and nursing note reviewed. Exam conducted with a chaperone present.   Constitutional:      Appearance: Normal appearance.  Cardiovascular:     Rate and Rhythm: Normal rate and regular rhythm.     Pulses: Normal pulses.     Heart sounds: Normal heart sounds.  Pulmonary:     Effort: Pulmonary effort is normal.     Breath sounds: Normal breath sounds.  Abdominal:     Palpations: Abdomen is soft. There is no hepatomegaly, splenomegaly or mass.     Tenderness: There is no abdominal tenderness.  Musculoskeletal:     Right lower leg: No edema.     Left lower leg: No edema.  Lymphadenopathy:     Cervical: No cervical adenopathy.     Right cervical: No superficial, deep or posterior cervical adenopathy.    Left cervical: No superficial, deep or posterior cervical adenopathy.     Upper Body:     Right upper body: No supraclavicular or axillary adenopathy.     Left upper body: No supraclavicular or axillary adenopathy.  Neurological:     General: No focal deficit present.     Mental Status: He is alert and oriented to person, place, and time.  Psychiatric:        Mood and Affect: Mood normal.        Behavior: Behavior normal.     LABS:   CBC     Component Value Date/Time   WBC 4.6 06/05/2023 0816   RBC 3.61 (L) 06/05/2023 0816   HGB 12.4 (L) 06/05/2023 0816   HCT 36.4 (L) 06/05/2023 0816   PLT 152 06/05/2023 0816   MCV 100.8 (H) 06/05/2023 0816   MCH 34.3 (H) 06/05/2023 0816   MCHC 34.1 06/05/2023 0816   RDW 13.6 06/05/2023 0816   LYMPHSABS 1.5 06/05/2023 0816   MONOABS 0.6 06/05/2023 0816   EOSABS 0.1 06/05/2023 0816   BASOSABS 0.0 06/05/2023 0816    CMP      Component Value Date/Time   NA 135 06/05/2023 0816   K 4.2 06/05/2023 0816   CL 105 06/05/2023 0816   CO2 20 (L) 06/05/2023 0816   GLUCOSE 107 (H) 06/05/2023 0816   BUN 18 06/05/2023 0816   CREATININE 1.61 (H) 06/05/2023 0816   CALCIUM  9.3 06/05/2023 0816   PROT 7.3 06/05/2023 0816   PROT 7.5 05/23/2021 0000   ALBUMIN 3.4 (L) 06/05/2023 0816   ALBUMIN 4.0 05/23/2021 0000    AST 41 06/05/2023 0816   ALT 25 06/05/2023 0816   ALKPHOS 101 06/05/2023 0816   BILITOT 0.8 06/05/2023 0816   BILITOT 0.4 05/23/2021 0000   GFRNONAA 45 (L) 06/05/2023 0816   GFRAA  10/23/2006 2125    >60        The eGFR has been calculated using the MDRD equation. This calculation has not been validated in all clinical     Lab Results  Component Value Date   CEA1 4.6 06/05/2023   /  CEA  Date Value Ref Range Status  06/05/2023 4.6 0.0 - 4.7 ng/mL Final    Comment:    (NOTE)                             Nonsmokers          <3.9  Smokers             <5.6 [employer] Electrochemiluminescence Immunoassay (ECLIA) Values obtained with different assay methods or kits cannot be used interchangeably.  Results cannot be interpreted as absolute evidence of the presence or absence of malignant disease. Performed At: [facility] [address] [place], [place] [id number] Pearlean Botts MD JX:[id number]    No results found for: "PSA1" No results found for: "CAN199" No results found for: "CAN125"  No results found for: "TOTALPROTELP", "ALBUMINELP", "A1GS", "A2GS", "BETS", "BETA2SER", "GAMS", "MSPIKE", "SPEI" Lab Results  Component Value Date   TIBC 338 06/05/2023   TIBC 382 02/28/2023   TIBC 342 11/09/2022   FERRITIN 270 06/05/2023   FERRITIN 439 (H) 02/28/2023   FERRITIN 260 11/09/2022   IRONPCTSAT 17 (L) 06/05/2023   IRONPCTSAT 23 02/28/2023   IRONPCTSAT 35 11/09/2022   Lab Results  Component Value Date   LDH 141 05/11/2021     STUDIES:   CT ABDOMEN PELVIS WO CONTRAST Result Date: 06/09/2023 CLINICAL DATA:  Colon cancer restaging.  * Tracking Code: BO * EXAM: CT ABDOMEN AND PELVIS WITHOUT CONTRAST TECHNIQUE: Multidetector CT imaging of the abdomen and pelvis was performed following the standard protocol without IV contrast. RADIATION DOSE REDUCTION: This exam was performed according to the departmental dose-optimization  program which includes automated exposure control, adjustment of the mA and/or kV according to patient size and/or use of iterative reconstruction technique. COMPARISON:  11/09/2022 FINDINGS: Lower chest: Chronic interstitial changes. No suspicious pulmonary nodule or mass within the visualized lung bases. Hepatobiliary: There is a new 5.3 x 3.3 cm low-density lesion in posterior segment IV of the left liver. Imaging features suggest focal fatty deposition. There is no evidence for gallstones, gallbladder wall thickening, or pericholecystic fluid. No intrahepatic or extrahepatic biliary dilation. Pancreas: No focal mass lesion. No dilatation of the main duct. No intraparenchymal cyst. No peripancreatic edema. Spleen: No splenomegaly. No suspicious focal mass lesion. Adrenals/Urinary Tract: No adrenal nodule or mass. 16 mm exophytic lesion interpolar right kidney has increased from 12 mm previously with attenuation too high to be a simple cyst. Other small hypoattenuating right renal lesions evident. Left kidney markedly atrophic. No evidence for hydroureter. The urinary bladder appears normal for the degree of distention. Stomach/Bowel: Stomach is unremarkable. No gastric wall thickening. No evidence of outlet obstruction. Duodenum is normally positioned as is the ligament of Treitz. No small bowel wall thickening. No small bowel dilatation. Status post right hemicolectomy. Vascular/Lymphatic: Status post endograft placement for abdominal aortic aneurysm. Native aneurysm sac measures up to 4.9 cm today compared to 5.0 cm previously. There is no gastrohepatic or hepatoduodenal ligament lymphadenopathy. No retroperitoneal or mesenteric lymphadenopathy. No pelvic sidewall lymphadenopathy. Reproductive: The prostate gland and seminal vesicles are unremarkable. Other: No intraperitoneal free fluid. Musculoskeletal: Small right groin hernia contains only fat. No worrisome lytic or sclerotic osseous abnormality.  IMPRESSION: 1. New 5.3 x 3.3 cm low-density lesion in posterior segment IV of the left liver. Imaging features suggest focal fatty deposition. Given the history of colon cancer, MRI abdomen with and without contrast recommended to further evaluate. 2. 16 mm exophytic lesion interpolar right kidney has increased from 12 mm previously with attenuation too high to be a simple cyst. This could also be further evaluated with MRI. 3. Status post endograft placement for abdominal aortic aneurysm. Native aneurysm sac measures up to 4.9 cm today compared to 5.0 cm previously. 4. Small right groin hernia contains only fat. 5.  Aortic Atherosclerois (  ICD10-170.0) Electronically Signed   By: Donnal Fusi M.D.   On: 06/09/2023 08:24

## 2023-06-12 ENCOUNTER — Inpatient Hospital Stay: Payer: Medicare Other | Admitting: Hematology

## 2023-06-12 VITALS — BP 166/89 | HR 95 | Temp 97.7°F | Resp 20 | Wt 170.4 lb

## 2023-06-12 DIAGNOSIS — G629 Polyneuropathy, unspecified: Secondary | ICD-10-CM | POA: Diagnosis not present

## 2023-06-12 DIAGNOSIS — Z08 Encounter for follow-up examination after completed treatment for malignant neoplasm: Secondary | ICD-10-CM | POA: Diagnosis present

## 2023-06-12 DIAGNOSIS — K769 Liver disease, unspecified: Secondary | ICD-10-CM | POA: Diagnosis not present

## 2023-06-12 DIAGNOSIS — Z85038 Personal history of other malignant neoplasm of large intestine: Secondary | ICD-10-CM | POA: Diagnosis present

## 2023-06-12 DIAGNOSIS — N189 Chronic kidney disease, unspecified: Secondary | ICD-10-CM | POA: Diagnosis not present

## 2023-06-12 DIAGNOSIS — D631 Anemia in chronic kidney disease: Secondary | ICD-10-CM | POA: Diagnosis not present

## 2023-06-12 NOTE — Patient Instructions (Addendum)
 [facility] Discharge Instructions   You were seen and examined today by Dr. Cheree Cords.  He reviewed the results of your lab work which are normal/stable.   He reviewed the results of your CT scan. It is showing a spot on the liver, and a spot on the kidney that has grown in size.   We will arrange for you to have a CT of the abdomen with contrast to investigate further the spots on the liver and kidney. We will see you back after the CT to review the results.   Return as scheduled.    Thank you for choosing [facility] at [facility] to provide your oncology and hematology care.  To afford each patient quality time with our provider, please arrive at least 15 minutes before your scheduled appointment time.   If you have a lab appointment with the Cancer Center please come in thru the Main Entrance and check in at the main information desk.  You need to re-schedule your appointment should you arrive 10 or more minutes late.  We strive to give you quality time with our providers, and arriving late affects you and other patients whose appointments are after yours.  Also, if you no show three or more times for appointments you may be dismissed from the clinic at the providers discretion.     Again, thank you for choosing [facility].  Our hope is that these requests will decrease the amount of time that you wait before being seen by our physicians.       _____________________________________________________________  Should you have questions after your visit to [facility], please contact our office at [phone] and follow the prompts.  Our office hours are 8:00 a.m. and 4:30 p.m. Monday - Friday.  Please note that voicemails left after 4:00 p.m. may not be returned until the following business day.  We are closed weekends and major holidays.  You do have access to a nurse 24-7, just call the main number to  the clinic [phone] and do not press any options, hold on the line and a nurse will answer the phone.    For prescription refill requests, have your pharmacy contact our office and allow 72 hours.    Due to Covid, you will need to wear a mask upon entering the hospital. If you do not have a mask, a mask will be given to you at the Main Entrance upon arrival. For doctor visits, patients may have 1 support person age 73 or older with them. For treatment visits, patients can not have anyone with them due to social distancing guidelines and our immunocompromised population.

## 2023-06-20 NOTE — Addendum Note (Signed)
 Addended by: Edra Govern D on: 06/20/2023 05:13 PM   Modules accepted: Orders

## 2023-06-20 NOTE — Progress Notes (Signed)
 Remote pacemaker transmission.

## 2023-06-22 ENCOUNTER — Inpatient Hospital Stay: Attending: Hematology

## 2023-06-22 ENCOUNTER — Ambulatory Visit (HOSPITAL_COMMUNITY)
Admission: RE | Admit: 2023-06-22 | Discharge: 2023-06-22 | Disposition: A | Discharge status: Home / Self Care | Source: Ambulatory Visit | Attending: Hematology | Admitting: Hematology

## 2023-06-22 VITALS — BP 123/68 | HR 67 | Temp 97.8°F | Resp 20

## 2023-06-22 DIAGNOSIS — N189 Chronic kidney disease, unspecified: Secondary | ICD-10-CM | POA: Insufficient documentation

## 2023-06-22 DIAGNOSIS — D631 Anemia in chronic kidney disease: Secondary | ICD-10-CM | POA: Insufficient documentation

## 2023-06-22 DIAGNOSIS — Z85038 Personal history of other malignant neoplasm of large intestine: Secondary | ICD-10-CM | POA: Insufficient documentation

## 2023-06-22 DIAGNOSIS — C186 Malignant neoplasm of descending colon: Secondary | ICD-10-CM

## 2023-06-22 DIAGNOSIS — K769 Liver disease, unspecified: Secondary | ICD-10-CM | POA: Insufficient documentation

## 2023-06-22 DIAGNOSIS — D509 Iron deficiency anemia, unspecified: Secondary | ICD-10-CM

## 2023-06-22 MED ORDER — SODIUM CHLORIDE 0.9 % IV SOLN: Freq: Once | INTRAVENOUS | Status: AC

## 2023-06-22 MED ORDER — HEPARIN SOD (PORK) LOCK FLUSH 100 UNIT/ML IV SOLN
500.0000 [IU] | Freq: Once | INTRAVENOUS | Status: AC
  Administered 2023-06-22: 500 [IU] via INTRAVENOUS

## 2023-06-22 MED ORDER — IOHEXOL 300 MG/ML  SOLN
80.0000 mL | Freq: Once | INTRAMUSCULAR | Status: AC | PRN
  Administered 2023-06-22: 80 mL via INTRAVENOUS

## 2023-06-22 MED ORDER — HEPARIN SOD (PORK) LOCK FLUSH 100 UNIT/ML IV SOLN
INTRAVENOUS | Status: AC
Start: 2023-06-22 — End: ?
  Filled 2023-06-22: qty 5

## 2023-06-22 NOTE — Progress Notes (Signed)
 Patient tolerated fluids with no complaints voiced. Port site clean and dry with no bruising or swelling noted at site.  Good blood return noted before and after administration of fluids.  Port left accessed for radiology, per orders pt is to have CT with contrast today. Radiology nurse made aware of port being accessed.   Patient left in satisfactory condition with VSS and no s/s of distress noted. All follow ups as scheduled.   Rhianne Soman [employer]

## 2023-06-28 NOTE — Progress Notes (Signed)
 [facility] [address] [place] [address]    Clinic Day:  06/29/2023  Referring physician: Newt Barefoot, FNP  Patient Care Team: Newt Barefoot, FNP as PCP - General (Family Medicine) Gerard Knight, MD as PCP - Cardiology (Cardiology) Paulett Boros, MD as Medical Oncologist (Medical Oncology) Gerhard Knuckles, RN as Oncology Nurse Navigator (Medical Oncology)   ASSESSMENT & PLAN:   Assessment: 1. 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 - Colonoscopy on 03/12/2021: Fungating partially obstructing large mass found at 40 cm proximal to the anus, circumferential with no bleeding present. - CEA on 03/12/2021: 8.3 - CT angio abdomen and pelvis on 04/08/2021: No evidence of metastatic lymphadenopathy or liver mets.  Stable 7 mm subpleural pulmonary nodule in the periphery of the right lower lobe. - CT angio CAP on 02/04/2021: No evidence of lung metastasis, abdominal metastasis. - Resection of splenic flexure mass on 04/23/2021 by Dr. Andy Bannister. - Pathology: Moderately differentiated adenocarcinoma, invades into subserosa, margins negative, 0/38 lymph nodes.  No LVI or perineural invasion.  PT3 pN0.  MSI-stable. - Guardant reveal (05/21/2021): CT DNA was detected. - PET scan on 06/10/2021 shows 2 cm hypermetabolic soft tissue density in the transverse colon.  No evidence of local or distant metastatic disease.   2. Social/family history: - He is a retired Journalist, newspaper. - Current active smoker, half pack per day for 50 years. - Mother had colon cancer at age 60.  Maternal grandmother had colon cancer.  3.  Stage III (Q4ON6) right colon adenocarcinoma: - Right colectomy on 08/10/2021 by Dr. Andy Bannister. - Pathology with grade 2 moderately differentiated colonic adenocarcinoma, tumor site ascending colon, margins negative, 1/22 lymph nodes involved, [id number].  MMR preserved.   - 12 cycles of FOLFOX from 10/05/2021  through 03/23/2022    Plan: 1. Stage III (T4AN1) right colon adenocarcinoma, MMR preserved: - Denies any change in bowel habits, bleeding per rectum or melena. - CEA on 06/05/2023 was 4.6.  LFTs are normal. - CTAP without contrast on 06/05/2023 showed new 5.3 x 3.3 cm low-density lesion in the posterior segment of left liver.  Differential includes focal fatty deposition. - She could not have MRI because of pacemaker. - We did CT abdomen with contrast on 06/22/2023: Similar area of decreased attenuation in segment 4B of the liver was seen. - Recommend follow-up in 3 months with repeat CEA, LFTs.  Will also repeat CT abdomen with and without contrast.  Will give fluids on the same day because of his CKD.   2.  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  Hemoglobin is 12.4.  Ferritin is 270 and percent saturation 17.  No indication for parenteral iron therapy.   3.  Peripheral neuropathy: - Tingling in the fingertips and toes from oxaliplatin  is improving.    Orders Placed This Encounter  Procedures   CT ABDOMEN PELVIS W WO CONTRAST    Standing Status:   Future    Expected Date:   09/29/2023    Expiration Date:   06/28/2024    If indicated for the ordered procedure, I authorize the administration of contrast media per Radiology protocol:   Yes    Does the patient have a contrast media/X-ray dye allergy?:   No    Preferred imaging location?:   [facility]    If indicated for the ordered procedure, I authorize the administration of oral contrast media per Radiology protocol:  Yes      I,Helena R Teague,acting as a scribe for Paulett Boros, MD.,have documented all relevant documentation on the behalf of Paulett Boros, MD,as directed by  Paulett Boros, MD while in the presence of Paulett Boros, MD.  I, Paulett Boros MD, have reviewed the above documentation for accuracy and completeness, and I agree with the above.    Paulett Boros, MD   5/8/202511:14 AM  CHIEF COMPLAINT:   Diagnosis: stage 3B right colon cancer    Cancer Staging  Cancer of left colon [facility]) Staging form: Colon and Rectum, AJCC 8th Edition - Clinical stage from 05/11/2021: Stage IIA (cT3, cN0, cM0) - Unsigned  Primary cancer of hepatic flexure s/p robotic right colectomy 08/18/2021 Staging form: Colon and Rectum, AJCC 8th Edition - Clinical stage from 09/23/2021: Stage IIIB (cT4a, cN1a, cM0) - Unsigned    Prior Therapy: 1. Resection of splenic flexure mass on 04/23/2021 by Dr. Andy Bannister 2. right colectomy on 08/18/2021  3. Adjuvant FOLFOX, 12 cycles, 10/05/21 - 03/23/22  Current Therapy:  surveillance   HISTORY OF PRESENT ILLNESS:   Oncology History  Primary cancer of hepatic flexure s/p robotic right colectomy 08/18/2021  08/18/2021 Initial Diagnosis   Primary cancer of hepatic flexure s/p robotic right colectomy 08/18/2021   10/05/2021 - 10/21/2021 Chemotherapy   Patient is on Treatment Plan : COLORECTAL FOLFOX q14d x 6 months     10/05/2021 -  Chemotherapy   Patient is on Treatment Plan : COLORECTAL FOLFOX q14d x 6 months        INTERVAL HISTORY:   Fernando Dean is a 73 y.o. male presenting to clinic today for follow up of stage 3B right colon cancer. He was last seen by me on 06/12/23.  Since his last visit, he underwent CT abdomen on 06/22/23 that found: Similar geographic area of decreased attenuation in segment IVb of the liver most likely representing focal fatty infiltration.  Today, he states that he is doing well overall. His appetite level is at 50%. His energy level is at 50%.  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 - [employer] Jude/[employer] MRI 2272 dual chamber PPM   Tuberculosis    as a child, was treated    Surgical History: Past Surgical History:  Procedure Laterality Date   ABDOMINAL AORTIC ENDOVASCULAR STENT GRAFT Bilateral 02/26/2021   Procedure: ABDOMINAL AORTIC ENDOVASCULAR STENT GRAFT REPAIR;  Surgeon: Young Hensen, MD;  Location: [facility] OR;  Service: Vascular;  Laterality: Bilateral;   BIOPSY  03/12/2021   Procedure: BIOPSY;  Surgeon: Urban Garden, MD;  Location: [facility];  Service: Gastroenterology;;   BIOPSY  06/25/2021   Procedure: BIOPSY;  Surgeon: Urban Garden, MD;  Location: [facility];  Service: Gastroenterology;;  mass    COLONOSCOPY WITH PROPOFOL  N/A 03/12/2021   Procedure: COLONOSCOPY WITH PROPOFOL ;  Surgeon: Urban Garden, MD;  Location: [facility];  Service: Gastroenterology;  Laterality: N/A;  10:55   COLONOSCOPY WITH PROPOFOL  N/A 06/25/2021   Procedure: COLONOSCOPY WITH PROPOFOL ;  Surgeon: Urban Garden, MD;  Location: [facility];  Service: Gastroenterology;  Laterality: N/A;  235 ASA 2   HEMOSTASIS CLIP PLACEMENT  03/12/2021   Procedure: HEMOSTASIS CLIP PLACEMENT;  Surgeon: Urban Garden, MD;  [facility]: [facility];  Service: Gastroenterology;;   HEMOSTASIS CLIP PLACEMENT  06/25/2021   Procedure: HEMOSTASIS CLIP PLACEMENT;  Surgeon: Urban Garden, MD;  [facility]: [facility];  Service: Gastroenterology;;   HERNIA REPAIR Left 10/25/2006   IR IMAGING GUIDED PORT INSERTION  09/29/2021   PACEMAKER IMPLANT N/A 02/08/2021   Procedure: PACEMAKER IMPLANT;  Surgeon: Verona Goodwill, MD;  Location: [facility] INVASIVE CV LAB;  Service: Cardiovascular;  Laterality: N/A;   POLYPECTOMY  03/12/2021   Procedure: POLYPECTOMY;  Surgeon: Urban Garden, MD;  [facility]: [facility];  Service: Gastroenterology;;   POLYPECTOMY  06/25/2021   Procedure: POLYPECTOMY;  Surgeon:  Umberto Ganong, Bearl Limes, MD;  Location: [facility];  Service: Gastroenterology;;   Daryle Eon  03/12/2021   Procedure: Daryle Eon;  Surgeon: Umberto Ganong, Bearl Limes, MD;  Location: [facility];  Service: Gastroenterology;;    Social History: Social History   Socioeconomic History   Marital status: Single    Spouse name: Not on file   Number of children: Not on file   Years of education: Not on file   Highest education level: Not on file  Occupational History   Not on file  Tobacco Use   Smoking status: Every Day    Current packs/day: 0.75    Average packs/day: 0.8 packs/day for 50.0 years (37.5 ttl pk-yrs)    Types: Cigarettes    Passive exposure: Current   Smokeless tobacco: Never  Vaping Use   Vaping status: Never Used  Substance and Sexual Activity   Alcohol use: Yes    Alcohol/week: 2.0 - 3.0 standard drinks of alcohol    Types: 2 - 3 Shots of liquor per week    Comment: daily liquor moderatly   Drug use: Not on file    Comment: uses every other day   Sexual activity: Not Currently  Other Topics Concern   Not on file  Social History Narrative   Not on file   Social Drivers of Health   Financial Resource Strain: High Risk (10/14/2021)   Overall Financial Resource Strain (CARDIA)    Difficulty of Paying Living Expenses: Hard  Food Insecurity: Not on file  Transportation Needs: Not on file  Physical Activity: Not on file  Stress: Not on file  Social Connections: Not on file  Intimate Partner Violence: Not on file    Family History: Family History  Problem Relation Age of Onset   Heart disease Father    Colon polyps Sister        less than 10 lifetime polyps   Colon cancer Maternal Grandmother        dx 90s    Current Medications:  Current Outpatient Medications:    albuterol  (VENTOLIN  HFA) 108 (90 Base) MCG/ACT inhaler, Inhale 2 puffs into the lungs every 6 (six) hours as needed for wheezing or shortness of breath., Disp: 8 g, Rfl: 2    amLODipine  (NORVASC ) 2.5 MG tablet, TAKE 1 TABLET(2.5 MG) BY MOUTH DAILY, Disp: 90 tablet, Rfl: 3   aspirin  EC 81 MG EC tablet, Take 1 tablet (81 mg total) by mouth daily at 6 (six) AM. Swallow whole., Disp: 30 tablet, Rfl: 11   atorvastatin  (LIPITOR) 10 MG tablet, Take 10 mg by mouth daily., Disp: , Rfl:    bisacodyl  (DULCOLAX) 5 MG EC tablet, Take 5  mg by mouth daily as needed for moderate constipation., Disp: , Rfl:    carvedilol  (COREG ) 6.25 MG tablet, Take 1 tablet (6.25 mg total) by mouth 2 (two) times daily with a meal., Disp: 60 tablet, Rfl: 3   famotidine  (PEPCID ) 20 MG tablet, Take 20 mg by mouth daily as needed for heartburn or indigestion., Disp: , Rfl:    feeding supplement (ENSURE ENLIVE / ENSURE PLUS) LIQD, Take 237 mLs by mouth 3 (three) times daily between meals. (Patient taking differently: Take 237 mLs by mouth 3 (three) times daily as needed (nutrition).), Disp: 237 mL, Rfl: 12   lidocaine -prilocaine  (EMLA ) cream, SMARTSIG:1 Application Topical PRN, Disp: , Rfl:    loperamide  (IMODIUM  A-D) 2 MG tablet, Take 1 tablet (2 mg total) by mouth 4 (four) times daily as needed for diarrhea or loose stools., Disp: , Rfl: 0   nicotine  (NICODERM CQ  - DOSED IN MG/24 HOURS) 21 mg/24hr patch, Place 21 mg onto the skin daily., Disp: , Rfl:    olmesartan (BENICAR) 5 MG tablet, Take 1 tablet by mouth daily., Disp: , Rfl:    prochlorperazine  (COMPAZINE ) 10 MG tablet, , Disp: , Rfl:    sodium bicarbonate 650 MG tablet, Take 650 mg by mouth 3 (three) times daily., Disp: , Rfl:    Vitamin D, Ergocalciferol, (DRISDOL) 1.25 MG (50000 UNIT) CAPS capsule, Take 50,000 Units by mouth once a week., Disp: , Rfl:  No current facility-administered medications for this visit.  Facility-Administered Medications Ordered in Other Visits:    magnesium  sulfate 2 GM/50ML IVPB, , , ,    palonosetron  (ALOXI ) 0.25 MG/5ML injection, , , ,    sodium chloride  flush (NS) 0.9 % injection 10 mL, 10 mL, Intracatheter, PRN,  Natoshia Souter, MD, 10 mL at 11/18/21 1257   Allergies: Allergies  Allergen Reactions   Lisinopril Anaphylaxis   Naproxen Shortness Of Breath and Palpitations   Nsaids Shortness Of Breath and Palpitations    REVIEW OF SYSTEMS:   Review of Systems  Constitutional:  Negative for chills, fatigue and fever.  HENT:   Negative for lump/mass, mouth sores, nosebleeds, sore throat and trouble swallowing.   Eyes:  Negative for eye problems.  Respiratory:  Positive for shortness of breath. Negative for cough.   Cardiovascular:  Negative for chest pain, leg swelling and palpitations.  Gastrointestinal:  Negative for abdominal pain, constipation, diarrhea, nausea and vomiting.  Genitourinary:  Negative for bladder incontinence, difficulty urinating, dysuria, frequency, hematuria and nocturia.   Musculoskeletal:  Negative for arthralgias, back pain, flank pain, myalgias and neck pain.  Skin:  Negative for itching and rash.  Neurological:  Negative for dizziness, headaches and numbness.  Hematological:  Does not bruise/bleed easily.  Psychiatric/Behavioral:  Positive for sleep disturbance. Negative for depression and suicidal ideas. The patient is not nervous/anxious.   All other systems reviewed and are negative.    VITALS:   Blood pressure (!) 157/83, pulse 70, temperature (!) 97.4 F (36.3 C), temperature source Tympanic, resp. rate 18, height 6' (1.829 m), weight 169 lb (76.7 kg), SpO2 98%.  Wt Readings from Last 3 Encounters:  06/29/23 169 lb (76.7 kg)  06/12/23 170 lb 6.7 oz (77.3 kg)  03/07/23 168 lb 14 oz (76.6 kg)    Body mass index is 22.92 kg/m.  Performance status (ECOG): 1 - Symptomatic but completely ambulatory  PHYSICAL EXAM:   Physical Exam Vitals and nursing note reviewed. Exam conducted with a chaperone present.  Constitutional:      Appearance:  Normal appearance.  Cardiovascular:     Rate and Rhythm: Normal rate and regular rhythm.     Pulses: Normal  pulses.     Heart sounds: Normal heart sounds.  Pulmonary:     Effort: Pulmonary effort is normal.     Breath sounds: Normal breath sounds.  Abdominal:     Palpations: Abdomen is soft. There is no hepatomegaly, splenomegaly or mass.     Tenderness: There is no abdominal tenderness.  Musculoskeletal:     Right lower leg: No edema.     Left lower leg: No edema.  Lymphadenopathy:     Cervical: No cervical adenopathy.     Right cervical: No superficial, deep or posterior cervical adenopathy.    Left cervical: No superficial, deep or posterior cervical adenopathy.     Upper Body:     Right upper body: No supraclavicular or axillary adenopathy.     Left upper body: No supraclavicular or axillary adenopathy.  Neurological:     General: No focal deficit present.     Mental Status: He is alert and oriented to person, place, and time.  Psychiatric:        Mood and Affect: Mood normal.        Behavior: Behavior normal.     LABS:   CBC     Component Value Date/Time   WBC 4.6 06/05/2023 0816   RBC 3.61 (L) 06/05/2023 0816   HGB 12.4 (L) 06/05/2023 0816   HCT 36.4 (L) 06/05/2023 0816   PLT 152 06/05/2023 0816   MCV 100.8 (H) 06/05/2023 0816   MCH 34.3 (H) 06/05/2023 0816   MCHC 34.1 06/05/2023 0816   RDW 13.6 06/05/2023 0816   LYMPHSABS 1.5 06/05/2023 0816   MONOABS 0.6 06/05/2023 0816   EOSABS 0.1 06/05/2023 0816   BASOSABS 0.0 06/05/2023 0816    CMP      Component Value Date/Time   NA 135 06/05/2023 0816   K 4.2 06/05/2023 0816   CL 105 06/05/2023 0816   CO2 20 (L) 06/05/2023 0816   GLUCOSE 107 (H) 06/05/2023 0816   BUN 18 06/05/2023 0816   CREATININE 1.61 (H) 06/05/2023 0816   CALCIUM  9.3 06/05/2023 0816   PROT 7.3 06/05/2023 0816   PROT 7.5 05/23/2021 0000   ALBUMIN 3.4 (L) 06/05/2023 0816   ALBUMIN 4.0 05/23/2021 0000   AST 41 06/05/2023 0816   ALT 25 06/05/2023 0816   ALKPHOS 101 06/05/2023 0816   BILITOT 0.8 06/05/2023 0816   BILITOT 0.4 05/23/2021 0000    GFRNONAA 45 (L) 06/05/2023 0816   GFRAA  10/23/2006 2125    >60        The eGFR has been calculated using the MDRD equation. This calculation has not been validated in all clinical     Lab Results  Component Value Date   CEA1 4.6 06/05/2023   /  CEA  Date Value Ref Range Status  06/05/2023 4.6 0.0 - 4.7 ng/mL Final    Comment:    (NOTE)                             Nonsmokers          <3.9                             Smokers             <5.6 [employer]  Electrochemiluminescence Immunoassay (ECLIA) Values obtained with different assay methods or kits cannot be used interchangeably.  Results cannot be interpreted as absolute evidence of the presence or absence of malignant disease. Performed At: [facility] [address] [place], [place] [id number] Pearlean Botts MD WU:[id number]    No results found for: "PSA1" No results found for: "NFA213" No results found for: "CAN125"  No results found for: "TOTALPROTELP", "ALBUMINELP", "A1GS", "A2GS", "BETS", "BETA2SER", "GAMS", "MSPIKE", "SPEI" Lab Results  Component Value Date   TIBC 338 06/05/2023   TIBC 382 02/28/2023   TIBC 342 11/09/2022   FERRITIN 270 06/05/2023   FERRITIN 439 (H) 02/28/2023   FERRITIN 260 11/09/2022   IRONPCTSAT 17 (L) 06/05/2023   IRONPCTSAT 23 02/28/2023   IRONPCTSAT 35 11/09/2022   Lab Results  Component Value Date   LDH 141 05/11/2021     STUDIES:   CT ABDOMEN W CONTRAST Result Date: 06/23/2023 EXAMINATION: CT ABDOMEN W CONTRAST CLINICAL INDICATION: Male, 73 years old. Liver lesion, > 1cm, history of malignancy TECHNIQUE: Axial CT of the abdomen with 100 cc Omnipaque  300 intravenous contrast. Multiplanar reformations provided. Unless otherwise specified, incidental thyroid, adrenal, renal lesions do not require dedicated imaging follow up. Additionally, any mentioned pulmonary nodules do not require dedicated imaging follow-up based on the Fleischner guidelines unless otherwise  specified. Coronary calcifications are not identified unless otherwise specified. COMPARISON: 06/05/2023 FINDINGS: The lung bases demonstrate minimal atelectatic changes. The heart is normal in size. Cardiac conduction device partially imaged. There is a similar geographic area of decreased attenuation in segment IVb measuring 4.4 x 3.3 cm, which is not significantly changed when measured in similar fashion on the prior examination. The gallbladder is normal. The spleen is normal. Pancreas is normal. The adrenals are normal. Left kidney is atrophic. Right renal cysts are seen including some minor 16mm hemorrhagic cyst in the interpole of the right kidney. There is a similar 5.0 cm abdominal aortic aneurysm status post endovascular repair with aortobiiliac stent graft. Scattered atherosclerotic changes are present. Partial colectomy noted. Visualized loops of large and small bowel are otherwise within normal limits. No free fluid or adenopathy. There are degenerative changes of the spine. IMPRESSION: Similar geographic area of decreased attenuation in segment IVb of the liver most likely representing focal fatty infiltration. DOSE REDUCTION: This exam was performed according to our departmental dose-optimization program which includes automated exposure control, adjustment of the mA and/or kV according to patient size and/or use of iterative reconstruction technique. Electronically signed by: Italy Engel MD 06/23/2023 11:57 AM EDT RP Workstation: [id number]   CT ABDOMEN PELVIS WO CONTRAST Result Date: 06/09/2023 CLINICAL DATA:  Colon cancer restaging.  * Tracking Code: BO * EXAM: CT ABDOMEN AND PELVIS WITHOUT CONTRAST TECHNIQUE: Multidetector CT imaging of the abdomen and pelvis was performed following the standard protocol without IV contrast. RADIATION DOSE REDUCTION: This exam was performed according to the departmental dose-optimization program which includes automated exposure control, adjustment of the mA  and/or kV according to patient size and/or use of iterative reconstruction technique. COMPARISON:  11/09/2022 FINDINGS: Lower chest: Chronic interstitial changes. No suspicious pulmonary nodule or mass within the visualized lung bases. Hepatobiliary: There is a new 5.3 x 3.3 cm low-density lesion in posterior segment IV of the left liver. Imaging features suggest focal fatty deposition. There is no evidence for gallstones, gallbladder wall thickening, or pericholecystic fluid. No intrahepatic or extrahepatic biliary dilation. Pancreas: No focal mass lesion. No dilatation of the main duct. No intraparenchymal cyst. No peripancreatic edema.  Spleen: No splenomegaly. No suspicious focal mass lesion. Adrenals/Urinary Tract: No adrenal nodule or mass. 16 mm exophytic lesion interpolar right kidney has increased from 12 mm previously with attenuation too high to be a simple cyst. Other small hypoattenuating right renal lesions evident. Left kidney markedly atrophic. No evidence for hydroureter. The urinary bladder appears normal for the degree of distention. Stomach/Bowel: Stomach is unremarkable. No gastric wall thickening. No evidence of outlet obstruction. Duodenum is normally positioned as is the ligament of Treitz. No small bowel wall thickening. No small bowel dilatation. Status post right hemicolectomy. Vascular/Lymphatic: Status post endograft placement for abdominal aortic aneurysm. Native aneurysm sac measures up to 4.9 cm today compared to 5.0 cm previously. There is no gastrohepatic or hepatoduodenal ligament lymphadenopathy. No retroperitoneal or mesenteric lymphadenopathy. No pelvic sidewall lymphadenopathy. Reproductive: The prostate gland and seminal vesicles are unremarkable. Other: No intraperitoneal free fluid. Musculoskeletal: Small right groin hernia contains only fat. No worrisome lytic or sclerotic osseous abnormality. IMPRESSION: 1. New 5.3 x 3.3 cm low-density lesion in posterior segment IV of the  left liver. Imaging features suggest focal fatty deposition. Given the history of colon cancer, MRI abdomen with and without contrast recommended to further evaluate. 2. 16 mm exophytic lesion interpolar right kidney has increased from 12 mm previously with attenuation too high to be a simple cyst. This could also be further evaluated with MRI. 3. Status post endograft placement for abdominal aortic aneurysm. Native aneurysm sac measures up to 4.9 cm today compared to 5.0 cm previously. 4. Small right groin hernia contains only fat. 5.  Aortic Atherosclerois (ICD10-170.0) Electronically Signed   By: Donnal Fusi M.D.   On: 06/09/2023 08:24

## 2023-06-29 ENCOUNTER — Inpatient Hospital Stay: Admitting: Hematology

## 2023-06-29 VITALS — BP 157/83 | HR 70 | Temp 97.4°F | Resp 18 | Ht 72.0 in | Wt 169.0 lb

## 2023-06-29 DIAGNOSIS — K769 Liver disease, unspecified: Secondary | ICD-10-CM

## 2023-06-29 NOTE — Patient Instructions (Signed)
 [facility] Discharge Instructions   You were seen and examined today by Dr. Cheree Cords.  He reviewed the results of your CT scan which did not show any evidence of cancer.   We will see you back in . We will repeat lab work and CT scan prior to this visit.   Return as scheduled.    Thank you for choosing [facility] at [facility] to provide your oncology and hematology care.  To afford each patient quality time with our provider, please arrive at least 15 minutes before your scheduled appointment time.   If you have a lab appointment with the Cancer Center please come in thru the Main Entrance and check in at the main information desk.  You need to re-schedule your appointment should you arrive 10 or more minutes late.  We strive to give you quality time with our providers, and arriving late affects you and other patients whose appointments are after yours.  Also, if you no show three or more times for appointments you may be dismissed from the clinic at the providers discretion.     Again, thank you for choosing [facility].  Our hope is that these requests will decrease the amount of time that you wait before being seen by our physicians.       _____________________________________________________________  Should you have questions after your visit to [facility], please contact our office at [phone] and follow the prompts.  Our office hours are 8:00 a.m. and 4:30 p.m. Monday - Friday.  Please note that voicemails left after 4:00 p.m. may not be returned until the following business day.  We are closed weekends and major holidays.  You do have access to a nurse 24-7, just call the main number to the clinic [phone] and do not press any options, hold on the line and a nurse will answer the phone.    For prescription refill requests, have your pharmacy contact our office and allow 72 hours.     Due to Covid, you will need to wear a mask upon entering the hospital. If you do not have a mask, a mask will be given to you at the Main Entrance upon arrival. For doctor visits, patients may have 1 support person age 73 or older with them. For treatment visits, patients can not have anyone with them due to social distancing guidelines and our immunocompromised population.

## 2023-08-07 ENCOUNTER — Ambulatory Visit (INDEPENDENT_AMBULATORY_CARE_PROVIDER_SITE_OTHER): Payer: Medicare Other

## 2023-08-07 DIAGNOSIS — I442 Atrioventricular block, complete: Secondary | ICD-10-CM | POA: Diagnosis not present

## 2023-08-07 LAB — CUP PACEART REMOTE DEVICE CHECK
Battery Remaining Longevity: 97 mo
Battery Remaining Percentage: 79 %
Battery Voltage: 3.01 V
Brady Statistic AP VP Percent: 5.1 %
Brady Statistic AP VS Percent: 1 %
Brady Statistic AS VP Percent: 94 %
Brady Statistic AS VS Percent: 1 %
Brady Statistic RA Percent Paced: 4.4 %
Brady Statistic RV Percent Paced: 99 %
Date Time Interrogation Session: 20250616020353
Implantable Lead Connection Status: 753985
Implantable Lead Connection Status: 753985
Implantable Lead Implant Date: 20221219
Implantable Lead Implant Date: 20221219
Implantable Lead Location: 753859
Implantable Lead Location: 753860
Implantable Pulse Generator Implant Date: 20221219
Lead Channel Impedance Value: 450 Ohm
Lead Channel Impedance Value: 460 Ohm
Lead Channel Pacing Threshold Amplitude: 0.5 V
Lead Channel Pacing Threshold Amplitude: 0.625 V
Lead Channel Pacing Threshold Pulse Width: 0.5 ms
Lead Channel Pacing Threshold Pulse Width: 0.5 ms
Lead Channel Sensing Intrinsic Amplitude: 11.4 mV
Lead Channel Sensing Intrinsic Amplitude: 2.6 mV
Lead Channel Setting Pacing Amplitude: 0.875
Lead Channel Setting Pacing Amplitude: 1.5 V
Lead Channel Setting Pacing Pulse Width: 0.5 ms
Lead Channel Setting Sensing Sensitivity: 2 mV
Pulse Gen Model: 2272
Pulse Gen Serial Number: 3985814

## 2023-08-09 ENCOUNTER — Ambulatory Visit: Payer: Self-pay | Admitting: Internal Medicine

## 2023-08-17 ENCOUNTER — Ambulatory Visit: Attending: Internal Medicine | Admitting: Internal Medicine

## 2023-08-17 ENCOUNTER — Encounter: Payer: Self-pay | Admitting: Internal Medicine

## 2023-08-17 VITALS — BP 120/82 | HR 74 | Ht 72.0 in | Wt 168.6 lb

## 2023-08-17 DIAGNOSIS — I10 Essential (primary) hypertension: Secondary | ICD-10-CM

## 2023-08-17 DIAGNOSIS — I442 Atrioventricular block, complete: Secondary | ICD-10-CM | POA: Diagnosis not present

## 2023-08-17 NOTE — Patient Instructions (Signed)

## 2023-08-17 NOTE — Progress Notes (Signed)
 HPI Fernando Dean returns today for followup. 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 He also has a h/o tobacco abuse and is smoking about 15-20 a day. He has not had syncope. His fatigue is much improved since his PPM was placed. He has been sedentary however.  Allergies  Allergen Reactions   Lisinopril Anaphylaxis   Naproxen Shortness Of Breath and Palpitations   Nsaids Shortness Of Breath and Palpitations     Current Outpatient Medications  Medication Sig Dispense Refill   albuterol  (VENTOLIN  HFA) 108 (90 Base) MCG/ACT inhaler Inhale 2 puffs into the lungs every 6 (six) hours as needed for wheezing or shortness of breath. 8 g 2   amLODipine  (NORVASC ) 2.5 MG tablet TAKE 1 TABLET(2.5 MG) BY MOUTH DAILY 90 tablet 3   aspirin  EC 81 MG EC tablet Take 1 tablet (81 mg total) by mouth daily at 6 (six) AM. Swallow whole. 30 tablet 11   atorvastatin  (LIPITOR) 10 MG tablet Take 10 mg by mouth daily.     bisacodyl  (DULCOLAX) 5 MG EC tablet Take 5 mg by mouth daily as needed for moderate constipation.     carvedilol  (COREG ) 6.25 MG tablet Take 1 tablet (6.25 mg total) by mouth 2 (two) times daily with a meal. 60 tablet 3   famotidine  (PEPCID ) 20 MG tablet Take 20 mg by mouth daily as needed for heartburn or indigestion.     feeding supplement (ENSURE ENLIVE / ENSURE PLUS) LIQD Take 237 mLs by mouth 3 (three) times daily between meals. (Patient taking differently: Take 237 mLs by mouth 3 (three) times daily as needed (nutrition).) 237 mL 12   lidocaine -prilocaine  (EMLA ) cream SMARTSIG:1 Application Topical PRN     loperamide  (IMODIUM  A-D) 2 MG tablet Take 1 tablet (2 mg total) by mouth 4 (four) times daily as needed for diarrhea or loose stools.  0   nicotine  (NICODERM CQ  - DOSED IN MG/24 HOURS) 21 mg/24hr patch Place 21 mg onto the skin daily.     olmesartan (BENICAR) 5 MG tablet Take 1 tablet by mouth daily.     prochlorperazine  (COMPAZINE ) 10 MG tablet      sodium  bicarbonate 650 MG tablet Take 650 mg by mouth 3 (three) times daily.     Vitamin D, Ergocalciferol, (DRISDOL) 1.25 MG (50000 UNIT) CAPS capsule Take 50,000 Units by mouth once a week.     No current facility-administered medications for this visit.   Facility-Administered Medications Ordered in Other Visits  Medication Dose Route Frequency Provider Last Rate Last Admin   magnesium  sulfate 2 GM/50ML IVPB            palonosetron  (ALOXI ) 0.25 MG/5ML injection            sodium chloride  flush (NS) 0.9 % injection 10 mL  10 mL Intracatheter PRN Rogers Hai, MD   10 mL at 11/18/21 1257     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 - St Jude/[employer] MRI 2272 dual chamber PPM   Tuberculosis    as a child, was treated    ROS:  All systems reviewed and negative except as noted in the HPI.   Past Surgical History:  Procedure Laterality Date   ABDOMINAL AORTIC ENDOVASCULAR STENT GRAFT Bilateral 02/26/2021   Procedure: ABDOMINAL AORTIC ENDOVASCULAR STENT GRAFT REPAIR;  Surgeon: Gretta Lonni PARAS, MD;  Location: [facility] OR;  Service: Vascular;  Laterality: Bilateral;   BIOPSY  03/12/2021   Procedure: BIOPSY;  Surgeon: Eartha Angelia Sieving, MD;  [facility];  Service: Gastroenterology;;   BIOPSY  06/25/2021   Procedure: BIOPSY;  Surgeon: Eartha Angelia Sieving, MD;  [facility];  Service: Gastroenterology;;  mass    COLONOSCOPY WITH PROPOFOL  N/A 03/12/2021   Procedure: COLONOSCOPY WITH PROPOFOL ;  Surgeon: Eartha Angelia Sieving, MD;  [facility];  Service: Gastroenterology;  Laterality: N/A;  10:55   COLONOSCOPY WITH PROPOFOL  N/A 06/25/2021    Procedure: COLONOSCOPY WITH PROPOFOL ;  Surgeon: Eartha Angelia Sieving, MD;  [facility];  Service: Gastroenterology;  Laterality: N/A;  235 ASA 2   HEMOSTASIS CLIP PLACEMENT  03/12/2021   Procedure: HEMOSTASIS CLIP PLACEMENT;  Surgeon: Eartha Angelia Sieving, MD;  [facility];  Service: Gastroenterology;;   HEMOSTASIS CLIP PLACEMENT  06/25/2021   Procedure: HEMOSTASIS CLIP PLACEMENT;  Surgeon: Eartha Angelia Sieving, MD;  [facility];  Service: Gastroenterology;;   HERNIA REPAIR Left 10/25/2006   IR IMAGING GUIDED PORT INSERTION  09/29/2021   PACEMAKER IMPLANT N/A 02/08/2021   Procedure: PACEMAKER IMPLANT;  Surgeon: Fernande Elspeth BROCKS, MD;  Location: [facility] INVASIVE CV LAB;  Service: Cardiovascular;  Laterality: N/A;   POLYPECTOMY  03/12/2021   Procedure: POLYPECTOMY;  Surgeon: Eartha Angelia Sieving, MD;  [facility];  Service: Gastroenterology;;   POLYPECTOMY  06/25/2021   Procedure: POLYPECTOMY;  Surgeon: Eartha Angelia, Sieving, MD;  [facility];  Service: Gastroenterology;;   MATIAS  03/12/2021   Procedure: MATIAS;  Surgeon: Eartha Angelia, Sieving, MD;  [facility];  Service: Gastroenterology;;     Family History  Problem Relation Age of Onset   Heart disease Father    Colon polyps Sister        less than 10 lifetime polyps   Colon cancer Maternal Grandmother        dx 90s     Social History   Socioeconomic History   Marital status: Single    Spouse name: Not on file   Number of children: Not on file   Years of education: Not on file   Highest education level: Not on file  Occupational History   Not on file  Tobacco Use   Smoking status: Every Day    Current packs/day: 0.75    Average packs/day: 0.8 packs/day for 50.0 years (37.5 ttl pk-yrs)    Types: Cigarettes    Passive exposure: Current   Smokeless tobacco: Never  Vaping Use   Vaping status: Never Used  Substance and Sexual  Activity   Alcohol use: Yes    Alcohol/week: 2.0 - 3.0 standard drinks of alcohol    Types: 2 - 3 Shots of liquor per week    Comment: daily liquor moderatly   Drug use: Not on file    Comment: uses every other day   Sexual activity: Not Currently  Other Topics Concern   Not on file  Social History Narrative   Not on file   Social Drivers of Health   Financial Resource Strain: High Risk (10/14/2021)   Overall Financial Resource Strain (CARDIA)    Difficulty  of Paying Living Expenses: Hard  Food Insecurity: Not on file  Transportation Needs: Not on file  Physical Activity: Not on file  Stress: Not on file  Social Connections: Not on file  Intimate Partner Violence: Not on file     BP 120/82 (BP Location: Left Arm, Patient Position: Sitting, Cuff Size: Normal)   Pulse 74   Ht 6' (1.829 m)   Wt 168 lb 9.6 oz (76.5 kg)   SpO2 99%   BMI 22.87 kg/m   Physical Exam:  Well appearing 73 year old man, NAD HEENT: Unremarkable Neck:  No JVD, no thyromegally Lymphatics:  No adenopathy Back:  No CVA tenderness Lungs:  Clear, except for scattered rales. HEART:  Regular rate rhythm, no murmurs, no rubs, no clicks Abd:  soft, positive bowel sounds, no organomegally, no rebound, no guarding Ext:  2 plus pulses, no edema, no cyanosis, no clubbing Skin:  No rashes no nodules Neuro:  CN II through XII intact, motor grossly intact  EKG -P synchronous ventricular pacing  DEVICE  Normal device function.  See PaceArt for details.   Assess/Plan:  Heart block  - he has CHB with a slow escape today. He is now asymptomatic. PPM -his St. Jude DDD PM is working normally. We will recheck in several months. HTN - his bp is well controlled. Tobacco abuse - I encouraged him to stop smoking.   Danelle Drusilla Wampole,MD

## 2023-09-14 NOTE — Progress Notes (Signed)
 Remote pacemaker transmission.

## 2023-09-15 ENCOUNTER — Other Ambulatory Visit: Payer: Self-pay

## 2023-09-29 ENCOUNTER — Ambulatory Visit (HOSPITAL_COMMUNITY)
Admission: RE | Admit: 2023-09-29 | Discharge: 2023-09-29 | Disposition: A | Discharge status: Home / Self Care | Source: Ambulatory Visit | Attending: Hematology | Admitting: Hematology

## 2023-09-29 ENCOUNTER — Inpatient Hospital Stay: Attending: Hematology

## 2023-09-29 ENCOUNTER — Inpatient Hospital Stay

## 2023-09-29 VITALS — BP 137/74 | HR 72 | Resp 18

## 2023-09-29 DIAGNOSIS — D649 Anemia, unspecified: Secondary | ICD-10-CM | POA: Insufficient documentation

## 2023-09-29 DIAGNOSIS — E871 Hypo-osmolality and hyponatremia: Secondary | ICD-10-CM | POA: Diagnosis present

## 2023-09-29 DIAGNOSIS — K769 Liver disease, unspecified: Secondary | ICD-10-CM | POA: Diagnosis present

## 2023-09-29 DIAGNOSIS — C186 Malignant neoplasm of descending colon: Secondary | ICD-10-CM

## 2023-09-29 DIAGNOSIS — Z85038 Personal history of other malignant neoplasm of large intestine: Secondary | ICD-10-CM | POA: Insufficient documentation

## 2023-09-29 DIAGNOSIS — N261 Atrophy of kidney (terminal): Secondary | ICD-10-CM | POA: Insufficient documentation

## 2023-09-29 DIAGNOSIS — R5383 Other fatigue: Secondary | ICD-10-CM | POA: Diagnosis not present

## 2023-09-29 DIAGNOSIS — G629 Polyneuropathy, unspecified: Secondary | ICD-10-CM | POA: Insufficient documentation

## 2023-09-29 DIAGNOSIS — D509 Iron deficiency anemia, unspecified: Secondary | ICD-10-CM

## 2023-09-29 DIAGNOSIS — N189 Chronic kidney disease, unspecified: Secondary | ICD-10-CM | POA: Diagnosis not present

## 2023-09-29 LAB — CBC WITH DIFFERENTIAL/PLATELET
Abs Immature Granulocytes: 0.02 K/uL (ref 0.00–0.07)
Basophils Absolute: 0.1 K/uL (ref 0.0–0.1)
Basophils Relative: 1 %
Eosinophils Absolute: 0.5 K/uL (ref 0.0–0.5)
Eosinophils Relative: 8 %
HCT: 34.4 % — ABNORMAL LOW (ref 39.0–52.0)
Hemoglobin: 12 g/dL — ABNORMAL LOW (ref 13.0–17.0)
Immature Granulocytes: 0 %
Lymphocytes Relative: 31 %
Lymphs Abs: 1.8 K/uL (ref 0.7–4.0)
MCH: 33.9 pg (ref 26.0–34.0)
MCHC: 34.9 g/dL (ref 30.0–36.0)
MCV: 97.2 fL (ref 80.0–100.0)
Monocytes Absolute: 0.7 K/uL (ref 0.1–1.0)
Monocytes Relative: 12 %
Neutro Abs: 2.9 K/uL (ref 1.7–7.7)
Neutrophils Relative %: 48 %
Platelets: 173 K/uL (ref 150–400)
RBC: 3.54 MIL/uL — ABNORMAL LOW (ref 4.22–5.81)
RDW: 13.4 % (ref 11.5–15.5)
WBC: 6 K/uL (ref 4.0–10.5)
nRBC: 0 % (ref 0.0–0.2)

## 2023-09-29 LAB — MAGNESIUM: Magnesium: 1.3 mg/dL — ABNORMAL LOW (ref 1.7–2.4)

## 2023-09-29 LAB — COMPREHENSIVE METABOLIC PANEL WITH GFR
ALT: 26 U/L (ref 0–44)
AST: 46 U/L — ABNORMAL HIGH (ref 15–41)
Albumin: 3.7 g/dL (ref 3.5–5.0)
Alkaline Phosphatase: 121 U/L (ref 38–126)
Anion gap: 10 (ref 5–15)
BUN: 16 mg/dL (ref 8–23)
CO2: 20 mmol/L — ABNORMAL LOW (ref 22–32)
Calcium: 8.9 mg/dL (ref 8.9–10.3)
Chloride: 99 mmol/L (ref 98–111)
Creatinine, Ser: 1.53 mg/dL — ABNORMAL HIGH (ref 0.61–1.24)
GFR, Estimated: 48 mL/min — ABNORMAL LOW (ref >60)
Glucose, Bld: 87 mg/dL (ref 70–99)
Potassium: 4.1 mmol/L (ref 3.5–5.1)
Sodium: 129 mmol/L — ABNORMAL LOW (ref 135–145)
Total Bilirubin: 0.5 mg/dL (ref 0.0–1.2)
Total Protein: 7.4 g/dL (ref 6.5–8.1)

## 2023-09-29 MED ORDER — IOHEXOL 300 MG/ML  SOLN
100.0000 mL | Freq: Once | INTRAMUSCULAR | Status: AC | PRN
  Administered 2023-09-29: 100 mL via INTRAVENOUS

## 2023-09-29 MED ORDER — HEPARIN SOD (PORK) LOCK FLUSH 100 UNIT/ML IV SOLN
500.0000 [IU] | Freq: Once | INTRAVENOUS | Status: AC
  Administered 2023-09-29: 500 [IU] via INTRAVENOUS

## 2023-09-29 MED ORDER — SODIUM CHLORIDE 0.9 % IV SOLN: Freq: Once | INTRAVENOUS | Status: AC

## 2023-09-29 MED ORDER — HEPARIN SOD (PORK) LOCK FLUSH 100 UNIT/ML IV SOLN
INTRAVENOUS | Status: AC
  Filled 2023-09-29: qty 5

## 2023-09-29 NOTE — Progress Notes (Signed)
 Patient tolerated therapy with no complaints voiced.  Side effects with management reviewed with understanding verbalized.  Port site clean and dry with no bruising or swelling noted at site.  Good blood return noted before and after administration of therapy.  Band aid applied.  Patient left in satisfactory condition with VSS and no s/s of distress noted.

## 2023-09-29 NOTE — Patient Instructions (Signed)
 [facility] [place] - A DEPT OF [place]  Discharge Instructions: Thank you for choosing [facility] to provide your oncology and hematology care.  If you have a lab appointment with the Cancer Center - please note that after April 8th, 2024, all labs will be drawn in the cancer center.  You do not have to check in or register with the main entrance as you have in the past but will complete your check-in in the cancer center.  Wear comfortable clothing and clothing appropriate for easy access to any Portacath or PICC line.   We strive to give you quality time with your provider. You may need to reschedule your appointment if you arrive late (15 or more minutes).  Arriving late affects you and other patients whose appointments are after yours.  Also, if you miss three or more appointments without notifying the office, you may be dismissed from the clinic at the provider's discretion.      For prescription refill requests, have your pharmacy contact our office and allow 72 hours for refills to be completed.    Today you received the following 1 L of normal saline, return as scheduled.   To help prevent nausea and vomiting after your treatment, we encourage you to take your nausea medication as directed.  BELOW ARE SYMPTOMS THAT SHOULD BE REPORTED IMMEDIATELY: *FEVER GREATER THAN 100.4 F (38 C) OR HIGHER *CHILLS OR SWEATING *NAUSEA AND VOMITING THAT IS NOT CONTROLLED WITH YOUR NAUSEA MEDICATION *UNUSUAL SHORTNESS OF BREATH *UNUSUAL BRUISING OR BLEEDING *URINARY PROBLEMS (pain or burning when urinating, or frequent urination) *BOWEL PROBLEMS (unusual diarrhea, constipation, pain near the anus) TENDERNESS IN MOUTH AND THROAT WITH OR WITHOUT PRESENCE OF ULCERS (sore throat, sores in mouth, or a toothache) UNUSUAL RASH, SWELLING OR PAIN  UNUSUAL VAGINAL DISCHARGE OR ITCHING   Items with * indicate a potential emergency and should be followed up as soon as  possible or go to the Emergency Department if any problems should occur.  Please show the CHEMOTHERAPY ALERT CARD or IMMUNOTHERAPY ALERT CARD at check-in to the Emergency Department and triage nurse.  Should you have questions after your visit or need to cancel or reschedule your appointment, please contact [facility] [place] - A DEPT OF [facility] [phone]  and follow the prompts.  Office hours are 8:00 a.m. to 4:30 p.m. Monday - Friday. Please note that voicemails left after 4:00 p.m. may not be returned until the following business day.  We are closed weekends and major holidays. You have access to a nurse at all times for urgent questions. Please call the main number to the clinic [phone] and follow the prompts.  For any non-urgent questions, you may also contact your provider using MyChart. We now offer e-Visits for anyone 18 and older to request care online for non-urgent symptoms. For details visit [email or web address].   Also download the MyChart app! Go to the app store, search MyChart, open the app, select [place], and log in with your MyChart username and password.

## 2023-09-29 NOTE — Progress Notes (Signed)
 Fernando Dean presented for Portacath access and flush.  Portacath located right chest wall accessed with  H 20 needle.  Good blood return present. Portacath flushed with 20ml NS. Patient remains accessed for fluids and CT scan. Procedure tolerated well and without incident.

## 2023-10-03 NOTE — Progress Notes (Signed)
 Patient Care Team: Shona Norleen PEDLAR, MD as PCP - General (Internal Medicine) Debera Jayson MATSU, MD as PCP - Cardiology (Cardiology) Celestia Joesph SQUIBB, RN as Oncology Nurse Navigator (Medical Oncology)  Clinic Day:  10/04/2023  Referring physician: Aura Portal, FNP   CHIEF COMPLAINT:  CC: Colon carcinoma  Fernando Dean 73 y.o. male was transferred to my care after his prior physician has left.   ASSESSMENT & PLAN:   Assessment & Plan: Fernando Dean  is a 73 y.o. male with Stage II colon cancer  Assessment & Plan Cancer of left colon [facility]) Patient with a history of stage III(pT4apN1) right colon adenocarcinoma s/p hemicolectomy and adjuvant FOLFOX for 12 cycles.  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  CMP: Hyponatremia and CKD.  CBC: Mild anemia.  CEA: Pending - We reviewed the CT scan together.  Liver mass that is consistent with fatty infiltration is stable.  No other evidence of disease.  Next due in 6 months - Surveillance per NCCN guidelines: - H&E every 3 to 6 months for 2 years, then every 6 months for a total of 5 years - CEA every 3 to 6 months for 2 years, then every 6 months for a total of 5 years - CT AP every 6 to 12 months from date of surgery for a total of 5 years   Return to clinic in 6 months with CEA and CT abdomen and pelvis with contrast Normocytic anemia Likely secondary to chronic kidney disease Iron panel within normal limits.  TSAT less than 35  - Can consider starting oral ferrous sulfate  every other day - No indication for EPO shots at this time -If worsens, will consider multiple myeloma workup and if hemoglobin is less than 10 will consider starting EPO shots -Consider replacing iron with IV iron for a TSAT goal of 25-30 before starting EPO shots Other polyneuropathy Oxaliplatin  induced  - Stable at this time Hyponatremia Patient has known history of hyponatremia and follows with Dr. Rachele No complaints at this  time  - Continue to follow with Dr. Rachele    The patient understands the plans discussed today and is in agreement with them.  He knows to contact our office if he develops concerns prior to his next appointment.  50 minutes of total time was spent for this patient encounter, including preparation, face-to-face counseling with the patient and coordination of care, physical exam, and documentation of the encounter. > 50% of the time was spent on counseling as documented under my assessment and plan.    Mickiel Dry, MD  [facility] [place] - A DEPT OF [facility] [address] [place] [address] Dept: [phone] Dept Fax: [phone]   Orders Placed This Encounter  Procedures   CT ABDOMEN PELVIS W CONTRAST    Standing Status:   Future    Expected Date:   04/05/2024    Expiration Date:   10/03/2024    If indicated for the ordered procedure, I authorize the administration of contrast media per Radiology protocol:   Yes    Does the patient have a contrast media/X-ray dye allergy?:   Yes    Preferred imaging location?:   [facility]    If indicated for the ordered procedure, I authorize the administration of oral contrast media per Radiology protocol:   Yes   CBC with Differential    Standing Status:   Future  Expected Date:   04/01/2024    Expiration Date:   06/30/2024   Comprehensive metabolic panel    Standing Status:   Future    Expected Date:   04/01/2024    Expiration Date:   06/30/2024   CEA    Standing Status:   Future    Expected Date:   04/01/2024    Expiration Date:   06/30/2024   CEA    Standing Status:   Future    Number of Occurrences:   1    Expected Date:   10/04/2023    Expiration Date:   01/02/2024   Iron and TIBC    Standing Status:   Future    Number of Occurrences:   1    Expected Date:   10/04/2023    Expiration Date:   01/02/2024   Ferritin    Standing Status:   Future    Number of  Occurrences:   1    Expected Date:   10/04/2023    Expiration Date:   01/02/2024     ONCOLOGY HISTORY:   I have reviewed his chart and materials related to his cancer extensively and collaborated history with the patient. Summary of oncologic history is as follows:   Rectal adenocarcinoma of the splenic flexure and Stage III right colon adenocarcinoma:  -03/12/2021: Colonoscopy: Likely malignant partially obstructing tumor at 40 cm proximal to the anus.  Biopsied. -03/12/2021: CEA: 8.3 -04/08/2021: CT angio abdomen and pelvis with contrast: Stable 7 mm subpleural pulmonary nodule in the periphery of the right lower lobe. -04/23/2021: Splenic flexure, resection: Invasive moderately differentiated adenocarcinoma, 5.2 cm.  Carcinoma invades into subserosa.  Resection margins are negative for carcinoma.  38 benign lymph nodes.  MSI-stable. pT3, pN0  -06/10/2021: PET/CT: 2 cm hypermetabolic soft tissue density in transverse colon, suspicious for site of primary colon cancer.  No evidence of local or distant metastatic disease. -06/25/2021: Colonoscopy: Likely malignant tumor in the proximal transverse colon.  Biopsy.  Multiple polyps. - 06/25/2021: Colon mass biopsy: Invasive adenocarcinoma. -08/18/2021: Right colectomy by Dr. Debby -08/18/2021:MSI-stable -08/18/2021: Right colon, colectomy: Invasive colonic adenocarcinoma, 2.7 cm, carcinoma extends into pericolic connective tissue and focally involves serosa.  All surgical margins negative for carcinoma.  Metastatic carcinoma in 1 of 22 lymph nodes(1/22).  G2, moderately differentiated.  Lymphovascular invasion present.  Perineural invasion not identified.pT4a, pN1a - 10/05/2021-03/23/2022: Adjuvant FOLFOX x 12 cycles - 04/25/2022: CT abdomen and pelvis with contrast: No acute findings.  No evidence of disease. - 11/09/2022: CT abdomen and pelvis: Stable exam.  No evidence of disease.  - 06/05/2023 CT abdomen and pelvis without contrast: New  low-density lesion in the posterior segment of the left liver.  Can consistent with focal fatty deposition.  NAD.  Patient could not get an MRI because of her pacemaker. - 06/22/2023: CT abdomen with contrast: Similar appearance of segment 4 of the liver most likely representing focal fatty infiltration - 09/29/2023: CT abdomen pelvis with contrast, unchanged hypodense lesion in the liver.  No evidence of disease anywhere else.  Current Treatment:  Surveillance  INTERVAL HISTORY:   Fernando Dean is here today for follow up and transition of care to me. Patient is accompanied by his sister today.  Patient reports occasional fatigue but no other complaints today.  Peripheral neuropathy stable.  Denies loss of appetite, weight loss.  Overall is doing very well.  He sees Dr. Rachele for his chronic kidney disease and hyponatremia.  I have reviewed the past  medical history, past surgical history, social history and family history with the patient and they are unchanged from previous note.  ALLERGIES:  is allergic to lisinopril, naproxen, and nsaids.  MEDICATIONS:  Current Outpatient Medications  Medication Sig Dispense Refill   albuterol  (VENTOLIN  HFA) 108 (90 Base) MCG/ACT inhaler Inhale 2 puffs into the lungs every 6 (six) hours as needed for wheezing or shortness of breath. 8 g 2   amLODipine  (NORVASC ) 2.5 MG tablet TAKE 1 TABLET(2.5 MG) BY MOUTH DAILY 90 tablet 3   aspirin  EC 81 MG EC tablet Take 1 tablet (81 mg total) by mouth daily at 6 (six) AM. Swallow whole. 30 tablet 11   atorvastatin  (LIPITOR) 10 MG tablet Take 10 mg by mouth daily.     bisacodyl  (DULCOLAX) 5 MG EC tablet Take 5 mg by mouth daily as needed for moderate constipation.     carvedilol  (COREG ) 6.25 MG tablet Take 1 tablet (6.25 mg total) by mouth 2 (two) times daily with a meal. 60 tablet 3   clotrimazole (LOTRIMIN) 1 % cream APPLY TOPICALLY TO THE AFFECTED AND SURROUNDING AREAS TWICE DAILY IN THE MORNING AND IN THE  EVENING     famotidine  (PEPCID ) 20 MG tablet Take 20 mg by mouth daily as needed for heartburn or indigestion.     feeding supplement (ENSURE ENLIVE / ENSURE PLUS) LIQD Take 237 mLs by mouth 3 (three) times daily between meals. (Patient taking differently: Take 237 mLs by mouth 3 (three) times daily as needed (nutrition).) 237 mL 12   lidocaine -prilocaine  (EMLA ) cream SMARTSIG:1 Application Topical PRN     loperamide  (IMODIUM  A-D) 2 MG tablet Take 1 tablet (2 mg total) by mouth 4 (four) times daily as needed for diarrhea or loose stools.  0   nicotine  (NICODERM CQ  - DOSED IN MG/24 HOURS) 21 mg/24hr patch Place 21 mg onto the skin daily.     olmesartan (BENICAR) 5 MG tablet Take 1 tablet by mouth daily.     prochlorperazine  (COMPAZINE ) 10 MG tablet      sodium bicarbonate 650 MG tablet Take 650 mg by mouth 3 (three) times daily.     Vitamin D, Ergocalciferol, (DRISDOL) 1.25 MG (50000 UNIT) CAPS capsule Take 50,000 Units by mouth once a week.     No current facility-administered medications for this visit.   Facility-Administered Medications Ordered in Other Visits  Medication Dose Route Frequency Provider Last Rate Last Admin   magnesium  sulfate 2 GM/50ML IVPB            palonosetron  (ALOXI ) 0.25 MG/5ML injection            sodium chloride  flush (NS) 0.9 % injection 10 mL  10 mL Intracatheter PRN Rogers Hai, MD   10 mL at 11/18/21 1257    REVIEW OF SYSTEMS:   Constitutional: Denies fevers, chills or abnormal weight loss Eyes: Denies blurriness of vision Ears, nose, mouth, throat, and face: Denies mucositis or sore throat Respiratory: Denies cough, dyspnea or wheezes Cardiovascular: Denies palpitation, chest discomfort or lower extremity swelling Gastrointestinal:  Denies nausea, heartburn or change in bowel habits Skin: Denies abnormal skin rashes Lymphatics: Denies new lymphadenopathy or easy bruising Neurological:Denies numbness, tingling or new weaknesses Behavioral/Psych:  Mood is stable, no new changes  All other systems were reviewed with the patient and are negative.   VITALS:  Blood pressure 132/73, pulse 77, temperature 98.2 F (36.8 C), temperature source Tympanic, resp. rate 18, weight 171 lb 1.2 oz (77.6 kg), SpO2 98%.  Wt Readings from Last 3 Encounters:  10/04/23 171 lb 1.2 oz (77.6 kg)  08/17/23 168 lb 9.6 oz (76.5 kg)  06/29/23 169 lb (76.7 kg)    Body mass index is 23.2 kg/m.  Performance status (ECOG): 0 - Asymptomatic  PHYSICAL EXAM:   GENERAL:alert, no distress and comfortable SKIN: skin color, texture, turgor are normal, no rashes or significant lesions LUNGS: clear to auscultation and percussion with normal breathing effort HEART: regular rate & rhythm and no murmurs and no lower extremity edema ABDOMEN:abdomen soft, non-tender and normal bowel sounds Musculoskeletal:no cyanosis of digits and no clubbing  NEURO: alert & oriented x 3 with fluent speech, no focal motor/sensory deficits  LABORATORY DATA:  I have reviewed the data as listed     Component Value Date/Time   NA 129 (L) 09/29/2023 0753   K 4.1 09/29/2023 0753   CL 99 09/29/2023 0753   CO2 20 (L) 09/29/2023 0753   GLUCOSE 87 09/29/2023 0753   BUN 16 09/29/2023 0753   CREATININE 1.53 (H) 09/29/2023 0753   CALCIUM  8.9 09/29/2023 0753   PROT 7.4 09/29/2023 0753   PROT 7.5 05/23/2021 0000   ALBUMIN 3.7 09/29/2023 0753   ALBUMIN 4.0 05/23/2021 0000   AST 46 (H) 09/29/2023 0753   ALT 26 09/29/2023 0753   ALKPHOS 121 09/29/2023 0753   BILITOT 0.5 09/29/2023 0753   BILITOT 0.4 05/23/2021 0000   GFRNONAA 48 (L) 09/29/2023 0753   GFRAA  10/23/2006 2125    >60        The eGFR has been calculated using the MDRD equation. This calculation has not been validated in all clinical   Lab Results  Component Value Date   WBC 6.0 09/29/2023   NEUTROABS 2.9 09/29/2023   HGB 12.0 (L) 09/29/2023   HCT 34.4 (L) 09/29/2023   MCV 97.2 09/29/2023   PLT 173 09/29/2023       Chemistry      Component Value Date/Time   NA 129 (L) 09/29/2023 0753   K 4.1 09/29/2023 0753   CL 99 09/29/2023 0753   CO2 20 (L) 09/29/2023 0753   BUN 16 09/29/2023 0753   CREATININE 1.53 (H) 09/29/2023 0753      Component Value Date/Time   CALCIUM  8.9 09/29/2023 0753   ALKPHOS 121 09/29/2023 0753   AST 46 (H) 09/29/2023 0753   ALT 26 09/29/2023 0753   BILITOT 0.5 09/29/2023 0753   BILITOT 0.4 05/23/2021 0000      Latest Reference Range & Units 03/12/21 11:58 05/11/21 08:51 08/13/21 13:35 09/16/21 13:36 11/02/21 10:11 01/10/22 09:11 01/26/22 10:19 02/23/22 10:17 04/21/22 11:44 08/01/22 13:45 11/09/22 13:21 02/28/23 11:29 06/05/23 08:16  CEA 0.0 - 4.7 ng/mL 8.3 (H) 11.4 (H) 11.2 (H) 5.7 (H) 5.2 (H) 5.0 (H) 6.1 (H) 6.4 (H) 6.6 (H) 4.9 (H) 4.8 (H) 5.4 (H) 4.6  (H): Data is abnormally high   Latest Reference Range & Units 10/04/23 11:22  Iron 45 - 182 ug/dL 67  UIBC ug/dL 729  TIBC 749 - 549 ug/dL 662  Saturation Ratios 17.9 - 39.5 % 20  Ferritin 24 - 336 ng/mL 221   RADIOGRAPHIC STUDIES: I have personally reviewed the radiological images as listed and agreed with the findings in the report.  CT ABDOMEN PELVIS W WO CONTRAST CLINICAL DATA:  Follow-up liver lesion, patient with history of colon cancer * Tracking Code: BO *  EXAM: CT ABDOMEN AND PELVIS WITHOUT AND WITH CONTRAST  TECHNIQUE: Multidetector CT imaging of the abdomen  and pelvis was performed following the standard protocol before and following the bolus administration of intravenous contrast.  RADIATION DOSE REDUCTION: This exam was performed according to the departmental dose-optimization program which includes automated exposure control, adjustment of the mA and/or kV according to patient size and/or use of iterative reconstruction technique.  CONTRAST:  OMNIPAQUE  IOHEXOL  300 MG/ML  SOLN  COMPARISON:  06/22/2023, 06/05/2023, 11/05/2022 PET-CT, 06/10/2021  FINDINGS: Lower chest: No acute  abnormality.  Hepatobiliary: Unchanged hypodense lesion in hepatic segment IVB measuring 4.4 x 3.3 cm, with fatty internal attenuation (HU = -1) and scattered internal portal and hepatic veins but without overtly contrast enhancing component. No arterially hyperenhancing or otherwise suspicious liver lesions. No gallstones, gallbladder wall thickening, or biliary dilatation.  Pancreas: Unremarkable. No pancreatic ductal dilatation or surrounding inflammatory changes.  Spleen: Normal in size without significant abnormality.  Adrenals/Urinary Tract: Adrenal glands are unremarkable. Severely atrophic left kidney. Multiple simple, benign right renal cortical cysts with renal cortical scarring. No calculi or hydronephrosis. Bladder is unremarkable.  Stomach/Bowel: Stomach is within normal limits. Partial right hemicolectomy. No evidence of bowel wall thickening, distention, or inflammatory changes.  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 No enlarged abdominal or pelvic lymph nodes.  Reproductive: No mass or other significant abnormality.  Other: No abdominal wall hernia or abnormality. No ascites.  Musculoskeletal: No acute or significant osseous findings.  IMPRESSION: 1. Unchanged hypodense lesion in hepatic segment IVB measuring 4.4 x 3.3 cm, with fatty internal attenuation and scattered internal portal and hepatic veins but without overtly contrast enhancing component. This is again most consistent with focal fatty deposition. 2. No arterially hyperenhancing or otherwise suspicious liver lesions. 3. Partial right hemicolectomy. No evidence of lymphadenopathy or metastatic disease in the abdomen or pelvis. 4. Aortobiiliac stent endograft repair of abdominal aortic aneurysm. 5. Severely atrophic left kidney.  No hydronephrosis.  Electronically Signed   By: Marolyn JONETTA Jaksch M.D.   On: 10/03/2023 10:13

## 2023-10-04 ENCOUNTER — Inpatient Hospital Stay: Admitting: Oncology

## 2023-10-04 ENCOUNTER — Inpatient Hospital Stay

## 2023-10-04 VITALS — BP 132/73 | HR 77 | Temp 98.2°F | Resp 18 | Wt 171.1 lb

## 2023-10-04 DIAGNOSIS — G629 Polyneuropathy, unspecified: Secondary | ICD-10-CM | POA: Insufficient documentation

## 2023-10-04 DIAGNOSIS — C186 Malignant neoplasm of descending colon: Secondary | ICD-10-CM

## 2023-10-04 DIAGNOSIS — D649 Anemia, unspecified: Secondary | ICD-10-CM | POA: Insufficient documentation

## 2023-10-04 DIAGNOSIS — G6289 Other specified polyneuropathies: Secondary | ICD-10-CM

## 2023-10-04 DIAGNOSIS — D509 Iron deficiency anemia, unspecified: Secondary | ICD-10-CM

## 2023-10-04 DIAGNOSIS — E871 Hypo-osmolality and hyponatremia: Secondary | ICD-10-CM | POA: Diagnosis not present

## 2023-10-04 LAB — FERRITIN: Ferritin: 221 ng/mL (ref 24–336)

## 2023-10-04 LAB — IRON AND TIBC
Iron: 67 ug/dL (ref 45–182)
Saturation Ratios: 20 % (ref 17.9–39.5)
TIBC: 337 ug/dL (ref 250–450)
UIBC: 270 ug/dL

## 2023-10-04 NOTE — Patient Instructions (Addendum)
 [facility] at [facility] Discharge Instructions   You were seen and examined today by Dr. Davonna.  She reviewed the results of your lab work which are normal/stable.   She reviewed the results of your CT scan which did not show any evidence of cancer.   We will see you back in 6 months. We will repeat lab work and a CT scan prior to this visit.    Return as scheduled.    Thank you for choosing [facility] at [facility] to provide your oncology and hematology care.  To afford each patient quality time with our provider, please arrive at least 15 minutes before your scheduled appointment time.   If you have a lab appointment with the Cancer Center please come in thru the Main Entrance and check in at the main information desk.  You need to re-schedule your appointment should you arrive 10 or more minutes late.  We strive to give you quality time with our providers, and arriving late affects you and other patients whose appointments are after yours.  Also, if you no show three or more times for appointments you may be dismissed from the clinic at the providers discretion.     Again, thank you for choosing [facility].  Our hope is that these requests will decrease the amount of time that you wait before being seen by our physicians.       _____________________________________________________________  Should you have questions after your visit to [facility], please contact our office at [phone] and follow the prompts.  Our office hours are 8:00 a.m. and 4:30 p.m. Monday - Friday.  Please note that voicemails left after 4:00 p.m. may not be returned until the following business day.  We are closed weekends and major holidays.  You do have access to a nurse 24-7, just call the main number to the clinic [phone] and do not press any options, hold on the line and a nurse will answer the phone.    For prescription  refill requests, have your pharmacy contact our office and allow 72 hours.    Due to Covid, you will need to wear a mask upon entering the hospital. If you do not have a mask, a mask will be given to you at the Main Entrance upon arrival. For doctor visits, patients may have 1 support person age 45 or older with them. For treatment visits, patients can not have anyone with them due to social distancing guidelines and our immunocompromised population.

## 2023-10-04 NOTE — Assessment & Plan Note (Addendum)
 Patient with a history of stage III(pT4apN1) right colon adenocarcinoma s/p hemicolectomy and adjuvant FOLFOX for 12 cycles.  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  CMP: Hyponatremia and CKD.  CBC: Mild anemia.  CEA: Pending - We reviewed the CT scan together.  Liver mass that is consistent with fatty infiltration is stable.  No other evidence of disease.  Next due in 6 months - Surveillance per NCCN guidelines: - H&E every 3 to 6 months for 2 years, then every 6 months for a total of 5 years - CEA every 3 to 6 months for 2 years, then every 6 months for a total of 5 years - CT AP every 6 to 12 months from date of surgery for a total of 5 years   Return to clinic in 6 months with CEA and CT abdomen and pelvis with contrast

## 2023-10-04 NOTE — Assessment & Plan Note (Addendum)
 Patient has known history of hyponatremia and follows with Dr. Rachele No complaints at this time  - Continue to follow with Dr. Rachele

## 2023-10-04 NOTE — Assessment & Plan Note (Addendum)
 Oxaliplatin  induced  - Stable at this time

## 2023-10-04 NOTE — Assessment & Plan Note (Addendum)
 Likely secondary to chronic kidney disease Iron panel within normal limits.  TSAT less than 35  - Can consider starting oral ferrous sulfate  every other day - No indication for EPO shots at this time -If worsens, will consider multiple myeloma workup and if hemoglobin is less than 10 will consider starting EPO shots -Consider replacing iron with IV iron for a TSAT goal of 25-30 before starting EPO shots

## 2023-10-05 ENCOUNTER — Ambulatory Visit: Admitting: Hematology

## 2023-10-05 LAB — CEA: CEA: 5 ng/mL — ABNORMAL HIGH (ref 0.0–4.7)

## 2023-11-06 ENCOUNTER — Ambulatory Visit (INDEPENDENT_AMBULATORY_CARE_PROVIDER_SITE_OTHER): Payer: Medicare Other

## 2023-11-06 DIAGNOSIS — I442 Atrioventricular block, complete: Secondary | ICD-10-CM

## 2023-11-07 LAB — CUP PACEART REMOTE DEVICE CHECK
Battery Remaining Longevity: 92 mo
Battery Remaining Percentage: 76 %
Battery Voltage: 3.01 V
Brady Statistic AP VP Percent: 4 %
Brady Statistic AP VS Percent: 1 %
Brady Statistic AS VP Percent: 95 %
Brady Statistic AS VS Percent: 1 %
Brady Statistic RA Percent Paced: 3.3 %
Brady Statistic RV Percent Paced: 99 %
Date Time Interrogation Session: 20250915024420
Implantable Lead Connection Status: 753985
Implantable Lead Connection Status: 753985
Implantable Lead Implant Date: 20221219
Implantable Lead Implant Date: 20221219
Implantable Lead Location: 753859
Implantable Lead Location: 753860
Implantable Pulse Generator Implant Date: 20221219
Lead Channel Impedance Value: 410 Ohm
Lead Channel Impedance Value: 430 Ohm
Lead Channel Pacing Threshold Amplitude: 0.5 V
Lead Channel Pacing Threshold Amplitude: 0.625 V
Lead Channel Pacing Threshold Pulse Width: 0.5 ms
Lead Channel Pacing Threshold Pulse Width: 0.5 ms
Lead Channel Sensing Intrinsic Amplitude: 12 mV
Lead Channel Sensing Intrinsic Amplitude: 2.6 mV
Lead Channel Setting Pacing Amplitude: 0.875
Lead Channel Setting Pacing Amplitude: 1.5 V
Lead Channel Setting Pacing Pulse Width: 0.5 ms
Lead Channel Setting Sensing Sensitivity: 2 mV
Pulse Gen Model: 2272
Pulse Gen Serial Number: 3985814

## 2023-11-10 ENCOUNTER — Ambulatory Visit: Payer: Self-pay | Admitting: Internal Medicine

## 2023-11-11 NOTE — Progress Notes (Signed)
 Remote PPM Transmission

## 2023-12-11 ENCOUNTER — Other Ambulatory Visit: Payer: Self-pay

## 2023-12-11 DIAGNOSIS — I7143 Infrarenal abdominal aortic aneurysm, without rupture: Secondary | ICD-10-CM

## 2024-01-03 ENCOUNTER — Inpatient Hospital Stay: Attending: Hematology

## 2024-01-03 DIAGNOSIS — Z452 Encounter for adjustment and management of vascular access device: Secondary | ICD-10-CM | POA: Diagnosis not present

## 2024-01-03 DIAGNOSIS — Z85038 Personal history of other malignant neoplasm of large intestine: Secondary | ICD-10-CM | POA: Diagnosis present

## 2024-01-03 NOTE — Progress Notes (Signed)
 Patients port flushed without difficulty.  Good blood return noted with no bruising or swelling noted at site.  Band aid applied. No labs were ordered. VSS with discharge and left in satisfactory condition with no s/s of distress noted. All follow ups as scheduled.       Earlean Fidalgo

## 2024-01-16 ENCOUNTER — Ambulatory Visit: Admitting: Physician Assistant

## 2024-01-16 ENCOUNTER — Ambulatory Visit (INDEPENDENT_AMBULATORY_CARE_PROVIDER_SITE_OTHER)

## 2024-01-16 VITALS — BP 144/81 | HR 66 | Ht 72.0 in | Wt 171.0 lb

## 2024-01-16 DIAGNOSIS — I7143 Infrarenal abdominal aortic aneurysm, without rupture: Secondary | ICD-10-CM

## 2024-01-16 NOTE — Progress Notes (Signed)
 Office Note     CC:  follow up Requesting Provider:  Shona Norleen PEDLAR, MD  HPI: Fernando Dean is a 73 y.o. ([birth year]) male who presents for surveillance of endovascular repair of abdominal aortic aneurysm on 02/26/2021 by Dr. Gretta.  This was performed due to the presence of a 5.8 cm AAA.  He denies any new or changing abdominal or back pain.  He is also without any claudication, rest pain, or tissue loss of bilateral lower extremities.  He is on aspirin  and statin daily.  He is an everyday tobacco user.   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 - [employer] MRI 2272 dual chamber PPM   Tuberculosis    as a child, was treated    Past Surgical History:  Procedure Laterality Date   ABDOMINAL AORTIC ENDOVASCULAR STENT GRAFT Bilateral 02/26/2021   Procedure: ABDOMINAL AORTIC ENDOVASCULAR STENT GRAFT REPAIR;  Surgeon: Gretta Lonni PARAS, MD;  Location: [facility] OR;  Service: Vascular;  Laterality: Bilateral;   BIOPSY  03/12/2021   Procedure: BIOPSY;  Surgeon: Eartha Angelia Sieving, MD;  Location: [facility];  Service: Gastroenterology;;   BIOPSY  06/25/2021   Procedure: BIOPSY;  Surgeon: Eartha Angelia Sieving, MD;  Location: [facility];  Service: Gastroenterology;;  mass    COLONOSCOPY WITH PROPOFOL  N/A 03/12/2021   Procedure: COLONOSCOPY WITH PROPOFOL ;  Surgeon: Eartha Angelia Sieving, MD;  Location: [facility];  Service: Gastroenterology;  Laterality: N/A;  10:55   COLONOSCOPY WITH PROPOFOL  N/A 06/25/2021   Procedure: COLONOSCOPY WITH PROPOFOL ;  Surgeon: Eartha Angelia Sieving, MD;  Location: [facility];  Service: Gastroenterology;  Laterality: N/A;  235 ASA 2    HEMOSTASIS CLIP PLACEMENT  03/12/2021   Procedure: HEMOSTASIS CLIP PLACEMENT;  Surgeon: Eartha Angelia Sieving, MD;  [facility]: [facility];  Service: Gastroenterology;;   HEMOSTASIS CLIP PLACEMENT  06/25/2021   Procedure: HEMOSTASIS CLIP PLACEMENT;  Surgeon: Eartha Angelia Sieving, MD;  [facility]: [facility];  Service: Gastroenterology;;   HERNIA REPAIR Left 10/25/2006   IR IMAGING GUIDED PORT INSERTION  09/29/2021   PACEMAKER IMPLANT N/A 02/08/2021   Procedure: PACEMAKER IMPLANT;  Surgeon: Fernande Elspeth BROCKS, MD;  Location: [facility] INVASIVE CV LAB;  Service: Cardiovascular;  Laterality: N/A;   POLYPECTOMY  03/12/2021   Procedure: POLYPECTOMY;  Surgeon: Eartha Angelia Sieving, MD;  [facility];  Service: Gastroenterology;;   POLYPECTOMY  06/25/2021   Procedure: POLYPECTOMY;  Surgeon: Eartha Angelia Sieving, MD;  [facility]: [facility];  Service: Gastroenterology;;   MATIAS  03/12/2021   Procedure: MATIAS;  Surgeon: Eartha Angelia, Sieving, MD;  Location: [facility];  Service: Gastroenterology;;    Social History   Socioeconomic History   Marital status: Single    Spouse name: Not on file   Number of children: Not on file   Years of education: Not on file   Highest education level: Not on file  Occupational History   Not on file  Tobacco Use   Smoking status: Every Day    Current packs/day: 1.00    Average packs/day: 0.8 packs/day for 50.9 years (38.4 ttl pk-yrs)  Types: Cigarettes    Start date: 2025    Passive exposure: Current   Smokeless tobacco: Never  Vaping Use   Vaping status: Never Used  Substance and Sexual Activity   Alcohol use: Yes    Alcohol/week: 2.0 - 3.0 standard drinks of alcohol    Types: 2 - 3 Shots of liquor per week    Comment: daily liquor moderatly   Drug use: Not on file    Comment: uses every other day   Sexual activity: Not Currently  Other Topics Concern   Not on file  Social History Narrative   Not on file    Social Drivers of Health   Financial Resource Strain: High Risk (10/14/2021)   Overall Financial Resource Strain (CARDIA)    Difficulty of Paying Living Expenses: Hard  Food Insecurity: Not on file  Transportation Needs: Not on file  Physical Activity: Not on file  Stress: Not on file  Social Connections: Not on file  Intimate Partner Violence: Not on file    Family History  Problem Relation Age of Onset   Heart disease Father    Colon polyps Sister        less than 10 lifetime polyps   Colon cancer Maternal Grandmother        dx 90s    Current Outpatient Medications  Medication Sig Dispense Refill   albuterol  (VENTOLIN  HFA) 108 (90 Base) MCG/ACT inhaler Inhale 2 puffs into the lungs every 6 (six) hours as needed for wheezing or shortness of breath. 8 g 2   amLODipine  (NORVASC ) 2.5 MG tablet TAKE 1 TABLET(2.5 MG) BY MOUTH DAILY 90 tablet 3   aspirin  EC 81 MG EC tablet Take 1 tablet (81 mg total) by mouth daily at 6 (six) AM. Swallow whole. 30 tablet 11   atorvastatin  (LIPITOR) 10 MG tablet Take 10 mg by mouth daily.     bisacodyl  (DULCOLAX) 5 MG EC tablet Take 5 mg by mouth daily as needed for moderate constipation.     carvedilol  (COREG ) 6.25 MG tablet Take 1 tablet (6.25 mg total) by mouth 2 (two) times daily with a meal. 60 tablet 3   clotrimazole (LOTRIMIN) 1 % cream APPLY TOPICALLY TO THE AFFECTED AND SURROUNDING AREAS TWICE DAILY IN THE MORNING AND IN THE EVENING     famotidine  (PEPCID ) 20 MG tablet Take 20 mg by mouth daily as needed for heartburn or indigestion.     feeding supplement (ENSURE ENLIVE / ENSURE PLUS) LIQD Take 237 mLs by mouth 3 (three) times daily between meals. (Patient taking differently: Take 237 mLs by mouth 3 (three) times daily as needed (nutrition).) 237 mL 12   lidocaine -prilocaine  (EMLA ) cream SMARTSIG:1 Application Topical PRN     loperamide  (IMODIUM  A-D) 2 MG tablet Take 1 tablet (2 mg total) by mouth 4 (four) times daily as needed for diarrhea  or loose stools.  0   nicotine  (NICODERM CQ  - DOSED IN MG/24 HOURS) 21 mg/24hr patch Place 21 mg onto the skin daily.     olmesartan (BENICAR) 5 MG tablet Take 1 tablet by mouth daily.     prochlorperazine  (COMPAZINE ) 10 MG tablet      sodium bicarbonate 650 MG tablet Take 650 mg by mouth 3 (three) times daily.     Vitamin D, Ergocalciferol, (DRISDOL) 1.25 MG (50000 UNIT) CAPS capsule Take 50,000 Units by mouth once a week.     No current facility-administered medications for this visit.   Facility-Administered Medications Ordered in Other Visits  Medication Dose Route Frequency Provider Last Rate Last Admin   magnesium  sulfate 2 GM/50ML IVPB            palonosetron  (ALOXI ) 0.25 MG/5ML injection            sodium chloride  flush (NS) 0.9 % injection 10 mL  10 mL Intracatheter PRN Rogers Hai, MD   10 mL at 11/18/21 1257    Allergies  Allergen Reactions   Lisinopril Anaphylaxis   Naproxen Shortness Of Breath and Palpitations   Nsaids Shortness Of Breath and Palpitations     REVIEW OF SYSTEMS:  Negative unless noted in HPI [X]  denotes positive finding, [ ]  denotes negative finding Cardiac  Comments:  Chest pain or chest pressure:    Shortness of breath upon exertion:    Short of breath when lying flat:    Irregular heart rhythm:        Vascular    Pain in calf, thigh, or hip brought on by ambulation:    Pain in feet at night that wakes you up from your sleep:     Blood clot in your veins:    Leg swelling:         Pulmonary    Oxygen at home:    Productive cough:     Wheezing:         Neurologic    Sudden weakness in arms or legs:     Sudden numbness in arms or legs:     Sudden onset of difficulty speaking or slurred speech:    Temporary loss of vision in one eye:     Problems with dizziness:         Gastrointestinal    Blood in stool:     Vomited blood:         Genitourinary    Burning when urinating:     Blood in urine:        Psychiatric    Major  depression:         Hematologic    Bleeding problems:    Problems with blood clotting too easily:        Skin    Rashes or ulcers:        Constitutional    Fever or chills:      PHYSICAL EXAMINATION:  Vitals:   01/16/24 0909  BP: (!) 144/81  Pulse: 66  Weight: 171 lb (77.6 kg)  Height: 6' (1.829 m)    General:  WDWN in NAD; vital signs documented above Gait: Not observed HENT: WNL, normocephalic Pulmonary: normal non-labored breathing Cardiac: regular HR Abdomen: soft, NT, no masses Skin: without rashes Vascular Exam/Pulses: symmetrical radial pulses Extremities: without ischemic changes, without Gangrene , without cellulitis; without open wounds;  Musculoskeletal: no muscle wasting or atrophy  Neurologic: A&O X 3 Psychiatric:  The pt has Normal affect.   Non-Invasive Vascular Imaging:   4.5 cm AAA sac at the largest diameter No endoleak    ASSESSMENT/PLAN:: 73 y.o. male here for follow up for surveillance of EVAR  Mr. Unangst has been doing well since last office visit.  He denies any new or changing abdominal or back pain.  EVAR duplex demonstrates a 4.5 cm AAA sac at the largest diameter.  No endoleaks were noted.  He will continue aspirin  and statin daily.  We will repeat EVAR duplex in 1 year.  He will continue to follow-up with his PCP for management of chronic medical conditions including hypertension and hyperlipidemia.   Donnice Sender,  PA-C [facility] [phone]

## 2024-02-05 ENCOUNTER — Ambulatory Visit: Payer: Medicare Other

## 2024-02-05 ENCOUNTER — Other Ambulatory Visit: Payer: Self-pay | Admitting: *Deleted

## 2024-02-05 DIAGNOSIS — I442 Atrioventricular block, complete: Secondary | ICD-10-CM | POA: Diagnosis not present

## 2024-02-05 DIAGNOSIS — C183 Malignant neoplasm of hepatic flexure: Secondary | ICD-10-CM

## 2024-02-05 MED ORDER — AMLODIPINE BESYLATE 2.5 MG PO TABS: 2.5000 mg | ORAL_TABLET | Freq: Every day | ORAL | 3 refills | Status: AC

## 2024-02-06 LAB — CUP PACEART REMOTE DEVICE CHECK
Battery Remaining Longevity: 90 mo
Battery Remaining Percentage: 73 %
Battery Voltage: 3.01 V
Brady Statistic AP VP Percent: 3.9 %
Brady Statistic AP VS Percent: 1 %
Brady Statistic AS VP Percent: 95 %
Brady Statistic AS VS Percent: 1 %
Brady Statistic RA Percent Paced: 3.2 %
Brady Statistic RV Percent Paced: 99 %
Date Time Interrogation Session: 20251216084941
Implantable Lead Connection Status: 753985
Implantable Lead Connection Status: 753985
Implantable Lead Implant Date: 20221219
Implantable Lead Implant Date: 20221219
Implantable Lead Location: 753859
Implantable Lead Location: 753860
Implantable Pulse Generator Implant Date: 20221219
Lead Channel Impedance Value: 400 Ohm
Lead Channel Impedance Value: 410 Ohm
Lead Channel Pacing Threshold Amplitude: 0.375 V
Lead Channel Pacing Threshold Amplitude: 0.5 V
Lead Channel Pacing Threshold Pulse Width: 0.5 ms
Lead Channel Pacing Threshold Pulse Width: 0.5 ms
Lead Channel Sensing Intrinsic Amplitude: 11.4 mV
Lead Channel Sensing Intrinsic Amplitude: 2.1 mV
Lead Channel Setting Pacing Amplitude: 0.75 V
Lead Channel Setting Pacing Amplitude: 1.375
Lead Channel Setting Pacing Pulse Width: 0.5 ms
Lead Channel Setting Sensing Sensitivity: 2 mV
Pulse Gen Model: 2272
Pulse Gen Serial Number: 3985814

## 2024-02-09 NOTE — Progress Notes (Signed)
 Remote PPM Transmission

## 2024-02-16 ENCOUNTER — Ambulatory Visit: Payer: Self-pay | Admitting: Student in an Organized Health Care Education/Training Program

## 2024-02-19 ENCOUNTER — Encounter: Payer: Self-pay | Admitting: *Deleted

## 2024-03-29 ENCOUNTER — Encounter: Payer: Self-pay | Admitting: Oncology

## 2024-04-01 ENCOUNTER — Ambulatory Visit (HOSPITAL_COMMUNITY)

## 2024-04-01 ENCOUNTER — Inpatient Hospital Stay: Attending: Hematology

## 2024-04-10 ENCOUNTER — Inpatient Hospital Stay: Admitting: Oncology

## 2024-08-05 ENCOUNTER — Encounter

## 2024-11-04 ENCOUNTER — Encounter

## 2025-02-03 ENCOUNTER — Encounter

## 2025-05-05 ENCOUNTER — Encounter
